# Patient Record
Sex: Female | Born: 1945
Health system: Southern US, Community
[De-identification: ages and names within clinical notes are randomized; demographics above are authoritative.]

## PROBLEM LIST (undated history)

## (undated) DIAGNOSIS — Z9889 Other specified postprocedural states: Secondary | ICD-10-CM

## (undated) DIAGNOSIS — Z923 Personal history of irradiation: Secondary | ICD-10-CM

## (undated) DIAGNOSIS — G4700 Insomnia, unspecified: Secondary | ICD-10-CM

## (undated) DIAGNOSIS — Z8489 Family history of other specified conditions: Secondary | ICD-10-CM

## (undated) DIAGNOSIS — K219 Gastro-esophageal reflux disease without esophagitis: Secondary | ICD-10-CM

## (undated) DIAGNOSIS — R7301 Impaired fasting glucose: Secondary | ICD-10-CM

## (undated) DIAGNOSIS — C50412 Malignant neoplasm of upper-outer quadrant of left female breast: Secondary | ICD-10-CM

## (undated) DIAGNOSIS — D351 Benign neoplasm of parathyroid gland: Secondary | ICD-10-CM

## (undated) DIAGNOSIS — Z720 Tobacco use: Secondary | ICD-10-CM

## (undated) DIAGNOSIS — Z1211 Encounter for screening for malignant neoplasm of colon: Secondary | ICD-10-CM

## (undated) DIAGNOSIS — IMO0002 Reserved for concepts with insufficient information to code with codable children: Secondary | ICD-10-CM

## (undated) DIAGNOSIS — M159 Polyosteoarthritis, unspecified: Secondary | ICD-10-CM

## (undated) DIAGNOSIS — F329 Major depressive disorder, single episode, unspecified: Secondary | ICD-10-CM

## (undated) DIAGNOSIS — K819 Cholecystitis, unspecified: Secondary | ICD-10-CM

## (undated) DIAGNOSIS — E785 Hyperlipidemia, unspecified: Secondary | ICD-10-CM

## (undated) DIAGNOSIS — Z780 Asymptomatic menopausal state: Secondary | ICD-10-CM

## (undated) DIAGNOSIS — C50919 Malignant neoplasm of unspecified site of unspecified female breast: Secondary | ICD-10-CM

## (undated) DIAGNOSIS — M75100 Unspecified rotator cuff tear or rupture of unspecified shoulder, not specified as traumatic: Secondary | ICD-10-CM

## (undated) DIAGNOSIS — F419 Anxiety disorder, unspecified: Secondary | ICD-10-CM

## (undated) DIAGNOSIS — I2510 Atherosclerotic heart disease of native coronary artery without angina pectoris: Secondary | ICD-10-CM

## (undated) DIAGNOSIS — M109 Gout, unspecified: Secondary | ICD-10-CM

## (undated) DIAGNOSIS — M810 Age-related osteoporosis without current pathological fracture: Secondary | ICD-10-CM

## (undated) DIAGNOSIS — N189 Chronic kidney disease, unspecified: Secondary | ICD-10-CM

## (undated) DIAGNOSIS — I10 Essential (primary) hypertension: Secondary | ICD-10-CM

## (undated) DIAGNOSIS — C801 Malignant (primary) neoplasm, unspecified: Secondary | ICD-10-CM

## (undated) DIAGNOSIS — H269 Unspecified cataract: Secondary | ICD-10-CM

## (undated) DIAGNOSIS — Z87891 Personal history of nicotine dependence: Principal | ICD-10-CM

## (undated) DIAGNOSIS — I739 Peripheral vascular disease, unspecified: Secondary | ICD-10-CM

## (undated) DIAGNOSIS — F32A Depression, unspecified: Secondary | ICD-10-CM

## (undated) DIAGNOSIS — M1990 Unspecified osteoarthritis, unspecified site: Secondary | ICD-10-CM

## (undated) HISTORY — PX: SKIN BIOPSY: SHX1

## (undated) HISTORY — DX: Unspecified osteoarthritis, unspecified site: M19.90

## (undated) HISTORY — DX: Atherosclerotic heart disease of native coronary artery without angina pectoris: I25.10

## (undated) HISTORY — DX: Asymptomatic menopausal state: Z78.0

## (undated) HISTORY — DX: Chronic kidney disease, unspecified: N18.9

## (undated) HISTORY — DX: Reserved for concepts with insufficient information to code with codable children: IMO0002

## (undated) HISTORY — DX: Unspecified cataract: H26.9

## (undated) HISTORY — PX: BREAST EXCISIONAL BIOPSY: SUR124

## (undated) HISTORY — DX: Cholecystitis, unspecified: K81.9

## (undated) HISTORY — DX: Age-related osteoporosis without current pathological fracture: M81.0

## (undated) HISTORY — PX: BREAST FIBROADENOMA SURGERY: SHX580

## (undated) HISTORY — DX: Peripheral vascular disease, unspecified: I73.9

## (undated) HISTORY — DX: Gastro-esophageal reflux disease without esophagitis: K21.9

## (undated) HISTORY — PX: BREAST LUMPECTOMY: SHX2

## (undated) HISTORY — DX: Hyperlipidemia, unspecified: E78.5

## (undated) HISTORY — PX: ILIAC ARTERY STENT: SHX1786

## (undated) HISTORY — DX: Encounter for screening for malignant neoplasm of colon: Z12.11

## (undated) HISTORY — DX: Insomnia, unspecified: G47.00

## (undated) HISTORY — DX: Malignant neoplasm of upper-outer quadrant of left female breast: C50.412

## (undated) HISTORY — DX: Polyosteoarthritis, unspecified: M15.9

## (undated) HISTORY — DX: Anxiety disorder, unspecified: F41.9

## (undated) HISTORY — DX: Depression, unspecified: F32.A

## (undated) HISTORY — DX: Unspecified rotator cuff tear or rupture of unspecified shoulder, not specified as traumatic: M75.100

## (undated) HISTORY — DX: Tobacco use: Z72.0

## (undated) HISTORY — DX: Major depressive disorder, single episode, unspecified: F32.9

## (undated) HISTORY — DX: Other specified postprocedural states: Z98.890

## (undated) HISTORY — DX: Essential (primary) hypertension: I10

## (undated) HISTORY — DX: Gout, unspecified: M10.9

## (undated) HISTORY — DX: Benign neoplasm of parathyroid gland: D35.1

## (undated) HISTORY — DX: Personal history of nicotine dependence: Z87.891

## (undated) HISTORY — DX: Impaired fasting glucose: R73.01

---
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
## 2003-10-26 ENCOUNTER — Inpatient Hospital Stay (HOSPITAL_COMMUNITY): Admission: EM | Admit: 2003-10-26 | Discharge: 2003-10-28 | Payer: Self-pay | Admitting: Emergency Medicine

## 2007-09-16 ENCOUNTER — Other Ambulatory Visit: Payer: Self-pay

## 2007-09-17 ENCOUNTER — Inpatient Hospital Stay: Payer: Self-pay | Admitting: Internal Medicine

## 2007-09-17 HISTORY — PX: CARDIAC CATHETERIZATION: SHX172

## 2008-08-26 ENCOUNTER — Ambulatory Visit: Payer: Self-pay | Admitting: Nephrology

## 2009-12-24 ENCOUNTER — Ambulatory Visit: Payer: Self-pay | Admitting: Family Medicine

## 2009-12-24 ENCOUNTER — Emergency Department: Payer: Self-pay | Admitting: Emergency Medicine

## 2010-04-14 ENCOUNTER — Ambulatory Visit: Payer: Self-pay | Admitting: Internal Medicine

## 2010-06-08 ENCOUNTER — Ambulatory Visit: Payer: Self-pay | Admitting: Nephrology

## 2010-08-28 HISTORY — PX: PARATHYROIDECTOMY: SHX19

## 2010-09-07 ENCOUNTER — Encounter: Payer: Self-pay | Admitting: Gastroenterology

## 2010-09-29 NOTE — Letter (Signed)
Summary: Colonoscopy Letter  [facility]  [address] [place], [place] [address]   Phone: [phone]  Fax: [phone]      September 07, 2010 MRN: [id number]   [facility] HALL [address] [place]  [address]   Dear Ms. HALL,   According to your medical record, it is time for you to schedule a Colonoscopy. The American Cancer Society recommends this procedure as a method to detect early colon cancer. Patients with a family history of colon cancer, or a personal history of colon polyps or inflammatory bowel disease are at increased risk.  This letter has been generated based on the recommendations made at the time of your procedure. If you feel that in your particular situation this may no longer apply, please contact our office.  Please call our office at [phone] to schedule this appointment or to update your records at your earliest convenience.  Thank you for cooperating with [place] to provide you with the very best care possible.   Sincerely,  Judie Petit T. Russella Dar, M.D.  [facility] Gastro[facility]ology Division [phone]

## 2010-12-16 ENCOUNTER — Ambulatory Visit: Payer: Self-pay | Admitting: Nephrology

## 2011-12-19 ENCOUNTER — Ambulatory Visit: Payer: Self-pay | Admitting: Family Medicine

## 2012-05-13 ENCOUNTER — Encounter: Payer: Self-pay | Admitting: Gastroenterology

## 2012-08-28 HISTORY — PX: BREAST BIOPSY: SHX20

## 2012-10-03 ENCOUNTER — Emergency Department: Payer: Self-pay | Admitting: Emergency Medicine

## 2012-10-03 LAB — CK TOTAL AND CKMB (NOT AT ARMC): CK-MB: 1.5 ng/mL (ref 0.5–3.6)

## 2012-10-03 LAB — BASIC METABOLIC PANEL
Anion Gap: 7 (ref 7–16)
BUN: 13 mg/dL (ref 7–18)
Creatinine: 1.07 mg/dL (ref 0.60–1.30)
EGFR (Non-African Amer.): 54 — ABNORMAL LOW
Glucose: 106 mg/dL — ABNORMAL HIGH (ref 65–99)
Potassium: 3.6 mmol/L (ref 3.5–5.1)
Sodium: 142 mmol/L (ref 136–145)

## 2012-10-03 LAB — LIPASE, BLOOD: Lipase: 187 U/L (ref 73–393)

## 2012-10-03 LAB — TROPONIN I: Troponin-I: <0.02 ng/mL

## 2012-10-03 LAB — CBC
HCT: 40.1 % (ref 35.0–47.0)
MCHC: 33.4 g/dL (ref 32.0–36.0)
RDW: 14.2 % (ref 11.5–14.5)

## 2012-10-03 LAB — HEPATIC FUNCTION PANEL A (ARMC)
Bilirubin, Direct: 0.1 mg/dL (ref 0.00–0.20)
Total Protein: 7.5 g/dL (ref 6.4–8.2)

## 2012-10-09 ENCOUNTER — Ambulatory Visit: Payer: Self-pay | Admitting: Family Medicine

## 2012-11-26 DIAGNOSIS — Z9889 Other specified postprocedural states: Secondary | ICD-10-CM

## 2012-11-26 HISTORY — DX: Other specified postprocedural states: Z98.890

## 2012-12-02 ENCOUNTER — Ambulatory Visit: Payer: Self-pay | Admitting: Gastroenterology

## 2012-12-06 ENCOUNTER — Ambulatory Visit: Payer: Self-pay | Admitting: Gastroenterology

## 2013-01-17 ENCOUNTER — Ambulatory Visit: Payer: Self-pay | Admitting: Gastroenterology

## 2013-01-17 LAB — CREATININE, SERUM
Creatinine: 1.08 mg/dL (ref 0.60–1.30)
EGFR (African American): >60
EGFR (Non-African Amer.): 54 — ABNORMAL LOW

## 2013-01-20 ENCOUNTER — Emergency Department: Payer: Self-pay | Admitting: Emergency Medicine

## 2013-01-20 LAB — CBC
HCT: 38.7 % (ref 35.0–47.0)
MCH: 30.7 pg (ref 26.0–34.0)
MCV: 91 fL (ref 80–100)
Platelet: 157 10*3/uL (ref 150–440)
RBC: 4.27 10*6/uL (ref 3.80–5.20)
WBC: 5.2 10*3/uL (ref 3.6–11.0)

## 2013-01-20 LAB — BASIC METABOLIC PANEL
Chloride: 109 mmol/L — ABNORMAL HIGH (ref 98–107)
Co2: 24 mmol/L (ref 21–32)
Creatinine: 1.05 mg/dL (ref 0.60–1.30)
EGFR (African American): >60
Sodium: 141 mmol/L (ref 136–145)

## 2013-01-20 LAB — CK TOTAL AND CKMB (NOT AT ARMC)
CK, Total: 106 U/L (ref 21–215)
CK-MB: 0.9 ng/mL (ref 0.5–3.6)

## 2013-01-20 LAB — TROPONIN I: Troponin-I: <0.02 ng/mL

## 2013-01-22 ENCOUNTER — Telehealth: Payer: Self-pay

## 2013-01-22 ENCOUNTER — Encounter: Payer: Self-pay | Admitting: Cardiovascular Disease

## 2013-01-22 ENCOUNTER — Ambulatory Visit (INDEPENDENT_AMBULATORY_CARE_PROVIDER_SITE_OTHER): Payer: Medicare Other | Admitting: Cardiovascular Disease

## 2013-01-22 VITALS — BP 140/70 | HR 64 | Ht 60.0 in | Wt 138.2 lb

## 2013-01-22 DIAGNOSIS — R079 Chest pain, unspecified: Secondary | ICD-10-CM

## 2013-01-22 DIAGNOSIS — M25512 Pain in left shoulder: Secondary | ICD-10-CM

## 2013-01-22 DIAGNOSIS — M25519 Pain in unspecified shoulder: Secondary | ICD-10-CM

## 2013-01-22 DIAGNOSIS — A048 Other specified bacterial intestinal infections: Secondary | ICD-10-CM

## 2013-01-22 DIAGNOSIS — I739 Peripheral vascular disease, unspecified: Secondary | ICD-10-CM

## 2013-01-22 DIAGNOSIS — K219 Gastro-esophageal reflux disease without esophagitis: Secondary | ICD-10-CM

## 2013-01-22 DIAGNOSIS — R1013 Epigastric pain: Secondary | ICD-10-CM

## 2013-01-22 DIAGNOSIS — I2510 Atherosclerotic heart disease of native coronary artery without angina pectoris: Secondary | ICD-10-CM

## 2013-01-22 MED ORDER — RANITIDINE HCL 150 MG PO TABS: 300.0000 mg | ORAL_TABLET | Freq: Two times a day (BID) | ORAL | Status: DC

## 2013-01-22 NOTE — Assessment & Plan Note (Signed)
She'll try extra H2 blockers in addition to proton pump inhibitors

## 2013-01-22 NOTE — Assessment & Plan Note (Signed)
Recent episode of chest burning with negative workup. Cardiac catheterization several years ago showing only to 50% LAD disease. Symptoms are atypical. No further workup at this time

## 2013-01-22 NOTE — Assessment & Plan Note (Signed)
Previous iliac artery stent. Nonsmoker, currently on a statin. We have suggested she do a short trial period holding aspirin and statin to see if her epigastric discomfort improves, if no improvement, restart medications. Given her PVD, this does raise the concern of mesenteric ischemia as a cause of her symptoms.

## 2013-01-22 NOTE — Assessment & Plan Note (Signed)
She reports remote history of H. Pylori positive. We'll recheck today. If positive, may benefit from treatment despite lack of dramatic pathology given her persistent symptoms.

## 2013-01-22 NOTE — Progress Notes (Signed)
Patient ID: RIA REDCAY, female    DOB: 08-20-1946, 67 y.o.   MRN: [id number]  HPI Comments: Marie Parker is a very pleasant 67 year old woman with long history of GERD, previously evaluated by GI, status post EGD, recent evaluation in the emergency room x2 over the past 2 months for chest pain symptoms who presents today for evaluation. She has a long history of smoking and stopped 12 years ago Notes indicate stent to the right iliac artery in 1993, 1997 Cardiac catheterization in 2009 showing 40% LAD disease  She was first treated to the emergency room one month ago, she had a stabbing in her back radiating through her to her xiphoid region. Workup was essentially negative Second episode several days ago, she presented to the emergency room with burning from one shoulder to the other shoulder. Workup again essentially negative  She has chronic episodes of burning from her xiphoid region extending to the right typically presenting after eating 30-45 minutes later. Symptoms can persist for some time. She has tried several proton pump inhibitors with moderate success. H2 blockers might help her symptoms. She has had EGD, CT scan, gallbladder nuclear scan. Recently given levsin which she has taken on several occasions with no significant improvement of her symptoms. She does report having positive H. pylori and for this reason she was sent to GI for workup. No antibiotics were given.  Total cholesterol 130, LDL 50, HDL 42  EKG shows normal sinus rhythm with rate 64 beats per minute, left bundle branch block    Outpatient Encounter Prescriptions as of 01/22/2013  Medication Sig Dispense Refill  . amLODipine (NORVASC) 10 MG tablet Take 10 mg by mouth daily.      [name] aspirin 81 MG tablet Take 81 mg by mouth daily.      [name] atenolol (TENORMIN) 50 MG tablet Take 50 mg by mouth daily.      [name] atorvastatin (LIPITOR) 40 MG tablet Take 40 mg by mouth daily.      . benazepril (LOTENSIN) 40 MG tablet  Take 40 mg by mouth daily.      . clonazePAM (KLONOPIN) 0.5 MG tablet Take 0.5 mg by mouth daily as needed for anxiety.      . colchicine 0.6 MG tablet Take 0.6 mg by mouth as needed.       . fluticasone (FLONASE) 50 MCG/ACT nasal spray Place 2 sprays into the nose daily.      . hydrALAZINE (APRESOLINE) 25 MG tablet Take 25 mg by mouth 2 (two) times daily.      [name] NIACIN PO Take 1,000 mg by mouth daily.      . pantoprazole (PROTONIX) 20 MG tablet 2 (two) times daily.       .  ranitidine (ZANTAC) 150 MG tablet at bedtime.         Review of Systems  Constitutional: Negative.   HENT: Negative.   Eyes: Negative.   Respiratory: Negative.   Cardiovascular: Negative.   Gastrointestinal: Negative.        Abdominal burning after eating  Musculoskeletal: Negative.   Skin: Negative.   Neurological: Negative.   Psychiatric/Behavioral: Negative.   All other systems reviewed and are negative.    BP 140/70  Pulse 64  Ht 5' (1.524 m)  Wt 138 lb 4 oz (62.71 kg)  BMI 27 kg/m2  Physical Exam  Nursing note and vitals reviewed. Constitutional: She is oriented to person, place, and time. She appears well-developed and well-nourished.  HENT:  Head: Normocephalic.  Nose: Nose normal.  Mouth/Throat: Oropharynx is clear and moist.  Eyes: Conjunctivae are normal. Pupils are equal, round, and reactive to light.  Neck: Normal range of motion. Neck supple. No JVD present.  Cardiovascular: Normal rate, regular rhythm, S1 normal, S2 normal, normal heart sounds and intact distal pulses.  Exam reveals no gallop and no friction rub.   No murmur heard. Pulmonary/Chest: Effort normal and breath sounds normal. No respiratory distress. She has no wheezes. She has no rales. She exhibits no tenderness.  Abdominal: Soft. Bowel sounds are normal. She exhibits no distension. There is no tenderness.  Musculoskeletal: Normal range of motion. She exhibits no edema and no tenderness.  Lymphadenopathy:    She has no  cervical adenopathy.  Neurological: She is alert and oriented to person, place, and time. Coordination normal.  Skin: Skin is warm and dry. No rash noted. No erythema.  Psychiatric: She has a normal mood and affect. Her behavior is normal. Judgment and thought content normal.    Assessment and Plan

## 2013-01-22 NOTE — Assessment & Plan Note (Signed)
Cardiac catheterization in 2009 showing 40-50% mid LAD disease. Good exercise tolerance

## 2013-01-22 NOTE — Telephone Encounter (Signed)
Pt was seen in ER "Schedule pt for office visit to discuss CP symptoms" V.O Dr. Alvis Lemmings, RN, BSN

## 2013-01-22 NOTE — Patient Instructions (Addendum)
Please increase the zantac to 300 mg twice a day  Continue on the protonix  We will check the H Pylori today We will call you with the result.   No further heart studies are needed Hold the aspirin until stomach is better  Please call us if you have new issues that need to be addressed before your next appt.

## 2013-01-22 NOTE — Assessment & Plan Note (Signed)
Most of her visit was spent discussing her epigastric burning pain. Some relief with Zantac which is not taking on a regular basis. I suggested she take her Zantac on a regular basis twice a day in addition to her PPI. H. pylori will be rechecked as she reports this was positive in the past. If positive, uncertain if he should be treated given her persistent symptoms.  Given her peripheral vascular disease, significant concern for mesenteric ischemia. If no improvement of her symptoms, we will schedule her for mesenteric arterial ultrasound which could be done through our office.

## 2013-01-24 ENCOUNTER — Telehealth: Payer: Self-pay

## 2013-01-24 NOTE — Telephone Encounter (Signed)
Pt  Needs lab results

## 2013-01-24 NOTE — Telephone Encounter (Signed)
Please review

## 2013-01-28 NOTE — Telephone Encounter (Signed)
Please inform pt

## 2013-01-28 NOTE — Telephone Encounter (Signed)
H. pylori negative Perhaps we can send this to her primary care physician

## 2013-01-29 NOTE — Telephone Encounter (Signed)
LMOM TCB regarding lab work.

## 2013-02-03 ENCOUNTER — Telehealth: Payer: Self-pay

## 2013-02-03 NOTE — Telephone Encounter (Signed)
Pt is returning your call regarding her labs 

## 2013-05-28 ENCOUNTER — Ambulatory Visit: Payer: Self-pay | Admitting: Family Medicine

## 2013-05-31 DIAGNOSIS — R1030 Lower abdominal pain, unspecified: Secondary | ICD-10-CM | POA: Insufficient documentation

## 2013-05-31 DIAGNOSIS — R109 Unspecified abdominal pain: Secondary | ICD-10-CM | POA: Insufficient documentation

## 2013-06-12 ENCOUNTER — Ambulatory Visit: Payer: Self-pay | Admitting: Family Medicine

## 2013-06-17 ENCOUNTER — Ambulatory Visit: Payer: Self-pay | Admitting: Family Medicine

## 2013-06-18 LAB — PATHOLOGY REPORT

## 2014-09-01 DIAGNOSIS — M9906 Segmental and somatic dysfunction of lower extremity: Secondary | ICD-10-CM | POA: Diagnosis not present

## 2014-09-01 DIAGNOSIS — M9905 Segmental and somatic dysfunction of pelvic region: Secondary | ICD-10-CM | POA: Diagnosis not present

## 2014-09-01 DIAGNOSIS — M9904 Segmental and somatic dysfunction of sacral region: Secondary | ICD-10-CM | POA: Diagnosis not present

## 2014-09-01 DIAGNOSIS — M25551 Pain in right hip: Secondary | ICD-10-CM | POA: Diagnosis not present

## 2014-09-03 DIAGNOSIS — E780 Pure hypercholesterolemia: Secondary | ICD-10-CM | POA: Diagnosis not present

## 2014-09-03 DIAGNOSIS — N183 Chronic kidney disease, stage 3 (moderate): Secondary | ICD-10-CM | POA: Diagnosis not present

## 2014-09-03 DIAGNOSIS — I10 Essential (primary) hypertension: Secondary | ICD-10-CM | POA: Diagnosis not present

## 2014-09-09 ENCOUNTER — Ambulatory Visit: Payer: Self-pay | Admitting: Family Medicine

## 2014-09-09 DIAGNOSIS — M8589 Other specified disorders of bone density and structure, multiple sites: Secondary | ICD-10-CM | POA: Diagnosis not present

## 2014-09-09 DIAGNOSIS — M8580 Other specified disorders of bone density and structure, unspecified site: Secondary | ICD-10-CM | POA: Diagnosis not present

## 2014-09-09 DIAGNOSIS — Z1231 Encounter for screening mammogram for malignant neoplasm of breast: Secondary | ICD-10-CM | POA: Diagnosis not present

## 2014-09-09 DIAGNOSIS — Z13820 Encounter for screening for osteoporosis: Secondary | ICD-10-CM | POA: Diagnosis not present

## 2014-09-10 ENCOUNTER — Ambulatory Visit: Payer: Self-pay | Admitting: Nephrology

## 2014-09-10 DIAGNOSIS — N183 Chronic kidney disease, stage 3 (moderate): Secondary | ICD-10-CM | POA: Diagnosis not present

## 2014-09-10 DIAGNOSIS — N281 Cyst of kidney, acquired: Secondary | ICD-10-CM | POA: Diagnosis not present

## 2014-09-10 DIAGNOSIS — N2889 Other specified disorders of kidney and ureter: Secondary | ICD-10-CM | POA: Diagnosis not present

## 2014-09-16 ENCOUNTER — Ambulatory Visit: Payer: Self-pay | Admitting: Family Medicine

## 2014-09-16 DIAGNOSIS — M25551 Pain in right hip: Secondary | ICD-10-CM | POA: Diagnosis not present

## 2014-09-23 ENCOUNTER — Ambulatory Visit: Payer: Self-pay | Admitting: Family Medicine

## 2014-09-23 DIAGNOSIS — J329 Chronic sinusitis, unspecified: Secondary | ICD-10-CM | POA: Diagnosis not present

## 2014-09-23 DIAGNOSIS — E780 Pure hypercholesterolemia: Secondary | ICD-10-CM | POA: Diagnosis not present

## 2014-09-24 DIAGNOSIS — H10013 Acute follicular conjunctivitis, bilateral: Secondary | ICD-10-CM | POA: Diagnosis not present

## 2014-09-24 DIAGNOSIS — H2513 Age-related nuclear cataract, bilateral: Secondary | ICD-10-CM | POA: Diagnosis not present

## 2014-09-28 DIAGNOSIS — B019 Varicella without complication: Secondary | ICD-10-CM | POA: Diagnosis not present

## 2014-09-28 DIAGNOSIS — R05 Cough: Secondary | ICD-10-CM | POA: Diagnosis not present

## 2014-09-28 DIAGNOSIS — M545 Low back pain: Secondary | ICD-10-CM | POA: Diagnosis not present

## 2014-10-22 DIAGNOSIS — E780 Pure hypercholesterolemia: Secondary | ICD-10-CM | POA: Diagnosis not present

## 2014-10-22 DIAGNOSIS — I10 Essential (primary) hypertension: Secondary | ICD-10-CM | POA: Diagnosis not present

## 2014-10-22 DIAGNOSIS — N183 Chronic kidney disease, stage 3 (moderate): Secondary | ICD-10-CM | POA: Diagnosis not present

## 2014-10-27 DIAGNOSIS — M2550 Pain in unspecified joint: Secondary | ICD-10-CM | POA: Diagnosis not present

## 2014-10-27 DIAGNOSIS — N760 Acute vaginitis: Secondary | ICD-10-CM | POA: Diagnosis not present

## 2014-10-27 DIAGNOSIS — B372 Candidiasis of skin and nail: Secondary | ICD-10-CM | POA: Diagnosis not present

## 2014-10-27 DIAGNOSIS — R300 Dysuria: Secondary | ICD-10-CM | POA: Diagnosis not present

## 2014-10-27 DIAGNOSIS — J069 Acute upper respiratory infection, unspecified: Secondary | ICD-10-CM | POA: Diagnosis not present

## 2014-12-09 DIAGNOSIS — Z08 Encounter for follow-up examination after completed treatment for malignant neoplasm: Secondary | ICD-10-CM | POA: Diagnosis not present

## 2014-12-09 DIAGNOSIS — L853 Xerosis cutis: Secondary | ICD-10-CM | POA: Diagnosis not present

## 2014-12-09 DIAGNOSIS — L821 Other seborrheic keratosis: Secondary | ICD-10-CM | POA: Diagnosis not present

## 2014-12-09 DIAGNOSIS — Z85828 Personal history of other malignant neoplasm of skin: Secondary | ICD-10-CM | POA: Diagnosis not present

## 2014-12-09 DIAGNOSIS — Z1283 Encounter for screening for malignant neoplasm of skin: Secondary | ICD-10-CM | POA: Diagnosis not present

## 2014-12-11 DIAGNOSIS — E785 Hyperlipidemia, unspecified: Secondary | ICD-10-CM | POA: Diagnosis not present

## 2014-12-11 DIAGNOSIS — Z23 Encounter for immunization: Secondary | ICD-10-CM | POA: Diagnosis not present

## 2014-12-11 DIAGNOSIS — F419 Anxiety disorder, unspecified: Secondary | ICD-10-CM | POA: Diagnosis not present

## 2014-12-11 DIAGNOSIS — N183 Chronic kidney disease, stage 3 (moderate): Secondary | ICD-10-CM | POA: Diagnosis not present

## 2014-12-11 DIAGNOSIS — I129 Hypertensive chronic kidney disease with stage 1 through stage 4 chronic kidney disease, or unspecified chronic kidney disease: Secondary | ICD-10-CM | POA: Diagnosis not present

## 2014-12-11 DIAGNOSIS — M109 Gout, unspecified: Secondary | ICD-10-CM | POA: Diagnosis not present

## 2014-12-11 DIAGNOSIS — Z113 Encounter for screening for infections with a predominantly sexual mode of transmission: Secondary | ICD-10-CM | POA: Diagnosis not present

## 2014-12-18 ENCOUNTER — Other Ambulatory Visit: Payer: Self-pay | Admitting: Family Medicine

## 2014-12-18 DIAGNOSIS — I10 Essential (primary) hypertension: Secondary | ICD-10-CM

## 2014-12-18 DIAGNOSIS — IMO0001 Reserved for inherently not codable concepts without codable children: Secondary | ICD-10-CM

## 2015-01-05 ENCOUNTER — Ambulatory Visit
Admission: RE | Admit: 2015-01-05 | Discharge: 2015-01-05 | Disposition: A | Discharge status: Home / Self Care | Payer: Medicare Other | Source: Ambulatory Visit | Attending: Family Medicine | Admitting: Family Medicine

## 2015-01-05 DIAGNOSIS — N281 Cyst of kidney, acquired: Secondary | ICD-10-CM | POA: Diagnosis not present

## 2015-01-05 DIAGNOSIS — I10 Essential (primary) hypertension: Secondary | ICD-10-CM | POA: Insufficient documentation

## 2015-01-05 DIAGNOSIS — Z8249 Family history of ischemic heart disease and other diseases of the circulatory system: Secondary | ICD-10-CM | POA: Insufficient documentation

## 2015-01-05 DIAGNOSIS — I77811 Abdominal aortic ectasia: Secondary | ICD-10-CM | POA: Diagnosis not present

## 2015-01-05 DIAGNOSIS — IMO0001 Reserved for inherently not codable concepts without codable children: Secondary | ICD-10-CM

## 2015-01-13 DIAGNOSIS — M461 Sacroiliitis, not elsewhere classified: Secondary | ICD-10-CM | POA: Diagnosis not present

## 2015-01-13 DIAGNOSIS — M9905 Segmental and somatic dysfunction of pelvic region: Secondary | ICD-10-CM | POA: Diagnosis not present

## 2015-01-13 DIAGNOSIS — M531 Cervicobrachial syndrome: Secondary | ICD-10-CM | POA: Diagnosis not present

## 2015-01-13 DIAGNOSIS — M5441 Lumbago with sciatica, right side: Secondary | ICD-10-CM | POA: Diagnosis not present

## 2015-01-13 DIAGNOSIS — M9901 Segmental and somatic dysfunction of cervical region: Secondary | ICD-10-CM | POA: Diagnosis not present

## 2015-01-15 DIAGNOSIS — M461 Sacroiliitis, not elsewhere classified: Secondary | ICD-10-CM | POA: Diagnosis not present

## 2015-01-15 DIAGNOSIS — M5441 Lumbago with sciatica, right side: Secondary | ICD-10-CM | POA: Diagnosis not present

## 2015-01-15 DIAGNOSIS — M9905 Segmental and somatic dysfunction of pelvic region: Secondary | ICD-10-CM | POA: Diagnosis not present

## 2015-01-15 DIAGNOSIS — M9901 Segmental and somatic dysfunction of cervical region: Secondary | ICD-10-CM | POA: Diagnosis not present

## 2015-01-15 DIAGNOSIS — M531 Cervicobrachial syndrome: Secondary | ICD-10-CM | POA: Diagnosis not present

## 2015-01-18 DIAGNOSIS — M461 Sacroiliitis, not elsewhere classified: Secondary | ICD-10-CM | POA: Diagnosis not present

## 2015-01-18 DIAGNOSIS — M531 Cervicobrachial syndrome: Secondary | ICD-10-CM | POA: Diagnosis not present

## 2015-01-18 DIAGNOSIS — M5441 Lumbago with sciatica, right side: Secondary | ICD-10-CM | POA: Diagnosis not present

## 2015-01-18 DIAGNOSIS — M9901 Segmental and somatic dysfunction of cervical region: Secondary | ICD-10-CM | POA: Diagnosis not present

## 2015-01-18 DIAGNOSIS — M9905 Segmental and somatic dysfunction of pelvic region: Secondary | ICD-10-CM | POA: Diagnosis not present

## 2015-01-19 DIAGNOSIS — J309 Allergic rhinitis, unspecified: Secondary | ICD-10-CM | POA: Diagnosis not present

## 2015-01-19 DIAGNOSIS — R51 Headache: Secondary | ICD-10-CM | POA: Diagnosis not present

## 2015-01-20 DIAGNOSIS — M9905 Segmental and somatic dysfunction of pelvic region: Secondary | ICD-10-CM | POA: Diagnosis not present

## 2015-01-20 DIAGNOSIS — M5441 Lumbago with sciatica, right side: Secondary | ICD-10-CM | POA: Diagnosis not present

## 2015-01-20 DIAGNOSIS — M531 Cervicobrachial syndrome: Secondary | ICD-10-CM | POA: Diagnosis not present

## 2015-01-20 DIAGNOSIS — M9901 Segmental and somatic dysfunction of cervical region: Secondary | ICD-10-CM | POA: Diagnosis not present

## 2015-01-20 DIAGNOSIS — M461 Sacroiliitis, not elsewhere classified: Secondary | ICD-10-CM | POA: Diagnosis not present

## 2015-01-26 DIAGNOSIS — M9905 Segmental and somatic dysfunction of pelvic region: Secondary | ICD-10-CM | POA: Diagnosis not present

## 2015-01-26 DIAGNOSIS — M531 Cervicobrachial syndrome: Secondary | ICD-10-CM | POA: Diagnosis not present

## 2015-01-26 DIAGNOSIS — M5441 Lumbago with sciatica, right side: Secondary | ICD-10-CM | POA: Diagnosis not present

## 2015-01-26 DIAGNOSIS — M9901 Segmental and somatic dysfunction of cervical region: Secondary | ICD-10-CM | POA: Diagnosis not present

## 2015-01-26 DIAGNOSIS — M461 Sacroiliitis, not elsewhere classified: Secondary | ICD-10-CM | POA: Diagnosis not present

## 2015-01-28 DIAGNOSIS — M9905 Segmental and somatic dysfunction of pelvic region: Secondary | ICD-10-CM | POA: Diagnosis not present

## 2015-01-28 DIAGNOSIS — M5441 Lumbago with sciatica, right side: Secondary | ICD-10-CM | POA: Diagnosis not present

## 2015-01-28 DIAGNOSIS — M531 Cervicobrachial syndrome: Secondary | ICD-10-CM | POA: Diagnosis not present

## 2015-01-28 DIAGNOSIS — M461 Sacroiliitis, not elsewhere classified: Secondary | ICD-10-CM | POA: Diagnosis not present

## 2015-01-28 DIAGNOSIS — M9901 Segmental and somatic dysfunction of cervical region: Secondary | ICD-10-CM | POA: Diagnosis not present

## 2015-02-09 DIAGNOSIS — M9905 Segmental and somatic dysfunction of pelvic region: Secondary | ICD-10-CM | POA: Diagnosis not present

## 2015-02-09 DIAGNOSIS — M461 Sacroiliitis, not elsewhere classified: Secondary | ICD-10-CM | POA: Diagnosis not present

## 2015-02-09 DIAGNOSIS — M5441 Lumbago with sciatica, right side: Secondary | ICD-10-CM | POA: Diagnosis not present

## 2015-02-09 DIAGNOSIS — M531 Cervicobrachial syndrome: Secondary | ICD-10-CM | POA: Diagnosis not present

## 2015-02-09 DIAGNOSIS — M9901 Segmental and somatic dysfunction of cervical region: Secondary | ICD-10-CM | POA: Diagnosis not present

## 2015-02-12 DIAGNOSIS — M531 Cervicobrachial syndrome: Secondary | ICD-10-CM | POA: Diagnosis not present

## 2015-02-12 DIAGNOSIS — M9905 Segmental and somatic dysfunction of pelvic region: Secondary | ICD-10-CM | POA: Diagnosis not present

## 2015-02-12 DIAGNOSIS — M5441 Lumbago with sciatica, right side: Secondary | ICD-10-CM | POA: Diagnosis not present

## 2015-02-12 DIAGNOSIS — M9901 Segmental and somatic dysfunction of cervical region: Secondary | ICD-10-CM | POA: Diagnosis not present

## 2015-02-12 DIAGNOSIS — M461 Sacroiliitis, not elsewhere classified: Secondary | ICD-10-CM | POA: Diagnosis not present

## 2015-02-16 DIAGNOSIS — M531 Cervicobrachial syndrome: Secondary | ICD-10-CM | POA: Diagnosis not present

## 2015-02-16 DIAGNOSIS — M9905 Segmental and somatic dysfunction of pelvic region: Secondary | ICD-10-CM | POA: Diagnosis not present

## 2015-02-16 DIAGNOSIS — M5441 Lumbago with sciatica, right side: Secondary | ICD-10-CM | POA: Diagnosis not present

## 2015-02-16 DIAGNOSIS — M9901 Segmental and somatic dysfunction of cervical region: Secondary | ICD-10-CM | POA: Diagnosis not present

## 2015-02-16 DIAGNOSIS — M461 Sacroiliitis, not elsewhere classified: Secondary | ICD-10-CM | POA: Diagnosis not present

## 2015-02-19 DIAGNOSIS — M9901 Segmental and somatic dysfunction of cervical region: Secondary | ICD-10-CM | POA: Diagnosis not present

## 2015-02-19 DIAGNOSIS — M461 Sacroiliitis, not elsewhere classified: Secondary | ICD-10-CM | POA: Diagnosis not present

## 2015-02-19 DIAGNOSIS — M5441 Lumbago with sciatica, right side: Secondary | ICD-10-CM | POA: Diagnosis not present

## 2015-02-19 DIAGNOSIS — M531 Cervicobrachial syndrome: Secondary | ICD-10-CM | POA: Diagnosis not present

## 2015-02-19 DIAGNOSIS — M9905 Segmental and somatic dysfunction of pelvic region: Secondary | ICD-10-CM | POA: Diagnosis not present

## 2015-02-23 DIAGNOSIS — M9901 Segmental and somatic dysfunction of cervical region: Secondary | ICD-10-CM | POA: Diagnosis not present

## 2015-02-23 DIAGNOSIS — M9905 Segmental and somatic dysfunction of pelvic region: Secondary | ICD-10-CM | POA: Diagnosis not present

## 2015-02-23 DIAGNOSIS — M5441 Lumbago with sciatica, right side: Secondary | ICD-10-CM | POA: Diagnosis not present

## 2015-02-23 DIAGNOSIS — M531 Cervicobrachial syndrome: Secondary | ICD-10-CM | POA: Diagnosis not present

## 2015-02-23 DIAGNOSIS — M461 Sacroiliitis, not elsewhere classified: Secondary | ICD-10-CM | POA: Diagnosis not present

## 2015-03-03 ENCOUNTER — Other Ambulatory Visit: Payer: Self-pay | Admitting: Family Medicine

## 2015-03-03 DIAGNOSIS — Z87891 Personal history of nicotine dependence: Secondary | ICD-10-CM

## 2015-03-05 ENCOUNTER — Inpatient Hospital Stay: Payer: Medicare Other | Attending: Family Medicine | Admitting: Family Medicine

## 2015-03-05 ENCOUNTER — Ambulatory Visit
Admission: RE | Admit: 2015-03-05 | Discharge: 2015-03-05 | Disposition: A | Discharge status: Home / Self Care | Payer: Medicare Other | Source: Ambulatory Visit | Attending: Family Medicine | Admitting: Family Medicine

## 2015-03-05 ENCOUNTER — Telehealth: Payer: Self-pay | Admitting: Family Medicine

## 2015-03-05 DIAGNOSIS — F17210 Nicotine dependence, cigarettes, uncomplicated: Secondary | ICD-10-CM | POA: Diagnosis not present

## 2015-03-05 DIAGNOSIS — Z87891 Personal history of nicotine dependence: Secondary | ICD-10-CM

## 2015-03-05 DIAGNOSIS — M531 Cervicobrachial syndrome: Secondary | ICD-10-CM | POA: Diagnosis not present

## 2015-03-05 DIAGNOSIS — M5441 Lumbago with sciatica, right side: Secondary | ICD-10-CM | POA: Diagnosis not present

## 2015-03-05 DIAGNOSIS — I2510 Atherosclerotic heart disease of native coronary artery without angina pectoris: Secondary | ICD-10-CM | POA: Insufficient documentation

## 2015-03-05 DIAGNOSIS — Z122 Encounter for screening for malignant neoplasm of respiratory organs: Secondary | ICD-10-CM

## 2015-03-05 DIAGNOSIS — M461 Sacroiliitis, not elsewhere classified: Secondary | ICD-10-CM | POA: Diagnosis not present

## 2015-03-05 DIAGNOSIS — M9901 Segmental and somatic dysfunction of cervical region: Secondary | ICD-10-CM | POA: Diagnosis not present

## 2015-03-05 DIAGNOSIS — M9905 Segmental and somatic dysfunction of pelvic region: Secondary | ICD-10-CM | POA: Diagnosis not present

## 2015-03-05 NOTE — Progress Notes (Signed)
In accordance with CMS guidelines, patient has meet eligibility criteria including age, absence of signs or symptoms of lung cancer, the specific calculation of cigarette smoking pack-years was 40 years and is a current smoker.   A shared decision-making session was conducted prior to the performance of CT scan. This includes one or more decision aids, includes benefits and harms of screening, follow-up diagnostic testing, over-diagnosis, false positive rate, and total radiation exposure.  Counseling on the importance of adherence to annual lung cancer LDCT screening, impact of co-morbidities, and ability or willingness to undergo diagnosis and treatment is imperative for compliance of the program.  Counseling on the importance of continued smoking cessation for former smokers; the importance of smoking cessation for current smokers and information about tobacco cessation interventions have been given to patient including the [place] at [facility], 1800 quit [place], as well as Cancer Center specific smoking cessation programs.  Written order for lung cancer screening with LDCT has been given to the patient and any and all questions have been answered to the best of my abilities.   Yearly follow up will be scheduled by Shawn Perkins, Thoracic Navigator.   

## 2015-03-05 NOTE — Telephone Encounter (Signed)
LMOM will call her back on Monday.

## 2015-03-08 ENCOUNTER — Telehealth: Payer: Self-pay | Admitting: *Deleted

## 2015-03-08 NOTE — Telephone Encounter (Signed)
  Oncology Nurse Navigator Documentation    Navigator Encounter Type: Screening;Telephone (03/08/15 1100)               Notified patient of LDCT lung cancer screening results of Lung Rads  3S finding with recommendation for 6 month follow up imaging. Also notified of incidental findings noted below. Patient reports she is aware of the coronary artery athersclerotic changes and is under the care of cardiology. Also reports the liver spot has been seen for quite some time. Will forward results to Dr. Wynetta Emery, who is aware and has attempted to contact patient regarding results. Will arrange for 6 month follow up imaging.   Lung-Rads category 3S, probably benign findings. Short-term follow-up in 6 months is recommended with repeat low-dose chest CT without contrast (please use the following order, "CT CHEST LUNG CA SCREEN LOW DOSE W/O CM"). 2. The "S" modifier above refers to potentially clinically significant non lung cancer related findings. Specifically, there is atherosclerosis, including 3 vessel coronary artery disease. Please note that although the presence of coronary artery calcium documents the presence of coronary artery disease, the severity of this disease and any potential stenosis cannot be assessed on this non-gated CT examination. Assessment for potential risk factor modification, dietary therapy or pharmacologic therapy may be warranted, if clinically indicated. 3. Additional incidental findings, as above.

## 2015-03-08 NOTE — Telephone Encounter (Signed)
Called by Cancer center. Aware of results.

## 2015-03-09 ENCOUNTER — Telehealth: Payer: Self-pay | Admitting: Family Medicine

## 2015-03-09 MED ORDER — CILIDINIUM-CHLORDIAZEPOXIDE 2.5-5 MG PO CAPS: 1.0000 | ORAL_CAPSULE | Freq: Two times a day (BID) | ORAL | Status: DC

## 2015-03-09 MED ORDER — SERTRALINE HCL 50 MG PO TABS: 25.0000 mg | ORAL_TABLET | Freq: Every day | ORAL | Status: DC

## 2015-03-09 NOTE — Telephone Encounter (Signed)
Please call in librax to her pharmacy. Thanks!

## 2015-03-09 NOTE — Telephone Encounter (Signed)
Notified patient of Dr.Johnsons response.   She is requesting a refill on her  Zoloft 50mg  take 0.5mg  daily Librax 5-2.5mg  take 1 capsule BID  She would like a 90 day supply she has an appointment in October  [facility]

## 2015-03-09 NOTE — Telephone Encounter (Signed)
Called into [facility].

## 2015-03-09 NOTE — Telephone Encounter (Signed)
Can you let her know I was just calling her about her CT scan and the cancer center already called her. Thanks!

## 2015-03-25 ENCOUNTER — Ambulatory Visit (INDEPENDENT_AMBULATORY_CARE_PROVIDER_SITE_OTHER): Payer: Medicare Other | Admitting: Family Medicine

## 2015-03-25 ENCOUNTER — Encounter: Payer: Self-pay | Admitting: Family Medicine

## 2015-03-25 VITALS — BP 123/66 | HR 73 | Temp 98.7°F | Ht 59.4 in | Wt 145.4 lb

## 2015-03-25 DIAGNOSIS — J0100 Acute maxillary sinusitis, unspecified: Secondary | ICD-10-CM | POA: Diagnosis not present

## 2015-03-25 DIAGNOSIS — J329 Chronic sinusitis, unspecified: Secondary | ICD-10-CM | POA: Insufficient documentation

## 2015-03-25 MED ORDER — HYDROCOD POLST-CPM POLST ER 10-8 MG/5ML PO SUER: 5.0000 mL | Freq: Two times a day (BID) | ORAL | Status: DC | PRN

## 2015-03-25 MED ORDER — AMOXICILLIN-POT CLAVULANATE 875-125 MG PO TABS: 1.0000 | ORAL_TABLET | Freq: Two times a day (BID) | ORAL | Status: DC

## 2015-03-25 MED ORDER — BENZONATATE 200 MG PO CAPS: 200.0000 mg | ORAL_CAPSULE | Freq: Two times a day (BID) | ORAL | Status: DC | PRN

## 2015-03-25 NOTE — Patient Instructions (Signed)

## 2015-03-25 NOTE — Progress Notes (Signed)
BP 123/66 mmHg  Pulse 73  Temp(Src) 98.7 F (37.1 C)  Ht 4' 11.4" (1.509 m)  Wt 145 lb 6.4 oz (65.953 kg)  BMI 28.96 kg/m2  SpO2 98%   Subjective:    Patient ID: Marie Parker, female    DOB: 11-Jan-1946, 69 y.o.   MRN: [id number]  HPI: Marie Parker is a 69 y.o. female  Chief Complaint  Patient presents with  . URI    x 2 wks, she has been taking Mucinex. It has now went to her chest.   UPPER RESPIRATORY TRACT INFECTION x 2 weeks, seems like it's getting worse Worst symptom: head congestion and cough Fever: yes Cough: yes Shortness of breath: yes Wheezing: yes Chest pain: no Chest tightness: yes Chest congestion: yes Nasal congestion: yes Runny nose: yes Post nasal drip: yes Sneezing: yes Sore throat: yes Swollen glands: yes Sinus pressure: yes Headache: yes Face pain: no Toothache: no Ear pain: no  Ear pressure: no  Eyes red/itching:no Eye drainage/crusting: no  Vomiting: no Rash: no Fatigue: yes Sick contacts: no Strep contacts: no  Context: worse Recurrent sinusitis: no Relief with OTC cold/cough medications: no  Treatments attempted: cold/sinus, mucinex, anti-histamine, pseudoephedrine and cough syrup   Relevant past medical, surgical, family and social history reviewed and updated as indicated. Interim medical history since our last visit reviewed. Allergies and medications reviewed and updated.  Review of Systems  Constitutional: Negative.   HENT: Positive for congestion, postnasal drip, rhinorrhea, sinus pressure and sore throat. Negative for dental problem, drooling, ear discharge, ear pain, facial swelling, hearing loss, mouth sores, nosebleeds, sneezing, tinnitus, trouble swallowing and voice change.   Respiratory: Positive for cough and chest tightness. Negative for apnea, choking, shortness of breath, wheezing and stridor.   Cardiovascular: Negative.   Gastrointestinal: Negative.   Psychiatric/Behavioral: Negative.     Per  HPI unless specifically indicated above     Objective:    BP 123/66 mmHg  Pulse 73  Temp(Src) 98.7 F (37.1 C)  Ht 4' 11.4" (1.509 m)  Wt 145 lb 6.4 oz (65.953 kg)  BMI 28.96 kg/m2  SpO2 98%  Wt Readings from Last 3 Encounters:  03/25/15 145 lb 6.4 oz (65.953 kg)  01/19/15 143 lb (64.864 kg)  03/05/15 142 lb (64.411 kg)    Physical Exam  Constitutional: She is oriented to person, place, and time. She appears well-developed and well-nourished. No distress.  HENT:  Head: Normocephalic and atraumatic.  Right Ear: Hearing, tympanic membrane, external ear and ear canal normal.  Left Ear: Hearing, tympanic membrane, external ear and ear canal normal.  Nose: Mucosal edema and rhinorrhea present. No nose lacerations, sinus tenderness, nasal deformity, septal deviation or nasal septal hematoma. No epistaxis.  No foreign bodies. Right sinus exhibits maxillary sinus tenderness. Right sinus exhibits no frontal sinus tenderness. Left sinus exhibits no maxillary sinus tenderness and no frontal sinus tenderness.  Mouth/Throat: Uvula is midline, oropharynx is clear and moist and mucous membranes are normal. No oropharyngeal exudate.  Eyes: Conjunctivae and lids are normal. Right eye exhibits no discharge. Left eye exhibits no discharge. No scleral icterus.  Neck: Normal range of motion. Neck supple. No JVD present. No tracheal deviation present. No thyromegaly present.  Cardiovascular: Normal rate, regular rhythm and normal heart sounds.  Exam reveals no gallop and no friction rub.   No murmur heard. Pulmonary/Chest: Effort normal. No stridor. No respiratory distress. She has wheezes. She has no rales. She exhibits no tenderness.  Musculoskeletal: Normal range of  motion.  Lymphadenopathy:    She has cervical adenopathy.  Neurological: She is alert and oriented to person, place, and time.  Skin: Skin is warm, dry and intact. No rash noted. No erythema. No pallor.  Psychiatric: She has a normal  mood and affect. Her speech is normal and behavior is normal. Judgment and thought content normal. Cognition and memory are normal.  Nursing note and vitals reviewed.     Assessment & Plan:   Problem List Items Addressed This Visit      Respiratory   Sinusitis - Primary    Appears to have R maxillary sinusitis for 2 weeks with fevers. Will treat with augmentin and medicine to stop her cough. Continue mucinex and increased fluids and rest. Call or come in if not getting better or getting worse.       Relevant Medications   amoxicillin-clavulanate (AUGMENTIN) 875-125 MG per tablet   chlorpheniramine-HYDROcodone (TUSSIONEX PENNKINETIC ER) 10-8 MG/5ML SUER   benzonatate (TESSALON) 200 MG capsule       Follow up plan: Return if symptoms worsen or fail to improve.

## 2015-03-25 NOTE — Assessment & Plan Note (Signed)
Appears to have R maxillary sinusitis for 2 weeks with fevers. Will treat with augmentin and medicine to stop her cough. Continue mucinex and increased fluids and rest. Call or come in if not getting better or getting worse.

## 2015-04-14 ENCOUNTER — Other Ambulatory Visit: Payer: Self-pay | Admitting: Family Medicine

## 2015-04-14 NOTE — Telephone Encounter (Signed)
yours

## 2015-04-29 ENCOUNTER — Other Ambulatory Visit: Payer: Self-pay | Admitting: Family Medicine

## 2015-04-29 MED ORDER — HYDRALAZINE HCL 25 MG PO TABS: 25.0000 mg | ORAL_TABLET | Freq: Two times a day (BID) | ORAL | Status: DC

## 2015-04-30 ENCOUNTER — Other Ambulatory Visit: Payer: Self-pay | Admitting: Family Medicine

## 2015-05-11 ENCOUNTER — Ambulatory Visit (INDEPENDENT_AMBULATORY_CARE_PROVIDER_SITE_OTHER): Payer: Medicare Other | Admitting: Family Medicine

## 2015-05-11 ENCOUNTER — Encounter: Payer: Self-pay | Admitting: Family Medicine

## 2015-05-11 VITALS — BP 123/74 | HR 61 | Temp 97.9°F | Wt 146.0 lb

## 2015-05-11 DIAGNOSIS — L298 Other pruritus: Secondary | ICD-10-CM

## 2015-05-11 DIAGNOSIS — L900 Lichen sclerosus et atrophicus: Secondary | ICD-10-CM | POA: Diagnosis not present

## 2015-05-11 DIAGNOSIS — N898 Other specified noninflammatory disorders of vagina: Secondary | ICD-10-CM

## 2015-05-11 LAB — WET PREP FOR TRICH, YEAST, CLUE
Clue Cell Exam: NEGATIVE
Trichomonas Exam: NEGATIVE
Yeast Exam: NEGATIVE

## 2015-05-11 MED ORDER — CLOBETASOL PROPIONATE 0.05 % EX OINT: TOPICAL_OINTMENT | CUTANEOUS | Status: DC

## 2015-05-11 NOTE — Progress Notes (Signed)
BP 123/74 mmHg  Pulse 61  Temp(Src) 97.9 F (36.6 C)  Wt 146 lb (66.225 kg)  SpO2 99%   Subjective:    Patient ID: Marie Parker, female    DOB: 1946-01-12, 69 y.o.   MRN: [id number]  HPI: Marie Parker is a 69 y.o. female  Chief Complaint  Patient presents with  . Vaginal Itching   VAGINAL Itching Duration: months Discharge description: No discharge, just itching  Pruritus: yes Dysuria: yes Malodorous: no Urinary frequency: no Fevers: no Abdominal pain: no  Sexual activity: monogamous History of sexually transmitted diseases: no Recent antibiotic use: yes Context: worse  Treatments attempted: antifungal and vagisil   Relevant past medical, surgical, family and social history reviewed and updated as indicated. Interim medical history since our last visit reviewed. Allergies and medications reviewed and updated.  Review of Systems  Constitutional: Negative.   Respiratory: Negative.   Gastrointestinal: Negative.   Genitourinary: Negative.   Psychiatric/Behavioral: Negative.     Per HPI unless specifically indicated above     Objective:    BP 123/74 mmHg  Pulse 61  Temp(Src) 97.9 F (36.6 C)  Wt 146 lb (66.225 kg)  SpO2 99%  Wt Readings from Last 3 Encounters:  05/11/15 146 lb (66.225 kg)  03/25/15 145 lb 6.4 oz (65.953 kg)  01/19/15 143 lb (64.864 kg)    Physical Exam  Constitutional: She is oriented to person, place, and time. She appears well-developed and well-nourished. No distress.  HENT:  Head: Normocephalic and atraumatic.  Right Ear: Hearing normal.  Left Ear: Hearing normal.  Nose: Nose normal.  Eyes: Conjunctivae and lids are normal. Right eye exhibits no discharge. Left eye exhibits no discharge. No scleral icterus.  Pulmonary/Chest: Effort normal. No respiratory distress.  Genitourinary:  Slivery patches and erythema on mons pubis and labia majora  Musculoskeletal: Normal range of motion.  Neurological: She is alert and  oriented to person, place, and time.  Skin: Skin is intact. No rash noted.  Psychiatric: She has a normal mood and affect. Her speech is normal and behavior is normal. Judgment and thought content normal. Cognition and memory are normal.  Nursing note and vitals reviewed.       Assessment & Plan:   Problem List Items Addressed This Visit      Musculoskeletal and Integument   Lichen sclerosus - Primary    Appears to be the problem. Will treat with clobetasol and recheck in 2-3 months.        Other Visit Diagnoses    Vaginal itching        Wet prep negative. Likely due to lichen sclerosis.     Relevant Orders    WET PREP FOR [place], YEAST, CLUE        Follow up plan: Return in about 3 months (around 08/10/2015) for follow up on lichen scelrosis.

## 2015-05-11 NOTE — Assessment & Plan Note (Signed)
Appears to be the problem. Will treat with clobetasol and recheck in 2-3 months.

## 2015-05-11 NOTE — Patient Instructions (Signed)
Lichen Sclerosus Lichen sclerosus is a skin problem. It can happen on any part of the body, but it commonly involves the anal or genital areas. Lichen sclerosus is not an infection or a fungus. Girls and women are more commonly affected than boys and men. CAUSES The cause is not known. It could be the result of an overactive immune system or a lack of certain hormones. Lichen sclerosus is not passed from one person to another (not contagious). SYMPTOMS Your skin may have:  Thin, wrinkled, white areas.  Thickened white areas.  Red and swollen patches.  Tears or cracks.  Bruising.  Blood blisters.  Severe itching. You may also have pain, itching, or burning with urination. Constipation is also common in people with lichen sclerosus. DIAGNOSIS Your caregiver will do a physical exam. Sometimes, a tissue sample (biopsy) may be sent for testing. TREATMENT Treatment may involve putting a thin layer of medicated cream (topical steroid) over the areas with lichen sclerosus. Use the cream only as directed by your caregiver.  HOME CARE INSTRUCTIONS  Only take over-the-counter or prescription medicines as directed by your caregiver.  Keep the vaginal area as clean and dry as possible. SEEK MEDICAL CARE IF: You develop increasing pain, swelling, or redness. Document Released: 01/04/2011 Document Revised: 11/06/2011 Document Reviewed: 01/04/2011 Exit[employer] Patient Information 2015 [employer]. This information is not intended to replace advice given to you by your health care provider. Make sure you discuss any questions you have with your health care provider.  

## 2015-05-24 DIAGNOSIS — L821 Other seborrheic keratosis: Secondary | ICD-10-CM | POA: Diagnosis not present

## 2015-05-31 ENCOUNTER — Other Ambulatory Visit: Payer: Self-pay | Admitting: Family Medicine

## 2015-06-01 ENCOUNTER — Telehealth: Payer: Self-pay

## 2015-06-01 MED ORDER — ATENOLOL 50 MG PO TABS: 50.0000 mg | ORAL_TABLET | Freq: Every day | ORAL | Status: DC

## 2015-06-01 MED ORDER — ATORVASTATIN CALCIUM 40 MG PO TABS: 40.0000 mg | ORAL_TABLET | Freq: Every day | ORAL | Status: DC

## 2015-06-01 MED ORDER — BENAZEPRIL HCL 40 MG PO TABS: 40.0000 mg | ORAL_TABLET | Freq: Every day | ORAL | Status: DC

## 2015-06-01 NOTE — Telephone Encounter (Signed)
[facility]  Requesting 90 day refills for  Benazepril 40mg  Tab Atenolol 50mg  Tab Atorvastatin 40mg  Tab  Patient has appointment on 06/22/15

## 2015-06-22 ENCOUNTER — Ambulatory Visit (INDEPENDENT_AMBULATORY_CARE_PROVIDER_SITE_OTHER): Payer: Medicare Other | Admitting: Family Medicine

## 2015-06-22 ENCOUNTER — Ambulatory Visit: Payer: Self-pay | Admitting: Family Medicine

## 2015-06-22 ENCOUNTER — Encounter: Payer: Self-pay | Admitting: Family Medicine

## 2015-06-22 VITALS — BP 127/83 | HR 60 | Temp 98.3°F | Ht 59.2 in | Wt 146.0 lb

## 2015-06-22 DIAGNOSIS — E785 Hyperlipidemia, unspecified: Secondary | ICD-10-CM | POA: Diagnosis not present

## 2015-06-22 DIAGNOSIS — F4321 Adjustment disorder with depressed mood: Secondary | ICD-10-CM | POA: Diagnosis not present

## 2015-06-22 DIAGNOSIS — M1990 Unspecified osteoarthritis, unspecified site: Secondary | ICD-10-CM

## 2015-06-22 DIAGNOSIS — Z23 Encounter for immunization: Secondary | ICD-10-CM

## 2015-06-22 DIAGNOSIS — M109 Gout, unspecified: Secondary | ICD-10-CM | POA: Insufficient documentation

## 2015-06-22 DIAGNOSIS — I129 Hypertensive chronic kidney disease with stage 1 through stage 4 chronic kidney disease, or unspecified chronic kidney disease: Secondary | ICD-10-CM | POA: Diagnosis not present

## 2015-06-22 DIAGNOSIS — I10 Essential (primary) hypertension: Secondary | ICD-10-CM | POA: Diagnosis not present

## 2015-06-22 LAB — LIPID PANEL PICCOLO, WAIVED
CHOL/HDL RATIO PICCOLO,WAIVE: 2.4 mg/dL
CHOLESTEROL PICCOLO, WAIVED: 133 mg/dL (ref <200)
HDL CHOL PICCOLO, WAIVED: 55 mg/dL — AB (ref >59)
LDL Chol Calc Piccolo Waived: 53 mg/dL (ref <100)
TRIGLYCERIDES PICCOLO,WAIVED: 121 mg/dL (ref <150)
VLDL Chol Calc Piccolo,Waive: 24 mg/dL (ref <30)

## 2015-06-22 LAB — MICROALBUMIN, URINE WAIVED
Creatinine, Urine Waived: 200 mg/dL (ref 10–300)
MICROALB, UR WAIVED: 30 mg/L — AB (ref 0–19)
Microalb/Creat Ratio: <30 mg/g (ref <30)

## 2015-06-22 MED ORDER — AMLODIPINE BESYLATE 10 MG PO TABS: ORAL_TABLET | ORAL | Status: DC

## 2015-06-22 MED ORDER — DICLOFENAC SODIUM 1 % TD GEL: 4.0000 g | Freq: Four times a day (QID) | TRANSDERMAL | Status: DC

## 2015-06-22 MED ORDER — HYDRALAZINE HCL 25 MG PO TABS: 25.0000 mg | ORAL_TABLET | Freq: Two times a day (BID) | ORAL | Status: DC

## 2015-06-22 MED ORDER — ATORVASTATIN CALCIUM 40 MG PO TABS: 40.0000 mg | ORAL_TABLET | Freq: Every day | ORAL | Status: DC

## 2015-06-22 MED ORDER — ATENOLOL 50 MG PO TABS: 50.0000 mg | ORAL_TABLET | Freq: Every day | ORAL | Status: DC

## 2015-06-22 MED ORDER — SERTRALINE HCL 50 MG PO TABS: 25.0000 mg | ORAL_TABLET | Freq: Every day | ORAL | Status: DC

## 2015-06-22 MED ORDER — CILIDINIUM-CHLORDIAZEPOXIDE 2.5-5 MG PO CAPS: 1.0000 | ORAL_CAPSULE | Freq: Two times a day (BID) | ORAL | Status: DC

## 2015-06-22 MED ORDER — ASPIRIN 81 MG PO TABS: 81.0000 mg | ORAL_TABLET | Freq: Every day | ORAL | Status: DC

## 2015-06-22 MED ORDER — PANTOPRAZOLE SODIUM 20 MG PO TBEC: 20.0000 mg | DELAYED_RELEASE_TABLET | Freq: Every day | ORAL | Status: DC

## 2015-06-22 MED ORDER — BENAZEPRIL HCL 40 MG PO TABS: 40.0000 mg | ORAL_TABLET | Freq: Every day | ORAL | Status: DC

## 2015-06-22 NOTE — Assessment & Plan Note (Signed)
Under good control. Continue current regimen. Continue to monitor. BMP and microalbumin checked today.

## 2015-06-22 NOTE — Assessment & Plan Note (Signed)
Concerned about pain in foot, likely arthritis. Will check uric acid level today.

## 2015-06-22 NOTE — Progress Notes (Signed)
BP 127/83 mmHg  Pulse 60  Temp(Src) 98.3 F (36.8 C)  Ht 4' 11.2" (1.504 m)  Wt 146 lb (66.225 kg)  BMI 29.28 kg/m2  SpO2 98%   Subjective:    Patient ID: Marie Parker, female    DOB: 1946/05/27, 69 y.o.   MRN: [id number]  HPI: Marie Parker is a 68 y.o. female  Chief Complaint  Patient presents with  . Hypertension  . Hyperlipidemia  . Anxiety   HYPERTENSION / HYPERLIPIDEMIA Satisfied with current treatment? yes Duration of hypertension: chronic BP monitoring frequency: not checking BP medication side effects: no Duration of hyperlipidemia: chronic Cholesterol medication side effects: no Cholesterol supplements: none Medication compliance: excellent compliance Aspirin: yes Recent stressors: yes Recurrent headaches: no Visual changes: no Palpitations: no Dyspnea: no Chest pain: no Lower extremity edema: no Dizzy/lightheaded: no  ANXIETY/STRESS- her sister died about a month ago, has been having a lot of trouble with it. Still grieving a lot.  Duration:chronic, but exacerbated Anxious mood: yes  Excessive worrying: yes Irritability: no  Sweating: no Nausea: no Palpitations:no Hyperventilation: no Panic attacks: no Agoraphobia: no  Obscessions/compulsions: no Depressed mood: yes Anhedonia: no Weight changes: no Insomnia: yes hard to stay asleep  Hypersomnia: no Fatigue/loss of energy: yes Feelings of worthlessness: no Feelings of guilt: yes Impaired concentration/indecisiveness: no Suicidal ideations: no  Crying spells: no Recent Stressors/Life Changes: yes   Relationship problems: no   Family stress: no     Financial stress: no    Job stress: no    Recent death/loss: yes  Lichen sclerosis is better. Not using the cream any more  Relevant past medical, surgical, family and social history reviewed and updated as indicated. Interim medical history since our last visit reviewed. Allergies and medications reviewed and  updated.  Review of Systems  Constitutional: Negative.   Respiratory: Negative.   Cardiovascular: Negative.   Genitourinary: Negative.   Musculoskeletal: Negative.   Psychiatric/Behavioral: Negative.     Per HPI unless specifically indicated above     Objective:    BP 127/83 mmHg  Pulse 60  Temp(Src) 98.3 F (36.8 C)  Ht 4' 11.2" (1.504 m)  Wt 146 lb (66.225 kg)  BMI 29.28 kg/m2  SpO2 98%  Wt Readings from Last 3 Encounters:  06/22/15 146 lb (66.225 kg)  05/11/15 146 lb (66.225 kg)  03/25/15 145 lb 6.4 oz (65.953 kg)    Physical Exam  Constitutional: She is oriented to person, place, and time. She appears well-developed and well-nourished. No distress.  HENT:  Head: Normocephalic and atraumatic.  Right Ear: Hearing normal.  Left Ear: Hearing normal.  Nose: Nose normal.  Eyes: Conjunctivae and lids are normal. Right eye exhibits no discharge. Left eye exhibits no discharge. No scleral icterus.  Cardiovascular: Normal rate, regular rhythm, normal heart sounds and intact distal pulses.  Exam reveals no gallop and no friction rub.   No murmur heard. Pulmonary/Chest: Effort normal and breath sounds normal. No respiratory distress. She has no wheezes. She has no rales. She exhibits no tenderness.  Musculoskeletal: Normal range of motion.  Neurological: She is alert and oriented to person, place, and time.  Skin: Skin is warm, dry and intact. No rash noted. No erythema. No pallor.  Psychiatric: She has a normal mood and affect. Her speech is normal and behavior is normal. Judgment and thought content normal. Cognition and memory are normal.  Nursing note and vitals reviewed.   Results for orders placed or performed in visit on 05/11/15  WET PREP FOR TRICH, YEAST, CLUE  Result Value Ref Range   Trichomonas Exam Negative Negative   Yeast Exam Negative Negative   Clue Cell Exam Negative Negative      Assessment & Plan:   Problem List Items Addressed This Visit       Cardiovascular and Mediastinum   HTN (hypertension)    Under good control. Continue current regimen. Continue to monitor. BMP and microalbumin checked today.      Relevant Medications   hydrALAZINE (APRESOLINE) 25 MG tablet   benazepril (LOTENSIN) 40 MG tablet   atorvastatin (LIPITOR) 40 MG tablet   atenolol (TENORMIN) 50 MG tablet   aspirin 81 MG tablet   amLODipine (NORVASC) 10 MG tablet     Other   Hyperlipidemia    Well controlled on current regimen. Continue current regimen. Continue to monitor.       Relevant Medications   hydrALAZINE (APRESOLINE) 25 MG tablet   benazepril (LOTENSIN) 40 MG tablet   atorvastatin (LIPITOR) 40 MG tablet   atenolol (TENORMIN) 50 MG tablet   aspirin 81 MG tablet   amLODipine (NORVASC) 10 MG tablet   Other Relevant Orders   Lipid Panel Piccolo, Waived   Comprehensive metabolic panel   Gout    Concerned about pain in foot, likely arthritis. Will check uric acid level today.      Relevant Orders   Uric acid    Other Visit Diagnoses    Grief    -  Primary    Over her sister's death. Normal grieving, but she is afraid it's not going to get better. If not better after the new year. Will call and we will get her in.     Immunization due        Flu shot given today.     Relevant Orders    Flu Vaccine QUAD 36+ mos PF IM (Fluarix & Fluzone Quad PF) (Completed)    Arthritis        Can't take NSAIDs due to kidney function and GERD. Has quite a bit of arthritis pain. Will start her on voltaren. Call if not helping.     Relevant Medications    aspirin 81 MG tablet        Follow up plan: Return in about 6 months (around 12/21/2015) for Physical.

## 2015-06-22 NOTE — Assessment & Plan Note (Signed)
Well controlled on current regimen. Continue current regimen. Continue to monitor.  

## 2015-06-22 NOTE — Patient Instructions (Signed)
Complicated Grieving Grief is a normal response to the death of someone close to you. Feelings of fear, anger, and guilt can affect almost everyone who loses a loved one. It is also common to have symptoms of depression while you are grieving. These include problems with sleep, loss of appetite, and lack of energy. They may last for weeks or months after a loss. Complicated grief is different from normal grief or depression. Normal grieving involves sadness and feelings of loss, but these feelings are not constant. Complicated grief is a constant and severe type of grief. It interferes with your ability to function normally. It may last for several months to a year or longer. Complicated grief may require treatment from a mental health care provider. CAUSES  It is not known why some people continue to struggle with grief and others do not. You may be at higher risk for complicated grief if:  The death of your loved one was sudden or unexpected.  The death of your loved one was due to a violent event.  Your loved one committed suicide.  Your loved one was a child or a young person.  You were very close to or dependent on the loved one.  You have a history of depression. SIGNS AND SYMPTOMS Signs and symptoms of complicated grief may include:  Feeling disbelief or numbness.  Being unable to enjoy good memories of your loved one.  Needing to avoid anything that reminds you of your loved one.  Being unable to stop thinking about the death.  Feeling intense anger or guilt.  Feeling alone and hopeless.  Feeling that your life is meaningless and empty.  Losing the desire to live. DIAGNOSIS Your health care provider may diagnose complicated grief if:  You have constant symptoms of grief for 6-12 months or longer.  Your symptoms are interfering with your ability to live your life. Your health care provider may want you to see a mental health care provider. Many symptoms of depression  are similar to the symptoms of complicated grief. It is important to be evaluated for complicated grief along with other mental health conditions. TREATMENT  Talk therapy with a mental health provider is the most common treatment for complicated grief. During therapy, you will learn healthy ways to cope with the loss of your loved one. In some cases, your mental health care provider may also recommend antidepressant medicines. HOME CARE INSTRUCTIONS  Take care of yourself.  Eat regular meals and maintain a healthy diet. Eat plenty of fruits, vegetables, and whole grains.  Try to get some exercise each day.  Keep regular hours for sleep. Try to get at least 8 hours of sleep each night.  Do not use drugs or alcohol to ease your symptoms.  Take medicines only as directed by your health care provider.  Spend time with friends and loved ones.  Consider joining a grief (bereavement) support group to help you deal with your loss.  Keep all follow-up visits as directed by your health care provider. This is important. SEEK MEDICAL CARE IF:  Your symptoms keep you from functioning normally.  Your symptoms do not get better with treatment. SEEK IMMEDIATE MEDICAL CARE IF:  You have serious thoughts of hurting yourself or someone else.  You have suicidal feelings.   This information is not intended to replace advice given to you by your health care provider. Make sure you discuss any questions you have with your health care provider.   Document Released: 08/14/2005   Document Revised: 05/05/2015 Document Reviewed: 01/22/2014 Elsevier Interactive Patient Education 2016 [employer] Inc.  

## 2015-06-23 ENCOUNTER — Encounter: Payer: Self-pay | Admitting: Family Medicine

## 2015-06-23 LAB — COMPREHENSIVE METABOLIC PANEL
A/G RATIO: 2.2 (ref 1.1–2.5)
ALBUMIN: 4.3 g/dL (ref 3.6–4.8)
ALK PHOS: 69 IU/L (ref 39–117)
ALT: 14 IU/L (ref 0–32)
AST: 18 IU/L (ref 0–40)
BUN / CREAT RATIO: 15 (ref 11–26)
BUN: 15 mg/dL (ref 8–27)
Bilirubin Total: 0.6 mg/dL (ref 0.0–1.2)
CO2: 24 mmol/L (ref 18–29)
CREATININE: 0.98 mg/dL (ref 0.57–1.00)
Calcium: 9.3 mg/dL (ref 8.7–10.3)
Chloride: 103 mmol/L (ref 97–106)
GFR calc Af Amer: 69 mL/min/{1.73_m2} (ref >59)
GFR calc non Af Amer: 60 mL/min/{1.73_m2} (ref >59)
GLOBULIN, TOTAL: 2 g/dL (ref 1.5–4.5)
Glucose: 106 mg/dL — ABNORMAL HIGH (ref 65–99)
Potassium: 3.8 mmol/L (ref 3.5–5.2)
SODIUM: 144 mmol/L (ref 136–144)
Total Protein: 6.3 g/dL (ref 6.0–8.5)

## 2015-06-23 LAB — URIC ACID: Uric Acid: 8.4 mg/dL — ABNORMAL HIGH (ref 2.5–7.1)

## 2015-06-25 ENCOUNTER — Telehealth: Payer: Self-pay

## 2015-06-25 NOTE — Telephone Encounter (Signed)
Patient states that she was in the office earlier this week and would like to know the result of her labs. Patient also wanted to let Dr. Wynetta Emery know that the RX cream prescribed for arthritis is ( $59.00)  too expensive and wondered if there is a cheaper cream that Dr. Wynetta Emery can call in?

## 2015-06-25 NOTE — Telephone Encounter (Signed)
Patient notified

## 2015-06-25 NOTE — Telephone Encounter (Signed)
I sent her a letter with the results. Unfortunately there is not anything cheaper. She can try OTC medicines like capsacin and aspercream.

## 2015-07-01 ENCOUNTER — Telehealth: Payer: Self-pay

## 2015-07-01 MED ORDER — PANTOPRAZOLE SODIUM 40 MG PO TBEC: 40.0000 mg | DELAYED_RELEASE_TABLET | Freq: Every day | ORAL | Status: DC

## 2015-07-01 NOTE — Telephone Encounter (Signed)
Patient called, she was sent Protonix 20mg  instead of Protonix 40mg . Can you please give her a prescription for the 40mg 

## 2015-07-01 NOTE — Telephone Encounter (Signed)
rx sent to her pharmacy 

## 2015-07-26 ENCOUNTER — Other Ambulatory Visit: Payer: Self-pay | Admitting: Family Medicine

## 2015-07-26 ENCOUNTER — Telehealth: Payer: Self-pay | Admitting: Family Medicine

## 2015-07-26 NOTE — Telephone Encounter (Signed)
OK for her to come pick up her Rx for her klonopin

## 2015-08-02 ENCOUNTER — Telehealth: Payer: Self-pay | Admitting: Family Medicine

## 2015-08-02 ENCOUNTER — Encounter: Payer: Self-pay | Admitting: Family Medicine

## 2015-08-02 ENCOUNTER — Ambulatory Visit (INDEPENDENT_AMBULATORY_CARE_PROVIDER_SITE_OTHER): Payer: Medicare Other | Admitting: Family Medicine

## 2015-08-02 ENCOUNTER — Ambulatory Visit
Admission: RE | Admit: 2015-08-02 | Discharge: 2015-08-02 | Disposition: A | Discharge status: Home / Self Care | Payer: Medicare Other | Source: Ambulatory Visit | Attending: Family Medicine | Admitting: Family Medicine

## 2015-08-02 VITALS — BP 138/80 | HR 63 | Temp 97.6°F | Ht 59.2 in | Wt 148.0 lb

## 2015-08-02 DIAGNOSIS — M7989 Other specified soft tissue disorders: Secondary | ICD-10-CM | POA: Diagnosis not present

## 2015-08-02 DIAGNOSIS — M79672 Pain in left foot: Secondary | ICD-10-CM | POA: Insufficient documentation

## 2015-08-02 MED ORDER — TRAMADOL HCL 50 MG PO TABS: 50.0000 mg | ORAL_TABLET | Freq: Three times a day (TID) | ORAL | Status: DC | PRN

## 2015-08-02 MED ORDER — PREDNISONE 10 MG PO TABS: ORAL_TABLET | ORAL | Status: DC

## 2015-08-02 NOTE — Progress Notes (Signed)
BP 138/80 mmHg  Pulse 63  Temp(Src) 97.6 F (36.4 C)  Ht 4' 11.2" (1.504 m)  Wt 148 lb (67.132 kg)  BMI 29.68 kg/m2  SpO2 98%   Subjective:    Patient ID: Marie Parker, female    DOB: 03/13/1946, 69 y.o.   MRN: [id number]  HPI: Marie Parker is a 69 y.o. female  Chief Complaint  Patient presents with  . Foot Pain   FOOT PAIN Duration: Since Nov 8th Involved foot: left Mechanism of injury: Stood on her feet for 13 hours  Location: MTP joint 1st Onset: sudden  Severity: severe  Quality:  sore Frequency: constant Radiation: no Aggravating factors: bending  Alleviating factors: ice  Status: stable Treatments attempted: boot, colcrys. lanocaine, ice and HEP  Relief with NSAIDs?:  No NSAIDs Taken Weakness with weight bearing or walking: no Morning stiffness: no Swelling: yes Redness: no Bruising: yes Paresthesias / decreased sensation: no  Fevers:no  Relevant past medical, surgical, family and social history reviewed and updated as indicated. Interim medical history since our last visit reviewed. Allergies and medications reviewed and updated.  Review of Systems  Constitutional: Negative.   Respiratory: Negative.   Cardiovascular: Negative.   Musculoskeletal: Negative.   Psychiatric/Behavioral: Negative.     Per HPI unless specifically indicated above     Objective:    BP 138/80 mmHg  Pulse 63  Temp(Src) 97.6 F (36.4 C)  Ht 4' 11.2" (1.504 m)  Wt 148 lb (67.132 kg)  BMI 29.68 kg/m2  SpO2 98%  Wt Readings from Last 3 Encounters:  08/02/15 148 lb (67.132 kg)  06/22/15 146 lb (66.225 kg)  05/11/15 146 lb (66.225 kg)    Physical Exam  Constitutional: She is oriented to person, place, and time. She appears well-developed and well-nourished. No distress.  HENT:  Head: Normocephalic and atraumatic.  Right Ear: Hearing normal.  Left Ear: Hearing normal.  Nose: Nose normal.  Eyes: Conjunctivae and lids are normal. Right eye exhibits no  discharge. Left eye exhibits no discharge. No scleral icterus.  Pulmonary/Chest: Effort normal. No respiratory distress.  Musculoskeletal: Normal range of motion. She exhibits edema and tenderness.  swelling over 1st MTP on the L, no redness, no heat, some bruising on the plantar side, tenderness to palpation   Neurological: She is alert and oriented to person, place, and time.  Skin: Skin is warm, dry and intact. No rash noted. No erythema. No pallor.  Psychiatric: She has a normal mood and affect. Her speech is normal and behavior is normal. Judgment and thought content normal. Cognition and memory are normal.  Nursing note and vitals reviewed.   Results for orders placed or performed in visit on 06/22/15  Lipid Panel Piccolo, [employer]  Result Value Ref Range   Cholesterol Piccolo, Waived 133 <200 mg/dL   HDL Chol Piccolo, Waived 55 (L) >59 mg/dL   Triglycerides Piccolo,Waived 121 <150 mg/dL   Chol/HDL Ratio Piccolo,Waive 2.4 mg/dL   LDL Chol Calc Piccolo Waived 53 <100 mg/dL   VLDL Chol Calc Piccolo,Waive 24 <30 mg/dL  Comprehensive metabolic panel  Result Value Ref Range   Glucose 106 (H) 65 - 99 mg/dL   BUN 15 8 - 27 mg/dL   Creatinine, Ser 0.98 0.57 - 1.00 mg/dL   GFR calc non Af Amer 60 >59 mL/min/1.73   GFR calc Af Amer 69 >59 mL/min/1.73   BUN/Creatinine Ratio 15 11 - 26   Sodium 144 136 - 144 mmol/L   Potassium 3.8 3.5 -  5.2 mmol/L   Chloride 103 97 - 106 mmol/L   CO2 24 18 - 29 mmol/L   Calcium 9.3 8.7 - 10.3 mg/dL   Total Protein 6.3 6.0 - 8.5 g/dL   Albumin 4.3 3.6 - 4.8 g/dL   Globulin, Total 2.0 1.5 - 4.5 g/dL   Albumin/Globulin Ratio 2.2 1.1 - 2.5   Bilirubin Total 0.6 0.0 - 1.2 mg/dL   Alkaline Phosphatase 69 39 - 117 IU/L   AST 18 0 - 40 IU/L   ALT 14 0 - 32 IU/L  Microalbumin, Urine Waived  Result Value Ref Range   Microalb, Ur Waived 30 (H) 0 - 19 mg/L   Creatinine, Urine Waived 200 10 - 300 mg/dL   Microalb/Creat Ratio <30 <30 mg/g  Uric acid  Result  Value Ref Range   Uric Acid 8.4 (H) 2.5 - 7.1 mg/dL      Assessment & Plan:   Problem List Items Addressed This Visit    None    Visit Diagnoses    Left foot pain    -  Primary    Does not seem to be gout. Possibly stress fracture. Will obtain x-ray. Prednisone for swelling. Tramadol for pain. Await results. Continue to monitor.     Relevant Orders    DG Foot Complete Left        Follow up plan: Return if symptoms worsen or fail to improve.

## 2015-08-02 NOTE — Telephone Encounter (Signed)
Called with the results of her x-ray. Already feeling better with the prednisone. Gave results. Possibly degenerative or gout. Will see how she is doing.

## 2015-08-30 ENCOUNTER — Ambulatory Visit
Admission: EM | Admit: 2015-08-30 | Discharge: 2015-08-30 | Disposition: A | Discharge status: Home / Self Care | Payer: Medicare Other | Attending: Family Medicine | Admitting: Family Medicine

## 2015-08-30 ENCOUNTER — Encounter: Payer: Self-pay | Admitting: Emergency Medicine

## 2015-08-30 DIAGNOSIS — J069 Acute upper respiratory infection, unspecified: Secondary | ICD-10-CM | POA: Diagnosis not present

## 2015-08-30 MED ORDER — CETIRIZINE-PSEUDOEPHEDRINE ER 5-120 MG PO TB12: 1.0000 | ORAL_TABLET | Freq: Two times a day (BID) | ORAL | Status: DC

## 2015-08-30 MED ORDER — HYDROCOD POLST-CPM POLST ER 10-8 MG/5ML PO SUER: 5.0000 mL | Freq: Two times a day (BID) | ORAL | Status: DC

## 2015-08-30 NOTE — ED Provider Notes (Signed)
CSN: [id number]     Arrival date & time 08/30/15  0920 History   First MD Initiated Contact with Patient 08/30/15 1113     Chief Complaint  Patient presents with  . Cough   (Consider location/radiation/quality/duration/timing/severity/associated sxs/prior Treatment) HPI  70 year old female who presents with a cough and chest congestion for about a week. She had a fever 101 maximal but is afebrile today at 98. She's had some hoarseness and continues to have some hoarseness today. Getting any sleep because the cough and has a primary reason why she is here today. O2 sats on room air 98%.  Past Medical History  Diagnosis Date  . Menopause   . GERD (gastroesophageal reflux disease)   . Generalized osteoarthritis   . Arthritis   . Osteoporosis   . CAD (coronary artery disease)   . Gout   . Hypertension   . Hyperlipidemia   . Parathyroid adenoma   . PVD (peripheral vascular disease) ([place])   . Tobacco abuse   . Chronic kidney disease   . Depression   . Rotator cuff syndrome of shoulder and allied disorders   . Anxiety   . Insomnia   . IFG (impaired fasting glucose)   . PVD (peripheral vascular disease) ([place])   . History of endoscopy 11/2012    Gastritis   Past Surgical History  Procedure Laterality Date  . Iliac artery stent      right iliac artery stent   . Parathyroidectomy    . Cardiac catheterization  09/17/2007    Insignificant CAD with 40-50% stenosis of mid LAD.   [name] Breast fibroadenoma surgery    . Skin biopsy      precancerous   Family History  Problem Relation Age of Onset  . Heart attack Father 30  . Alcohol abuse Father   . Cancer Father   . Heart disease Father   . Liver disease Father   . Hypertension Father   . Heart disease Sister 38    CAD; s/p stent placement  . Stroke Sister   . AAA (abdominal aortic aneurysm) Sister   . Dementia Sister   . Stroke Mother   . Hypertension Mother   . Diabetes Mother   . Stroke Maternal Grandmother   . Heart  attack Maternal Grandfather   . Stroke Paternal Grandmother    Social History  Substance Use Topics  . Smoking status: Former Smoker -- 1.00 packs/day for 42 years    Types: Cigarettes    Quit date: 10/09/2001  . Smokeless tobacco: Never Used  . Alcohol Use: No   OB History    No data available     Review of Systems  Constitutional: Positive for fever, chills and fatigue.  HENT: Positive for congestion, ear pain, postnasal drip, rhinorrhea, sinus pressure, sneezing, sore throat and voice change.   Respiratory: Positive for cough. Negative for shortness of breath, wheezing and stridor.   All other systems reviewed and are negative.   Allergies  Crestor; Cyclobenzaprine; Erythromycin; Fosamax; Neosporin; and Tramadol hcl  Home Medications   Prior to Admission medications   Medication Sig Start Date End Date Taking? Authorizing Provider  amLODipine (NORVASC) 10 MG tablet Take 1 tablet by mouth  daily 06/22/15   Megan P Johnson, DO  aspirin 81 MG tablet Take 1 tablet (81 mg total) by mouth daily. 06/22/15   Megan P Johnson, DO  atenolol (TENORMIN) 50 MG tablet Take 1 tablet (50 mg total) by mouth daily. 06/22/15   Megan  P Johnson, DO  atorvastatin (LIPITOR) 40 MG tablet Take 1 tablet (40 mg total) by mouth daily. 06/22/15   Megan P Johnson, DO  benazepril (LOTENSIN) 40 MG tablet Take 1 tablet (40 mg total) by mouth daily. 06/22/15   Megan P Johnson, DO  cetirizine-pseudoephedrine (ZYRTEC-D) 5-120 MG tablet Take 1 tablet by mouth 2 (two) times daily. 08/30/15   Lorin Picket, PA-C  chlorpheniramine-HYDROcodone (TUSSIONEX PENNKINETIC ER) 10-8 MG/5ML SUER Take 5 mLs by mouth 2 (two) times daily. 08/30/15   Lorin Picket, PA-C  clidinium-chlordiazePOXIDE (LIBRAX) 5-2.5 MG capsule Take 1 capsule by mouth 2 (two) times daily before lunch and supper. 06/22/15   Megan P Johnson, DO  clobetasol ointment (TEMOVATE) 0.05 % Daily at night for 6-12 weeks, if improvement, decrease to 2-3x per  week 05/11/15   Megan P Johnson, DO  clonazePAM (KLONOPIN) 0.5 MG tablet TAKE 1 TABLET BY MOUTH AT BEDTIME AS NEEDED 07/26/15   Megan P Johnson, DO  diclofenac sodium (VOLTAREN) 1 % GEL Apply 4 g topically 4 (four) times daily. 06/22/15   Megan P Johnson, DO  fluticasone (FLONASE) 50 MCG/ACT nasal spray Place 2 sprays into the nose daily.    Historical Provider, MD  hydrALAZINE (APRESOLINE) 25 MG tablet Take 1 tablet (25 mg total) by mouth 2 (two) times daily. 06/22/15   Megan P Johnson, DO  NIACIN PO Take 1,000 mg by mouth daily.    Historical Provider, MD  pantoprazole (PROTONIX) 40 MG tablet Take 1 tablet (40 mg total) by mouth daily. 07/01/15   Megan P Johnson, DO  predniSONE (DELTASONE) 10 MG tablet 6 pills today, decrease to 5 pills tomorrow, decrease by 1 each day until gone 08/02/15   Megan P Johnson, DO  sertraline (ZOLOFT) 50 MG tablet Take 0.5 tablets (25 mg total) by mouth daily. 06/22/15   Megan P Johnson, DO  traMADol (ULTRAM) 50 MG tablet Take 1 tablet (50 mg total) by mouth every 8 (eight) hours as needed. 08/02/15   Megan Annia Friendly, DO   Meds Ordered and Administered this Visit  Medications - No data to display  BP 128/87 mmHg  Pulse 76  Temp(Src) 98 F (36.7 C) (Oral)  Resp 16  Ht 4\' 11"  (1.499 m)  Wt 145 lb (65.772 kg)  BMI 29.27 kg/m2  SpO2 98% No data found.   Physical Exam  Constitutional: She is oriented to person, place, and time. She appears well-developed and well-nourished. No distress.  HENT:  Head: Normocephalic and atraumatic.  Right Ear: External ear normal.  Left Ear: External ear normal.  Nose: Nose normal.  Mouth/Throat: Oropharynx is clear and moist. No oropharyngeal exudate.  Eyes: Conjunctivae are normal. Pupils are equal, round, and reactive to light.  Neck: Normal range of motion. Neck supple.  Pulmonary/Chest: Breath sounds normal. No stridor. No respiratory distress. She has no wheezes. She has no rales.  Musculoskeletal: Normal range of  motion. She exhibits no edema or tenderness.  Lymphadenopathy:    She has no cervical adenopathy.  Neurological: She is alert and oriented to person, place, and time.  Skin: Skin is warm and dry. She is not diaphoretic.  Psychiatric: She has a normal mood and affect. Her behavior is normal. Judgment and thought content normal.  Nursing note and vitals reviewed.   ED Course  Procedures (including critical care time)  Labs Review Labs Reviewed - No data to display  Imaging Review No results found.   Visual Acuity Review  Right Eye  Distance:   Left Eye Distance:   Bilateral Distance:    Right Eye Near:   Left Eye Near:    Bilateral Near:         MDM   1. Acute URI    Discharge Medication List as of 08/30/2015 11:38 AM    START taking these medications   Details  cetirizine-pseudoephedrine (ZYRTEC-D) 5-120 MG tablet Take 1 tablet by mouth 2 (two) times daily., Starting 08/30/2015, Until Discontinued, Normal    chlorpheniramine-HYDROcodone (TUSSIONEX PENNKINETIC ER) 10-8 MG/5ML SUER Take 5 mLs by mouth 2 (two) times daily., Starting 08/30/2015, Until Discontinued, Print      Plan: 1. Diagnosis reviewed with patient 2. rx as per orders; risks, benefits, potential side effects reviewed with patient 3. Recommend supportive treatment with rest and fluids. Commended a cool mist vaporizer at nighttime with sleeping. Caution to her regarding activities that require concentration or judgment or driving at automobile while on the  tussionex 4. F/u prn if symptoms worsen or don't improve     Lorin Picket, PA-C 08/30/15 1148

## 2015-08-30 NOTE — ED Notes (Signed)
Patient c/o cough and chest congestion for a week.  

## 2015-09-01 ENCOUNTER — Telehealth: Payer: Self-pay

## 2015-09-01 NOTE — Telephone Encounter (Signed)
Patient notified

## 2015-09-01 NOTE — Telephone Encounter (Signed)
It's ok to take it for a week, if she's still sick after a week, let me know

## 2015-09-01 NOTE — Telephone Encounter (Signed)
Patient went to Urgent Care on Monday, she was diagnosed with a URI, they gave her Zyrtec-D. She is wanting to know is she to take a decongestant since she has kidney disease.

## 2015-09-10 ENCOUNTER — Other Ambulatory Visit: Payer: Self-pay | Admitting: Family Medicine

## 2015-09-14 DIAGNOSIS — H2513 Age-related nuclear cataract, bilateral: Secondary | ICD-10-CM | POA: Diagnosis not present

## 2015-09-14 DIAGNOSIS — H10013 Acute follicular conjunctivitis, bilateral: Secondary | ICD-10-CM | POA: Diagnosis not present

## 2015-10-26 ENCOUNTER — Other Ambulatory Visit: Payer: Self-pay | Admitting: Family Medicine

## 2015-11-05 ENCOUNTER — Other Ambulatory Visit: Payer: Self-pay | Admitting: Family Medicine

## 2015-11-05 NOTE — Telephone Encounter (Signed)
Please call in [place]. Thanks.

## 2015-11-05 NOTE — Telephone Encounter (Signed)
Called into [facility] [place]. 

## 2015-11-09 ENCOUNTER — Telehealth: Payer: Self-pay | Admitting: Family Medicine

## 2015-11-09 NOTE — Telephone Encounter (Signed)
Pt stated she has called in a refill for Librax. Pt stated the pharmacy had faxed over a refill request and have not received a response yet. Pharm is [facility] in [place]. Please send RX ASAP as pt is out of medication. Pt stated she just needs enough to last until her CPE appt in April. Thanks.

## 2015-11-09 NOTE — Telephone Encounter (Signed)
Spoke with patient, notified her that the medication was verbally called in on 11/05/15.

## 2015-12-06 DIAGNOSIS — Z85828 Personal history of other malignant neoplasm of skin: Secondary | ICD-10-CM | POA: Diagnosis not present

## 2015-12-06 DIAGNOSIS — Z872 Personal history of diseases of the skin and subcutaneous tissue: Secondary | ICD-10-CM | POA: Diagnosis not present

## 2015-12-06 DIAGNOSIS — Z1283 Encounter for screening for malignant neoplasm of skin: Secondary | ICD-10-CM | POA: Diagnosis not present

## 2015-12-06 DIAGNOSIS — Z08 Encounter for follow-up examination after completed treatment for malignant neoplasm: Secondary | ICD-10-CM | POA: Diagnosis not present

## 2015-12-06 DIAGNOSIS — Z09 Encounter for follow-up examination after completed treatment for conditions other than malignant neoplasm: Secondary | ICD-10-CM | POA: Diagnosis not present

## 2015-12-21 ENCOUNTER — Ambulatory Visit (INDEPENDENT_AMBULATORY_CARE_PROVIDER_SITE_OTHER): Payer: Medicare Other | Admitting: Family Medicine

## 2015-12-21 ENCOUNTER — Encounter (INDEPENDENT_AMBULATORY_CARE_PROVIDER_SITE_OTHER): Payer: Self-pay

## 2015-12-21 ENCOUNTER — Encounter: Payer: Self-pay | Admitting: Family Medicine

## 2015-12-21 VITALS — BP 135/84 | HR 60 | Temp 98.5°F | Ht 59.1 in | Wt 146.0 lb

## 2015-12-21 DIAGNOSIS — I739 Peripheral vascular disease, unspecified: Secondary | ICD-10-CM | POA: Diagnosis not present

## 2015-12-21 DIAGNOSIS — I10 Essential (primary) hypertension: Secondary | ICD-10-CM

## 2015-12-21 DIAGNOSIS — M25512 Pain in left shoulder: Secondary | ICD-10-CM | POA: Diagnosis not present

## 2015-12-21 DIAGNOSIS — Z0000 Encounter for general adult medical examination without abnormal findings: Secondary | ICD-10-CM

## 2015-12-21 DIAGNOSIS — R918 Other nonspecific abnormal finding of lung field: Secondary | ICD-10-CM | POA: Diagnosis not present

## 2015-12-21 DIAGNOSIS — I2510 Atherosclerotic heart disease of native coronary artery without angina pectoris: Secondary | ICD-10-CM | POA: Diagnosis not present

## 2015-12-21 DIAGNOSIS — M25511 Pain in right shoulder: Secondary | ICD-10-CM

## 2015-12-21 DIAGNOSIS — Z1211 Encounter for screening for malignant neoplasm of colon: Secondary | ICD-10-CM

## 2015-12-21 DIAGNOSIS — K219 Gastro-esophageal reflux disease without esophagitis: Secondary | ICD-10-CM

## 2015-12-21 DIAGNOSIS — Z23 Encounter for immunization: Secondary | ICD-10-CM | POA: Diagnosis not present

## 2015-12-21 DIAGNOSIS — E785 Hyperlipidemia, unspecified: Secondary | ICD-10-CM | POA: Diagnosis not present

## 2015-12-21 DIAGNOSIS — L900 Lichen sclerosus et atrophicus: Secondary | ICD-10-CM

## 2015-12-21 DIAGNOSIS — M109 Gout, unspecified: Secondary | ICD-10-CM

## 2015-12-21 DIAGNOSIS — Z1239 Encounter for other screening for malignant neoplasm of breast: Secondary | ICD-10-CM | POA: Diagnosis not present

## 2015-12-21 DIAGNOSIS — I129 Hypertensive chronic kidney disease with stage 1 through stage 4 chronic kidney disease, or unspecified chronic kidney disease: Secondary | ICD-10-CM | POA: Diagnosis not present

## 2015-12-21 DIAGNOSIS — S0180XA Unspecified open wound of other part of head, initial encounter: Secondary | ICD-10-CM

## 2015-12-21 LAB — MICROALBUMIN, URINE WAIVED
CREATININE, URINE WAIVED: 100 mg/dL (ref 10–300)
Microalb, Ur Waived: 30 mg/L — ABNORMAL HIGH (ref 0–19)
Microalb/Creat Ratio: <30 mg/g (ref <30)

## 2015-12-21 LAB — UA/M W/RFLX CULTURE, ROUTINE
Bilirubin, UA: NEGATIVE
Glucose, UA: NEGATIVE
Ketones, UA: NEGATIVE
Leukocytes, UA: NEGATIVE
NITRITE UA: NEGATIVE
Protein, UA: NEGATIVE
RBC, UA: NEGATIVE
Specific Gravity, UA: 1.01 (ref 1.005–1.030)
UUROB: 0.2 mg/dL (ref 0.2–1.0)
pH, UA: 5.5 (ref 5.0–7.5)

## 2015-12-21 MED ORDER — AMLODIPINE BESYLATE 10 MG PO TABS: ORAL_TABLET | ORAL | Status: DC

## 2015-12-21 MED ORDER — BENAZEPRIL HCL 40 MG PO TABS: 40.0000 mg | ORAL_TABLET | Freq: Every day | ORAL | Status: DC

## 2015-12-21 MED ORDER — ATENOLOL 50 MG PO TABS: 50.0000 mg | ORAL_TABLET | Freq: Every day | ORAL | Status: DC

## 2015-12-21 MED ORDER — ATORVASTATIN CALCIUM 40 MG PO TABS: 40.0000 mg | ORAL_TABLET | Freq: Every day | ORAL | Status: DC

## 2015-12-21 MED ORDER — HYDRALAZINE HCL 25 MG PO TABS: ORAL_TABLET | ORAL | Status: DC

## 2015-12-21 MED ORDER — PANTOPRAZOLE SODIUM 40 MG PO TBEC: DELAYED_RELEASE_TABLET | ORAL | Status: DC

## 2015-12-21 MED ORDER — CLONAZEPAM 0.5 MG PO TABS: 0.5000 mg | ORAL_TABLET | Freq: Every evening | ORAL | Status: DC | PRN

## 2015-12-21 MED ORDER — SERTRALINE HCL 50 MG PO TABS: 25.0000 mg | ORAL_TABLET | Freq: Every day | ORAL | Status: DC

## 2015-12-21 NOTE — Assessment & Plan Note (Signed)
Under good control. Continue current regimen. Continue to monitor.  

## 2015-12-21 NOTE — Patient Instructions (Addendum)
Preventative Services:  Health Risk Assessment and Personalized Prevention Plan: Done Today Bone Mass Measurements: Up to date Breast Cancer Screening: up to date CVD Screening: Done today Cervical Cancer Screening: N/A Colon Cancer Screening:  Depression Screening: Done today Diabetes Screening: Done today Glaucoma Screening: See your Eye doctor Hepatitis B vaccine: N/A Hepatitis C screening: Up to date HIV Screening: up to date Flu Vaccine: up to date Lung cancer Screening: Obesity Screening: done today Pneumonia Vaccines (2): up to date STI Screening: N/A  Health Maintenance, Female Adopting a healthy lifestyle and getting preventive care can go a long way to promote health and wellness. Talk with your health care provider about what schedule of regular examinations is right for you. This is a good chance for you to check in with your provider about disease prevention and staying healthy. In between checkups, there are plenty of things you can do on your own. Experts have done a lot of research about which lifestyle changes and preventive measures are most likely to keep you healthy. Ask your health care provider for more information. WEIGHT AND DIET  Eat a healthy diet  Be sure to include plenty of vegetables, fruits, low-fat dairy products, and lean protein.  Do not eat a lot of foods high in solid fats, added sugars, or salt.  Get regular exercise. This is one of the most important things you can do for your health.  Most adults should exercise for at least 150 minutes each week. The exercise should increase your heart rate and make you sweat (moderate-intensity exercise).  Most adults should also do strengthening exercises at least twice a week. This is in addition to the moderate-intensity exercise.  Maintain a healthy weight  Body mass index (BMI) is a measurement that can be used to identify possible weight problems. It estimates body fat based on height and weight.  Your health care provider can help determine your BMI and help you achieve or maintain a healthy weight.  For females 11 years of age and older:   A BMI below 18.5 is considered underweight.  A BMI of 18.5 to 24.9 is normal.  A BMI of 25 to 29.9 is considered overweight.  A BMI of 30 and above is considered obese.  Watch levels of cholesterol and blood lipids  You should start having your blood tested for lipids and cholesterol at 69 years of age, then have this test every 5 years.  You may need to have your cholesterol levels checked more often if:  Your lipid or cholesterol levels are high.  You are older than 69 years of age.  You are at high risk for heart disease.  CANCER SCREENING   Lung Cancer  Lung cancer screening is recommended for adults 71-62 years old who are at high risk for lung cancer because of a history of smoking.  A yearly low-dose CT scan of the lungs is recommended for people who:  Currently smoke.  Have quit within the past 15 years.  Have at least a 30-pack-year history of smoking. A pack year is smoking an average of one pack of cigarettes a day for 1 year.  Yearly screening should continue until it has been 15 years since you quit.  Yearly screening should stop if you develop a health problem that would prevent you from having lung cancer treatment.  Breast Cancer  Practice breast self-awareness. This means understanding how your breasts normally appear and feel.  It also means doing regular breast self-exams. Let  your health care provider know about any changes, no matter how small.  If you are in your 20s or 30s, you should have a clinical breast exam (CBE) by a health care provider every 1-3 years as part of a regular health exam.  If you are 64 or older, have a CBE every year. Also consider having a breast X-ray (mammogram) every year.  If you have a family history of breast cancer, talk to your health care provider about genetic  screening.  If you are at high risk for breast cancer, talk to your health care provider about having an MRI and a mammogram every year.  Breast cancer gene (BRCA) assessment is recommended for women who have family members with BRCA-related cancers. BRCA-related cancers include:  Breast.  Ovarian.  Tubal.  Peritoneal cancers.  Results of the assessment will determine the need for genetic counseling and BRCA1 and BRCA2 testing. Cervical Cancer Your health care provider may recommend that you be screened regularly for cancer of the pelvic organs (ovaries, uterus, and vagina). This screening involves a pelvic examination, including checking for microscopic changes to the surface of your cervix (Pap test). You may be encouraged to have this screening done every 3 years, beginning at age 24.  For women ages 67-65, health care providers may recommend pelvic exams and Pap testing every 3 years, or they may recommend the Pap and pelvic exam, combined with testing for human papilloma virus (HPV), every 5 years. Some types of HPV increase your risk of cervical cancer. Testing for HPV may also be done on women of any age with unclear Pap test results.  Other health care providers may not recommend any screening for nonpregnant women who are considered low risk for pelvic cancer and who do not have symptoms. Ask your health care provider if a screening pelvic exam is right for you.  If you have had past treatment for cervical cancer or a condition that could lead to cancer, you need Pap tests and screening for cancer for at least 20 years after your treatment. If Pap tests have been discontinued, your risk factors (such as having a new sexual partner) need to be reassessed to determine if screening should resume. Some women have medical problems that increase the chance of getting cervical cancer. In these cases, your health care provider may recommend more frequent screening and Pap tests. Colorectal  Cancer  This type of cancer can be detected and often prevented.  Routine colorectal cancer screening usually begins at 69 years of age and continues through 69 years of age.  Your health care provider may recommend screening at an earlier age if you have risk factors for colon cancer.  Your health care provider may also recommend using home test kits to check for hidden blood in the stool.  A small camera at the end of a tube can be used to examine your colon directly (sigmoidoscopy or colonoscopy). This is done to check for the earliest forms of colorectal cancer.  Routine screening usually begins at age 57.  Direct examination of the colon should be repeated every 5-10 years through 69 years of age. However, you may need to be screened more often if early forms of precancerous polyps or small growths are found. Skin Cancer  Check your skin from head to toe regularly.  Tell your health care provider about any new moles or changes in moles, especially if there is a change in a mole's shape or color.  Also tell your  health care provider if you have a mole that is larger than the size of a pencil eraser.  Always use sunscreen. Apply sunscreen liberally and repeatedly throughout the day.  Protect yourself by wearing long sleeves, pants, a wide-brimmed hat, and sunglasses whenever you are outside. HEART DISEASE, DIABETES, AND HIGH BLOOD PRESSURE   High blood pressure causes heart disease and increases the risk of stroke. High blood pressure is more likely to develop in:  People who have blood pressure in the high end of the normal range (130-139/85-89 mm Hg).  People who are overweight or obese.  People who are African American.  If you are 52-44 years of age, have your blood pressure checked every 3-5 years. If you are 43 years of age or older, have your blood pressure checked every year. You should have your blood pressure measured twice--once when you are at a hospital or clinic,  and once when you are not at a hospital or clinic. Record the average of the two measurements. To check your blood pressure when you are not at a hospital or clinic, you can use:  An automated blood pressure machine at a pharmacy.  A home blood pressure monitor.  If you are between 10 years and 7 years old, ask your health care provider if you should take aspirin to prevent strokes.  Have regular diabetes screenings. This involves taking a blood sample to check your fasting blood sugar level.  If you are at a normal weight and have a low risk for diabetes, have this test once every three years after 69 years of age.  If you are overweight and have a high risk for diabetes, consider being tested at a younger age or more often. PREVENTING INFECTION  Hepatitis B  If you have a higher risk for hepatitis B, you should be screened for this virus. You are considered at high risk for hepatitis B if:  You were born in a country where hepatitis B is common. Ask your health care provider which countries are considered high risk.  Your parents were born in a high-risk country, and you have not been immunized against hepatitis B (hepatitis B vaccine).  You have HIV or AIDS.  You use needles to inject street drugs.  You live with someone who has hepatitis B.  You have had sex with someone who has hepatitis B.  You get hemodialysis treatment.  You take certain medicines for conditions, including cancer, organ transplantation, and autoimmune conditions. Hepatitis C  Blood testing is recommended for:  Everyone born from [birth year] through 1964.  Anyone with known risk factors for hepatitis C. Sexually transmitted infections (STIs)  You should be screened for sexually transmitted infections (STIs) including gonorrhea and chlamydia if:  You are sexually active and are younger than 69 years of age.  You are older than 69 years of age and your health care provider tells you that you are at risk  for this type of infection.  Your sexual activity has changed since you were last screened and you are at an increased risk for chlamydia or gonorrhea. Ask your health care provider if you are at risk.  If you do not have HIV, but are at risk, it may be recommended that you take a prescription medicine daily to prevent HIV infection. This is called pre-exposure prophylaxis (PrEP). You are considered at risk if:  You are sexually active and do not regularly use condoms or know the HIV status of your partner(s).  You  take drugs by injection.  You are sexually active with a partner who has HIV. Talk with your health care provider about whether you are at high risk of being infected with HIV. If you choose to begin PrEP, you should first be tested for HIV. You should then be tested every 3 months for as long as you are taking PrEP.  PREGNANCY   If you are premenopausal and you may become pregnant, ask your health care provider about preconception counseling.  If you may become pregnant, take 400 to 800 micrograms (mcg) of folic acid every day.  If you want to prevent pregnancy, talk to your health care provider about birth control (contraception). OSTEOPOROSIS AND MENOPAUSE   Osteoporosis is a disease in which the bones lose minerals and strength with aging. This can result in serious bone fractures. Your risk for osteoporosis can be identified using a bone density scan.  If you are 78 years of age or older, or if you are at risk for osteoporosis and fractures, ask your health care provider if you should be screened.  Ask your health care provider whether you should take a calcium or vitamin D supplement to lower your risk for osteoporosis.  Menopause may have certain physical symptoms and risks.  Hormone replacement therapy may reduce some of these symptoms and risks. Talk to your health care provider about whether hormone replacement therapy is right for you.  HOME CARE INSTRUCTIONS    Schedule regular health, dental, and eye exams.  Stay current with your immunizations.   Do not use any tobacco products including cigarettes, chewing tobacco, or electronic cigarettes.  If you are pregnant, do not drink alcohol.  If you are breastfeeding, limit how much and how often you drink alcohol.  Limit alcohol intake to no more than 1 drink per day for nonpregnant women. One drink equals 12 ounces of beer, 5 ounces of wine, or 1 ounces of hard liquor.  Do not use street drugs.  Do not share needles.  Ask your health care provider for help if you need support or information about quitting drugs.  Tell your health care provider if you often feel depressed.  Tell your health care provider if you have ever been abused or do not feel safe at home.   This information is not intended to replace advice given to you by your health care provider. Make sure you discuss any questions you have with your health care provider.   Document Released: 02/27/2011 Document Revised: 09/04/2014 Document Reviewed: 07/16/2013 Elsevier Interactive Patient Education [employer]. Menopause is a normal process in which your reproductive ability comes to an end. This process happens gradually over a span of months to years, usually between the ages of 56 and 52. Menopause is complete when you have missed 12 consecutive menstrual periods. It is important to talk with your health care provider about some of the most common conditions that affect postmenopausal women, such as heart disease, cancer, and bone loss (osteoporosis). Adopting a healthy lifestyle and getting preventive care can help to promote your health and wellness. Those actions can also lower your chances of developing some of these common conditions. WHAT SHOULD I KNOW ABOUT MENOPAUSE? During menopause, you may experience a number of symptoms, such as:  Moderate-to-severe hot flashes.  Night sweats.  Decrease in sex  drive.  Mood swings.  Headaches.  Tiredness.  Irritability.  Memory problems.  Insomnia. Choosing to treat or not to treat menopausal changes is an individual  decision that you make with your health care provider. WHAT SHOULD I KNOW ABOUT HORMONE REPLACEMENT THERAPY AND SUPPLEMENTS? Hormone therapy products are effective for treating symptoms that are associated with menopause, such as hot flashes and night sweats. Hormone replacement carries certain risks, especially as you become older. If you are thinking about using estrogen or estrogen with progestin treatments, discuss the benefits and risks with your health care provider. WHAT SHOULD I KNOW ABOUT HEART DISEASE AND STROKE? Heart disease, heart attack, and stroke become more likely as you age. This may be due, in part, to the hormonal changes that your body experiences during menopause. These can affect how your body processes dietary fats, triglycerides, and cholesterol. Heart attack and stroke are both medical emergencies. There are many things that you can do to help prevent heart disease and stroke:  Have your blood pressure checked at least every 1-2 years. High blood pressure causes heart disease and increases the risk of stroke.  If you are 22-73 years old, ask your health care provider if you should take aspirin to prevent a heart attack or a stroke.  Do not use any tobacco products, including cigarettes, chewing tobacco, or electronic cigarettes. If you need help quitting, ask your health care provider.  It is important to eat a healthy diet and maintain a healthy weight.  Be sure to include plenty of vegetables, fruits, low-fat dairy products, and lean protein.  Avoid eating foods that are high in solid fats, added sugars, or salt (sodium).  Get regular exercise. This is one of the most important things that you can do for your health.  Try to exercise for at least 150 minutes each week. The type of exercise that you  do should increase your heart rate and make you sweat. This is known as moderate-intensity exercise.  Try to do strengthening exercises at least twice each week. Do these in addition to the moderate-intensity exercise.  Know your numbers.Ask your health care provider to check your cholesterol and your blood glucose. Continue to have your blood tested as directed by your health care provider. WHAT SHOULD I KNOW ABOUT CANCER SCREENING? There are several types of cancer. Take the following steps to reduce your risk and to catch any cancer development as early as possible. Breast Cancer  Practice breast self-awareness.  This means understanding how your breasts normally appear and feel.  It also means doing regular breast self-exams. Let your health care provider know about any changes, no matter how small.  If you are 36 or older, have a clinician do a breast exam (clinical breast exam or CBE) every year. Depending on your age, family history, and medical history, it may be recommended that you also have a yearly breast X-ray (mammogram).  If you have a family history of breast cancer, talk with your health care provider about genetic screening.  If you are at high risk for breast cancer, talk with your health care provider about having an MRI and a mammogram every year.  Breast cancer (BRCA) gene test is recommended for women who have family members with BRCA-related cancers. Results of the assessment will determine the need for genetic counseling and BRCA1 and for BRCA2 testing. BRCA-related cancers include these types:  Breast. This occurs in males or females.  Ovarian.  Tubal. This may also be called fallopian tube cancer.  Cancer of the abdominal or pelvic lining (peritoneal cancer).  Prostate.  Pancreatic. Cervical, Uterine, and Ovarian Cancer Your health care provider may recommend  that you be screened regularly for cancer of the pelvic organs. These include your ovaries,  uterus, and vagina. This screening involves a pelvic exam, which includes checking for microscopic changes to the surface of your cervix (Pap test).  For women ages 21-65, health care providers may recommend a pelvic exam and a Pap test every three years. For women ages 70-65, they may recommend the Pap test and pelvic exam, combined with testing for human papilloma virus (HPV), every five years. Some types of HPV increase your risk of cervical cancer. Testing for HPV may also be done on women of any age who have unclear Pap test results.  Other health care providers may not recommend any screening for nonpregnant women who are considered low risk for pelvic cancer and have no symptoms. Ask your health care provider if a screening pelvic exam is right for you.  If you have had past treatment for cervical cancer or a condition that could lead to cancer, you need Pap tests and screening for cancer for at least 20 years after your treatment. If Pap tests have been discontinued for you, your risk factors (such as having a new sexual partner) need to be reassessed to determine if you should start having screenings again. Some women have medical problems that increase the chance of getting cervical cancer. In these cases, your health care provider may recommend that you have screening and Pap tests more often.  If you have a family history of uterine cancer or ovarian cancer, talk with your health care provider about genetic screening.  If you have vaginal bleeding after reaching menopause, tell your health care provider.  There are currently no reliable tests available to screen for ovarian cancer. Lung Cancer Lung cancer screening is recommended for adults 110-49 years old who are at high risk for lung cancer because of a history of smoking. A yearly low-dose CT scan of the lungs is recommended if you:  Currently smoke.  Have a history of at least 30 pack-years of smoking and you currently smoke or have  quit within the past 15 years. A pack-year is smoking an average of one pack of cigarettes per day for one year. Yearly screening should:  Continue until it has been 15 years since you quit.  Stop if you develop a health problem that would prevent you from having lung cancer treatment. Colorectal Cancer  This type of cancer can be detected and can often be prevented.  Routine colorectal cancer screening usually begins at age 1 and continues through age 32.  If you have risk factors for colon cancer, your health care provider may recommend that you be screened at an earlier age.  If you have a family history of colorectal cancer, talk with your health care provider about genetic screening.  Your health care provider may also recommend using home test kits to check for hidden blood in your stool.  A small camera at the end of a tube can be used to examine your colon directly (sigmoidoscopy or colonoscopy). This is done to check for the earliest forms of colorectal cancer.  Direct examination of the colon should be repeated every 5-10 years until age 57. However, if early forms of precancerous polyps or small growths are found or if you have a family history or genetic risk for colorectal cancer, you may need to be screened more often. Skin Cancer  Check your skin from head to toe regularly.  Monitor any moles. Be sure to tell your  health care provider:  About any new moles or changes in moles, especially if there is a change in a mole's shape or color.  If you have a mole that is larger than the size of a pencil eraser.  If any of your family members has a history of skin cancer, especially at a young age, talk with your health care provider about genetic screening.  Always use sunscreen. Apply sunscreen liberally and repeatedly throughout the day.  Whenever you are outside, protect yourself by wearing long sleeves, pants, a wide-brimmed hat, and sunglasses. WHAT SHOULD I KNOW  ABOUT OSTEOPOROSIS? Osteoporosis is a condition in which bone destruction happens more quickly than new bone creation. After menopause, you may be at an increased risk for osteoporosis. To help prevent osteoporosis or the bone fractures that can happen because of osteoporosis, the following is recommended:  If you are 51-5 years old, get at least 1,000 mg of calcium and at least 600 mg of vitamin D per day.  If you are older than age 50 but younger than age 67, get at least 1,200 mg of calcium and at least 600 mg of vitamin D per day.  If you are older than age 63, get at least 1,200 mg of calcium and at least 800 mg of vitamin D per day. Smoking and excessive alcohol intake increase the risk of osteoporosis. Eat foods that are rich in calcium and vitamin D, and do weight-bearing exercises several times each week as directed by your health care provider. WHAT SHOULD I KNOW ABOUT HOW MENOPAUSE AFFECTS [place]? Depression may occur at any age, but it is more common as you become older. Common symptoms of depression include:  Low or sad mood.  Changes in sleep patterns.  Changes in appetite or eating patterns.  Feeling an overall lack of motivation or enjoyment of activities that you previously enjoyed.  Frequent crying spells. Talk with your health care provider if you think that you are experiencing depression. WHAT SHOULD I KNOW ABOUT IMMUNIZATIONS? It is important that you get and maintain your immunizations. These include:  Tetanus, diphtheria, and pertussis (Tdap) booster vaccine.  Influenza every year before the flu season begins.  Pneumonia vaccine.  Shingles vaccine. Your health care provider may also recommend other immunizations.   This information is not intended to replace advice given to you by your health care provider. Make sure you discuss any questions you have with your health care provider.   Document Released: 10/06/2005 Document Revised: 09/04/2014  Document Reviewed: 04/16/2014 Elsevier Interactive Patient Education [employer].

## 2015-12-21 NOTE — Assessment & Plan Note (Signed)
Overdue for recheck. Low dose CT ordered again today. Await results.

## 2015-12-21 NOTE — Assessment & Plan Note (Signed)
Checking uric acid today. Await results. Continue to monitor.

## 2015-12-21 NOTE — Progress Notes (Signed)
BP 135/84 mmHg  Pulse 60  Temp(Src) 98.5 F (36.9 C)  Ht 4' 11.1" (1.501 m)  Wt 146 lb (66.225 kg)  BMI 29.39 kg/m2  SpO2 98%   Subjective:    Patient ID: Marie Parker, female    DOB: 01-25-1946, 70 y.o.   MRN: [id number]  HPI: Marie Parker is a 70 y.o. female presenting on 12/21/2015 for comprehensive medical examination. Current medical complaints include: Having a lot of pain in her shoulders, elbows and toes- aching and severe. Has been hurting about a month. Occasional weakness. Nothing makes it better, using them makes it worse. Hasn't really tried anything yet.   HYPERTENSION / HYPERLIPIDEMIA Satisfied with current treatment? yes Duration of hypertension: chronic BP monitoring frequency: not checking BP medication side effects: no Duration of hyperlipidemia: chronic Cholesterol medication side effects: no Cholesterol supplements: none Medication compliance: excellent compliance Aspirin: yes Recent stressors: no Recurrent headaches: no Visual changes: no Palpitations: no Dyspnea: no Chest pain: no Lower extremity edema: no Dizzy/lightheaded: no  She currently lives with: husband Menopausal Symptoms: yes  Functional Status Survey: Is the patient deaf or have difficulty hearing?: No Does the patient have difficulty seeing, even when wearing glasses/contacts?: No Does the patient have difficulty concentrating, remembering, or making decisions?: No Does the patient have difficulty walking or climbing stairs?: No Does the patient have difficulty dressing or bathing?: No Does the patient have difficulty doing errands alone such as visiting a doctor's office or shopping?: No  Depression Screen Depression screen PHQ 2/9 12/21/2015  Decreased Interest 0  Down, Depressed, Hopeless 0  PHQ - 2 Score 0   Advanced Directives Does patient have a HCPOA?    no Does patient have a living will or MOST form?  yes  Past Medical History:  Past Medical History  Diagnosis  Date  . Menopause   . GERD (gastroesophageal reflux disease)   . Generalized osteoarthritis   . Arthritis   . Osteoporosis   . CAD (coronary artery disease)   . Gout   . Hypertension   . Hyperlipidemia   . Parathyroid adenoma   . PVD (peripheral vascular disease) ([place])   . Tobacco abuse   . Chronic kidney disease   . Depression   . Rotator cuff syndrome of shoulder and allied disorders   . Anxiety   . Insomnia   . IFG (impaired fasting glucose)   . PVD (peripheral vascular disease) ([place])   . History of endoscopy 11/2012    Gastritis    Surgical History:  Past Surgical History  Procedure Laterality Date  . Iliac artery stent      right iliac artery stent   . Parathyroidectomy    . Cardiac catheterization  09/17/2007    Insignificant CAD with 40-50% stenosis of mid LAD.   [name] Breast fibroadenoma surgery    . Skin biopsy      precancerous    Medications:  Current Outpatient Prescriptions on File Prior to Visit  Medication Sig  . aspirin 81 MG tablet Take 1 tablet (81 mg total) by mouth daily.  . benzonatate (TESSALON) 200 MG capsule TAKE 1 CAPSULE(200 MG) BY MOUTH TWICE DAILY AS NEEDED FOR COUGH  . clidinium-chlordiazePOXIDE (LIBRAX) 5-2.5 MG capsule TAKE 1 CAPSULE BY MOUTH TWICE DAILY BEFORE LUNCH AND SUPPER  . clobetasol ointment (TEMOVATE) 0.05 % Daily at night for 6-12 weeks, if improvement, decrease to 2-3x per week  . fluticasone (FLONASE) 50 MCG/ACT nasal spray Place 2 sprays into the nose daily.  [name]  NIACIN PO Take 1,000 mg by mouth daily.  . traMADol (ULTRAM) 50 MG tablet Take 1 tablet (50 mg total) by mouth every 8 (eight) hours as needed.   No current facility-administered medications on file prior to visit.    Allergies:  Allergies  Allergen Reactions  . Crestor [Rosuvastatin]   . Cyclobenzaprine     Other reaction(s): OTHER  . Erythromycin     GI upset   . Fosamax [Alendronate Sodium]   . Neosporin [Neomycin-Bacitracin Zn-Polymyx]   . Tramadol Hcl  Nausea And Vomiting    Social History:  Social History   Social History  . Marital Status: Married    Spouse Name: N/A  . Number of Children: N/A  . Years of Education: N/A   Occupational History  . Not on file.   Social History Main Topics  . Smoking status: Former Smoker -- 1.00 packs/day for 42 years    Types: Cigarettes    Quit date: 10/09/2001  . Smokeless tobacco: Never Used  . Alcohol Use: No  . Drug Use: No  . Sexual Activity: No   Other Topics Concern  . Not on file   Social History Narrative   History  Smoking status  . Former Smoker -- 1.00 packs/day for 42 years  . Types: Cigarettes  . Quit date: 10/09/2001  Smokeless tobacco  . Never Used   History  Alcohol Use No    Family History:  Family History  Problem Relation Age of Onset  . Heart attack Father 14  . Alcohol abuse Father   . Cancer Father   . Heart disease Father   . Liver disease Father   . Hypertension Father   . Heart disease Sister 51    CAD; s/p stent placement  . Stroke Sister   . AAA (abdominal aortic aneurysm) Sister   . Dementia Sister   . Stroke Mother   . Hypertension Mother   . Diabetes Mother   . Stroke Maternal Grandmother   . Heart attack Maternal Grandfather   . Stroke Paternal Grandmother     Past medical history, surgical history, medications, allergies, family history and social history reviewed with patient today and changes made to appropriate areas of the chart.   Review of Systems  Constitutional: Negative.   HENT: Negative.   Eyes: Negative.   Respiratory: Negative.   Cardiovascular: Negative.   Gastrointestinal: Positive for heartburn. Negative for nausea, vomiting, abdominal pain, diarrhea, constipation, blood in stool and melena.  Genitourinary: Negative.   Musculoskeletal: Positive for myalgias and joint pain. Negative for back pain, falls and neck pain.  Skin: Negative.   Neurological: Negative.   Endo/Heme/Allergies: Positive for  environmental allergies. Negative for polydipsia. Bruises/bleeds easily.  Psychiatric/Behavioral: Negative.     All other ROS negative except what is listed above and in the HPI.      Objective:    BP 135/84 mmHg  Pulse 60  Temp(Src) 98.5 F (36.9 C)  Ht 4' 11.1" (1.501 m)  Wt 146 lb (66.225 kg)  BMI 29.39 kg/m2  SpO2 98%  Wt Readings from Last 3 Encounters:  12/21/15 146 lb (66.225 kg)  08/30/15 145 lb (65.772 kg)  08/02/15 148 lb (67.132 kg)     Hearing Screening   125Hz  250Hz  500Hz  1000Hz  2000Hz  4000Hz  8000Hz   Right ear:   40 40 40 40   Left ear:   20 20 20  Fail     Visual Acuity Screening   Right eye Left eye Both eyes  Without correction:     With correction: 20/40 20/40 20/20     Physical Exam  Constitutional: She is oriented to person, place, and time. She appears well-developed and well-nourished. No distress.  HENT:  Head: Normocephalic and atraumatic.  Right Ear: Hearing and external ear normal.  Left Ear: Hearing and external ear normal.  Nose: Nose normal.  Mouth/Throat: Oropharynx is clear and moist. No oropharyngeal exudate.  Eyes: Conjunctivae, EOM and lids are normal. Pupils are equal, round, and reactive to light. Right eye exhibits no discharge. Left eye exhibits no discharge. No scleral icterus.  Neck: Normal range of motion. Neck supple. No JVD present. No tracheal deviation present. No thyromegaly present.  Cardiovascular: Normal rate, regular rhythm, normal heart sounds and intact distal pulses.  Exam reveals no gallop and no friction rub.   No murmur heard. Pulmonary/Chest: Effort normal and breath sounds normal. No stridor. No respiratory distress. She has no wheezes. She has no rales. She exhibits no tenderness.  Abdominal: Soft. Bowel sounds are normal. She exhibits no distension and no mass. There is no tenderness. There is no rebound and no guarding.  Genitourinary:  GYN and Breast Exams referred with shared decision making  Musculoskeletal:  Normal range of motion. She exhibits no edema or tenderness.  Lymphadenopathy:    She has no cervical adenopathy.  Neurological: She is alert and oriented to person, place, and time. She has normal reflexes. She displays normal reflexes. No cranial nerve deficit. She exhibits normal muscle tone. Coordination normal.  Skin: Skin is warm, dry and intact. No rash noted. She is not diaphoretic. No erythema. No pallor.  Well healing wound on R side of nose  Psychiatric: She has a normal mood and affect. Her speech is normal and behavior is normal. Judgment and thought content normal. Cognition and memory are normal.  Nursing note and vitals reviewed.   Cognitive Testing - 6-CIT  Correct? Score   What year is it? yes 0 Yes = 0    No = 4  What month is it? yes 0 Yes = 0    No = 3  Remember:     Pia Mau, [place], [place]     What time is it? yes 0 Yes = 0    No = 3  Count backwards from 20 to 1 yes 0 Correct = 0    1 error = 2   More than 1 error = 4  Say the months of the year in reverse. yes 0 Correct = 0    1 error = 2   More than 1 error = 4  What address did I ask you to remember? yes 8 Correct = 0  1 error = 2    2 error = 4    3 error = 6    4 error = 8    All wrong = 10       TOTAL SCORE  8/28   Interpretation:  Abnormal- not focusing as much  Normal (0-7) Abnormal (8-28)   Results for orders placed or performed in visit on 06/22/15  Lipid Panel Piccolo, [employer] [facility]  Result Value Ref Range   Cholesterol Piccolo, Waived 133 <200 mg/dL   HDL Chol Piccolo, Waived 55 (L) >59 mg/dL   Triglycerides Piccolo,Waived 121 <150 mg/dL   Chol/HDL Ratio Piccolo,Waive 2.4 mg/dL   LDL Chol Calc Piccolo Waived 53 <100 mg/dL   VLDL Chol Calc Piccolo,Waive 24 <30 mg/dL  Comprehensive metabolic panel  Result  Value Ref Range   Glucose 106 (H) 65 - 99 mg/dL   BUN 15 8 - 27 mg/dL   Creatinine, Ser 0.98 0.57 - 1.00 mg/dL   GFR calc non Af Amer 60 >59 mL/min/1.73   GFR calc Af Amer 69 >59  mL/min/1.73   BUN/Creatinine Ratio 15 11 - 26   Sodium 144 136 - 144 mmol/L   Potassium 3.8 3.5 - 5.2 mmol/L   Chloride 103 97 - 106 mmol/L   CO2 24 18 - 29 mmol/L   Calcium 9.3 8.7 - 10.3 mg/dL   Total Protein 6.3 6.0 - 8.5 g/dL   Albumin 4.3 3.6 - 4.8 g/dL   Globulin, Total 2.0 1.5 - 4.5 g/dL   Albumin/Globulin Ratio 2.2 1.1 - 2.5   Bilirubin Total 0.6 0.0 - 1.2 mg/dL   Alkaline Phosphatase 69 39 - 117 IU/L   AST 18 0 - 40 IU/L   ALT 14 0 - 32 IU/L  Microalbumin, Urine Waived  Result Value Ref Range   Microalb, Ur Waived 30 (H) 0 - 19 mg/L   Creatinine, Urine Waived 200 10 - 300 mg/dL   Microalb/Creat Ratio <30 <30 mg/g  Uric acid  Result Value Ref Range   Uric Acid 8.4 (H) 2.5 - 7.1 mg/dL      Assessment & Plan:   Problem List Items Addressed This Visit      Cardiovascular and Mediastinum   Peripheral vascular disease ([place])    Under good control. Continue current regimen. Continue to monitor.       Relevant Medications   hydrALAZINE (APRESOLINE) 25 MG tablet   amLODipine (NORVASC) 10 MG tablet   atenolol (TENORMIN) 50 MG tablet   benazepril (LOTENSIN) 40 MG tablet   atorvastatin (LIPITOR) 40 MG tablet   Other Relevant Orders   UA/M w/rflx Culture, Routine   CAD (coronary artery disease)    Under good control. Continue current regimen. Continue to monitor.       Relevant Medications   hydrALAZINE (APRESOLINE) 25 MG tablet   amLODipine (NORVASC) 10 MG tablet   atenolol (TENORMIN) 50 MG tablet   benazepril (LOTENSIN) 40 MG tablet   atorvastatin (LIPITOR) 40 MG tablet   RESOLVED: HTN (hypertension)    Under good control. Continue current regimen. Continue to monitor.       Relevant Medications   hydrALAZINE (APRESOLINE) 25 MG tablet   amLODipine (NORVASC) 10 MG tablet   atenolol (TENORMIN) 50 MG tablet   benazepril (LOTENSIN) 40 MG tablet   atorvastatin (LIPITOR) 40 MG tablet   Other Relevant Orders   Microalbumin, Urine Waived   TSH   UA/M w/rflx  Culture, Routine     Digestive   GERD (gastroesophageal reflux disease)    Under good control. Continue current regimen. Continue to monitor.       Relevant Medications   pantoprazole (PROTONIX) 40 MG tablet   Other Relevant Orders   CBC with Differential/Platelet     Musculoskeletal and Integument   Lichen sclerosus    Under good control. Continue current regimen. Continue to monitor.         Genitourinary   Benign hypertensive renal disease    Under good control. Continue current regimen. Continue to monitor.         Other   Hyperlipidemia    Under good control. Continue current regimen. Continue to monitor.       Relevant Medications   hydrALAZINE (APRESOLINE) 25 MG tablet   amLODipine (NORVASC) 10 MG  tablet   atenolol (TENORMIN) 50 MG tablet   benazepril (LOTENSIN) 40 MG tablet   atorvastatin (LIPITOR) 40 MG tablet   Other Relevant Orders   Comprehensive metabolic panel   Lipid Panel w/o Chol/HDL Ratio   Gout    Checking uric acid today. Await results. Continue to monitor.       Relevant Orders   Comprehensive metabolic panel   UA/M w/rflx Culture, Routine   Uric acid   Multiple pulmonary nodules determined by computed tomography of lung    Overdue for recheck. Low dose CT ordered again today. Await results.       Relevant Orders   CT CHEST LUNG CA SCREEN LOW DOSE W/O CM    Other Visit Diagnoses    Encounter for Medicare annual wellness exam    -  Primary    Preventative care discussed today.     Bilateral shoulder pain        Concern for possible PMR or tick-disease. Likely arthritis. Checking labs today. Await results. Continue to monitor.     Relevant Orders    Sed Rate (ESR)    Lyme Ab/Western Blot Reflex    Rocky mtn spotted fvr abs pnl(IgG+IgM)    Babesia microti Antibody Panel    Ehrlichia Antibody Panel    Open wound of face, initial encounter        Healing well. Due for td. Ordered today.    Relevant Orders    Td : Tetanus/diphtheria  >7yo Preservative  free (Completed)    Screening for colon cancer        Cologuard ordered today    Relevant Orders    Cologuard    Screening for breast cancer        Mammogram ordered today    Relevant Orders    MM DIGITAL SCREENING BILATERAL        Preventative Services:  Health Risk Assessment and Personalized Prevention Plan: Done Today Bone Mass Measurements: Up to date Breast Cancer Screening: ordered today CVD Screening: Done today Cervical Cancer Screening: N/A Colon Cancer Screening: ordered today Depression Screening: Done today Diabetes Screening: Done today Glaucoma Screening: See your Eye doctor Hepatitis B vaccine: N/A Hepatitis C screening: Up to date HIV Screening: up to date Flu Vaccine: up to date Lung cancer Screening: ordered today Obesity Screening: done today Pneumonia Vaccines (2): up to date STI Screening: N/A  Follow up plan: Return in about 6 months (around 06/21/2016).   LABORATORY TESTING:  - Pap smear: not applicable  IMMUNIZATIONS:   - Tdap: Tetanus vaccination status reviewed: Td vaccination indicated and given today. - Influenza: Up to date - Pneumovax: Up to date - Prevnar: Up to date - Zostavax vaccine: Up to date  SCREENING: -Mammogram: Up to date  - Colonoscopy: cologuard ordered today  - Bone Density: Up to date  -Hearing Test: Ordered today    PATIENT COUNSELING:   Advised to take 1 mg of folate supplement per day if capable of pregnancy.   Sexuality: Discussed sexually transmitted diseases, partner selection, use of condoms, avoidance of unintended pregnancy  and contraceptive alternatives.   Advised to avoid cigarette smoking.  I discussed with the patient that most people either abstain from alcohol or drink within safe limits (<=14/week and <=4 drinks/occasion for males, <=7/weeks and <= 3 drinks/occasion for females) and that the risk for alcohol disorders and other health effects rises proportionally with the  number of drinks per week and how often a drinker exceeds daily limits.  Discussed cessation/primary prevention of drug use and availability of treatment for abuse.   Diet: Encouraged to adjust caloric intake to maintain  or achieve ideal body weight, to reduce intake of dietary saturated fat and total fat, to limit sodium intake by avoiding high sodium foods and not adding table salt, and to maintain adequate dietary potassium and calcium preferably from fresh fruits, vegetables, and low-fat dairy products.    stressed the importance of regular exercise  Injury prevention: Discussed safety belts, safety helmets, smoke detector, smoking near bedding or upholstery.   Dental health: Discussed importance of regular tooth brushing, flossing, and dental visits.    NEXT PREVENTATIVE PHYSICAL DUE IN 1 YEAR. Return in about 6 months (around 06/21/2016).          \

## 2015-12-23 ENCOUNTER — Encounter: Payer: Self-pay | Admitting: Family Medicine

## 2015-12-24 LAB — SEDIMENTATION RATE: Sed Rate: 7 mm/hr (ref 0–40)

## 2015-12-24 LAB — LIPID PANEL W/O CHOL/HDL RATIO
Cholesterol, Total: 127 mg/dL (ref 100–199)
HDL: 45 mg/dL (ref >39)
LDL Calculated: 48 mg/dL (ref 0–99)
Triglycerides: 168 mg/dL — ABNORMAL HIGH (ref 0–149)
VLDL CHOLESTEROL CAL: 34 mg/dL (ref 5–40)

## 2015-12-24 LAB — COMPREHENSIVE METABOLIC PANEL
ALT: 14 IU/L (ref 0–32)
AST: 21 IU/L (ref 0–40)
Albumin/Globulin Ratio: 1.8 (ref 1.2–2.2)
Albumin: 4.4 g/dL (ref 3.6–4.8)
Alkaline Phosphatase: 75 IU/L (ref 39–117)
BILIRUBIN TOTAL: 0.3 mg/dL (ref 0.0–1.2)
BUN/Creatinine Ratio: 10 — ABNORMAL LOW (ref 12–28)
BUN: 10 mg/dL (ref 8–27)
CALCIUM: 9.4 mg/dL (ref 8.7–10.3)
CHLORIDE: 101 mmol/L (ref 96–106)
CO2: 20 mmol/L (ref 18–29)
CREATININE: 1.02 mg/dL — AB (ref 0.57–1.00)
GFR calc non Af Amer: 57 mL/min/{1.73_m2} — ABNORMAL LOW (ref >59)
GFR, EST AFRICAN AMERICAN: 65 mL/min/{1.73_m2} (ref >59)
GLUCOSE: 103 mg/dL — AB (ref 65–99)
Globulin, Total: 2.5 g/dL (ref 1.5–4.5)
Potassium: 4.5 mmol/L (ref 3.5–5.2)
Sodium: 143 mmol/L (ref 134–144)
TOTAL PROTEIN: 6.9 g/dL (ref 6.0–8.5)

## 2015-12-24 LAB — CBC WITH DIFFERENTIAL/PLATELET
BASOS ABS: 0 10*3/uL (ref 0.0–0.2)
Basos: 0 %
EOS (ABSOLUTE): 0.3 10*3/uL (ref 0.0–0.4)
Eos: 5 %
Hematocrit: 38.5 % (ref 34.0–46.6)
Hemoglobin: 12.7 g/dL (ref 11.1–15.9)
IMMATURE GRANS (ABS): 0 10*3/uL (ref 0.0–0.1)
IMMATURE GRANULOCYTES: 0 %
LYMPHS: 29 %
Lymphocytes Absolute: 1.5 10*3/uL (ref 0.7–3.1)
MCH: 29.1 pg (ref 26.6–33.0)
MCHC: 33 g/dL (ref 31.5–35.7)
MCV: 88 fL (ref 79–97)
MONOCYTES: 7 %
Monocytes Absolute: 0.4 10*3/uL (ref 0.1–0.9)
NEUTROS PCT: 59 %
Neutrophils Absolute: 3.1 10*3/uL (ref 1.4–7.0)
PLATELETS: 224 10*3/uL (ref 150–379)
RBC: 4.36 x10E6/uL (ref 3.77–5.28)
RDW: 14.5 % (ref 12.3–15.4)
WBC: 5.3 10*3/uL (ref 3.4–10.8)

## 2015-12-24 LAB — ROCKY MTN SPOTTED FVR ABS PNL(IGG+IGM)
RMSF IGG: NEGATIVE
RMSF IGM: 0.79 {index} (ref 0.00–0.89)

## 2015-12-24 LAB — EHRLICHIA ANTIBODY PANEL
E. CHAFFEENSIS IGG AB: NEGATIVE
E. Chaffeensis (HME) IgM Titer: NEGATIVE
HGE IGM TITER: NEGATIVE
HGE IgG Titer: NEGATIVE

## 2015-12-24 LAB — BABESIA MICROTI ANTIBODY PANEL: Babesia microti IgM: 1:40 {titer} — ABNORMAL HIGH

## 2015-12-24 LAB — URIC ACID: URIC ACID: 7.7 mg/dL — AB (ref 2.5–7.1)

## 2015-12-24 LAB — LYME AB/WESTERN BLOT REFLEX
LYME DISEASE AB, QUANT, IGM: <0.8 index (ref 0.00–0.79)
Lyme IgG/IgM Ab: <0.91 {ISR} (ref 0.00–0.90)

## 2015-12-24 LAB — TSH: TSH: 2.61 u[IU]/mL (ref 0.450–4.500)

## 2015-12-25 ENCOUNTER — Other Ambulatory Visit: Payer: Self-pay | Admitting: Family Medicine

## 2015-12-27 ENCOUNTER — Other Ambulatory Visit: Payer: Self-pay

## 2015-12-27 MED ORDER — FLUTICASONE PROPIONATE 50 MCG/ACT NA SUSP: 2.0000 | Freq: Every day | NASAL | Status: DC

## 2015-12-27 NOTE — Telephone Encounter (Signed)
Routing to provider  

## 2015-12-27 NOTE — Telephone Encounter (Signed)
All of these just refilled on 4/25

## 2015-12-27 NOTE — Telephone Encounter (Signed)
They were all sent to [facility], they need to go to [facility].

## 2016-01-10 ENCOUNTER — Telehealth: Payer: Self-pay | Admitting: Family Medicine

## 2016-01-10 NOTE — Telephone Encounter (Signed)
I will call and give the patient [place]'s number for mammogram. Keri- will you look into the CT please?

## 2016-01-10 NOTE — Telephone Encounter (Signed)
Patient called stating that she had a physical in the end of April and they told her they would call her to make her mammogram appointment and CT of her chest. Please call patient back regarding this, thanks. [phone]

## 2016-01-10 NOTE — Telephone Encounter (Signed)
Called and spoke to patient. Patient stated that she wanted mammogram done in [place] so I gave her the number to [facility] to schedule her mammogram. Patient stated that she probably would not have the CT done if medicare did not cover it. I told her that the person in the office that does our referrals was going to look into it.

## 2016-01-10 NOTE — Telephone Encounter (Signed)
The CT patient is talking about is the Lung Cancer Screening Low Dose.  That referral has been passed to [facility], and he has verified he has it.  Medicare should cover this CT screening because she's right in the 15 year window.   Patient notified.

## 2016-01-17 DIAGNOSIS — Z1212 Encounter for screening for malignant neoplasm of rectum: Secondary | ICD-10-CM | POA: Diagnosis not present

## 2016-01-17 DIAGNOSIS — Z1211 Encounter for screening for malignant neoplasm of colon: Secondary | ICD-10-CM | POA: Diagnosis not present

## 2016-02-02 ENCOUNTER — Encounter: Payer: Self-pay | Admitting: Family Medicine

## 2016-02-02 ENCOUNTER — Other Ambulatory Visit: Payer: Self-pay | Admitting: Family Medicine

## 2016-02-02 DIAGNOSIS — Z87891 Personal history of nicotine dependence: Secondary | ICD-10-CM

## 2016-02-02 HISTORY — DX: Personal history of nicotine dependence: Z87.891

## 2016-02-02 LAB — COLOGUARD: COLOGUARD: NEGATIVE

## 2016-02-04 ENCOUNTER — Telehealth: Payer: Self-pay | Admitting: *Deleted

## 2016-02-04 NOTE — Telephone Encounter (Signed)
Notified patient that it is time to have lung cancer screening scan done. Verified patient's age, no signs of lung cancer, no illness that would prevent patient from receiving treatment for lung cancer, and smoking history (40 pack year history, quit in 2002). Patient is expecting call from scheduling with appointment for screening scan and knows to call me with any questions. Shared decision making visit was done 03/05/15.

## 2016-02-09 ENCOUNTER — Ambulatory Visit
Admission: RE | Admit: 2016-02-09 | Discharge: 2016-02-09 | Disposition: A | Discharge status: Home / Self Care | Payer: Medicare Other | Source: Ambulatory Visit | Attending: Family Medicine | Admitting: Family Medicine

## 2016-02-09 DIAGNOSIS — Z1231 Encounter for screening mammogram for malignant neoplasm of breast: Secondary | ICD-10-CM | POA: Diagnosis not present

## 2016-02-09 DIAGNOSIS — Z1239 Encounter for other screening for malignant neoplasm of breast: Secondary | ICD-10-CM

## 2016-02-09 HISTORY — DX: Malignant (primary) neoplasm, unspecified: C80.1

## 2016-02-09 LAB — HM MAMMOGRAPHY

## 2016-02-11 ENCOUNTER — Ambulatory Visit
Admission: RE | Admit: 2016-02-11 | Discharge: 2016-02-11 | Disposition: A | Discharge status: Home / Self Care | Payer: Medicare Other | Source: Ambulatory Visit | Attending: Family Medicine | Admitting: Family Medicine

## 2016-02-11 DIAGNOSIS — I2510 Atherosclerotic heart disease of native coronary artery without angina pectoris: Secondary | ICD-10-CM | POA: Insufficient documentation

## 2016-02-11 DIAGNOSIS — Z87891 Personal history of nicotine dependence: Secondary | ICD-10-CM

## 2016-02-11 DIAGNOSIS — Z98891 History of uterine scar from previous surgery: Secondary | ICD-10-CM | POA: Diagnosis not present

## 2016-02-21 ENCOUNTER — Encounter: Payer: Self-pay | Admitting: Family Medicine

## 2016-03-21 DIAGNOSIS — S3993XA Unspecified injury of pelvis, initial encounter: Secondary | ICD-10-CM | POA: Diagnosis not present

## 2016-03-21 DIAGNOSIS — J9811 Atelectasis: Secondary | ICD-10-CM | POA: Diagnosis not present

## 2016-03-21 DIAGNOSIS — M24562 Contracture, left knee: Secondary | ICD-10-CM | POA: Diagnosis not present

## 2016-03-21 DIAGNOSIS — M25562 Pain in left knee: Secondary | ICD-10-CM | POA: Diagnosis not present

## 2016-03-21 DIAGNOSIS — K219 Gastro-esophageal reflux disease without esophagitis: Secondary | ICD-10-CM | POA: Diagnosis not present

## 2016-03-21 DIAGNOSIS — R0989 Other specified symptoms and signs involving the circulatory and respiratory systems: Secondary | ICD-10-CM | POA: Diagnosis not present

## 2016-03-21 DIAGNOSIS — E785 Hyperlipidemia, unspecified: Secondary | ICD-10-CM | POA: Diagnosis not present

## 2016-03-21 DIAGNOSIS — S82032A Displaced transverse fracture of left patella, initial encounter for closed fracture: Secondary | ICD-10-CM | POA: Diagnosis not present

## 2016-03-21 DIAGNOSIS — S79922A Unspecified injury of left thigh, initial encounter: Secondary | ICD-10-CM | POA: Diagnosis not present

## 2016-03-21 DIAGNOSIS — W19XXXA Unspecified fall, initial encounter: Secondary | ICD-10-CM | POA: Diagnosis not present

## 2016-03-21 DIAGNOSIS — S82042A Displaced comminuted fracture of left patella, initial encounter for closed fracture: Secondary | ICD-10-CM | POA: Diagnosis not present

## 2016-03-21 DIAGNOSIS — I447 Left bundle-branch block, unspecified: Secondary | ICD-10-CM | POA: Diagnosis not present

## 2016-03-21 DIAGNOSIS — G8918 Other acute postprocedural pain: Secondary | ICD-10-CM | POA: Diagnosis not present

## 2016-03-21 DIAGNOSIS — Z87891 Personal history of nicotine dependence: Secondary | ICD-10-CM | POA: Diagnosis not present

## 2016-03-21 DIAGNOSIS — S82002A Unspecified fracture of left patella, initial encounter for closed fracture: Secondary | ICD-10-CM | POA: Diagnosis not present

## 2016-03-21 DIAGNOSIS — I10 Essential (primary) hypertension: Secondary | ICD-10-CM | POA: Diagnosis not present

## 2016-03-21 DIAGNOSIS — Z01818 Encounter for other preprocedural examination: Secondary | ICD-10-CM | POA: Diagnosis not present

## 2016-03-21 DIAGNOSIS — R001 Bradycardia, unspecified: Secondary | ICD-10-CM | POA: Diagnosis not present

## 2016-03-22 HISTORY — PX: ORIF PATELLA: SHX5033

## 2016-03-25 DIAGNOSIS — E785 Hyperlipidemia, unspecified: Secondary | ICD-10-CM | POA: Diagnosis not present

## 2016-03-25 DIAGNOSIS — R269 Unspecified abnormalities of gait and mobility: Secondary | ICD-10-CM | POA: Diagnosis not present

## 2016-03-25 DIAGNOSIS — I10 Essential (primary) hypertension: Secondary | ICD-10-CM | POA: Diagnosis not present

## 2016-03-25 DIAGNOSIS — R531 Weakness: Secondary | ICD-10-CM | POA: Diagnosis not present

## 2016-03-25 DIAGNOSIS — Z9181 History of falling: Secondary | ICD-10-CM | POA: Diagnosis not present

## 2016-03-25 DIAGNOSIS — S82042D Displaced comminuted fracture of left patella, subsequent encounter for closed fracture with routine healing: Secondary | ICD-10-CM | POA: Diagnosis not present

## 2016-03-27 ENCOUNTER — Telehealth: Payer: Self-pay | Admitting: Family Medicine

## 2016-03-27 NOTE — Telephone Encounter (Signed)
Pt called to inform that she had surgery, surgeon is supposed to send records related to this. Pt surgery was on her left knee, for a broken knee cap. Please call pt for further information if needed. Thanks.

## 2016-03-27 NOTE — Telephone Encounter (Signed)
Routing to provider. FYI.  

## 2016-03-28 DIAGNOSIS — S82042D Displaced comminuted fracture of left patella, subsequent encounter for closed fracture with routine healing: Secondary | ICD-10-CM | POA: Diagnosis not present

## 2016-03-28 DIAGNOSIS — I10 Essential (primary) hypertension: Secondary | ICD-10-CM | POA: Diagnosis not present

## 2016-03-28 DIAGNOSIS — E785 Hyperlipidemia, unspecified: Secondary | ICD-10-CM | POA: Diagnosis not present

## 2016-03-28 DIAGNOSIS — R531 Weakness: Secondary | ICD-10-CM | POA: Diagnosis not present

## 2016-03-28 DIAGNOSIS — R269 Unspecified abnormalities of gait and mobility: Secondary | ICD-10-CM | POA: Diagnosis not present

## 2016-03-28 DIAGNOSIS — Z9181 History of falling: Secondary | ICD-10-CM | POA: Diagnosis not present

## 2016-03-28 NOTE — Telephone Encounter (Signed)
Already got the information. Thanks!

## 2016-03-30 DIAGNOSIS — E785 Hyperlipidemia, unspecified: Secondary | ICD-10-CM | POA: Diagnosis not present

## 2016-03-30 DIAGNOSIS — R269 Unspecified abnormalities of gait and mobility: Secondary | ICD-10-CM | POA: Diagnosis not present

## 2016-03-30 DIAGNOSIS — I10 Essential (primary) hypertension: Secondary | ICD-10-CM | POA: Diagnosis not present

## 2016-03-30 DIAGNOSIS — R531 Weakness: Secondary | ICD-10-CM | POA: Diagnosis not present

## 2016-03-30 DIAGNOSIS — S82042D Displaced comminuted fracture of left patella, subsequent encounter for closed fracture with routine healing: Secondary | ICD-10-CM | POA: Diagnosis not present

## 2016-03-30 DIAGNOSIS — Z9181 History of falling: Secondary | ICD-10-CM | POA: Diagnosis not present

## 2016-04-03 DIAGNOSIS — Z9181 History of falling: Secondary | ICD-10-CM | POA: Diagnosis not present

## 2016-04-03 DIAGNOSIS — S82042D Displaced comminuted fracture of left patella, subsequent encounter for closed fracture with routine healing: Secondary | ICD-10-CM | POA: Diagnosis not present

## 2016-04-03 DIAGNOSIS — R531 Weakness: Secondary | ICD-10-CM | POA: Diagnosis not present

## 2016-04-03 DIAGNOSIS — E785 Hyperlipidemia, unspecified: Secondary | ICD-10-CM | POA: Diagnosis not present

## 2016-04-03 DIAGNOSIS — R269 Unspecified abnormalities of gait and mobility: Secondary | ICD-10-CM | POA: Diagnosis not present

## 2016-04-03 DIAGNOSIS — I10 Essential (primary) hypertension: Secondary | ICD-10-CM | POA: Diagnosis not present

## 2016-04-08 DIAGNOSIS — S82042D Displaced comminuted fracture of left patella, subsequent encounter for closed fracture with routine healing: Secondary | ICD-10-CM | POA: Diagnosis not present

## 2016-04-08 DIAGNOSIS — R531 Weakness: Secondary | ICD-10-CM | POA: Diagnosis not present

## 2016-04-08 DIAGNOSIS — I10 Essential (primary) hypertension: Secondary | ICD-10-CM | POA: Diagnosis not present

## 2016-04-08 DIAGNOSIS — E785 Hyperlipidemia, unspecified: Secondary | ICD-10-CM | POA: Diagnosis not present

## 2016-04-08 DIAGNOSIS — R269 Unspecified abnormalities of gait and mobility: Secondary | ICD-10-CM | POA: Diagnosis not present

## 2016-04-08 DIAGNOSIS — Z9181 History of falling: Secondary | ICD-10-CM | POA: Diagnosis not present

## 2016-04-18 ENCOUNTER — Encounter: Payer: Self-pay | Admitting: Family Medicine

## 2016-04-18 ENCOUNTER — Ambulatory Visit (INDEPENDENT_AMBULATORY_CARE_PROVIDER_SITE_OTHER): Payer: Medicare Other | Admitting: Family Medicine

## 2016-04-18 VITALS — BP 140/82 | HR 71 | Wt 147.0 lb

## 2016-04-18 DIAGNOSIS — S82002A Unspecified fracture of left patella, initial encounter for closed fracture: Secondary | ICD-10-CM | POA: Diagnosis not present

## 2016-04-18 NOTE — Progress Notes (Signed)
   BP 140/82 (BP Location: Left Arm, Patient Position: Sitting, Cuff Size: Normal)   Pulse 71   Wt 147 lb (66.7 kg)   SpO2 99%   BMI 28.71 kg/m    Subjective:    Patient ID: Marie Parker, female    DOB: May 21, 1946, 70 y.o.   MRN: [id number]  HPI: Marie Parker is a 70 y.o. female  Chief Complaint  Patient presents with  . Hospitalization Follow-up   HOSPITAL FOLLOW UP Time since discharge: 3 weeks Hospital/facility: [facility] Diagnosis: closed displaced comminuted fracture of L patella Procedures/tests: OPEN TREATMENT PATELLAR FRACTURE, W/INTERNAL FIXATION &/OR PARTIAL/COMPLETE PATELLECTOMY & SOFT TISSUE REPAIR on 03/22/2016 Consultants: Orthopedics New medications: tylenol, oxycodone, zofran PRN Discharge instructions:  PT, knee brace, crutches Status: better   Saw ortho again on 04/06/16 for follow up and had been doing very well with therapy. To return in 4 weeks. Sutures were removed. Brace is too big for her, so she's not wearing it. Has been taking it really easy.    Relevant past medical, surgical, family and social history reviewed and updated as indicated. Interim medical history since our last visit reviewed. Allergies and medications reviewed and updated.  Review of Systems  Constitutional: Negative.   Respiratory: Negative.   Cardiovascular: Negative.   Musculoskeletal: Positive for arthralgias. Negative for back pain, gait problem, joint swelling, myalgias, neck pain and neck stiffness.  Psychiatric/Behavioral: Negative.     Per HPI unless specifically indicated above     Objective:    BP 140/82 (BP Location: Left Arm, Patient Position: Sitting, Cuff Size: Normal)   Pulse 71   Wt 147 lb (66.7 kg)   SpO2 99%   BMI 28.71 kg/m   Wt Readings from Last 3 Encounters:  04/18/16 147 lb (66.7 kg)  02/11/16 145 lb (65.8 kg)  12/21/15 146 lb (66.2 kg)    Physical Exam  Constitutional: She is oriented to person, place, and time. She appears well-developed and  well-nourished. No distress.  HENT:  Head: Normocephalic and atraumatic.  Right Ear: Hearing normal.  Left Ear: Hearing normal.  Nose: Nose normal.  Eyes: Conjunctivae and lids are normal. Right eye exhibits no discharge. Left eye exhibits no discharge. No scleral icterus.  Pulmonary/Chest: Effort normal. No respiratory distress.  Musculoskeletal: She exhibits edema and tenderness. She exhibits no deformity.  Small effusion of L knee, well healing surgical wound  Neurological: She is alert and oriented to person, place, and time.  Skin: Skin is warm, dry and intact. No rash noted. She is not diaphoretic. No erythema. No pallor.  Psychiatric: She has a normal mood and affect. Her speech is normal and behavior is normal. Judgment and thought content normal. Cognition and memory are normal.    Results for orders placed or performed in visit on 02/11/16  [facility] MAMMOGRAPHY  Result Value Ref Range   HM Mammogram 0-4 Bi-Rad 0-4 Bi-Rad, Self Reported Normal  Cologuard  Result Value Ref Range   Cologuard Negative       Assessment & Plan:   Problem List Items Addressed This Visit    None    Visit Diagnoses    Patellar fracture, left, closed, initial encounter    -  Primary   Continue to follow with orthopedics. Continue to wear brace. Continue to monitor.        Follow up plan: Return As scheduled.

## 2016-05-04 DIAGNOSIS — M25462 Effusion, left knee: Secondary | ICD-10-CM | POA: Diagnosis not present

## 2016-05-04 DIAGNOSIS — S82042D Displaced comminuted fracture of left patella, subsequent encounter for closed fracture with routine healing: Secondary | ICD-10-CM | POA: Diagnosis not present

## 2016-05-06 ENCOUNTER — Other Ambulatory Visit: Payer: Self-pay | Admitting: Family Medicine

## 2016-05-21 ENCOUNTER — Other Ambulatory Visit: Payer: Self-pay | Admitting: Family Medicine

## 2016-06-21 ENCOUNTER — Encounter: Payer: Self-pay | Admitting: Family Medicine

## 2016-06-21 ENCOUNTER — Other Ambulatory Visit: Payer: Self-pay | Admitting: Family Medicine

## 2016-06-21 ENCOUNTER — Ambulatory Visit (INDEPENDENT_AMBULATORY_CARE_PROVIDER_SITE_OTHER): Payer: Medicare Other | Admitting: Family Medicine

## 2016-06-21 VITALS — BP 134/83 | HR 66 | Temp 98.6°F | Wt 145.6 lb

## 2016-06-21 DIAGNOSIS — F411 Generalized anxiety disorder: Secondary | ICD-10-CM | POA: Insufficient documentation

## 2016-06-21 DIAGNOSIS — K219 Gastro-esophageal reflux disease without esophagitis: Secondary | ICD-10-CM | POA: Diagnosis not present

## 2016-06-21 DIAGNOSIS — L900 Lichen sclerosus et atrophicus: Secondary | ICD-10-CM | POA: Diagnosis not present

## 2016-06-21 DIAGNOSIS — M1A9XX0 Chronic gout, unspecified, without tophus (tophi): Secondary | ICD-10-CM

## 2016-06-21 DIAGNOSIS — Z23 Encounter for immunization: Secondary | ICD-10-CM

## 2016-06-21 DIAGNOSIS — IMO0002 Reserved for concepts with insufficient information to code with codable children: Secondary | ICD-10-CM

## 2016-06-21 DIAGNOSIS — I129 Hypertensive chronic kidney disease with stage 1 through stage 4 chronic kidney disease, or unspecified chronic kidney disease: Secondary | ICD-10-CM

## 2016-06-21 DIAGNOSIS — E782 Mixed hyperlipidemia: Secondary | ICD-10-CM

## 2016-06-21 DIAGNOSIS — R229 Localized swelling, mass and lump, unspecified: Secondary | ICD-10-CM

## 2016-06-21 DIAGNOSIS — F419 Anxiety disorder, unspecified: Secondary | ICD-10-CM | POA: Diagnosis not present

## 2016-06-21 LAB — LIPID PANEL PICCOLO, WAIVED
CHOLESTEROL PICCOLO, WAIVED: 129 mg/dL (ref <200)
Chol/HDL Ratio Piccolo,Waive: 2.3 mg/dL
HDL CHOL PICCOLO, WAIVED: 56 mg/dL — AB (ref >59)
LDL Chol Calc Piccolo Waived: 46 mg/dL (ref <100)
TRIGLYCERIDES PICCOLO,WAIVED: 136 mg/dL (ref <150)
VLDL Chol Calc Piccolo,Waive: 27 mg/dL (ref <30)

## 2016-06-21 LAB — MICROALBUMIN, URINE WAIVED
Creatinine, Urine Waived: 50 mg/dL (ref 10–300)
Microalb, Ur Waived: 10 mg/L (ref 0–19)

## 2016-06-21 MED ORDER — AMLODIPINE BESYLATE 10 MG PO TABS: 10.0000 mg | ORAL_TABLET | Freq: Every day | ORAL | 1 refills | Status: DC

## 2016-06-21 MED ORDER — FLUTICASONE PROPIONATE 50 MCG/ACT NA SUSP: 2.0000 | Freq: Every day | NASAL | 12 refills | Status: DC

## 2016-06-21 MED ORDER — TRAMADOL HCL 50 MG PO TABS: 50.0000 mg | ORAL_TABLET | Freq: Three times a day (TID) | ORAL | 0 refills | Status: DC | PRN

## 2016-06-21 MED ORDER — ATORVASTATIN CALCIUM 40 MG PO TABS: 40.0000 mg | ORAL_TABLET | Freq: Every day | ORAL | 1 refills | Status: DC

## 2016-06-21 MED ORDER — BENAZEPRIL HCL 40 MG PO TABS: 40.0000 mg | ORAL_TABLET | Freq: Every day | ORAL | 1 refills | Status: DC

## 2016-06-21 MED ORDER — NAPROXEN 375 MG PO TABS: 375.0000 mg | ORAL_TABLET | Freq: Two times a day (BID) | ORAL | 0 refills | Status: DC

## 2016-06-21 MED ORDER — PANTOPRAZOLE SODIUM 40 MG PO TBEC: 40.0000 mg | DELAYED_RELEASE_TABLET | Freq: Every day | ORAL | 1 refills | Status: DC

## 2016-06-21 MED ORDER — CILIDINIUM-CHLORDIAZEPOXIDE 2.5-5 MG PO CAPS: ORAL_CAPSULE | ORAL | 1 refills | Status: DC

## 2016-06-21 MED ORDER — SERTRALINE HCL 50 MG PO TABS: 25.0000 mg | ORAL_TABLET | Freq: Every day | ORAL | 1 refills | Status: DC

## 2016-06-21 MED ORDER — HYDRALAZINE HCL 25 MG PO TABS: 25.0000 mg | ORAL_TABLET | Freq: Two times a day (BID) | ORAL | 1 refills | Status: DC

## 2016-06-21 MED ORDER — ATENOLOL 50 MG PO TABS: 50.0000 mg | ORAL_TABLET | Freq: Every day | ORAL | 1 refills | Status: DC

## 2016-06-21 MED ORDER — ASPIRIN 81 MG PO TABS: 81.0000 mg | ORAL_TABLET | Freq: Every day | ORAL | 3 refills | Status: DC

## 2016-06-21 MED ORDER — CLOBETASOL PROPIONATE 0.05 % EX OINT: TOPICAL_OINTMENT | CUTANEOUS | 1 refills | Status: DC

## 2016-06-21 MED ORDER — CLONAZEPAM 0.5 MG PO TABS: 0.5000 mg | ORAL_TABLET | Freq: Every evening | ORAL | 0 refills | Status: DC | PRN

## 2016-06-21 NOTE — Progress Notes (Signed)
BP 134/83 (BP Location: Left Arm, Cuff Size: Normal)   Pulse 66   Temp 98.6 F (37 C)   Wt 145 lb 9.6 oz (66 kg)   SpO2 99%   BMI 28.44 kg/m    Subjective:    Patient ID: Marie Parker, female    DOB: [birth year], 70 y.o.   MRN: [id number]  HPI: Marie Parker is a 70 y.o. female  Chief Complaint  Patient presents with  . Knee Pain   LUMP Duration: 1 week Location: anterior R knee Onset: sudden Painful: no Discomfort: no Status:  not changing Trauma: no Redness: no Bruising: no Recent infection: no Swollen lymph nodes: no Requesting removal: no History of cancer: no Family history of cancer: yes History of the same: yes- lipoma on her arm Associated signs and symptoms: none  HYPERTENSION / HYPERLIPIDEMIA Satisfied with current treatment? yes Duration of hypertension: chronic BP monitoring frequency: not checking BP medication side effects: no Duration of hyperlipidemia: chronic Cholesterol medication side effects: no Cholesterol supplements: none Medication compliance: excellent compliance Aspirin: yes Recent stressors: no Recurrent headaches: no Visual changes: no Palpitations: no Dyspnea: no Chest pain: no Lower extremity edema: no Dizzy/lightheaded: no  Relevant past medical, surgical, family and social history reviewed and updated as indicated. Interim medical history since our last visit reviewed. Allergies and medications reviewed and updated.  Review of Systems  Constitutional: Negative.   Respiratory: Negative.   Cardiovascular: Negative.   Musculoskeletal: Positive for arthralgias. Negative for back pain, gait problem, joint swelling, myalgias, neck pain and neck stiffness.  Psychiatric/Behavioral: Negative for agitation, behavioral problems, confusion, decreased concentration, dysphoric mood, hallucinations, self-injury, sleep disturbance and suicidal ideas. The patient is nervous/anxious. The patient is not hyperactive.     Per HPI  unless specifically indicated above     Objective:    BP 134/83 (BP Location: Left Arm, Cuff Size: Normal)   Pulse 66   Temp 98.6 F (37 C)   Wt 145 lb 9.6 oz (66 kg)   SpO2 99%   BMI 28.44 kg/m   Wt Readings from Last 3 Encounters:  06/21/16 145 lb 9.6 oz (66 kg)  04/18/16 147 lb (66.7 kg)  02/11/16 145 lb (65.8 kg)    Physical Exam  Constitutional: She is oriented to person, place, and time. She appears well-developed and well-nourished. No distress.  HENT:  Head: Normocephalic and atraumatic.  Right Ear: Hearing normal.  Left Ear: Hearing normal.  Nose: Nose normal.  Eyes: Conjunctivae and lids are normal. Right eye exhibits no discharge. Left eye exhibits no discharge. No scleral icterus.  Cardiovascular: Normal rate, regular rhythm, normal heart sounds and intact distal pulses.  Exam reveals no gallop and no friction rub.   No murmur heard. Pulmonary/Chest: Effort normal and breath sounds normal. No respiratory distress. She has no wheezes. She has no rales. She exhibits no tenderness.  Musculoskeletal: Normal range of motion.  Neurological: She is alert and oriented to person, place, and time.  Skin: Skin is warm, dry and intact. No rash noted. No erythema. No pallor.  2 in soft mobile lump on anterior superior R knee  Psychiatric: She has a normal mood and affect. Her speech is normal and behavior is normal. Judgment and thought content normal. Cognition and memory are normal.  Nursing note and vitals reviewed.   Results for orders placed or performed in visit on 02/11/16  HM MAMMOGRAPHY  Result Value Ref Range   HM Mammogram 0-4 Bi-Rad 0-4 Bi-Rad, Self Reported Normal  Cologuard  Result Value Ref Range   Cologuard Negative       Assessment & Plan:   Problem List Items Addressed This Visit      Digestive   GERD (gastroesophageal reflux disease)    Under good control. Continue current regimen. Continue to monitor.       Relevant Medications    clidinium-chlordiazePOXIDE (LIBRAX) 5-2.5 MG capsule   pantoprazole (PROTONIX) 40 MG tablet   Other Relevant Orders   CBC with Differential/Platelet   Comprehensive metabolic panel     Musculoskeletal and Integument   Lichen sclerosus    Will refill her clobetazol. Call with any concerns.         Genitourinary   Benign hypertensive renal disease - Primary    Better on recheck. Continue current regimen. Continue to monitor.       Relevant Orders   Comprehensive metabolic panel   Microalbumin, Urine Waived     Other   Hyperlipidemia    Under good control. Continue current regimen. Refills given today.      Relevant Medications   hydrALAZINE (APRESOLINE) 25 MG tablet   benazepril (LOTENSIN) 40 MG tablet   atorvastatin (LIPITOR) 40 MG tablet   atenolol (TENORMIN) 50 MG tablet   aspirin 81 MG tablet   amLODipine (NORVASC) 10 MG tablet   Other Relevant Orders   Comprehensive metabolic panel   Lipid Panel Piccolo, Waived   Gout    Will check uric acid. Continue to monitor.       Relevant Orders   Comprehensive metabolic panel   Uric acid   Acute anxiety    Stable. Having some issues with the anniversary of her sister's death. Continue current regimen.      Relevant Medications   sertraline (ZOLOFT) 50 MG tablet    Other Visit Diagnoses    Immunization due       Flu shot given today.   Relevant Orders   Flu vaccine HIGH DOSE PF (Fluzone High dose) (Completed)   Lump       Likely lipoma. Low dose naproxen x 2 weeks and heat, if no better will obtain ultrasound.       Follow up plan: Return in about 6 months (around 12/20/2016) for Wellness exam.

## 2016-06-21 NOTE — Assessment & Plan Note (Signed)
Under good control. Continue current regimen. Refills given today.

## 2016-06-21 NOTE — Assessment & Plan Note (Signed)
Under good control. Continue current regimen. Continue to monitor.  

## 2016-06-21 NOTE — Assessment & Plan Note (Signed)
Will check uric acid. Continue to monitor.

## 2016-06-21 NOTE — Assessment & Plan Note (Signed)
Better on recheck. Continue current regimen. Continue to monitor.  

## 2016-06-21 NOTE — Patient Instructions (Signed)

## 2016-06-21 NOTE — Assessment & Plan Note (Signed)
Will refill her clobetazol. Call with any concerns.

## 2016-06-21 NOTE — Assessment & Plan Note (Signed)
Stable. Having some issues with the anniversary of her sister's death. Continue current regimen.

## 2016-06-22 LAB — CBC WITH DIFFERENTIAL/PLATELET
BASOS ABS: 0 10*3/uL (ref 0.0–0.2)
Basos: 0 %
EOS (ABSOLUTE): 0.2 10*3/uL (ref 0.0–0.4)
Eos: 3 %
HEMOGLOBIN: 12.6 g/dL (ref 11.1–15.9)
Hematocrit: 38.6 % (ref 34.0–46.6)
Immature Grans (Abs): 0 10*3/uL (ref 0.0–0.1)
Immature Granulocytes: 0 %
LYMPHS ABS: 1.5 10*3/uL (ref 0.7–3.1)
Lymphs: 22 %
MCH: 29.3 pg (ref 26.6–33.0)
MCHC: 32.6 g/dL (ref 31.5–35.7)
MCV: 90 fL (ref 79–97)
MONOCYTES: 7 %
MONOS ABS: 0.5 10*3/uL (ref 0.1–0.9)
NEUTROS ABS: 4.7 10*3/uL (ref 1.4–7.0)
Neutrophils: 68 %
Platelets: 251 10*3/uL (ref 150–379)
RBC: 4.3 x10E6/uL (ref 3.77–5.28)
RDW: 14.4 % (ref 12.3–15.4)
WBC: 6.9 10*3/uL (ref 3.4–10.8)

## 2016-06-22 LAB — COMPREHENSIVE METABOLIC PANEL
ALBUMIN: 4.4 g/dL (ref 3.6–4.8)
ALK PHOS: 91 IU/L (ref 39–117)
ALT: 17 IU/L (ref 0–32)
AST: 23 IU/L (ref 0–40)
Albumin/Globulin Ratio: 1.8 (ref 1.2–2.2)
BILIRUBIN TOTAL: 0.3 mg/dL (ref 0.0–1.2)
BUN / CREAT RATIO: 13 (ref 12–28)
BUN: 11 mg/dL (ref 8–27)
CHLORIDE: 100 mmol/L (ref 96–106)
CO2: 23 mmol/L (ref 18–29)
Calcium: 9.4 mg/dL (ref 8.7–10.3)
Creatinine, Ser: 0.83 mg/dL (ref 0.57–1.00)
GFR calc Af Amer: 84 mL/min/{1.73_m2} (ref >59)
GFR calc non Af Amer: 73 mL/min/{1.73_m2} (ref >59)
GLOBULIN, TOTAL: 2.4 g/dL (ref 1.5–4.5)
GLUCOSE: 100 mg/dL — AB (ref 65–99)
Potassium: 4.4 mmol/L (ref 3.5–5.2)
Sodium: 140 mmol/L (ref 134–144)
Total Protein: 6.8 g/dL (ref 6.0–8.5)

## 2016-06-22 LAB — URIC ACID: URIC ACID: 6.9 mg/dL (ref 2.5–7.1)

## 2016-07-03 ENCOUNTER — Other Ambulatory Visit: Payer: Self-pay | Admitting: Family Medicine

## 2016-07-05 ENCOUNTER — Telehealth: Payer: Self-pay | Admitting: Family Medicine

## 2016-07-05 DIAGNOSIS — R229 Localized swelling, mass and lump, unspecified: Principal | ICD-10-CM

## 2016-07-05 DIAGNOSIS — IMO0002 Reserved for concepts with insufficient information to code with codable children: Secondary | ICD-10-CM

## 2016-07-05 NOTE — Telephone Encounter (Signed)
Called and spoke to Marie Parker no better. She is concerned. Ultrasound ordered.

## 2016-07-05 NOTE — Telephone Encounter (Signed)
-----   Message from Valerie Roys, DO sent at 06/21/2016  9:02 AM EDT ----- Call [name] to check on her lump, if not better, will get ultrasoun

## 2016-07-11 ENCOUNTER — Telehealth: Payer: Self-pay | Admitting: Family Medicine

## 2016-07-11 ENCOUNTER — Ambulatory Visit
Admission: RE | Admit: 2016-07-11 | Discharge: 2016-07-11 | Disposition: A | Discharge status: Home / Self Care | Payer: Medicare Other | Source: Ambulatory Visit | Attending: Family Medicine | Admitting: Family Medicine

## 2016-07-11 DIAGNOSIS — R2241 Localized swelling, mass and lump, right lower limb: Secondary | ICD-10-CM | POA: Diagnosis not present

## 2016-07-11 DIAGNOSIS — R229 Localized swelling, mass and lump, unspecified: Secondary | ICD-10-CM | POA: Insufficient documentation

## 2016-07-11 DIAGNOSIS — IMO0002 Reserved for concepts with insufficient information to code with codable children: Secondary | ICD-10-CM

## 2016-07-11 NOTE — Telephone Encounter (Signed)
Called patient to let her know that [place] was normal.

## 2016-07-12 ENCOUNTER — Ambulatory Visit: Payer: Medicare Other

## 2016-09-18 ENCOUNTER — Encounter: Payer: Self-pay | Admitting: Family Medicine

## 2016-09-18 ENCOUNTER — Ambulatory Visit (INDEPENDENT_AMBULATORY_CARE_PROVIDER_SITE_OTHER): Payer: Medicare Other | Admitting: Family Medicine

## 2016-09-18 VITALS — BP 138/74 | HR 64 | Temp 98.1°F | Wt 148.0 lb

## 2016-09-18 DIAGNOSIS — J0110 Acute frontal sinusitis, unspecified: Secondary | ICD-10-CM | POA: Diagnosis not present

## 2016-09-18 MED ORDER — AMOXICILLIN-POT CLAVULANATE 875-125 MG PO TABS: 1.0000 | ORAL_TABLET | Freq: Two times a day (BID) | ORAL | 0 refills | Status: DC

## 2016-09-18 MED ORDER — HYDROCOD POLST-CPM POLST ER 10-8 MG/5ML PO SUER: 5.0000 mL | Freq: Two times a day (BID) | ORAL | 0 refills | Status: DC | PRN

## 2016-09-18 MED ORDER — BENZONATATE 100 MG PO CAPS: 200.0000 mg | ORAL_CAPSULE | Freq: Three times a day (TID) | ORAL | 0 refills | Status: DC | PRN

## 2016-09-18 NOTE — Progress Notes (Signed)
   BP 138/74   Pulse 64   Temp 98.1 F (36.7 C)   Wt 148 lb (67.1 kg)   SpO2 99%   BMI 28.90 kg/m    Subjective:    Patient ID: Marie Parker, female    DOB: 1945/08/31, 71 y.o.   MRN: [id number]  HPI: Marie Parker is a 71 y.o. female  Chief Complaint  Patient presents with  . URI    x 3 weeks, head and chest congestion, cough, face pain, head ache, Ear pressure. No fever, no sore throat, no body aches.   Patient presents with 3 week history of congestion, productive cough, facial pain, dental pain, sinus HAs, and ear pain. Denies fever, chills, body aches, CP, SOB. Has been trying many OTC medications with no relief. Husband has been sick as well.  Former smoker, quit 16 years ago  Relevant past medical, surgical, family and social history reviewed and updated as indicated. Interim medical history since our last visit reviewed. Allergies and medications reviewed and updated.  Review of Systems  Constitutional: Negative.   HENT: Positive for congestion, ear pain, sinus pain and sinus pressure.   Eyes: Negative.   Respiratory: Positive for cough.   Cardiovascular: Negative.   Gastrointestinal: Negative.   Genitourinary: Negative.   Musculoskeletal: Negative.   Neurological: Positive for headaches.  Psychiatric/Behavioral: Negative.     Per HPI unless specifically indicated above     Objective:    BP 138/74   Pulse 64   Temp 98.1 F (36.7 C)   Wt 148 lb (67.1 kg)   SpO2 99%   BMI 28.90 kg/m   Wt Readings from Last 3 Encounters:  09/18/16 148 lb (67.1 kg)  06/21/16 145 lb 9.6 oz (66 kg)  04/18/16 147 lb (66.7 kg)    Physical Exam  Constitutional: She is oriented to person, place, and time. She appears well-developed and well-nourished. No distress.  HENT:  Head: Atraumatic.  Mild effusion b/l middle ears Frontal sinus TTP b/l Oropharynx erythematous  Eyes: Conjunctivae are normal. Pupils are equal, round, and reactive to light. No scleral icterus.    Neck: Normal range of motion. Neck supple.  Cardiovascular: Normal rate and normal heart sounds.   Pulmonary/Chest: Effort normal and breath sounds normal. No respiratory distress.  Musculoskeletal: Normal range of motion.  Neurological: She is alert and oriented to person, place, and time.  Skin: Skin is warm and dry.  Psychiatric: She has a normal mood and affect. Her behavior is normal.  Nursing note and vitals reviewed.     Assessment & Plan:   Problem List Items Addressed This Visit    None    Visit Diagnoses    Acute frontal sinusitis, recurrence not specified    -  Primary   Will treat with augmentin and tussionex. Precautions given, supportive care discussed. F/u if no improvement   Relevant Medications   amoxicillin-clavulanate (AUGMENTIN) 875-125 MG tablet   benzonatate (TESSALON) 100 MG capsule   chlorpheniramine-HYDROcodone (TUSSIONEX PENNKINETIC ER) 10-8 MG/5ML SUER       Follow up plan: Return if symptoms worsen or fail to improve.

## 2016-09-18 NOTE — Patient Instructions (Signed)
Follow up as needed

## 2016-09-26 DIAGNOSIS — H2513 Age-related nuclear cataract, bilateral: Secondary | ICD-10-CM | POA: Diagnosis not present

## 2016-10-09 ENCOUNTER — Ambulatory Visit (INDEPENDENT_AMBULATORY_CARE_PROVIDER_SITE_OTHER): Payer: Medicare Other | Admitting: Family Medicine

## 2016-10-09 ENCOUNTER — Encounter: Payer: Self-pay | Admitting: Family Medicine

## 2016-10-09 VITALS — BP 149/82 | HR 60 | Temp 98.5°F | Wt 148.0 lb

## 2016-10-09 DIAGNOSIS — R059 Cough, unspecified: Secondary | ICD-10-CM

## 2016-10-09 DIAGNOSIS — R05 Cough: Secondary | ICD-10-CM

## 2016-10-09 MED ORDER — PREDNISONE 20 MG PO TABS: 40.0000 mg | ORAL_TABLET | Freq: Every day | ORAL | 0 refills | Status: DC

## 2016-10-09 MED ORDER — HYDROCOD POLST-CPM POLST ER 10-8 MG/5ML PO SUER: 5.0000 mL | Freq: Two times a day (BID) | ORAL | 0 refills | Status: DC | PRN

## 2016-10-09 NOTE — Progress Notes (Signed)
   BP (!) 149/82   Pulse 60   Temp 98.5 F (36.9 C)   Wt 148 lb (67.1 kg)   SpO2 97%   BMI 28.90 kg/m    Subjective:    Patient ID: Marie Parker, female    DOB: 1946-02-23, 71 y.o.   MRN: [id number]  HPI: Marie Parker is a 71 y.o. female  Chief Complaint  Patient presents with  . Cough    still has a lingering cough, non productive. Chest congestion. Other symtoms have resolved.    Patient presents with persistent hacking cough lingering from otherwise resolved URI. Finished antibiotic, taking tussionex periodically. States tessalon perles aren't helping. Denies wheezing, SOB, fever, chills.   Relevant past medical, surgical, family and social history reviewed and updated as indicated. Interim medical history since our last visit reviewed. Allergies and medications reviewed and updated.  Review of Systems  Constitutional: Negative.   HENT: Negative.   Eyes: Negative.   Respiratory: Positive for cough.   Cardiovascular: Negative.   Gastrointestinal: Negative.   Genitourinary: Negative.   Musculoskeletal: Negative.   Skin: Negative.   Neurological: Negative.   Psychiatric/Behavioral: Negative.     Per HPI unless specifically indicated above     Objective:    BP (!) 149/82   Pulse 60   Temp 98.5 F (36.9 C)   Wt 148 lb (67.1 kg)   SpO2 97%   BMI 28.90 kg/m   Wt Readings from Last 3 Encounters:  10/09/16 148 lb (67.1 kg)  09/18/16 148 lb (67.1 kg)  06/21/16 145 lb 9.6 oz (66 kg)    Physical Exam  Constitutional: She is oriented to person, place, and time. She appears well-developed and well-nourished. No distress.  HENT:  Head: Atraumatic.  Right Ear: External ear normal.  Left Ear: External ear normal.  Nose: Nose normal.  Mouth/Throat: Oropharynx is clear and moist.  Eyes: Conjunctivae are normal. Pupils are equal, round, and reactive to light. No scleral icterus.  Neck: Normal range of motion. Neck supple.  Cardiovascular: Normal rate, regular  rhythm and normal heart sounds.   Pulmonary/Chest: Effort normal and breath sounds normal. No respiratory distress. She has no wheezes. She has no rales.  Musculoskeletal: Normal range of motion.  Lymphadenopathy:    She has no cervical adenopathy.  Neurological: She is alert and oriented to person, place, and time.  Skin: Skin is warm and dry.  Psychiatric: She has a normal mood and affect. Her behavior is normal.  Nursing note and vitals reviewed.     Assessment & Plan:   Problem List Items Addressed This Visit    None    Visit Diagnoses    Cough    -  Primary   Lungs clear, afebrile. Used shared decision making - will treat with prednisone and refill tussionex. Plain mucinex, stay well hydrated. F/u if no improvement       Follow up plan: Return if symptoms worsen or fail to improve.

## 2016-10-09 NOTE — Patient Instructions (Addendum)
Plain mucinex, delsym

## 2016-11-09 ENCOUNTER — Telehealth: Payer: Self-pay | Admitting: Family Medicine

## 2016-11-09 NOTE — Telephone Encounter (Signed)
Patient is still having lots of nasal congestion and cough for 4 months.

## 2016-12-05 DIAGNOSIS — Z859 Personal history of malignant neoplasm, unspecified: Secondary | ICD-10-CM | POA: Diagnosis not present

## 2016-12-05 DIAGNOSIS — L400 Psoriasis vulgaris: Secondary | ICD-10-CM | POA: Diagnosis not present

## 2016-12-05 DIAGNOSIS — Z872 Personal history of diseases of the skin and subcutaneous tissue: Secondary | ICD-10-CM | POA: Diagnosis not present

## 2016-12-06 ENCOUNTER — Emergency Department: Payer: Medicare Other

## 2016-12-06 ENCOUNTER — Encounter: Payer: Self-pay | Admitting: Emergency Medicine

## 2016-12-06 ENCOUNTER — Emergency Department
Admission: EM | Admit: 2016-12-06 | Discharge: 2016-12-07 | Disposition: A | Discharge status: Home / Self Care | Payer: Medicare Other | Attending: Emergency Medicine | Admitting: Emergency Medicine

## 2016-12-06 DIAGNOSIS — Z87891 Personal history of nicotine dependence: Secondary | ICD-10-CM | POA: Insufficient documentation

## 2016-12-06 DIAGNOSIS — R079 Chest pain, unspecified: Secondary | ICD-10-CM | POA: Diagnosis not present

## 2016-12-06 DIAGNOSIS — I447 Left bundle-branch block, unspecified: Secondary | ICD-10-CM | POA: Diagnosis not present

## 2016-12-06 DIAGNOSIS — N189 Chronic kidney disease, unspecified: Secondary | ICD-10-CM | POA: Insufficient documentation

## 2016-12-06 DIAGNOSIS — E876 Hypokalemia: Secondary | ICD-10-CM

## 2016-12-06 DIAGNOSIS — R0789 Other chest pain: Secondary | ICD-10-CM | POA: Diagnosis not present

## 2016-12-06 DIAGNOSIS — Z79899 Other long term (current) drug therapy: Secondary | ICD-10-CM | POA: Diagnosis not present

## 2016-12-06 DIAGNOSIS — I2510 Atherosclerotic heart disease of native coronary artery without angina pectoris: Secondary | ICD-10-CM | POA: Diagnosis not present

## 2016-12-06 DIAGNOSIS — I129 Hypertensive chronic kidney disease with stage 1 through stage 4 chronic kidney disease, or unspecified chronic kidney disease: Secondary | ICD-10-CM | POA: Insufficient documentation

## 2016-12-06 LAB — CBC
HEMATOCRIT: 37.9 % (ref 35.0–47.0)
Hemoglobin: 12.6 g/dL (ref 12.0–16.0)
MCH: 29.6 pg (ref 26.0–34.0)
MCHC: 33.2 g/dL (ref 32.0–36.0)
MCV: 89.3 fL (ref 80.0–100.0)
Platelets: 213 10*3/uL (ref 150–440)
RBC: 4.24 MIL/uL (ref 3.80–5.20)
RDW: 15 % — AB (ref 11.5–14.5)
WBC: 6.1 10*3/uL (ref 3.6–11.0)

## 2016-12-06 LAB — TROPONIN I

## 2016-12-06 LAB — BASIC METABOLIC PANEL
ANION GAP: 9 (ref 5–15)
BUN: 16 mg/dL (ref 6–20)
CALCIUM: 8.8 mg/dL — AB (ref 8.9–10.3)
CO2: 26 mmol/L (ref 22–32)
Chloride: 107 mmol/L (ref 101–111)
Creatinine, Ser: 0.91 mg/dL (ref 0.44–1.00)
GFR calc Af Amer: >60 mL/min (ref >60)
Glucose, Bld: 123 mg/dL — ABNORMAL HIGH (ref 65–99)
POTASSIUM: 2.9 mmol/L — AB (ref 3.5–5.1)
SODIUM: 142 mmol/L (ref 135–145)

## 2016-12-06 NOTE — ED Triage Notes (Signed)
Pt ambulatory to triage with steady gait, no ditress noted. Pt c/o central chest pain, described as a "heavy feeling." Pt denies SOB, N/V.

## 2016-12-07 ENCOUNTER — Telehealth: Payer: Self-pay | Admitting: Family Medicine

## 2016-12-07 MED ORDER — POTASSIUM CHLORIDE CRYS ER 20 MEQ PO TBCR
40.0000 meq | EXTENDED_RELEASE_TABLET | Freq: Once | ORAL | Status: AC
  Administered 2016-12-07: 40 meq via ORAL
  Filled 2016-12-07: qty 2

## 2016-12-07 MED ORDER — GI COCKTAIL ~~LOC~~
30.0000 mL | Freq: Once | ORAL | Status: AC
  Administered 2016-12-07: 30 mL via ORAL
  Filled 2016-12-07: qty 30

## 2016-12-07 MED ORDER — POTASSIUM CHLORIDE CRYS ER 20 MEQ PO TBCR: 20.0000 meq | EXTENDED_RELEASE_TABLET | Freq: Every day | ORAL | 0 refills | Status: DC

## 2016-12-07 NOTE — ED Provider Notes (Signed)
[facility] Emergency Department Provider Note  ____________________________________________   First MD Initiated Contact with Patient 12/07/16 0003     (approximate)  I have reviewed the triage vital signs and the nursing notes.   HISTORY  Chief Complaint Chest Pain    HPI Marie Parker is a 71 y.o. female whose medical history does include coronary artery disease status post stent and known left bundle-branch block who presents for evaluation of chest pain.  She says it is located at the very bottom of her sternum and feels like a tingling, sometimes cold and sometimes hot sensation.  It occasionally feels heavy.  She denies shortness of breath, nausea, vomiting, any other abdominal pain, recent fever/chills.  She has had no sharp pain.  She has no numbness or tingling in any of her extremities and no generalized weakness.  She does state that she has significant acid reflux issues and she has had a number of dietary indiscretions recently so she wonders if this could be contributing.  The pain is nonradiating.  It started yesterday but it was very mild but when he continued today and her blood pressure continued to elevate, she decided to come to the emergency department for evaluation.   Past Medical History:  Diagnosis Date  . Anxiety   . Arthritis   . CAD (coronary artery disease)   . Cancer (HCC)    squamous cell  . Chronic kidney disease   . Depression   . Generalized osteoarthritis   . GERD (gastroesophageal reflux disease)   . Gout   . History of endoscopy 11/2012   Gastritis  . Hyperlipidemia   . Hypertension   . IFG (impaired fasting glucose)   . Insomnia   . Menopause   . Osteoporosis   . Parathyroid adenoma   . Personal history of tobacco use, presenting hazards to health 02/02/2016  . PVD (peripheral vascular disease) ([place])   . PVD (peripheral vascular disease) ([place])   . Rotator cuff syndrome of shoulder and allied disorders   .  Tobacco abuse     Patient Active Problem List   Diagnosis Date Noted  . Acute anxiety 06/21/2016  . Personal history of tobacco use, presenting hazards to health 02/02/2016  . Multiple pulmonary nodules determined by computed tomography of lung 12/21/2015  . Benign hypertensive renal disease 06/22/2015  . Hyperlipidemia 06/22/2015  . Gout 06/22/2015  . Lichen sclerosus 31/51/7616  . GERD (gastroesophageal reflux disease) 01/22/2013  . Peripheral vascular disease ([place]) 01/22/2013  . CAD (coronary artery disease) 01/22/2013    Past Surgical History:  Procedure Laterality Date  . BREAST BIOPSY Right 2014   neg  . BREAST BIOPSY Left 1970's   neg  . BREAST FIBROADENOMA SURGERY    . CARDIAC CATHETERIZATION  09/17/2007   Insignificant CAD with 40-50% stenosis of mid LAD.   [name] ILIAC ARTERY STENT     right iliac artery stent   . ORIF PATELLA Left 03/22/2016   Dr. Tristan Schroeder  . PARATHYROIDECTOMY    . SKIN BIOPSY     precancerous    Prior to Admission medications   Medication Sig Start Date End Date Taking? Authorizing Provider  amLODipine (NORVASC) 10 MG tablet Take 1 tablet (10 mg total) by mouth daily. 06/21/16  Yes Megan P Johnson, DO  aspirin 81 MG tablet Take 1 tablet (81 mg total) by mouth daily. 06/21/16  Yes Megan P Johnson, DO  atenolol (TENORMIN) 50 MG tablet Take 1 tablet (50 mg total)  by mouth daily. 06/21/16  Yes Megan P Johnson, DO  atorvastatin (LIPITOR) 40 MG tablet Take 1 tablet (40 mg total) by mouth daily. 06/21/16  Yes Megan P Johnson, DO  benazepril (LOTENSIN) 40 MG tablet Take 1 tablet (40 mg total) by mouth daily. 06/21/16  Yes Megan P Johnson, DO  clidinium-chlordiazePOXIDE (LIBRAX) 5-2.5 MG capsule TAKE 1 CAPSULE BY MOUTH TWICE DAILY FOR LUNCH/SUPPER. Patient taking differently: Take 1 capsule by mouth daily. TAKE 1 CAPSULE BY MOUTH TWICE DAILY FOR LUNCH/SUPPER. 06/21/16  Yes Megan P Johnson, DO  fluticasone (FLONASE) 50 MCG/ACT nasal spray Place 2 sprays into  both nostrils daily. 06/21/16  Yes Megan P Johnson, DO  hydrALAZINE (APRESOLINE) 25 MG tablet Take 1 tablet (25 mg total) by mouth 2 (two) times daily. 06/21/16  Yes Megan P Johnson, DO  NIACIN PO Take 1,000 mg by mouth daily.   Yes Historical Provider, MD  pantoprazole (PROTONIX) 40 MG tablet Take 1 tablet (40 mg total) by mouth daily. 06/21/16  Yes Megan P Johnson, DO  sertraline (ZOLOFT) 50 MG tablet Take 0.5 tablets (25 mg total) by mouth daily. 06/21/16  Yes Megan P Johnson, DO  acetaminophen (TYLENOL) 500 MG tablet Take 1,000 mg by mouth. 03/23/16   Historical Provider, MD  clonazePAM (KLONOPIN) 0.5 MG tablet Take 1 tablet (0.5 mg total) by mouth at bedtime as needed. 06/21/16   Megan P Johnson, DO  potassium chloride SA (KLOR-CON M20) 20 MEQ tablet Take 1 tablet (20 mEq total) by mouth daily. 12/07/16   Hinda Kehr, MD  traMADol (ULTRAM) 50 MG tablet Take 1 tablet (50 mg total) by mouth every 8 (eight) hours as needed. 06/21/16   Megan P Johnson, DO    Allergies Crestor [rosuvastatin]; Cyclobenzaprine; Erythromycin; Fosamax [alendronate sodium]; and Neosporin [neomycin-bacitracin zn-polymyx]  Family History  Problem Relation Age of Onset  . Heart attack Father 29  . Alcohol abuse Father   . Cancer Father   . Heart disease Father   . Liver disease Father   . Hypertension Father   . Heart disease Sister 49    CAD; s/p stent placement  . Stroke Sister   . AAA (abdominal aortic aneurysm) Sister   . Dementia Sister   . Stroke Mother   . Hypertension Mother   . Diabetes Mother   . Stroke Maternal Grandmother   . Heart attack Maternal Grandfather   . Stroke Paternal Grandmother   . Breast cancer Maternal Aunt     Social History Social History  Substance Use Topics  . Smoking status: Former Smoker    Packs/day: 1.00    Years: 42.00    Types: Cigarettes    Quit date: 10/09/2001  . Smokeless tobacco: Never Used  . Alcohol use No    Review of Systems Constitutional: No  fever/chills Eyes: No visual changes. ENT: No sore throat. Cardiovascular: Low sternal chest pain. Respiratory: Denies shortness of breath. Gastrointestinal: No abdominal pain.  No nausea, no vomiting.  No diarrhea.  No constipation. Genitourinary: Negative for dysuria. Musculoskeletal: Negative for back pain. Skin: Negative for rash. Neurological: Negative for headaches, focal weakness or numbness.  10-point ROS otherwise negative.  ____________________________________________   PHYSICAL EXAM:  VITAL SIGNS: ED Triage Vitals  Enc Vitals Group     BP 12/06/16 1957 (!) 159/83     Pulse Rate 12/06/16 1957 72     Resp 12/06/16 1957 16     Temp --      Temp src --  SpO2 12/06/16 1957 99 %     Weight 12/06/16 1955 146 lb (66.2 kg)     Height 12/06/16 1955 4\' 11"  (1.499 m)     Head Circumference --      Peak Flow --      Pain Score 12/06/16 2349 2     Pain Loc --      Pain Edu? --      Excl. in [place]? --     Constitutional: Alert and oriented. Well appearing and in no acute distress. Eyes: Conjunctivae are normal. PERRL. EOMI. Head: Atraumatic. Nose: No congestion/rhinnorhea. Mouth/Throat: Mucous membranes are moist. Neck: No stridor.  No meningeal signs.   Cardiovascular: Normal rate, regular rhythm. Good peripheral circulation. Grossly normal heart sounds. Mild Tenderness to palpation of the bottom of the sternum and xiphoid process Respiratory: Normal respiratory effort.  No retractions. Lungs CTAB. Gastrointestinal: Soft and nontender. No distention.  Musculoskeletal: No lower extremity tenderness nor edema. No gross deformities of extremities. Neurologic:  Normal speech and language. No gross focal neurologic deficits are appreciated.  Skin:  Skin is warm, dry and intact. No rash noted. Psychiatric: Mood and affect are normal. Speech and behavior are normal.  ____________________________________________   LABS (all labs ordered are listed, but only abnormal  results are displayed)  Labs Reviewed  BASIC METABOLIC PANEL - Abnormal; Notable for the following:       Result Value   Potassium 2.9 (*)    Glucose, Bld 123 (*)    Calcium 8.8 (*)    All other components within normal limits  CBC - Abnormal; Notable for the following:    RDW 15.0 (*)    All other components within normal limits  TROPONIN I   ____________________________________________  EKG  ED ECG REPORT I, Jaquala Fuller, the attending physician, personally viewed and interpreted this ECG.  Date: 12/06/2016 EKG Time: 20:00 Rate: 67 Rhythm: normal sinus rhythm QRS Axis: normal Intervals: Left bundle branch block ST/T Wave abnormalities: normal Conduction Disturbances: none Narrative Interpretation: unremarkable - LBBB was present in 2014  ____________________________________________  RADIOLOGY   Dg Chest 2 View  Result Date: 12/06/2016 CLINICAL DATA:  Chest pain beginning today. Coronary artery disease. EXAM: CHEST  2 VIEW COMPARISON:  01/20/2013 FINDINGS: The heart size and mediastinal contours are within normal limits. Aortic atherosclerosis. Both lungs are clear. The visualized skeletal structures are unremarkable. IMPRESSION: Stable exam.  No active cardiopulmonary disease. Electronically Signed   By: Earle Gell M.D.   On: 12/06/2016 20:16    ____________________________________________   PROCEDURES  Critical Care performed: No   Procedure(s) performed:   Procedures   ____________________________________________   INITIAL IMPRESSION / ASSESSMENT AND PLAN / ED COURSE  Pertinent labs & imaging results that were available during my care of the patient were reviewed by me and considered in my medical decision making (see chart for details).  The patient does have a history of coronary artery disease and multiple cardiac risk factors.  However her chest pain is very atypical, her EKG is unchanged, and her troponin is negative.  She states that in the  past she has been given a GI cocktail and would like to try one of those today.  I explained to her about "cardiac rule out" and how we frequently will check a second troponin although I do not feel it is absolutely necessary for her given that she has had symptoms since yesterday.  She does not want to stay for the second one and states  that she is feeling better.  She wants to follow up with Dr. Fletcher Anon to whom her husband goes for cardiology care.  I will provide that information for close follow-up and I gave my usual and customary return precautions.  She has no signs or symptoms of pulmonary embolism or aortic dissection and there is no evidence of acute or emergent medical condition at this time.  She does have a slightly low potassium for which I gave her a supplement of 40 mEq in the emergency department and a prescription for a week's worth as an outpatient.  She understands and agrees with the plan      ____________________________________________  FINAL CLINICAL IMPRESSION(S) / ED DIAGNOSES  Final diagnoses:  Atypical chest pain  Hypokalemia     MEDICATIONS GIVEN DURING THIS VISIT:  Medications  gi cocktail (Maalox,Lidocaine,Donnatal) (30 mLs Oral Given 12/07/16 0036)  potassium chloride SA (K-DUR,KLOR-CON) CR tablet 40 mEq (40 mEq Oral Given 12/07/16 0036)     NEW OUTPATIENT MEDICATIONS STARTED DURING THIS VISIT:  Discharge Medication List as of 12/07/2016 12:52 AM    START taking these medications   Details  potassium chloride SA (KLOR-CON M20) 20 MEQ tablet Take 1 tablet (20 mEq total) by mouth daily., Starting Thu 12/07/2016, Print        Discharge Medication List as of 12/07/2016 12:52 AM      Discharge Medication List as of 12/07/2016 12:52 AM    STOP taking these medications     chlorpheniramine-HYDROcodone (TUSSIONEX PENNKINETIC ER) 10-8 MG/5ML SUER Comments:  Reason for Stopping:           Note:  This document was prepared using Dragon voice recognition  software and may include unintentional dictation errors.    Hinda Kehr, MD 12/07/16 [phone]

## 2016-12-07 NOTE — Discharge Instructions (Signed)

## 2016-12-07 NOTE — ED Notes (Signed)
ED Provider at bedside. 

## 2016-12-07 NOTE — Telephone Encounter (Signed)
Patient would like for Tiffany to give her a call back.  She has some questions regarding her upcoming appt for her CPE as she had to go to the ER last night and she is unsure if she needs an appt before her CPE to see Dr Wynetta Emery.    Thanks

## 2016-12-08 NOTE — Telephone Encounter (Signed)
Patient went to the ER for discomfort in her chest with BP of 180/86, EKG, Blood and x-ray was fine, low potassium, 7 pills given, unsure if she needs to continue or not  Appt with Dr.Arida with 12/28/16.  Do you want her to come in for a hospital follow up or can she wait until her CPE at the end of the month

## 2016-12-08 NOTE — Telephone Encounter (Signed)
Can we get her in for a hospital follow up

## 2016-12-11 NOTE — Telephone Encounter (Signed)
Patient scheduled.

## 2016-12-12 ENCOUNTER — Ambulatory Visit (INDEPENDENT_AMBULATORY_CARE_PROVIDER_SITE_OTHER): Payer: Medicare Other | Admitting: Family Medicine

## 2016-12-12 ENCOUNTER — Encounter: Payer: Self-pay | Admitting: Family Medicine

## 2016-12-12 ENCOUNTER — Other Ambulatory Visit: Payer: Self-pay | Admitting: Family Medicine

## 2016-12-12 VITALS — BP 130/79 | HR 66 | Temp 98.6°F | Wt 145.8 lb

## 2016-12-12 DIAGNOSIS — E876 Hypokalemia: Secondary | ICD-10-CM | POA: Diagnosis not present

## 2016-12-12 DIAGNOSIS — R079 Chest pain, unspecified: Secondary | ICD-10-CM

## 2016-12-12 DIAGNOSIS — K219 Gastro-esophageal reflux disease without esophagitis: Secondary | ICD-10-CM

## 2016-12-12 MED ORDER — SUCRALFATE 1 G PO TABS: 1.0000 g | ORAL_TABLET | Freq: Three times a day (TID) | ORAL | 2 refills | Status: DC

## 2016-12-12 NOTE — Progress Notes (Signed)
BP 130/79 (BP Location: Left Arm, Patient Position: Sitting, Cuff Size: Normal)   Pulse 66   Temp 98.6 F (37 C)   Wt 145 lb 12.8 oz (66.1 kg)   SpO2 98%   BMI 29.45 kg/m    Subjective:    Patient ID: Marie Parker, female    DOB: 26-Mar-1946, 71 y.o.   MRN: [id number]  HPI: Marie Parker is a 71 y.o. female  Chief Complaint  Patient presents with  . Hospitalization Follow-up   ER FOLLOW UP Time since discharge: 5 days Hospital/facility: [facility] Diagnosis: Chest pain Procedures/tests: EKG (unchanged), Troponin x1 (negative) Consultants: None New medications:  GI cocktail,  potassium 62mEq x1 week Discharge instructions:  Follow up with cardiology Status: better   Had eaten some food with tomato sauce before she went- thinks that it was acid reflux Going to see Dr. Fletcher Anon 12/28/16  Relevant past medical, surgical, family and social history reviewed and updated as indicated. Interim medical history since our last visit reviewed. Allergies and medications reviewed and updated.  Review of Systems  Constitutional: Negative.   Respiratory: Negative.   Cardiovascular: Negative.   Psychiatric/Behavioral: Negative.    Per HPI unless specifically indicated above     Objective:    BP 130/79 (BP Location: Left Arm, Patient Position: Sitting, Cuff Size: Normal)   Pulse 66   Temp 98.6 F (37 C)   Wt 145 lb 12.8 oz (66.1 kg)   SpO2 98%   BMI 29.45 kg/m   Wt Readings from Last 3 Encounters:  12/12/16 145 lb 12.8 oz (66.1 kg)  12/06/16 146 lb (66.2 kg)  10/09/16 148 lb (67.1 kg)    Physical Exam  Constitutional: She is oriented to person, place, and time. She appears well-developed and well-nourished. No distress.  HENT:  Head: Normocephalic and atraumatic.  Right Ear: Hearing normal.  Left Ear: Hearing normal.  Nose: Nose normal.  Eyes: Conjunctivae and lids are normal. Right eye exhibits no discharge. Left eye exhibits no discharge. No scleral icterus.    Cardiovascular: Normal rate, regular rhythm, normal heart sounds and intact distal pulses.  Exam reveals no gallop and no friction rub.   No murmur heard. Pulmonary/Chest: Effort normal and breath sounds normal. No respiratory distress. She has no wheezes. She has no rales. She exhibits no tenderness.  Musculoskeletal: Normal range of motion.  Neurological: She is alert and oriented to person, place, and time.  Skin: Skin is warm, dry and intact. No rash noted. She is not diaphoretic. No erythema. No pallor.  Psychiatric: She has a normal mood and affect. Her speech is normal and behavior is normal. Judgment and thought content normal. Cognition and memory are normal.  Nursing note and vitals reviewed.   Results for orders placed or performed during the hospital encounter of 24/40/10  Basic metabolic panel  Result Value Ref Range   Sodium 142 135 - 145 mmol/L   Potassium 2.9 (L) 3.5 - 5.1 mmol/L   Chloride 107 101 - 111 mmol/L   CO2 26 22 - 32 mmol/L   Glucose, Bld 123 (H) 65 - 99 mg/dL   BUN 16 6 - 20 mg/dL   Creatinine, Ser 0.91 0.44 - 1.00 mg/dL   Calcium 8.8 (L) 8.9 - 10.3 mg/dL   GFR calc non Af Amer >60 >60 mL/min   GFR calc Af Amer >60 >60 mL/min   Anion gap 9 5 - 15  CBC  Result Value Ref Range   WBC 6.1 3.6 -  11.0 K/uL   RBC 4.24 3.80 - 5.20 MIL/uL   Hemoglobin 12.6 12.0 - 16.0 g/dL   HCT 37.9 35.0 - 47.0 %   MCV 89.3 80.0 - 100.0 fL   MCH 29.6 26.0 - 34.0 pg   MCHC 33.2 32.0 - 36.0 g/dL   RDW 15.0 (H) 11.5 - 14.5 %   Platelets 213 150 - 440 K/uL  Troponin I  Result Value Ref Range   Troponin I <0.03 <0.03 ng/mL      Assessment & Plan:   Problem List Items Addressed This Visit      Digestive   GERD (gastroesophageal reflux disease)    Will start her on carafate. Call with any concerns.       Relevant Medications   sucralfate (CARAFATE) 1 g tablet    Other Visit Diagnoses    Chest pain, unspecified type    -  Primary   To be following up with Dr. Fletcher Anon.  No pain now. Call with any concerns.    Hypokalemia       Rechecking potassium today.   Relevant Orders   Basic metabolic panel       Follow up plan: Return As scheduled.

## 2016-12-12 NOTE — Assessment & Plan Note (Signed)
Will start her on carafate. Call with any concerns.

## 2016-12-13 ENCOUNTER — Telehealth: Payer: Self-pay | Admitting: Family Medicine

## 2016-12-13 LAB — BASIC METABOLIC PANEL
BUN/Creatinine Ratio: 12 (ref 12–28)
BUN: 11 mg/dL (ref 8–27)
CALCIUM: 9.6 mg/dL (ref 8.7–10.3)
CHLORIDE: 101 mmol/L (ref 96–106)
CO2: 24 mmol/L (ref 18–29)
Creatinine, Ser: 0.95 mg/dL (ref 0.57–1.00)
GFR, EST AFRICAN AMERICAN: 71 mL/min/{1.73_m2} (ref >59)
GFR, EST NON AFRICAN AMERICAN: 61 mL/min/{1.73_m2} (ref >59)
Glucose: 114 mg/dL — ABNORMAL HIGH (ref 65–99)
Potassium: 5.5 mmol/L — ABNORMAL HIGH (ref 3.5–5.2)
Sodium: 142 mmol/L (ref 134–144)

## 2016-12-13 NOTE — Telephone Encounter (Signed)
Called patient. Potassium too high. Stop supplement.

## 2016-12-21 ENCOUNTER — Ambulatory Visit (INDEPENDENT_AMBULATORY_CARE_PROVIDER_SITE_OTHER): Payer: Medicare Other | Admitting: Family Medicine

## 2016-12-21 ENCOUNTER — Encounter: Payer: Self-pay | Admitting: Family Medicine

## 2016-12-21 VITALS — BP 128/82 | HR 60 | Temp 98.5°F | Ht 59.3 in | Wt 146.2 lb

## 2016-12-21 DIAGNOSIS — I739 Peripheral vascular disease, unspecified: Secondary | ICD-10-CM | POA: Diagnosis not present

## 2016-12-21 DIAGNOSIS — Z87891 Personal history of nicotine dependence: Secondary | ICD-10-CM

## 2016-12-21 DIAGNOSIS — I129 Hypertensive chronic kidney disease with stage 1 through stage 4 chronic kidney disease, or unspecified chronic kidney disease: Secondary | ICD-10-CM | POA: Diagnosis not present

## 2016-12-21 DIAGNOSIS — F419 Anxiety disorder, unspecified: Secondary | ICD-10-CM | POA: Diagnosis not present

## 2016-12-21 DIAGNOSIS — Z0000 Encounter for general adult medical examination without abnormal findings: Secondary | ICD-10-CM | POA: Diagnosis not present

## 2016-12-21 DIAGNOSIS — I2510 Atherosclerotic heart disease of native coronary artery without angina pectoris: Secondary | ICD-10-CM | POA: Diagnosis not present

## 2016-12-21 DIAGNOSIS — Z1239 Encounter for other screening for malignant neoplasm of breast: Secondary | ICD-10-CM

## 2016-12-21 DIAGNOSIS — K219 Gastro-esophageal reflux disease without esophagitis: Secondary | ICD-10-CM

## 2016-12-21 DIAGNOSIS — M7061 Trochanteric bursitis, right hip: Secondary | ICD-10-CM | POA: Diagnosis not present

## 2016-12-21 DIAGNOSIS — E782 Mixed hyperlipidemia: Secondary | ICD-10-CM | POA: Diagnosis not present

## 2016-12-21 DIAGNOSIS — Z1231 Encounter for screening mammogram for malignant neoplasm of breast: Secondary | ICD-10-CM

## 2016-12-21 DIAGNOSIS — R918 Other nonspecific abnormal finding of lung field: Secondary | ICD-10-CM | POA: Diagnosis not present

## 2016-12-21 DIAGNOSIS — M1A9XX0 Chronic gout, unspecified, without tophus (tophi): Secondary | ICD-10-CM | POA: Diagnosis not present

## 2016-12-21 LAB — UA/M W/RFLX CULTURE, ROUTINE
BILIRUBIN UA: NEGATIVE
Glucose, UA: NEGATIVE
Ketones, UA: NEGATIVE
Leukocytes, UA: NEGATIVE
NITRITE UA: NEGATIVE
PH UA: 5.5 (ref 5.0–7.5)
RBC UA: NEGATIVE
Specific Gravity, UA: 1.015 (ref 1.005–1.030)
UUROB: 0.2 mg/dL (ref 0.2–1.0)

## 2016-12-21 LAB — MICROALBUMIN, URINE WAIVED
Creatinine, Urine Waived: 300 mg/dL (ref 10–300)
Microalb, Ur Waived: 80 mg/L — ABNORMAL HIGH (ref 0–19)

## 2016-12-21 LAB — MICROSCOPIC EXAMINATION
BACTERIA UA: NONE SEEN
RBC MICROSCOPIC, UA: NONE SEEN /HPF (ref 0–?)

## 2016-12-21 MED ORDER — PANTOPRAZOLE SODIUM 40 MG PO TBEC: 40.0000 mg | DELAYED_RELEASE_TABLET | Freq: Every day | ORAL | 1 refills | Status: DC

## 2016-12-21 MED ORDER — CILIDINIUM-CHLORDIAZEPOXIDE 2.5-5 MG PO CAPS: ORAL_CAPSULE | ORAL | 1 refills | Status: DC

## 2016-12-21 MED ORDER — SERTRALINE HCL 50 MG PO TABS: 25.0000 mg | ORAL_TABLET | Freq: Every day | ORAL | 1 refills | Status: DC

## 2016-12-21 MED ORDER — FLUTICASONE PROPIONATE 50 MCG/ACT NA SUSP: 2.0000 | Freq: Every day | NASAL | 12 refills | Status: DC

## 2016-12-21 MED ORDER — HYDRALAZINE HCL 25 MG PO TABS
25.0000 mg | ORAL_TABLET | Freq: Two times a day (BID) | ORAL | 1 refills | Status: DC
Start: 2016-12-21 — End: 2017-06-26

## 2016-12-21 MED ORDER — CLONAZEPAM 0.5 MG PO TABS: 0.5000 mg | ORAL_TABLET | Freq: Every evening | ORAL | 0 refills | Status: DC | PRN

## 2016-12-21 MED ORDER — BENAZEPRIL HCL 40 MG PO TABS: 40.0000 mg | ORAL_TABLET | Freq: Every day | ORAL | 1 refills | Status: DC

## 2016-12-21 MED ORDER — ATORVASTATIN CALCIUM 40 MG PO TABS: 40.0000 mg | ORAL_TABLET | Freq: Every day | ORAL | 1 refills | Status: DC

## 2016-12-21 MED ORDER — ATENOLOL 50 MG PO TABS: 50.0000 mg | ORAL_TABLET | Freq: Every day | ORAL | 1 refills | Status: DC

## 2016-12-21 MED ORDER — ASPIRIN 81 MG PO TABS: 81.0000 mg | ORAL_TABLET | Freq: Every day | ORAL | 3 refills | Status: DC

## 2016-12-21 MED ORDER — AMLODIPINE BESYLATE 10 MG PO TABS: 10.0000 mg | ORAL_TABLET | Freq: Every day | ORAL | 1 refills | Status: DC

## 2016-12-21 NOTE — Patient Instructions (Addendum)
Preventative Services:  Health Risk Assessment and Personalized Prevention Plan: Done today Bone Mass Measurements:  Up to date Breast Cancer Screening: Ordered today CVD Screening: Done today Cervical Cancer Screening: N/A Colon Cancer Screening: Up to date Depression Screening: Done today Diabetes Screening: Done today Glaucoma Screening: See your eye doctor Hepatitis B vaccine: N/A Hepatitis C screening: Up to date HIV Screening: Up to date Flu Vaccine: Get in October Lung cancer Screening: Up to date Obesity Screening: Done today Pneumonia Vaccines (2): up to date STI Screening: N/AHealth Maintenance, Female Adopting a healthy lifestyle and getting preventive care can go a long way to promote health and wellness. Talk with your health care provider about what schedule of regular examinations is right for you. This is a good chance for you to check in with your provider about disease prevention and staying healthy. In between checkups, there are plenty of things you can do on your own. Experts have done a lot of research about which lifestyle changes and preventive measures are most likely to keep you healthy. Ask your health care provider for more information. Weight and diet Eat a healthy diet  Be sure to include plenty of vegetables, fruits, low-fat dairy products, and lean protein.  Do not eat a lot of foods high in solid fats, added sugars, or salt.  Get regular exercise. This is one of the most important things you can do for your health.  Most adults should exercise for at least 150 minutes each week. The exercise should increase your heart rate and make you sweat (moderate-intensity exercise).  Most adults should also do strengthening exercises at least twice a week. This is in addition to the moderate-intensity exercise. Maintain a healthy weight  Body mass index (BMI) is a measurement that can be used to identify possible weight problems. It estimates body fat based on  height and weight. Your health care provider can help determine your BMI and help you achieve or maintain a healthy weight.  For females 109 years of age and older:  A BMI below 18.5 is considered underweight.  A BMI of 18.5 to 24.9 is normal.  A BMI of 25 to 29.9 is considered overweight.  A BMI of 30 and above is considered obese. Watch levels of cholesterol and blood lipids  You should start having your blood tested for lipids and cholesterol at 70 years of age, then have this test every 5 years.  You may need to have your cholesterol levels checked more often if:  Your lipid or cholesterol levels are high.  You are older than 70 years of age.  You are at high risk for heart disease. Cancer screening Lung Cancer  Lung cancer screening is recommended for adults 20-105 years old who are at high risk for lung cancer because of a history of smoking.  A yearly low-dose CT scan of the lungs is recommended for people who:  Currently smoke.  Have quit within the past 15 years.  Have at least a 30-pack-year history of smoking. A pack year is smoking an average of one pack of cigarettes a day for 1 year.  Yearly screening should continue until it has been 15 years since you quit.  Yearly screening should stop if you develop a health problem that would prevent you from having lung cancer treatment. Breast Cancer  Practice breast self-awareness. This means understanding how your breasts normally appear and feel.  It also means doing regular breast self-exams. Let your health care provider know  about any changes, no matter how small.  If you are in your 20s or 30s, you should have a clinical breast exam (CBE) by a health care provider every 1-3 years as part of a regular health exam.  If you are 53 or older, have a CBE every year. Also consider having a breast X-ray (mammogram) every year.  If you have a family history of breast cancer, talk to your health care provider about  genetic screening.  If you are at high risk for breast cancer, talk to your health care provider about having an MRI and a mammogram every year.  Breast cancer gene (BRCA) assessment is recommended for women who have family members with BRCA-related cancers. BRCA-related cancers include:  Breast.  Ovarian.  Tubal.  Peritoneal cancers.  Results of the assessment will determine the need for genetic counseling and BRCA1 and BRCA2 testing. Cervical Cancer  Your health care provider may recommend that you be screened regularly for cancer of the pelvic organs (ovaries, uterus, and vagina). This screening involves a pelvic examination, including checking for microscopic changes to the surface of your cervix (Pap test). You may be encouraged to have this screening done every 3 years, beginning at age 75.  For women ages 65-65, health care providers may recommend pelvic exams and Pap testing every 3 years, or they may recommend the Pap and pelvic exam, combined with testing for human papilloma virus (HPV), every 5 years. Some types of HPV increase your risk of cervical cancer. Testing for HPV may also be done on women of any age with unclear Pap test results.  Other health care providers may not recommend any screening for nonpregnant women who are considered low risk for pelvic cancer and who do not have symptoms. Ask your health care provider if a screening pelvic exam is right for you.  If you have had past treatment for cervical cancer or a condition that could lead to cancer, you need Pap tests and screening for cancer for at least 20 years after your treatment. If Pap tests have been discontinued, your risk factors (such as having a new sexual partner) need to be reassessed to determine if screening should resume. Some women have medical problems that increase the chance of getting cervical cancer. In these cases, your health care provider may recommend more frequent screening and Pap  tests. Colorectal Cancer  This type of cancer can be detected and often prevented.  Routine colorectal cancer screening usually begins at 70 years of age and continues through 70 years of age.  Your health care provider may recommend screening at an earlier age if you have risk factors for colon cancer.  Your health care provider may also recommend using home test kits to check for hidden blood in the stool.  A small camera at the end of a tube can be used to examine your colon directly (sigmoidoscopy or colonoscopy). This is done to check for the earliest forms of colorectal cancer.  Routine screening usually begins at age 72.  Direct examination of the colon should be repeated every 5-10 years through 70 years of age. However, you may need to be screened more often if early forms of precancerous polyps or small growths are found. Skin Cancer  Check your skin from head to toe regularly.  Tell your health care provider about any new moles or changes in moles, especially if there is a change in a mole's shape or color.  Also tell your health care provider if  you have a mole that is larger than the size of a pencil eraser.  Always use sunscreen. Apply sunscreen liberally and repeatedly throughout the day.  Protect yourself by wearing long sleeves, pants, a wide-brimmed hat, and sunglasses whenever you are outside. Heart disease, diabetes, and high blood pressure  High blood pressure causes heart disease and increases the risk of stroke. High blood pressure is more likely to develop in:  People who have blood pressure in the high end of the normal range (130-139/85-89 mm Hg).  People who are overweight or obese.  People who are African American.  If you are 51-26 years of age, have your blood pressure checked every 3-5 years. If you are 62 years of age or older, have your blood pressure checked every year. You should have your blood pressure measured twice-once when you are at a  hospital or clinic, and once when you are not at a hospital or clinic. Record the average of the two measurements. To check your blood pressure when you are not at a hospital or clinic, you can use:  An automated blood pressure machine at a pharmacy.  A home blood pressure monitor.  If you are between 59 years and 67 years old, ask your health care provider if you should take aspirin to prevent strokes.  Have regular diabetes screenings. This involves taking a blood sample to check your fasting blood sugar level.  If you are at a normal weight and have a low risk for diabetes, have this test once every three years after 70 years of age.  If you are overweight and have a high risk for diabetes, consider being tested at a younger age or more often. Preventing infection Hepatitis B  If you have a higher risk for hepatitis B, you should be screened for this virus. You are considered at high risk for hepatitis B if:  You were born in a country where hepatitis B is common. Ask your health care provider which countries are considered high risk.  Your parents were born in a high-risk country, and you have not been immunized against hepatitis B (hepatitis B vaccine).  You have HIV or AIDS.  You use needles to inject street drugs.  You live with someone who has hepatitis B.  You have had sex with someone who has hepatitis B.  You get hemodialysis treatment.  You take certain medicines for conditions, including cancer, organ transplantation, and autoimmune conditions. Hepatitis C  Blood testing is recommended for:  Everyone born from [birth year] through 1964.  Anyone with known risk factors for hepatitis C. Sexually transmitted infections (STIs)  You should be screened for sexually transmitted infections (STIs) including gonorrhea and chlamydia if:  You are sexually active and are younger than 70 years of age.  You are older than 70 years of age and your health care provider tells you  that you are at risk for this type of infection.  Your sexual activity has changed since you were last screened and you are at an increased risk for chlamydia or gonorrhea. Ask your health care provider if you are at risk.  If you do not have HIV, but are at risk, it may be recommended that you take a prescription medicine daily to prevent HIV infection. This is called pre-exposure prophylaxis (PrEP). You are considered at risk if:  You are sexually active and do not regularly use condoms or know the HIV status of your partner(s).  You take drugs by injection.  You  are sexually active with a partner who has HIV. Talk with your health care provider about whether you are at high risk of being infected with HIV. If you choose to begin PrEP, you should first be tested for HIV. You should then be tested every 3 months for as long as you are taking PrEP. Pregnancy  If you are premenopausal and you may become pregnant, ask your health care provider about preconception counseling.  If you may become pregnant, take 400 to 800 micrograms (mcg) of folic acid every day.  If you want to prevent pregnancy, talk to your health care provider about birth control (contraception). Osteoporosis and menopause  Osteoporosis is a disease in which the bones lose minerals and strength with aging. This can result in serious bone fractures. Your risk for osteoporosis can be identified using a bone density scan.  If you are 35 years of age or older, or if you are at risk for osteoporosis and fractures, ask your health care provider if you should be screened.  Ask your health care provider whether you should take a calcium or vitamin D supplement to lower your risk for osteoporosis.  Menopause may have certain physical symptoms and risks.  Hormone replacement therapy may reduce some of these symptoms and risks. Talk to your health care provider about whether hormone replacement therapy is right for you. Follow  these instructions at home:  Schedule regular health, dental, and eye exams.  Stay current with your immunizations.  Do not use any tobacco products including cigarettes, chewing tobacco, or electronic cigarettes.  If you are pregnant, do not drink alcohol.  If you are breastfeeding, limit how much and how often you drink alcohol.  Limit alcohol intake to no more than 1 drink per day for nonpregnant women. One drink equals 12 ounces of beer, 5 ounces of wine, or 1 ounces of hard liquor.  Do not use street drugs.  Do not share needles.  Ask your health care provider for help if you need support or information about quitting drugs.  Tell your health care provider if you often feel depressed.  Tell your health care provider if you have ever been abused or do not feel safe at home. This information is not intended to replace advice given to you by your health care provider. Make sure you discuss any questions you have with your health care provider. Document Released: 02/27/2011 Document Revised: 01/20/2016 Document Reviewed: 05/18/2015 Elsevier Interactive Patient Education  2017 [employer] Maintenance for Postmenopausal Women Menopause is a normal process in which your reproductive ability comes to an end. This process happens gradually over a span of months to years, usually between the ages of 54 and 70. Menopause is complete when you have missed 12 consecutive menstrual periods. It is important to talk with your health care provider about some of the most common conditions that affect postmenopausal women, such as heart disease, cancer, and bone loss (osteoporosis). Adopting a healthy lifestyle and getting preventive care can help to promote your health and wellness. Those actions can also lower your chances of developing some of these common conditions. What should I know about menopause? During menopause, you may experience a number of symptoms, such  as:  Moderate-to-severe hot flashes.  Night sweats.  Decrease in sex drive.  Mood swings.  Headaches.  Tiredness.  Irritability.  Memory problems.  Insomnia. Choosing to treat or not to treat menopausal changes is an individual decision that you make with your health  care provider. What should I know about hormone replacement therapy and supplements? Hormone therapy products are effective for treating symptoms that are associated with menopause, such as hot flashes and night sweats. Hormone replacement carries certain risks, especially as you become older. If you are thinking about using estrogen or estrogen with progestin treatments, discuss the benefits and risks with your health care provider. What should I know about heart disease and stroke? Heart disease, heart attack, and stroke become more likely as you age. This may be due, in part, to the hormonal changes that your body experiences during menopause. These can affect how your body processes dietary fats, triglycerides, and cholesterol. Heart attack and stroke are both medical emergencies. There are many things that you can do to help prevent heart disease and stroke:  Have your blood pressure checked at least every 1-2 years. High blood pressure causes heart disease and increases the risk of stroke.  If you are 40-14 years old, ask your health care provider if you should take aspirin to prevent a heart attack or a stroke.  Do not use any tobacco products, including cigarettes, chewing tobacco, or electronic cigarettes. If you need help quitting, ask your health care provider.  It is important to eat a healthy diet and maintain a healthy weight.  Be sure to include plenty of vegetables, fruits, low-fat dairy products, and lean protein.  Avoid eating foods that are high in solid fats, added sugars, or salt (sodium).  Get regular exercise. This is one of the most important things that you can do for your health.  Try to  exercise for at least 150 minutes each week. The type of exercise that you do should increase your heart rate and make you sweat. This is known as moderate-intensity exercise.  Try to do strengthening exercises at least twice each week. Do these in addition to the moderate-intensity exercise.  Know your numbers.Ask your health care provider to check your cholesterol and your blood glucose. Continue to have your blood tested as directed by your health care provider. What should I know about cancer screening? There are several types of cancer. Take the following steps to reduce your risk and to catch any cancer development as early as possible. Breast Cancer  Practice breast self-awareness.  This means understanding how your breasts normally appear and feel.  It also means doing regular breast self-exams. Let your health care provider know about any changes, no matter how small.  If you are 40 or older, have a clinician do a breast exam (clinical breast exam or CBE) every year. Depending on your age, family history, and medical history, it may be recommended that you also have a yearly breast X-ray (mammogram).  If you have a family history of breast cancer, talk with your health care provider about genetic screening.  If you are at high risk for breast cancer, talk with your health care provider about having an MRI and a mammogram every year.  Breast cancer (BRCA) gene test is recommended for women who have family members with BRCA-related cancers. Results of the assessment will determine the need for genetic counseling and BRCA1 and for BRCA2 testing. BRCA-related cancers include these types:  Breast. This occurs in males or females.  Ovarian.  Tubal. This may also be called fallopian tube cancer.  Cancer of the abdominal or pelvic lining (peritoneal cancer).  Prostate.  Pancreatic. Cervical, Uterine, and Ovarian Cancer  Your health care provider may recommend that you be screened  regularly  for cancer of the pelvic organs. These include your ovaries, uterus, and vagina. This screening involves a pelvic exam, which includes checking for microscopic changes to the surface of your cervix (Pap test).  For women ages 21-65, health care providers may recommend a pelvic exam and a Pap test every three years. For women ages 50-65, they may recommend the Pap test and pelvic exam, combined with testing for human papilloma virus (HPV), every five years. Some types of HPV increase your risk of cervical cancer. Testing for HPV may also be done on women of any age who have unclear Pap test results.  Other health care providers may not recommend any screening for nonpregnant women who are considered low risk for pelvic cancer and have no symptoms. Ask your health care provider if a screening pelvic exam is right for you.  If you have had past treatment for cervical cancer or a condition that could lead to cancer, you need Pap tests and screening for cancer for at least 20 years after your treatment. If Pap tests have been discontinued for you, your risk factors (such as having a new sexual partner) need to be reassessed to determine if you should start having screenings again. Some women have medical problems that increase the chance of getting cervical cancer. In these cases, your health care provider may recommend that you have screening and Pap tests more often.  If you have a family history of uterine cancer or ovarian cancer, talk with your health care provider about genetic screening.  If you have vaginal bleeding after reaching menopause, tell your health care provider.  There are currently no reliable tests available to screen for ovarian cancer. Lung Cancer  Lung cancer screening is recommended for adults 18-22 years old who are at high risk for lung cancer because of a history of smoking. A yearly low-dose CT scan of the lungs is recommended if you:  Currently smoke.  Have a  history of at least 30 pack-years of smoking and you currently smoke or have quit within the past 15 years. A pack-year is smoking an average of one pack of cigarettes per day for one year. Yearly screening should:  Continue until it has been 15 years since you quit.  Stop if you develop a health problem that would prevent you from having lung cancer treatment. Colorectal Cancer  This type of cancer can be detected and can often be prevented.  Routine colorectal cancer screening usually begins at age 27 and continues through age 9.  If you have risk factors for colon cancer, your health care provider may recommend that you be screened at an earlier age.  If you have a family history of colorectal cancer, talk with your health care provider about genetic screening.  Your health care provider may also recommend using home test kits to check for hidden blood in your stool.  A small camera at the end of a tube can be used to examine your colon directly (sigmoidoscopy or colonoscopy). This is done to check for the earliest forms of colorectal cancer.  Direct examination of the colon should be repeated every 5-10 years until age 66. However, if early forms of precancerous polyps or small growths are found or if you have a family history or genetic risk for colorectal cancer, you may need to be screened more often. Skin Cancer  Check your skin from head to toe regularly.  Monitor any moles. Be sure to tell your health care provider:  About  any new moles or changes in moles, especially if there is a change in a mole's shape or color.  If you have a mole that is larger than the size of a pencil eraser.  If any of your family members has a history of skin cancer, especially at a young age, talk with your health care provider about genetic screening.  Always use sunscreen. Apply sunscreen liberally and repeatedly throughout the day.  Whenever you are outside, protect yourself by wearing long  sleeves, pants, a wide-brimmed hat, and sunglasses. What should I know about osteoporosis? Osteoporosis is a condition in which bone destruction happens more quickly than new bone creation. After menopause, you may be at an increased risk for osteoporosis. To help prevent osteoporosis or the bone fractures that can happen because of osteoporosis, the following is recommended:  If you are 57-8 years old, get at least 1,000 mg of calcium and at least 600 mg of vitamin D per day.  If you are older than age 67 but younger than age 6, get at least 1,200 mg of calcium and at least 600 mg of vitamin D per day.  If you are older than age 62, get at least 1,200 mg of calcium and at least 800 mg of vitamin D per day. Smoking and excessive alcohol intake increase the risk of osteoporosis. Eat foods that are rich in calcium and vitamin D, and do weight-bearing exercises several times each week as directed by your health care provider. What should I know about how menopause affects my mental health? Depression may occur at any age, but it is more common as you become older. Common symptoms of depression include:  Low or sad mood.  Changes in sleep patterns.  Changes in appetite or eating patterns.  Feeling an overall lack of motivation or enjoyment of activities that you previously enjoyed.  Frequent crying spells. Talk with your health care provider if you think that you are experiencing depression. What should I know about immunizations? It is important that you get and maintain your immunizations. These include:  Tetanus, diphtheria, and pertussis (Tdap) booster vaccine.  Influenza every year before the flu season begins.  Pneumonia vaccine.  Shingles vaccine. Your health care provider may also recommend other immunizations. This information is not intended to replace advice given to you by your health care provider. Make sure you discuss any questions you have with your health care  provider. Document Released: 10/06/2005 Document Revised: 03/03/2016 Document Reviewed: 05/18/2015 Elsevier Interactive Patient Education  2017 [employer].  Trochanteric Bursitis Rehab Ask your health care provider which exercises are safe for you. Do exercises exactly as told by your health care provider and adjust them as directed. It is normal to feel mild stretching, pulling, tightness, or discomfort as you do these exercises, but you should stop right away if you feel sudden pain or your pain gets worse.Do not begin these exercises until told by your health care provider. Stretching exercises These exercises warm up your muscles and joints and improve the movement and flexibility of your hip. These exercises also help to relieve pain and stiffness. Exercise A: Iliotibial band stretch   1. Lie on your side with your left / right leg in the top position. 2. Bend your left / right knee and grab your ankle. 3. Slowly bring your knee back so your thigh is behind your body. 4. Slowly lower your knee toward the floor until you feel a gentle stretch on the outside of your  left / right thigh. If you do not feel a stretch and your knee will not fall farther, place the heel of your other foot on top of your outer knee and pull your thigh down farther. 5. Hold this position for __________ seconds. 6. Slowly return to the starting position. Repeat __________ times. Complete this exercise __________ times a day. Strengthening exercises These exercises build strength and endurance in your hip and pelvis. Endurance is the ability to use your muscles for a long time, even after they get tired. Exercise B: Bridge (  hip extensors) 1. Lie on your back on a firm surface with your knees bent and your feet flat on the floor. 2. Tighten your buttocks muscles and lift your buttocks off the floor until your trunk is level with your thighs. You should feel the muscles working in your buttocks and the back of  your thighs. If this exercise is too easy, try doing it with your arms crossed over your chest. 3. Hold this position for __________ seconds. 4. Slowly return to the starting position. 5. Let your muscles relax completely between repetitions. Repeat __________ times. Complete this exercise __________ times a day. Exercise C: Squats ( knee extensors and  quadriceps) 1. Stand in front of a table, with your feet and knees pointing straight ahead. You may rest your hands on the table for balance but not for support. 2. Slowly bend your knees and lower your hips like you are going to sit in a chair.  Keep your weight over your heels, not over your toes.  Keep your lower legs upright so they are parallel with the table legs.  Do not let your hips go lower than your knees.  Do not bend lower than told by your health care provider.  If your hip pain increases, do not bend as low. 3. Hold this position for __________ seconds. 4. Slowly push with your legs to return to standing. Do not use your hands to pull yourself to standing. Repeat __________ times. Complete this exercise __________ times a day. Exercise D: Hip hike 1. Stand sideways on a bottom step. Stand on your left / right leg with your other foot unsupported next to the step. You can hold onto the railing or wall if needed for balance. 2. Keeping your knees straight and your torso square, lift your left / right hip up toward the ceiling. 3. Hold this position for __________ seconds. 4. Slowly let your left / right hip lower toward the floor, past the starting position. Your foot should get closer to the floor. Do not lean or bend your knees. Repeat __________ times. Complete this exercise __________ times a day. Exercise E: Single leg stand 1. Stand near a counter or door frame that you can hold onto for balance as needed. It is helpful to stand in front of a mirror for this exercise so you can watch your hip. 2. Squeeze your left /  right buttock muscles then lift up your other foot. Do not let your left / right hip push out to the side. 3. Hold this position for __________ seconds. Repeat __________ times. Complete this exercise __________ times a day. This information is not intended to replace advice given to you by your health care provider. Make sure you discuss any questions you have with your health care provider. Document Released: 09/21/2004 Document Revised: 04/20/2016 Document Reviewed: 07/30/2015 Elsevier Interactive Patient Education  2017 [employer].

## 2016-12-21 NOTE — Assessment & Plan Note (Signed)
Improved on the carafate, but still having issues. Continue to monitor.

## 2016-12-21 NOTE — Assessment & Plan Note (Signed)
Under good control. Continue current regimen. Continue to monitor. Refills given today. 

## 2016-12-21 NOTE — Assessment & Plan Note (Signed)
Continue BP and cholesterol control. Call with any concerns. To see Dr. Fletcher Anon in the next few weeks.

## 2016-12-21 NOTE — Progress Notes (Signed)
BP 128/82 (BP Location: Left Arm, Patient Position: Sitting, Cuff Size: Normal)   Pulse 60   Temp 98.5 F (36.9 C)   Ht 4' 11.3" (1.506 m)   Wt 146 lb 3.2 oz (66.3 kg)   SpO2 99%   BMI 29.23 kg/m    Subjective:    Patient ID: Marie Parker, female    DOB: [birth year], 71 y.o.   MRN: [id number]  HPI: Marie Parker is a 71 y.o. female presenting on 12/21/2016 for comprehensive medical examination. Current medical complaints include:  HYPERTENSION / HYPERLIPIDEMIA Satisfied with current treatment? yes Duration of hypertension: chronic BP monitoring frequency: not checking BP medication side effects: no Past BP meds: amlodipine, atenolol, benazepril, hydralazine Duration of hyperlipidemia: chronic Cholesterol medication side effects: no Cholesterol supplements: niacin Past cholesterol medications: atorvastain (lipitor) and rosuvastatin (crestor) Medication compliance: excellent compliance Aspirin: yes Recent stressors: no Recurrent headaches: no Visual changes: no Palpitations: no Dyspnea: no Chest pain: no Lower extremity edema: no Dizzy/lightheaded: no  GERD GERD control status: stable  Satisfied with current treatment? yes Heartburn frequency: occasionally Medication side effects: no  Medication compliance: excellent Dysphagia: no Odynophagia:  no Hematemesis: no Blood in stool: no EGD: no  ANXIETY/STRESS Duration:stable Anxious mood: yes  Excessive worrying: yes Irritability: no  Sweating: no Nausea: yes Palpitations:no Hyperventilation: no Panic attacks: no Agoraphobia: no  Obscessions/compulsions: no Depressed mood: no Depression screen [facility] 2/9 12/21/2016 04/18/2016 12/21/2015  Decreased Interest 0 0 0  Down, Depressed, Hopeless 0 0 0  PHQ - 2 Score 0 0 0  Altered sleeping - 3 -  Tired, decreased energy - 0 -  Change in appetite - 0 -  Feeling bad or failure about yourself  - 0 -  Trouble concentrating - 0 -  Moving slowly or  fidgety/restless - 0 -  Suicidal thoughts - 0 -  PHQ-9 Score - 3 -  Difficult doing work/chores - Not difficult at all -   Anhedonia: no Weight changes: no Insomnia: no   Hypersomnia: no Fatigue/loss of energy: no Feelings of worthlessness: no Feelings of guilt: no Impaired concentration/indecisiveness: no Suicidal ideations: no  Crying spells: no Recent Stressors/Life Changes: no  She currently lives with: husband Menopausal Symptoms: no  Functional Status Survey: Is the patient deaf or have difficulty hearing?: No Does the patient have difficulty seeing, even when wearing glasses/contacts?: Yes (Followed by eye doctor) Does the patient have difficulty concentrating, remembering, or making decisions?: No Does the patient have difficulty walking or climbing stairs?: Yes (Fractured knee in the past) Does the patient have difficulty dressing or bathing?: No Does the patient have difficulty doing errands alone such as visiting a doctor's office or shopping?: No  Fall Risk  12/21/2016 04/18/2016  Falls in the past year? Yes Yes  Number falls in past yr: 1 1  Injury with Fall? Yes Yes  Risk for fall due to : - History of fall(s)  Follow up - Education provided    Depression Screen Depression screen [facility] 2/9 12/21/2016 04/18/2016 12/21/2015  Decreased Interest 0 0 0  Down, Depressed, Hopeless 0 0 0  PHQ - 2 Score 0 0 0  Altered sleeping - 3 -  Tired, decreased energy - 0 -  Change in appetite - 0 -  Feeling bad or failure about yourself  - 0 -  Trouble concentrating - 0 -  Moving slowly or fidgety/restless - 0 -  Suicidal thoughts - 0 -  PHQ-9 Score - 3 -  Difficult doing work/chores -  Not difficult at all -    Advanced Directives Does patient have a HCPOA?    no Does patient have a living will or MOST form?  yes  Past Medical History:  Past Medical History:  Diagnosis Date  . Anxiety   . Arthritis   . CAD (coronary artery disease)   . Cancer (HCC)    squamous cell    . Chronic kidney disease   . Depression   . Generalized osteoarthritis   . GERD (gastroesophageal reflux disease)   . Gout   . History of endoscopy 11/2012   Gastritis  . Hyperlipidemia   . Hypertension   . IFG (impaired fasting glucose)   . Insomnia   . Menopause   . Osteoporosis   . Parathyroid adenoma   . Personal history of tobacco use, presenting hazards to health 02/02/2016  . PVD (peripheral vascular disease) ([place])   . PVD (peripheral vascular disease) ([place])   . Rotator cuff syndrome of shoulder and allied disorders   . Tobacco abuse     Surgical History:  Past Surgical History:  Procedure Laterality Date  . BREAST BIOPSY Right 2014   neg  . BREAST BIOPSY Left 1970's   neg  . BREAST FIBROADENOMA SURGERY    . CARDIAC CATHETERIZATION  09/17/2007   Insignificant CAD with 40-50% stenosis of mid LAD.   [name] ILIAC ARTERY STENT     right iliac artery stent   . ORIF PATELLA Left 03/22/2016   Dr. Tristan Schroeder  . PARATHYROIDECTOMY    . SKIN BIOPSY     precancerous    Medications:  Current Outpatient Prescriptions on File Prior to Visit  Medication Sig  . acetaminophen (TYLENOL) 500 MG tablet Take 1,000 mg by mouth.  [name] NIACIN PO Take 1,000 mg by mouth daily.  . sucralfate (CARAFATE) 1 g tablet Take 1 tablet (1 g total) by mouth 4 (four) times daily -  with meals and at bedtime.  . traMADol (ULTRAM) 50 MG tablet Take 1 tablet (50 mg total) by mouth every 8 (eight) hours as needed.  . triamcinolone cream (KENALOG) 0.1 %   . TURMERIC PO Take by mouth.   No current facility-administered medications on file prior to visit.     Allergies:  Allergies  Allergen Reactions  . Crestor [Rosuvastatin]   . Cyclobenzaprine     Other reaction(s): OTHER  . Erythromycin     GI upset   . Fosamax [Alendronate Sodium]   . Neosporin [Neomycin-Bacitracin Zn-Polymyx]     Social History:  Social History   Social History  . Marital status: Married    Spouse name: N/A  . Number of  children: N/A  . Years of education: N/A   Occupational History  . Not on file.   Social History Main Topics  . Smoking status: Former Smoker    Packs/day: 1.00    Years: 42.00    Types: Cigarettes    Quit date: 10/09/2001  . Smokeless tobacco: Never Used  . Alcohol use No  . Drug use: No  . Sexual activity: No   Other Topics Concern  . Not on file   Social History Narrative  . No narrative on file   History  Smoking Status  . Former Smoker  . Packs/day: 1.00  . Years: 42.00  . Types: Cigarettes  . Quit date: 10/09/2001  Smokeless Tobacco  . Never Used   History  Alcohol Use No    Family History:  Family History  Problem  Relation Age of Onset  . Heart attack Father 58  . Alcohol abuse Father   . Cancer Father   . Heart disease Father   . Liver disease Father   . Hypertension Father   . Heart disease Sister 67    CAD; s/p stent placement  . Stroke Sister   . AAA (abdominal aortic aneurysm) Sister   . Dementia Sister   . Stroke Mother   . Hypertension Mother   . Diabetes Mother   . Stroke Maternal Grandmother   . Heart attack Maternal Grandfather   . Stroke Paternal Grandmother   . Breast cancer Maternal Aunt     Past medical history, surgical history, medications, allergies, family history and social history reviewed with patient today and changes made to appropriate areas of the chart.   Review of Systems  Constitutional: Negative.   HENT: Positive for congestion and tinnitus. Negative for ear discharge, ear pain, hearing loss, nosebleeds, sinus pain and sore throat.   Eyes: Negative.   Respiratory: Negative.  Negative for stridor.   Cardiovascular: Negative.   Gastrointestinal: Positive for abdominal pain, diarrhea and heartburn. Negative for blood in stool, constipation, melena, nausea and vomiting.  Genitourinary: Negative.   Musculoskeletal: Positive for joint pain. Negative for back pain, falls, myalgias and neck pain.  Skin: Negative.     Neurological: Negative.   Endo/Heme/Allergies: Positive for environmental allergies. Negative for polydipsia. Bruises/bleeds easily.  Psychiatric/Behavioral: Negative.     All other ROS negative except what is listed above and in the HPI.      Objective:    BP 128/82 (BP Location: Left Arm, Patient Position: Sitting, Cuff Size: Normal)   Pulse 60   Temp 98.5 F (36.9 C)   Ht 4' 11.3" (1.506 m)   Wt 146 lb 3.2 oz (66.3 kg)   SpO2 99%   BMI 29.23 kg/m   Wt Readings from Last 3 Encounters:  12/21/16 146 lb 3.2 oz (66.3 kg)  12/12/16 145 lb 12.8 oz (66.1 kg)  12/06/16 146 lb (66.2 kg)     Hearing Screening   125Hz  250Hz  500Hz  1000Hz  2000Hz  3000Hz  4000Hz  6000Hz  8000Hz   Right ear:   40 Fail 40  Fail    Left ear:   20 Fail 20  20      Visual Acuity Screening   Right eye Left eye Both eyes  Without correction:     With correction: 20/25 20/20 20/20     Physical Exam  Constitutional: She is oriented to person, place, and time. She appears well-developed and well-nourished. No distress.  HENT:  Head: Normocephalic and atraumatic.  Right Ear: Hearing, tympanic membrane, external ear and ear canal normal.  Left Ear: Hearing, tympanic membrane, external ear and ear canal normal.  Nose: Nose normal.  Mouth/Throat: Uvula is midline, oropharynx is clear and moist and mucous membranes are normal. No oropharyngeal exudate.  Eyes: Conjunctivae, EOM and lids are normal. Pupils are equal, round, and reactive to light. Right eye exhibits no discharge. Left eye exhibits no discharge. No scleral icterus.  Neck: Normal range of motion. Neck supple. No JVD present. No tracheal deviation present. No thyromegaly present.  Cardiovascular: Normal rate, regular rhythm, normal heart sounds and intact distal pulses.  Exam reveals no gallop and no friction rub.   No murmur heard. Pulmonary/Chest: Effort normal and breath sounds normal. No stridor. No respiratory distress. She has no wheezes. She has no  rales. She exhibits no tenderness.  Abdominal: Soft. Bowel sounds are normal. She  exhibits no distension and no mass. There is no tenderness. There is no rebound and no guarding.  Genitourinary:  Genitourinary Comments: Breast and pelvic exams deferred with shared decsicion making  Musculoskeletal: Normal range of motion. She exhibits no edema, tenderness or deformity.  Lymphadenopathy:    She has no cervical adenopathy.  Neurological: She is alert and oriented to person, place, and time. She has normal reflexes. She displays normal reflexes. No cranial nerve deficit. She exhibits normal muscle tone. Coordination normal.  Skin: Skin is warm, dry and intact. No rash noted. She is not diaphoretic. No erythema. No pallor.  Psychiatric: She has a normal mood and affect. Her speech is normal and behavior is normal. Judgment and thought content normal. Cognition and memory are normal.  Nursing note and vitals reviewed.   6CIT Screen 12/21/2016  What Year? 0 points  What month? 0 points  What time? 0 points  Count back from 20 0 points  Months in reverse 2 points  Repeat phrase 4 points  Total Score 6     Results for orders placed or performed in visit on 37/85/88  Basic metabolic panel  Result Value Ref Range   Glucose 114 (H) 65 - 99 mg/dL   BUN 11 8 - 27 mg/dL   Creatinine, Ser 0.95 0.57 - 1.00 mg/dL   GFR calc non Af Amer 61 >59 mL/min/1.73   GFR calc Af Amer 71 >59 mL/min/1.73   BUN/Creatinine Ratio 12 12 - 28   Sodium 142 134 - 144 mmol/L   Potassium 5.5 (H) 3.5 - 5.2 mmol/L   Chloride 101 96 - 106 mmol/L   CO2 24 18 - 29 mmol/L   Calcium 9.6 8.7 - 10.3 mg/dL      Assessment & Plan:   Problem List Items Addressed This Visit      Cardiovascular and Mediastinum   Peripheral vascular disease (HCC)    Continue BP and cholesterol control. Call with any concerns.       Relevant Medications   hydrALAZINE (APRESOLINE) 25 MG tablet   benazepril (LOTENSIN) 40 MG tablet    atorvastatin (LIPITOR) 40 MG tablet   atenolol (TENORMIN) 50 MG tablet   aspirin 81 MG tablet   amLODipine (NORVASC) 10 MG tablet   Other Relevant Orders   CBC with Differential/Platelet   Comprehensive metabolic panel   UA/M w/rflx Culture, Routine   CAD (coronary artery disease)    Continue BP and cholesterol control. Call with any concerns. To see Dr. Fletcher Anon in the next few weeks.      Relevant Medications   hydrALAZINE (APRESOLINE) 25 MG tablet   benazepril (LOTENSIN) 40 MG tablet   atorvastatin (LIPITOR) 40 MG tablet   atenolol (TENORMIN) 50 MG tablet   aspirin 81 MG tablet   amLODipine (NORVASC) 10 MG tablet   Other Relevant Orders   CBC with Differential/Platelet   Comprehensive metabolic panel   UA/M w/rflx Culture, Routine     Digestive   GERD (gastroesophageal reflux disease)    Improved on the carafate, but still having issues. Continue to monitor.       Relevant Medications   pantoprazole (PROTONIX) 40 MG tablet   clidinium-chlordiazePOXIDE (LIBRAX) 5-2.5 MG capsule   Other Relevant Orders   CBC with Differential/Platelet   Comprehensive metabolic panel   UA/M w/rflx Culture, Routine     Genitourinary   Benign hypertensive renal disease    Under good control. Continue current regimen. Continue to monitor. Refills given today.  Relevant Orders   CBC with Differential/Platelet   Comprehensive metabolic panel   Microalbumin, Urine Waived   TSH   UA/M w/rflx Culture, Routine     Other   Hyperlipidemia    Under good control. Continue current regimen. Continue to monitor. Refills given today.      Relevant Medications   hydrALAZINE (APRESOLINE) 25 MG tablet   benazepril (LOTENSIN) 40 MG tablet   atorvastatin (LIPITOR) 40 MG tablet   atenolol (TENORMIN) 50 MG tablet   aspirin 81 MG tablet   amLODipine (NORVASC) 10 MG tablet   Other Relevant Orders   CBC with Differential/Platelet   Comprehensive metabolic panel   Lipid Panel w/o Chol/HDL Ratio     UA/M w/rflx Culture, Routine   Gout    Under good control. Continue current regimen. Continue to monitor. Refills given today.      Relevant Orders   CBC with Differential/Platelet   Comprehensive metabolic panel   UA/M w/rflx Culture, Routine   Uric acid   Multiple pulmonary nodules determined by computed tomography of lung    Follow up with CT as needed.       Personal history of tobacco use, presenting hazards to health    Follow up with CT as needed.       Acute anxiety    Under good control. Continue current regimen. Continue to monitor. Refills given today.      Relevant Medications   sertraline (ZOLOFT) 50 MG tablet   Other Relevant Orders   CBC with Differential/Platelet   Comprehensive metabolic panel   TSH   UA/M w/rflx Culture, Routine    Other Visit Diagnoses    Medicare annual wellness visit, subsequent    -  Primary   Preventative care discussed today. Call with any concerns.    Breast cancer screening       Mammogram ordered today   Relevant Orders   MM DIGITAL SCREENING BILATERAL   Trochanteric bursitis of right hip       Will start exercises. Call if not getting better and we will do injection.       Preventative Services:  Health Risk Assessment and Personalized Prevention Plan: Done today Bone Mass Measurements:  Up to date Breast Cancer Screening: Ordered today CVD Screening: Done today Cervical Cancer Screening: N/A Colon Cancer Screening: Up to date Depression Screening: Done today Diabetes Screening: Done today Glaucoma Screening: See your eye doctor Hepatitis B vaccine: N/A Hepatitis C screening: Up to date HIV Screening: Up to date Flu Vaccine: Get in October Lung cancer Screening: Up to date Obesity Screening: Done today Pneumonia Vaccines (2): up to date STI Screening: N/A  Follow up plan: Return in about 6 months (around 06/22/2017) for 6 month follow up.   LABORATORY TESTING:  - Pap smear: not  applicable  IMMUNIZATIONS:   - Tdap: Tetanus vaccination status reviewed: last tetanus booster within 10 years. - Influenza: Up to date - Pneumovax: Up to date - Prevnar: Up to date - Zostavax vaccine: Up to date  SCREENING: -Mammogram: Ordered today  - Colonoscopy: Up to date  - Bone Density: Up to date  -Hearing Test: Ordered today   PATIENT COUNSELING:   Advised to take 1 mg of folate supplement per day if capable of pregnancy.   Sexuality: Discussed sexually transmitted diseases, partner selection, use of condoms, avoidance of unintended pregnancy  and contraceptive alternatives.   Advised to avoid cigarette smoking.  I discussed with the patient that most people either abstain  from alcohol or drink within safe limits (<=14/week and <=4 drinks/occasion for males, <=7/weeks and <= 3 drinks/occasion for females) and that the risk for alcohol disorders and other health effects rises proportionally with the number of drinks per week and how often a drinker exceeds daily limits.  Discussed cessation/primary prevention of drug use and availability of treatment for abuse.   Diet: Encouraged to adjust caloric intake to maintain  or achieve ideal body weight, to reduce intake of dietary saturated fat and total fat, to limit sodium intake by avoiding high sodium foods and not adding table salt, and to maintain adequate dietary potassium and calcium preferably from fresh fruits, vegetables, and low-fat dairy products.    stressed the importance of regular exercise  Injury prevention: Discussed safety belts, safety helmets, smoke detector, smoking near bedding or upholstery.   Dental health: Discussed importance of regular tooth brushing, flossing, and dental visits.    NEXT PREVENTATIVE PHYSICAL DUE IN 1 YEAR. Return in about 6 months (around 06/22/2017) for 6 month follow up.

## 2016-12-21 NOTE — Assessment & Plan Note (Signed)
Follow up with CT as needed.

## 2016-12-21 NOTE — Assessment & Plan Note (Signed)
Continue BP and cholesterol control. Call with any concerns.

## 2016-12-22 ENCOUNTER — Encounter: Payer: Self-pay | Admitting: Family Medicine

## 2016-12-22 LAB — COMPREHENSIVE METABOLIC PANEL
ALK PHOS: 80 IU/L (ref 39–117)
ALT: 17 IU/L (ref 0–32)
AST: 19 IU/L (ref 0–40)
Albumin/Globulin Ratio: 1.9 (ref 1.2–2.2)
Albumin: 4.6 g/dL (ref 3.6–4.8)
BUN/Creatinine Ratio: 12 (ref 12–28)
BUN: 11 mg/dL (ref 8–27)
Bilirubin Total: 0.4 mg/dL (ref 0.0–1.2)
CALCIUM: 9.4 mg/dL (ref 8.7–10.3)
CO2: 25 mmol/L (ref 18–29)
CREATININE: 0.93 mg/dL (ref 0.57–1.00)
Chloride: 103 mmol/L (ref 96–106)
GFR calc Af Amer: 73 mL/min/{1.73_m2} (ref >59)
GFR, EST NON AFRICAN AMERICAN: 63 mL/min/{1.73_m2} (ref >59)
GLOBULIN, TOTAL: 2.4 g/dL (ref 1.5–4.5)
GLUCOSE: 111 mg/dL — AB (ref 65–99)
Potassium: 4.6 mmol/L (ref 3.5–5.2)
Sodium: 144 mmol/L (ref 134–144)
Total Protein: 7 g/dL (ref 6.0–8.5)

## 2016-12-22 LAB — CBC WITH DIFFERENTIAL/PLATELET
BASOS ABS: 0 10*3/uL (ref 0.0–0.2)
Basos: 0 %
EOS (ABSOLUTE): 0.4 10*3/uL (ref 0.0–0.4)
EOS: 6 %
HEMATOCRIT: 39.2 % (ref 34.0–46.6)
Hemoglobin: 12.6 g/dL (ref 11.1–15.9)
Immature Grans (Abs): 0 10*3/uL (ref 0.0–0.1)
Immature Granulocytes: 0 %
Lymphocytes Absolute: 1.4 10*3/uL (ref 0.7–3.1)
Lymphs: 24 %
MCH: 28.9 pg (ref 26.6–33.0)
MCHC: 32.1 g/dL (ref 31.5–35.7)
MCV: 90 fL (ref 79–97)
MONOS ABS: 0.3 10*3/uL (ref 0.1–0.9)
Monocytes: 6 %
Neutrophils Absolute: 3.8 10*3/uL (ref 1.4–7.0)
Neutrophils: 64 %
Platelets: 259 10*3/uL (ref 150–379)
RBC: 4.36 x10E6/uL (ref 3.77–5.28)
RDW: 14.9 % (ref 12.3–15.4)
WBC: 5.9 10*3/uL (ref 3.4–10.8)

## 2016-12-22 LAB — URIC ACID: Uric Acid: 7.7 mg/dL — ABNORMAL HIGH (ref 2.5–7.1)

## 2016-12-22 LAB — LIPID PANEL W/O CHOL/HDL RATIO
CHOLESTEROL TOTAL: 138 mg/dL (ref 100–199)
HDL: 51 mg/dL (ref >39)
LDL CALC: 59 mg/dL (ref 0–99)
TRIGLYCERIDES: 139 mg/dL (ref 0–149)
VLDL CHOLESTEROL CAL: 28 mg/dL (ref 5–40)

## 2016-12-22 LAB — TSH: TSH: 2.67 u[IU]/mL (ref 0.450–4.500)

## 2016-12-28 ENCOUNTER — Ambulatory Visit (INDEPENDENT_AMBULATORY_CARE_PROVIDER_SITE_OTHER): Payer: Medicare Other | Admitting: Cardiovascular Disease

## 2016-12-28 ENCOUNTER — Encounter: Payer: Self-pay | Admitting: Cardiovascular Disease

## 2016-12-28 VITALS — BP 142/70 | Ht 59.0 in | Wt 148.2 lb

## 2016-12-28 DIAGNOSIS — I739 Peripheral vascular disease, unspecified: Secondary | ICD-10-CM | POA: Diagnosis not present

## 2016-12-28 DIAGNOSIS — R079 Chest pain, unspecified: Secondary | ICD-10-CM

## 2016-12-28 DIAGNOSIS — E782 Mixed hyperlipidemia: Secondary | ICD-10-CM | POA: Diagnosis not present

## 2016-12-28 DIAGNOSIS — I10 Essential (primary) hypertension: Secondary | ICD-10-CM | POA: Diagnosis not present

## 2016-12-28 NOTE — Patient Instructions (Addendum)
Medication Instructions:  Your physician recommends that you continue on your current medications as directed. Please refer to the Current Medication list given to you today.   Labwork: NONE  Testing/Procedures: Your physician has requested that you have a lexiscan myoview. For further information please visit [email or web address]. Please follow instruction sheet, as given.   Marie Parker  Your caregiver has ordered a Stress Test with nuclear imaging. The purpose of this test is to evaluate the blood supply to your heart muscle. This procedure is referred to as a "Non-Invasive Stress Test." This is because other than having an IV started in your vein, nothing is inserted or "invades" your body. Cardiac stress tests are done to find areas of poor blood flow to the heart by determining the extent of coronary artery disease (CAD). Some patients exercise on a treadmill, which naturally increases the blood flow to your heart, while others who are  unable to walk on a treadmill due to physical limitations have a pharmacologic/chemical stress agent called Lexiscan . This medicine will mimic walking on a treadmill by temporarily increasing your coronary blood flow.   Please note: these test may take anywhere between 2-4 hours to complete  PLEASE REPORT TO [facility] AT THE FIRST DESK WILL DIRECT YOU WHERE TO GO  Date of Procedure:_________05/11/18  FRIDAY______________  Arrival Time for Procedure:____08:45 AM________________  Instructions regarding medication:    _X_:  Hold betablocker ATENOLOL night before procedure and morning of procedure   PLEASE NOTIFY THE OFFICE AT LEAST 24 HOURS IN ADVANCE IF YOU ARE UNABLE TO KEEP YOUR APPOINTMENT.  [phone] AND  PLEASE NOTIFY NUCLEAR MEDICINE AT [facility] AT LEAST 24 HOURS IN ADVANCE IF YOU ARE UNABLE TO KEEP YOUR APPOINTMENT. [phone]  How to prepare for your Myoview test:  1. Do not eat or drink after  midnight 2. No caffeine for 24 hours prior to test 3. No smoking 24 hours prior to test. 4. Your medication may be taken with water.  If your doctor stopped a medication because of this test, do not take that medication. 5. Ladies, please do not wear dresses.  Skirts or pants are appropriate. Please wear a short sleeve shirt. 6. No perfume, cologne or lotion. 7. Wear comfortable walking shoes. No heels!       Follow-Up: Your physician wants you to follow-up in: [place]. You will receive a reminder letter in the mail two months in advance. If you don't receive a letter, please call our office to schedule the follow-up appointment.   If you need a refill on your cardiac medications before your next appointment, please call your pharmacy.   Cardiac Nuclear Scan A cardiac nuclear scan is a test that measures blood flow to the heart when a person is resting and when he or she is exercising. The test looks for problems such as:  Not enough blood reaching a portion of the heart.  The heart muscle not working normally. You may need this test if:  You have heart disease.  You have had abnormal lab results.  You have had heart surgery or angioplasty.  You have chest pain.  You have shortness of breath. In this test, a radioactive dye (tracer) is injected into your bloodstream. After the tracer has traveled to your heart, an imaging device is used to measure how much of the tracer is absorbed by or distributed to various areas of your heart. This procedure is usually done at  a hospital and takes 2-4 hours. Tell a health care provider about:  Any allergies you have.  All medicines you are taking, including vitamins, herbs, eye drops, creams, and over-the-counter medicines.  Any problems you or family members have had with the use of anesthetic medicines.  Any blood disorders you have.  Any surgeries you have had.  Any medical conditions you have.  Whether you are  pregnant or may be pregnant. What are the risks? Generally, this is a safe procedure. However, problems may occur, including:  Serious chest pain and heart attack. This is only a risk if the stress portion of the test is done.  Rapid heartbeat.  Sensation of warmth in your chest. This usually passes quickly. What happens before the procedure?  Ask your health care provider about changing or stopping your regular medicines. This is especially important if you are taking diabetes medicines or blood thinners.  Remove your jewelry on the day of the procedure. What happens during the procedure?  An IV tube will be inserted into one of your veins.  Your health care provider will inject a small amount of radioactive tracer through the tube.  You will wait for 20-40 minutes while the tracer travels through your bloodstream.  Your heart activity will be monitored with an electrocardiogram (ECG).  You will lie down on an exam table.  Images of your heart will be taken for about 15-20 minutes.  You may be asked to exercise on a treadmill or stationary bike. While you exercise, your heart's activity will be monitored with an ECG, and your blood pressure will be checked. If you are unable to exercise, you may be given a medicine to increase blood flow to parts of your heart.  When blood flow to your heart has peaked, a tracer will again be injected through the IV tube.  After 20-40 minutes, you will get back on the exam table and have more images taken of your heart.  When the procedure is over, your IV tube will be removed. The procedure may vary among health care providers and hospitals. Depending on the type of tracer used, scans may need to be repeated 3-4 hours later. What happens after the procedure?  Unless your health care provider tells you otherwise, you may return to your normal schedule, including diet, activities, and medicines.  Unless your health care provider tells you  otherwise, you may increase your fluid intake. This will help flush the contrast dye from your body. Drink enough fluid to keep your urine clear or pale yellow.  It is up to you to get your test results. Ask your health care provider, or the department that is doing the test, when your results will be ready. Summary  A cardiac nuclear scan measures the blood flow to the heart when a person is resting and when he or she is exercising.  You may need this test if you are at risk for heart disease.  Tell your health care provider if you are pregnant.  Unless your health care provider tells you otherwise, increase your fluid intake. This will help flush the contrast dye from your body. Drink enough fluid to keep your urine clear or pale yellow. This information is not intended to replace advice given to you by your health care provider. Make sure you discuss any questions you have with your health care provider. Document Released: 09/08/2004 Document Revised: 08/16/2016 Document Reviewed: 07/23/2013 Elsevier Interactive Patient Education  2017 [employer].

## 2016-12-28 NOTE — Progress Notes (Signed)
Cardiology Office Note   Date:  12/28/2016   ID:  Marie Parker, DOB July 19, 1946, MRN [id number]  PCP:  Park Liter, DO  Cardiologist:   Kathlyn Sacramento, MD   Chief Complaint  Patient presents with  . New Evaluation    Recently in ED for chest pressure(stinging) on 12/06/16; Occasionally SOB with exertion. Legs swell at times when standing alot throught the day. Denies palpitations. History GI issues, rule out cardiac cause of chest pain.      History of Present Illness: Marie Parker is a 71 y.o. female who was referred by Dr. Wynetta Emery for evaluation of chest pain. The patient has known history of left bundle branch block, GERD, peripheral arterial disease and previous tobacco use. She had cardiac catheterization done in 2009 which showed 40% LAD stenosis. She is known to have peripheral arterial disease and reports having right iliac stent done about 18 years ago when she was a smoker. She quit smoking about 17 years ago. She was seen last month in the emergency room for chest pain with negative basic workup. The chest pain happen last month and was described as tightness feeling which lasted for about 5-10 minutes. Her blood pressure was elevated at that time and she became very anxious. She does have symptoms of heartburn. She has not had recurrent chest pain since her emergency room visit although she does complain of mild exertional dyspnea. No recent exertional chest pain. She has strong family history of cardiovascular disease. She has known history of refractory hypertension and reports previous evaluation by nephrology including renal artery duplex.    Past Medical History:  Diagnosis Date  . Anxiety   . Arthritis   . CAD (coronary artery disease)   . Cancer (HCC)    squamous cell  . Chronic kidney disease   . Depression   . Generalized osteoarthritis   . GERD (gastroesophageal reflux disease)   . Gout   . History of endoscopy 11/2012   Gastritis  .  Hyperlipidemia   . Hypertension   . IFG (impaired fasting glucose)   . Insomnia   . Menopause   . Osteoporosis   . Parathyroid adenoma   . Personal history of tobacco use, presenting hazards to health 02/02/2016  . PVD (peripheral vascular disease) ([place])   . PVD (peripheral vascular disease) ([place])   . Rotator cuff syndrome of shoulder and allied disorders   . Tobacco abuse     Past Surgical History:  Procedure Laterality Date  . BREAST BIOPSY Right 2014   neg  . BREAST BIOPSY Left 1970's   neg  . BREAST FIBROADENOMA SURGERY    . CARDIAC CATHETERIZATION  09/17/2007   Insignificant CAD with 40-50% stenosis of mid LAD.   [name] ILIAC ARTERY STENT     right iliac artery stent   . ORIF PATELLA Left 03/22/2016   Dr. Tristan Schroeder  . PARATHYROIDECTOMY    . SKIN BIOPSY     precancerous     Current Outpatient Prescriptions  Medication Sig Dispense Refill  . acetaminophen (TYLENOL) 500 MG tablet Take 1,000 mg by mouth.    [name] amLODipine (NORVASC) 10 MG tablet Take 1 tablet (10 mg total) by mouth daily. 90 tablet 1  . aspirin 81 MG tablet Take 1 tablet (81 mg total) by mouth daily. 90 tablet 3  . atenolol (TENORMIN) 50 MG tablet Take 1 tablet (50 mg total) by mouth daily. 90 tablet 1  . atorvastatin (LIPITOR) 40 MG tablet Take 1 tablet (  40 mg total) by mouth daily. 90 tablet 1  . benazepril (LOTENSIN) 40 MG tablet Take 1 tablet (40 mg total) by mouth daily. 90 tablet 1  . clidinium-chlordiazePOXIDE (LIBRAX) 5-2.5 MG capsule TAKE 1 CAPSULE BY MOUTH TWICE DAILY FOR LUNCH/SUPPER. 180 capsule 1  . clonazePAM (KLONOPIN) 0.5 MG tablet Take 1 tablet (0.5 mg total) by mouth at bedtime as needed. 30 tablet 0  . fluticasone (FLONASE) 50 MCG/ACT nasal spray Place 2 sprays into both nostrils daily. 16 g 12  . hydrALAZINE (APRESOLINE) 25 MG tablet Take 1 tablet (25 mg total) by mouth 2 (two) times daily. 180 tablet 1  . NIACIN PO Take 1,000 mg by mouth daily.    . pantoprazole (PROTONIX) 40 MG tablet Take 1  tablet (40 mg total) by mouth daily. 90 tablet 1  . sertraline (ZOLOFT) 50 MG tablet Take 0.5 tablets (25 mg total) by mouth daily. 45 tablet 1  . sucralfate (CARAFATE) 1 g tablet Take 1 tablet (1 g total) by mouth 4 (four) times daily -  with meals and at bedtime. 90 tablet 2  . traMADol (ULTRAM) 50 MG tablet Take 1 tablet (50 mg total) by mouth every 8 (eight) hours as needed. 30 tablet 0  . triamcinolone cream (KENALOG) 0.1 %     . TURMERIC PO Take by mouth.     No current facility-administered medications for this visit.     Allergies:   Crestor [rosuvastatin]; Cyclobenzaprine; Erythromycin; Fosamax [alendronate sodium]; and Neosporin [neomycin-bacitracin zn-polymyx]    Social History:  The patient  reports that she quit smoking about 15 years ago. Her smoking use included Cigarettes. She has a 42.00 pack-year smoking history. She has never used smokeless tobacco. She reports that she does not drink alcohol or use drugs.   Family History:  The patient's family history includes AAA (abdominal aortic aneurysm) in her sister; Alcohol abuse in her father; Breast cancer in her maternal aunt; Cancer in her father; Dementia in her sister; Diabetes in her mother; Heart attack in her maternal grandfather; Heart attack (age of onset: 69) in her father; Heart disease in her father; Heart disease (age of onset: 18) in her sister; Hypertension in her father and mother; Liver disease in her father; Stroke in her maternal grandmother, mother, paternal grandmother, and sister.    ROS:  Please see the history of present illness.   Otherwise, review of systems are positive for none.   All other systems are reviewed and negative.    PHYSICAL EXAM: VS:  BP (!) 142/70 (BP Location: Right Arm, Patient Position: Sitting, Cuff Size: Normal)   Ht 4\' 11"  (1.499 m)   Wt 148 lb 4 oz (67.2 kg)   BMI 29.94 kg/m  , BMI Body mass index is 29.94 kg/m. GEN: Well nourished, well developed, in no acute distress  HEENT:  normal  Neck: no JVD, carotid bruits, or masses Cardiac: RRR; no murmurs, rubs, or gallops,no edema  Respiratory:  clear to auscultation bilaterally, normal work of breathing GI: soft, nontender, nondistended, + BS MS: no deformity or atrophy  Skin: warm and dry, no rash Neuro:  Strength and sensation are intact Psych: euthymic mood, full affect Vascular:  Distal pulses are palpable.  EKG:  EKG is ordered today. The ekg ordered today demonstrates  normal sinus rhythm with left bundle branch block.   Recent Labs: 12/06/2016: Hemoglobin 12.6 12/21/2016: ALT 17; BUN 11; Creatinine, Ser 0.93; Platelets 259; Potassium 4.6; Sodium 144; TSH 2.670  Lipid Panel    Component Value Date/Time   CHOL 138 12/21/2016 0848   CHOL 129 06/21/2016 0839   TRIG 139 12/21/2016 0848   TRIG 136 06/21/2016 0839   HDL 51 12/21/2016 0848   VLDL 27 06/21/2016 0839   LDLCALC 59 12/21/2016 0848      Wt Readings from Last 3 Encounters:  12/28/16 148 lb 4 oz (67.2 kg)  12/21/16 146 lb 3.2 oz (66.3 kg)  12/12/16 145 lb 12.8 oz (66.1 kg)       PAD Screen 12/28/2016  Previous PAD dx? No  Previous surgical procedure? Yes  Dates of procedures iliac stent over 15-20 years ago.  Pain with walking? Yes  Subsides with rest? Yes  Feet/toe relief with dangling? No  Painful, non-healing ulcers? No  Extremities discolored? No      ASSESSMENT AND PLAN:  1.  Atypical chest pain: Multiple risk factors for coronary artery disease with chronic left bundle branch block. I recommend evaluation with a pharmacologic nuclear stress test. Continue treatment of risk factors.  2. Essential hypertension, blood pressure is mildly elevated. We can consider switching atenolol to carvedilol. She reports abnormal creatinine when she was on a diuretic.  3. Peripheral arterial disease: Previous iliac stenting with no recurrent claudication. Pulses are normal. Continue aspirin.  4. Hyperlipidemia: Continue treatment with  atorvastatin with a target LDL of less than 70.  Disposition:   FU with me in 6 months  Signed,  Kathlyn Sacramento, MD  12/28/2016 4:50 PM    [facility]

## 2017-01-05 ENCOUNTER — Encounter
Admission: RE | Admit: 2017-01-05 | Discharge: 2017-01-05 | Disposition: A | Discharge status: Home / Self Care | Payer: Medicare Other | Source: Ambulatory Visit | Attending: Cardiovascular Disease | Admitting: Cardiovascular Disease

## 2017-01-05 DIAGNOSIS — I447 Left bundle-branch block, unspecified: Secondary | ICD-10-CM | POA: Insufficient documentation

## 2017-01-05 DIAGNOSIS — R079 Chest pain, unspecified: Secondary | ICD-10-CM | POA: Insufficient documentation

## 2017-01-05 LAB — NM MYOCAR MULTI W/SPECT W/WALL MOTION / EF
CHL CUP NUCLEAR SDS: 0
CHL CUP NUCLEAR SSS: 1
CHL CUP RESTING HR STRESS: 69 {beats}/min
CHL CUP STRESS STAGE 1 GRADE: 0 %
CHL CUP STRESS STAGE 1 SPEED: 0 mph
CHL CUP STRESS STAGE 2 GRADE: 0 %
CHL CUP STRESS STAGE 2 HR: 64 {beats}/min
CHL CUP STRESS STAGE 2 SPEED: 0 mph
CHL CUP STRESS STAGE 4 GRADE: 0 %
CHL CUP STRESS STAGE 4 SPEED: 0 mph
CHL CUP STRESS STAGE 5 DBP: 63 mmHg
CHL CUP STRESS STAGE 5 HR: 117 {beats}/min
CHL CUP STRESS STAGE 5 SBP: 142 mmHg
CSEPEDS: 0 s
CSEPEW: 1 METS
CSEPHR: 84 %
CSEPPHR: 123 {beats}/min
CSEPPMHR: 81 %
Exercise duration (min): 0 min
LV dias vol: 90 mL (ref 46–106)
LV sys vol: 25 mL
MPHR: 151 {beats}/min
NUC STRESS TID: 0.72
SRS: 11
Stage 1 HR: 64 {beats}/min
Stage 3 Grade: 0 %
Stage 3 HR: 123 {beats}/min
Stage 3 Speed: 0 mph
Stage 4 HR: 125 {beats}/min
Stage 5 Grade: 0 %
Stage 5 Speed: 0 mph

## 2017-01-05 MED ORDER — REGADENOSON 0.4 MG/5ML IV SOLN
0.4000 mg | Freq: Once | INTRAVENOUS | Status: AC
  Administered 2017-01-05: 0.4 mg via INTRAVENOUS

## 2017-01-05 MED ORDER — TECHNETIUM TC 99M TETROFOSMIN IV KIT
30.0000 | PACK | Freq: Once | INTRAVENOUS | Status: AC | PRN
  Administered 2017-01-05: 30.331 via INTRAVENOUS

## 2017-01-05 MED ORDER — TECHNETIUM TC 99M TETROFOSMIN IV KIT
13.0000 | PACK | Freq: Once | INTRAVENOUS | Status: AC | PRN
  Administered 2017-01-05: 12.438 via INTRAVENOUS

## 2017-01-08 ENCOUNTER — Encounter: Payer: Self-pay | Admitting: Family Medicine

## 2017-01-08 ENCOUNTER — Ambulatory Visit (INDEPENDENT_AMBULATORY_CARE_PROVIDER_SITE_OTHER): Payer: Medicare Other | Admitting: Family Medicine

## 2017-01-08 VITALS — BP 111/70 | HR 66 | Temp 99.3°F | Wt 147.0 lb

## 2017-01-08 DIAGNOSIS — B9789 Other viral agents as the cause of diseases classified elsewhere: Secondary | ICD-10-CM | POA: Diagnosis not present

## 2017-01-08 DIAGNOSIS — R6889 Other general symptoms and signs: Secondary | ICD-10-CM | POA: Diagnosis not present

## 2017-01-08 DIAGNOSIS — J069 Acute upper respiratory infection, unspecified: Secondary | ICD-10-CM

## 2017-01-08 LAB — VERITOR FLU A/B WAIVED
INFLUENZA A: NEGATIVE
Influenza B: NEGATIVE

## 2017-01-08 MED ORDER — OSELTAMIVIR PHOSPHATE 75 MG PO CAPS: 75.0000 mg | ORAL_CAPSULE | Freq: Two times a day (BID) | ORAL | 0 refills | Status: DC

## 2017-01-08 NOTE — Progress Notes (Signed)
BP 111/70   Pulse 66   Temp 99.3 F (37.4 C)   Wt 147 lb (66.7 kg)   SpO2 97%   BMI 29.69 kg/m    Subjective:    Patient ID: Marie Parker, female    DOB: [birth year], 71 y.o.   MRN: [id number]  HPI: Marie Parker is a 71 y.o. female  Chief Complaint  Patient presents with  . URI    x 2 days, head/chest congestion, fever of 102 over the weekend, achey, chills, non productive cough, right ear congested, sore throat.    Patient presents with 2 days of congestion, fever with tmax of 102, chills, body aches, cough, right ear pain, and sore throat. Woke up this morning with nausea and diarrhea as well. Denies CP, SOB, wheezing.. Taking mucinex, tylenol, and tussionex with minimal relief. No sick contacts.   Past Medical History:  Diagnosis Date  . Anxiety   . Arthritis   . CAD (coronary artery disease)   . Cancer (HCC)    squamous cell  . Chronic kidney disease   . Depression   . Generalized osteoarthritis   . GERD (gastroesophageal reflux disease)   . Gout   . History of endoscopy 11/2012   Gastritis  . Hyperlipidemia   . Hypertension   . IFG (impaired fasting glucose)   . Insomnia   . Menopause   . Osteoporosis   . Parathyroid adenoma   . Personal history of tobacco use, presenting hazards to health 02/02/2016  . PVD (peripheral vascular disease) ([place])   . PVD (peripheral vascular disease) ([place])   . Rotator cuff syndrome of shoulder and allied disorders   . Tobacco abuse    Social History   Social History  . Marital status: Married    Spouse name: N/A  . Number of children: N/A  . Years of education: N/A   Occupational History  . Not on file.   Social History Main Topics  . Smoking status: Former Smoker    Packs/day: 1.00    Years: 42.00    Types: Cigarettes    Quit date: 10/09/2001  . Smokeless tobacco: Never Used  . Alcohol use No  . Drug use: No  . Sexual activity: No   Other Topics Concern  . Not on file   Social History Narrative  . No  narrative on file   Relevant past medical, surgical, family and social history reviewed and updated as indicated. Interim medical history since our last visit reviewed. Allergies and medications reviewed and updated.  Review of Systems  Constitutional: Positive for chills, diaphoresis, fatigue and fever.  HENT: Positive for congestion and ear pain.   Respiratory: Positive for cough.   Cardiovascular: Negative.   Gastrointestinal: Positive for diarrhea and nausea.  Genitourinary: Negative.   Musculoskeletal: Positive for myalgias.  Skin: Negative.   Neurological: Negative.   Psychiatric/Behavioral: Negative.    Per HPI unless specifically indicated above     Objective:    BP 111/70   Pulse 66   Temp 99.3 F (37.4 C)   Wt 147 lb (66.7 kg)   SpO2 97%   BMI 29.69 kg/m   Wt Readings from Last 3 Encounters:  01/08/17 147 lb (66.7 kg)  12/28/16 148 lb 4 oz (67.2 kg)  12/21/16 146 lb 3.2 oz (66.3 kg)    Physical Exam  Constitutional: She is oriented to person, place, and time. She appears well-developed and well-nourished.  HENT:  Head: Atraumatic.  Right Ear: External ear normal.  Left Ear: External ear normal.  Nose: Nose normal.  Oropharynx mildly erythematous  Eyes: Conjunctivae are normal. Pupils are equal, round, and reactive to light.  Neck: Normal range of motion. Neck supple.  Cardiovascular: Normal rate and normal heart sounds.   Pulmonary/Chest: Effort normal and breath sounds normal. No respiratory distress.  Musculoskeletal: Normal range of motion.  Neurological: She is alert and oriented to person, place, and time.  Skin: Skin is warm and dry.  Psychiatric: She has a normal mood and affect. Her behavior is normal.  Nursing note and vitals reviewed.   Results for orders placed or performed during the hospital encounter of 01/05/17  NM Myocar Multi W/Spect W/Wall Motion / EF  Result Value Ref Range   Rest HR 69 bpm   Rest BP 149/69 mmHg   Exercise  duration (sec) 0 sec   Percent HR 84 %   Exercise duration (min) 0 min   Estimated workload 1.0 METS   Peak HR 123 BPM   Peak BP  mmHg   MPHR 151 bpm   SSS 1    SRS 11    SDS 0    TID 0.72    LV sys vol 25 mL   LV dias vol 90 46 - 106 mL   Phase 1 name PREINFSN    Stage 1 Name SUPINE    Stage 1 Time 00:00:03    Stage 1 Speed 0.0 mph   Stage 1 Grade 0.0 %   Stage 1 HR 64 bpm   Phase 2 Name Infusion    Stage 2 Name DOSE 1    Stage 2 Attribute Baseline    Stage 2 Time 00:00:01    Stage 2 Speed 0.0 mph   Stage 2 Grade 0.0 %   Stage 2 HR 64 bpm   Phase 3 Name Infusion    Stage 3 Name DOSE 1    Stage 3 Attribute Peak    Stage 3 Time 00:01:01    Stage 3 Speed 0.0 mph   Stage 3 Grade 0.0 %   Stage 3 HR 123 bpm   Phase 4 Name POSTINFSN    Stage 4 Attribute Recovery 39min    Stage 4 Time 00:01:00    Stage 4 Speed 0.0 mph   Stage 4 Grade 0.0 %   Stage 4 HR 125 bpm   Phase 5 Name POSTINFSN    Stage 5 Time 00:04:11    Stage 5 Speed 0.0 mph   Stage 5 Grade 0.0 %   Stage 5 HR 117 bpm   Stage 5 SBP 142 mmHg   Stage 5 DBP 63 mmHg   Percent of predicted max HR 81 %      Assessment & Plan:   Problem List Items Addressed This Visit    None    Visit Diagnoses    Viral URI with cough    -  Primary   Rapid flu neg, but sxs susicious. Will send tamiflu for her to start. Continue tylenol, push fluids, imodium prn, BRAT diet. F/u if worsening or no improvement   Relevant Medications   oseltamivir (TAMIFLU) 75 MG capsule   Other Relevant Orders   Influenza A & B (STAT)       Follow up plan: Return if symptoms worsen or fail to improve.

## 2017-01-08 NOTE — Patient Instructions (Signed)
Follow up as needed

## 2017-01-16 ENCOUNTER — Telehealth: Payer: Self-pay | Admitting: Family Medicine

## 2017-01-16 MED ORDER — AMOXICILLIN-POT CLAVULANATE 875-125 MG PO TABS: 1.0000 | ORAL_TABLET | Freq: Two times a day (BID) | ORAL | 0 refills | Status: DC

## 2017-01-16 NOTE — Telephone Encounter (Signed)
Pt called and stated she is much better but she still has a cough and now she has yellowish green phlegm and she would like to know if she needs to come back in or if something could be sent in to [facility].

## 2017-01-16 NOTE — Telephone Encounter (Signed)
Routing to provider  

## 2017-01-16 NOTE — Telephone Encounter (Signed)
Augmentin sent to pharmacy. Will need to come in for f/u if no improvement with this.

## 2017-01-16 NOTE — Telephone Encounter (Signed)
Patient notified

## 2017-01-30 ENCOUNTER — Telehealth: Payer: Self-pay | Admitting: *Deleted

## 2017-01-30 NOTE — Telephone Encounter (Signed)
Left message for patient to notify them that it is time to schedule annual low dose lung cancer screening CT scan. Instructed patient to call back to verify information prior to the scan being scheduled.  

## 2017-02-12 ENCOUNTER — Ambulatory Visit
Admission: RE | Admit: 2017-02-12 | Discharge: 2017-02-12 | Disposition: A | Discharge status: Home / Self Care | Payer: Medicare Other | Source: Ambulatory Visit | Attending: Family Medicine | Admitting: Family Medicine

## 2017-02-12 DIAGNOSIS — Z1231 Encounter for screening mammogram for malignant neoplasm of breast: Secondary | ICD-10-CM | POA: Diagnosis not present

## 2017-02-12 DIAGNOSIS — Z1239 Encounter for other screening for malignant neoplasm of breast: Secondary | ICD-10-CM

## 2017-02-13 ENCOUNTER — Telehealth: Payer: Self-pay | Admitting: *Deleted

## 2017-02-13 NOTE — Telephone Encounter (Signed)
Left message for patient to notify them that it is time to schedule annual low dose lung cancer screening CT scan. Instructed patient to call back to verify information prior to the scan being scheduled.  

## 2017-02-14 ENCOUNTER — Telehealth: Payer: Self-pay | Admitting: Family Medicine

## 2017-02-14 ENCOUNTER — Telehealth: Payer: Self-pay | Admitting: *Deleted

## 2017-02-14 ENCOUNTER — Encounter: Payer: Self-pay | Admitting: *Deleted

## 2017-02-14 DIAGNOSIS — R918 Other nonspecific abnormal finding of lung field: Secondary | ICD-10-CM

## 2017-02-14 NOTE — Telephone Encounter (Signed)
-----   Message from Lieutenant Diego, RN sent at 02/14/2017  8:22 AM EDT ----- Regarding: follow up needed for lung screening scan Dr. Wynetta Emery, please disregard the letter that was entered in Epic in error. Ms. Dafoe is no longer eligible for lung screening due to a quit date >15 years ago. However, she does have what appear to be benign lung nodules that should have one final CT to document a 2 year stability. If you could order a noncontrast CT scan for lung nodule follow up it would be great. Please let me know if there are any questions about it or any difficulty getting it scheduled. Ms. Dudenhoeffer is aware as well.  Thanks, [employer]

## 2017-02-14 NOTE — Telephone Encounter (Signed)
Order placed

## 2017-02-14 NOTE — Telephone Encounter (Signed)
error 

## 2017-02-14 NOTE — Telephone Encounter (Signed)
Letter entered for today was not sent to patient. Discussed >15 years from smoking cessation making patient ineligible for screening scan. Message sent to PCP for follow up scan needed to document 2 year stability of known lung nodules.

## 2017-03-02 ENCOUNTER — Ambulatory Visit
Admission: RE | Admit: 2017-03-02 | Discharge: 2017-03-02 | Disposition: A | Discharge status: Home / Self Care | Payer: Medicare Other | Source: Ambulatory Visit | Attending: Family Medicine | Admitting: Family Medicine

## 2017-03-02 ENCOUNTER — Ambulatory Visit: Admission: RE | Admit: 2017-03-02 | Payer: Medicare Other | Source: Ambulatory Visit

## 2017-03-02 ENCOUNTER — Inpatient Hospital Stay: Admission: RE | Admit: 2017-03-02 | Payer: Medicare Other | Source: Ambulatory Visit

## 2017-03-02 DIAGNOSIS — R918 Other nonspecific abnormal finding of lung field: Secondary | ICD-10-CM | POA: Diagnosis not present

## 2017-03-02 DIAGNOSIS — I2510 Atherosclerotic heart disease of native coronary artery without angina pectoris: Secondary | ICD-10-CM | POA: Diagnosis not present

## 2017-03-02 DIAGNOSIS — R911 Solitary pulmonary nodule: Secondary | ICD-10-CM | POA: Insufficient documentation

## 2017-03-02 DIAGNOSIS — I700 Atherosclerosis of aorta: Secondary | ICD-10-CM | POA: Diagnosis not present

## 2017-03-05 ENCOUNTER — Telehealth: Payer: Self-pay | Admitting: Family Medicine

## 2017-03-05 NOTE — Telephone Encounter (Signed)
Please let her know that her CT showed no change in her nodule in her lung, so she doesn't need any more CTs. Thanks!

## 2017-03-06 NOTE — Telephone Encounter (Signed)
Patient notified

## 2017-03-13 ENCOUNTER — Other Ambulatory Visit: Payer: Medicare Other

## 2017-04-17 ENCOUNTER — Telehealth: Payer: Self-pay | Admitting: Family Medicine

## 2017-04-17 NOTE — Telephone Encounter (Signed)
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

## 2017-04-17 NOTE — Telephone Encounter (Signed)
Called and left patient a VM letting her know that her information was printed and ready to be picked up at the front desk.

## 2017-04-26 ENCOUNTER — Telehealth: Payer: Self-pay | Admitting: Family Medicine

## 2017-04-26 NOTE — Telephone Encounter (Signed)
Patient notified

## 2017-04-26 NOTE — Telephone Encounter (Signed)
There isn't really anything else

## 2017-04-26 NOTE — Telephone Encounter (Signed)
Spoke with patient clarified immunizations. Patient would like to know if there is something different that she can have for the Vaginal itching, the cream that she had last year is going to be 125.00  [facility] [place]

## 2017-06-26 ENCOUNTER — Encounter: Payer: Self-pay | Admitting: Family Medicine

## 2017-06-26 ENCOUNTER — Ambulatory Visit (INDEPENDENT_AMBULATORY_CARE_PROVIDER_SITE_OTHER): Payer: Medicare Other | Admitting: Family Medicine

## 2017-06-26 VITALS — BP 128/72 | HR 66 | Wt 145.2 lb

## 2017-06-26 DIAGNOSIS — K219 Gastro-esophageal reflux disease without esophagitis: Secondary | ICD-10-CM | POA: Diagnosis not present

## 2017-06-26 DIAGNOSIS — E782 Mixed hyperlipidemia: Secondary | ICD-10-CM | POA: Diagnosis not present

## 2017-06-26 DIAGNOSIS — I129 Hypertensive chronic kidney disease with stage 1 through stage 4 chronic kidney disease, or unspecified chronic kidney disease: Secondary | ICD-10-CM | POA: Diagnosis not present

## 2017-06-26 DIAGNOSIS — F419 Anxiety disorder, unspecified: Secondary | ICD-10-CM | POA: Diagnosis not present

## 2017-06-26 DIAGNOSIS — M1A9XX0 Chronic gout, unspecified, without tophus (tophi): Secondary | ICD-10-CM

## 2017-06-26 MED ORDER — CILIDINIUM-CHLORDIAZEPOXIDE 2.5-5 MG PO CAPS: ORAL_CAPSULE | ORAL | 1 refills | Status: DC

## 2017-06-26 MED ORDER — BENAZEPRIL HCL 40 MG PO TABS: 40.0000 mg | ORAL_TABLET | Freq: Every day | ORAL | 1 refills | Status: DC

## 2017-06-26 MED ORDER — AMLODIPINE BESYLATE 10 MG PO TABS: 10.0000 mg | ORAL_TABLET | Freq: Every day | ORAL | 1 refills | Status: DC

## 2017-06-26 MED ORDER — ATENOLOL 50 MG PO TABS: 50.0000 mg | ORAL_TABLET | Freq: Every day | ORAL | 1 refills | Status: DC

## 2017-06-26 MED ORDER — HYDRALAZINE HCL 25 MG PO TABS: 25.0000 mg | ORAL_TABLET | Freq: Two times a day (BID) | ORAL | 1 refills | Status: DC

## 2017-06-26 MED ORDER — ATORVASTATIN CALCIUM 40 MG PO TABS: 40.0000 mg | ORAL_TABLET | Freq: Every day | ORAL | 1 refills | Status: DC

## 2017-06-26 MED ORDER — SERTRALINE HCL 50 MG PO TABS: 50.0000 mg | ORAL_TABLET | Freq: Every day | ORAL | 1 refills | Status: DC

## 2017-06-26 MED ORDER — PANTOPRAZOLE SODIUM 40 MG PO TBEC: 40.0000 mg | DELAYED_RELEASE_TABLET | Freq: Every day | ORAL | 1 refills | Status: DC

## 2017-06-26 MED ORDER — CLONAZEPAM 0.5 MG PO TABS: 0.5000 mg | ORAL_TABLET | Freq: Every evening | ORAL | 0 refills | Status: DC | PRN

## 2017-06-26 MED ORDER — SUCRALFATE 1 G PO TABS: 1.0000 g | ORAL_TABLET | Freq: Three times a day (TID) | ORAL | 2 refills | Status: DC

## 2017-06-26 MED ORDER — TRAMADOL HCL 50 MG PO TABS: 50.0000 mg | ORAL_TABLET | Freq: Three times a day (TID) | ORAL | 0 refills | Status: DC | PRN

## 2017-06-26 NOTE — Patient Instructions (Addendum)
Iliotibial Band Syndrome Rehab  Ask your health care provider which exercises are safe for you. Do exercises exactly as told by your health care provider and adjust them as directed. It is normal to feel mild stretching, pulling, tightness, or discomfort as you do these exercises, but you should stop right away if you feel sudden pain or your pain gets worse. Do not begin these exercises until told by your health care provider.  Stretching and range of motion exercises  These exercises warm up your muscles and joints and improve the movement and flexibility of your hip and pelvis.  Exercise A: Quadriceps, prone    1. Lie on your abdomen on a firm surface, such as a bed or padded floor.  2. Bend your left / right knee and hold your ankle. If you cannot reach your ankle or pant leg, loop a belt around your foot and grab the belt instead.  3. Gently pull your heel toward your buttocks. Your knee should not slide out to the side. You should feel a stretch in the front of your thigh and knee.  4. Hold this position for __________ seconds.  Repeat __________ times. Complete this stretch __________ times a day.  Exercise B: Iliotibial band    1. Lie on your side with your left / right leg in the top position.  2. Bend both of your knees and grab your left / right ankle. Stretch out your bottom arm to help you balance.  3. Slowly bring your top knee back so your thigh goes behind your trunk.  4. Slowly lower your top leg toward the floor until you feel a gentle stretch on the outside of your left / right hip and thigh. If you do not feel a stretch and your knee will not fall farther, place the heel of your other foot on top of your knee and pull your knee down toward the floor with your foot.  5. Hold this position for __________ seconds.  Repeat __________ times. Complete this stretch __________ times a day.  Strengthening exercises  These exercises build strength and endurance in your hip and pelvis. Endurance is the  ability to use your muscles for a long time, even after they get tired.  Exercise C: Straight leg raises (  hip abductors)  1. Lie on your side with your left / right leg in the top position. Lie so your head, shoulder, knee, and hip line up. You may bend your bottom knee to help you balance.  2. Roll your hips slightly forward so your hips are stacked directly over each other and your left / right knee is facing forward.  3. Tense the muscles in your outer thigh and lift your top leg 4-6 inches (10-15 cm).  4. Hold this position for __________ seconds.  5. Slowly return to the starting position. Let your muscles relax completely before doing another repetition.  Repeat __________ times. Complete this exercise __________ times a day.  Exercise D: Straight leg raises (  hip extensors)  1. Lie on your abdomen on your bed or a firm surface. You can put a pillow under your hips if that is more comfortable.  2. Bend your left / right knee so your foot is straight up in the air.  3. Squeeze your buttock muscles and lift your left / right thigh off the bed. Do not let your back arch.  4. Tense this muscle as hard as you can without increasing any knee pain.    5. Hold this position for __________ seconds.  6. Slowly lower your leg to the starting position and allow it to relax completely.  Repeat __________ times. Complete this exercise __________ times a day.  Exercise E: Hip hike  1. Stand sideways on a bottom step. Stand on your left / right leg with your other foot unsupported next to the step. You can hold onto the railing or wall if needed for balance.  2. Keep your knees straight and your torso square. Then, lift your left / right hip up toward the ceiling.  3. Slowly let your left / right hip lower toward the floor, past the starting position. Your foot should get closer to the floor. Do not lean or bend your knees.  Repeat __________ times. Complete this exercise __________ times a day.  This information is not  intended to replace advice given to you by your health care provider. Make sure you discuss any questions you have with your health care provider.  Document Released: 08/14/2005 Document Revised: 04/18/2016 Document Reviewed: 07/16/2015  Elsevier Interactive Patient Education © 2018 [employer]

## 2017-06-26 NOTE — Assessment & Plan Note (Signed)
Under good control. Continue current regimen. Continue to monitor. Call with any concerns. 

## 2017-06-26 NOTE — Assessment & Plan Note (Signed)
Doing OK, but still feeling irritable. Will increase her sertraline to 50mg  daily. Call with any concerns.

## 2017-06-26 NOTE — Assessment & Plan Note (Signed)
Stable on current regimen. Continue current regimen. Continue to monitor. Refills given today. Call with any concerns. 

## 2017-06-26 NOTE — Progress Notes (Signed)
BP 128/72 (BP Location: Left Arm, Cuff Size: Normal)   Pulse 66   Wt 145 lb 4 oz (65.9 kg)   SpO2 97%   BMI 29.34 kg/m    Subjective:    Patient ID: Marie Parker, female    DOB: 04/04/1946, 71 y.o.   MRN: [id number]  HPI: Marie Parker is a 71 y.o. female  Chief Complaint  Patient presents with  . Hypertension  . Hyperlipidemia  . Anxiety   HYPERTENSION / HYPERLIPIDEMIA Satisfied with current treatment? yes Duration of hypertension: chronic BP monitoring frequency: not checking BP medication side effects: no Past BP meds: benazepril, amlodipine, atenolol, hydralazine Duration of hyperlipidemia: chronic Cholesterol medication side effects: no Cholesterol supplements: none Past cholesterol medications: atorvastatin Medication compliance: excellent compliance Aspirin: yes Recent stressors: no Recurrent headaches: no Visual changes: no Palpitations: no Dyspnea: no Chest pain: no Lower extremity edema: no Dizzy/lightheaded: no  ANXIETY/STRESS Duration:stable Anxious mood: yes  Excessive worrying: yes Irritability: yes  Sweating: no Nausea: no Palpitations:no Hyperventilation: no Panic attacks: no Agoraphobia: no  Obscessions/compulsions: no Depressed mood: no Depression screen [facility] 2/9 06/26/2017 12/21/2016 04/18/2016 12/21/2015  Decreased Interest 0 0 0 0  Down, Depressed, Hopeless 0 0 0 0  PHQ - 2 Score 0 0 0 0  Altered sleeping - - 3 -  Tired, decreased energy - - 0 -  Change in appetite - - 0 -  Feeling bad or failure about yourself  - - 0 -  Trouble concentrating - - 0 -  Moving slowly or fidgety/restless - - 0 -  Suicidal thoughts - - 0 -  PHQ-9 Score - - 3 -  Difficult doing work/chores - - Not difficult at all -   Anhedonia: no Weight changes: no Insomnia: no   Hypersomnia: no Fatigue/loss of energy: no Feelings of worthlessness: no Feelings of guilt: no Impaired concentration/indecisiveness: no Suicidal ideations: no  Crying spells:  no Recent Stressors/Life Changes: yes   Relationship problems: yes   Family stress: yes     Financial stress: no    Job stress: no    Recent death/loss: yes  Relevant past medical, surgical, family and social history reviewed and updated as indicated. Interim medical history since our last visit reviewed. Allergies and medications reviewed and updated.  Review of Systems  Constitutional: Negative.   Respiratory: Negative.   Cardiovascular: Negative.   Musculoskeletal: Positive for arthralgias. Negative for back pain, gait problem, joint swelling, myalgias, neck pain and neck stiffness.  Psychiatric/Behavioral: Negative for agitation, behavioral problems, confusion, decreased concentration, dysphoric mood, hallucinations, self-injury, sleep disturbance and suicidal ideas. The patient is nervous/anxious. The patient is not hyperactive.     Per HPI unless specifically indicated above     Objective:    BP 128/72 (BP Location: Left Arm, Cuff Size: Normal)   Pulse 66   Wt 145 lb 4 oz (65.9 kg)   SpO2 97%   BMI 29.34 kg/m   Wt Readings from Last 3 Encounters:  06/26/17 145 lb 4 oz (65.9 kg)  01/08/17 147 lb (66.7 kg)  12/28/16 148 lb 4 oz (67.2 kg)    Physical Exam  Constitutional: She is oriented to person, place, and time. She appears well-developed and well-nourished. No distress.  HENT:  Head: Normocephalic and atraumatic.  Right Ear: Hearing normal.  Left Ear: Hearing normal.  Nose: Nose normal.  Eyes: Conjunctivae and lids are normal. Right eye exhibits no discharge. Left eye exhibits no discharge. No scleral icterus.  Cardiovascular: Normal rate,  regular rhythm, normal heart sounds and intact distal pulses.  Exam reveals no gallop and no friction rub.   No murmur heard. Pulmonary/Chest: Effort normal and breath sounds normal. No respiratory distress. She has no wheezes. She has no rales. She exhibits no tenderness.  Musculoskeletal: Normal range of motion.    Neurological: She is alert and oriented to person, place, and time.  Skin: Skin is warm, dry and intact. No rash noted. She is not diaphoretic. No erythema. No pallor.  Psychiatric: She has a normal mood and affect. Her speech is normal and behavior is normal. Judgment and thought content normal. Cognition and memory are normal.  Nursing note and vitals reviewed.   Results for orders placed or performed in visit on 01/08/17  Influenza A & B (STAT)  Result Value Ref Range   Influenza A Negative Negative   Influenza B Negative Negative      Assessment & Plan:   Problem List Items Addressed This Visit      Digestive   GERD (gastroesophageal reflux disease)    Stable on current regimen. Continue current regimen. Continue to monitor. Refills given today. Call with any concerns.       Relevant Medications   clidinium-chlordiazePOXIDE (LIBRAX) 5-2.5 MG capsule   pantoprazole (PROTONIX) 40 MG tablet   sucralfate (CARAFATE) 1 g tablet     Genitourinary   Benign hypertensive renal disease - Primary    Under good control on recheck. Continue current regimen. Continue to monitor. Call with any concerns.       Relevant Orders   Comprehensive metabolic panel     Other   Hyperlipidemia    Under good control. Continue current regimen. Continue to monitor. Call with any concerns.            Relevant Medications   amLODipine (NORVASC) 10 MG tablet   atenolol (TENORMIN) 50 MG tablet   atorvastatin (LIPITOR) 40 MG tablet   benazepril (LOTENSIN) 40 MG tablet   hydrALAZINE (APRESOLINE) 25 MG tablet   Other Relevant Orders   Comprehensive metabolic panel   Lipid Panel w/o Chol/HDL Ratio   Gout    Under good control. Continue current regimen. Continue to monitor. Call with any concerns.      Acute anxiety    Doing OK, but still feeling irritable. Will increase her sertraline to 50mg  daily. Call with any concerns.       Relevant Medications   sertraline (ZOLOFT) 50 MG tablet        Follow up plan: Return in about 6 months (around 12/25/2017) for Wellness.

## 2017-06-26 NOTE — Assessment & Plan Note (Signed)
Under good control on recheck. Continue current regimen. Continue to monitor. Call with any concerns.  

## 2017-06-27 LAB — COMPREHENSIVE METABOLIC PANEL
ALBUMIN: 4.5 g/dL (ref 3.6–4.8)
ALK PHOS: 79 IU/L (ref 39–117)
ALT: 15 IU/L (ref 0–32)
AST: 19 IU/L (ref 0–40)
Albumin/Globulin Ratio: 2 (ref 1.2–2.2)
BILIRUBIN TOTAL: 0.4 mg/dL (ref 0.0–1.2)
BUN / CREAT RATIO: 11 — AB (ref 12–28)
BUN: 9 mg/dL (ref 8–27)
CHLORIDE: 102 mmol/L (ref 96–106)
CO2: 26 mmol/L (ref 20–29)
Calcium: 9 mg/dL (ref 8.7–10.3)
Creatinine, Ser: 0.85 mg/dL (ref 0.57–1.00)
GFR calc non Af Amer: 70 mL/min/{1.73_m2} (ref >59)
GFR, EST AFRICAN AMERICAN: 81 mL/min/{1.73_m2} (ref >59)
GLUCOSE: 102 mg/dL — AB (ref 65–99)
Globulin, Total: 2.3 g/dL (ref 1.5–4.5)
Potassium: 3.7 mmol/L (ref 3.5–5.2)
Sodium: 145 mmol/L — ABNORMAL HIGH (ref 134–144)
Total Protein: 6.8 g/dL (ref 6.0–8.5)

## 2017-06-27 LAB — LIPID PANEL W/O CHOL/HDL RATIO
Cholesterol, Total: 127 mg/dL (ref 100–199)
HDL: 51 mg/dL (ref >39)
LDL Calculated: 55 mg/dL (ref 0–99)
Triglycerides: 104 mg/dL (ref 0–149)
VLDL CHOLESTEROL CAL: 21 mg/dL (ref 5–40)

## 2017-07-10 ENCOUNTER — Other Ambulatory Visit: Payer: Self-pay | Admitting: Family Medicine

## 2017-07-10 ENCOUNTER — Ambulatory Visit: Payer: Medicare Other | Admitting: Family Medicine

## 2017-07-10 ENCOUNTER — Encounter: Payer: Self-pay | Admitting: Family Medicine

## 2017-07-10 VITALS — BP 142/64 | HR 66 | Temp 98.5°F | Wt 145.6 lb

## 2017-07-10 DIAGNOSIS — R066 Hiccough: Secondary | ICD-10-CM

## 2017-07-10 MED ORDER — TIZANIDINE HCL 4 MG PO TABS: 4.0000 mg | ORAL_TABLET | Freq: Four times a day (QID) | ORAL | 0 refills | Status: DC | PRN

## 2017-07-10 NOTE — Patient Instructions (Signed)
[address] [place], [place] [address] Telephone Number: [phone] Email Our Department: [email or web address]
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
## 2017-07-14 ENCOUNTER — Other Ambulatory Visit: Payer: Self-pay | Admitting: Family Medicine

## 2017-07-16 NOTE — Telephone Encounter (Signed)
See message from [facility] do I need to call the patient to schedule appointment

## 2017-07-16 NOTE — Telephone Encounter (Signed)
Spoke with the patient. She is feeling much better no appointment necessary  Just FYI

## 2017-07-16 NOTE — Telephone Encounter (Signed)
Please let her know if she's not feeling better she should be seen.

## 2017-07-16 NOTE — Telephone Encounter (Signed)
Md's note states that pt should come back to be seen if no improvement; does she need to be seen in office before any refills can be made?

## 2017-07-20 ENCOUNTER — Other Ambulatory Visit: Payer: Self-pay | Admitting: Family Medicine

## 2017-07-24 ENCOUNTER — Ambulatory Visit (INDEPENDENT_AMBULATORY_CARE_PROVIDER_SITE_OTHER): Payer: Medicare Other | Admitting: Family Medicine

## 2017-07-24 ENCOUNTER — Encounter: Payer: Self-pay | Admitting: Family Medicine

## 2017-07-24 VITALS — BP 134/75 | HR 60 | Temp 98.6°F | Wt 147.7 lb

## 2017-07-24 DIAGNOSIS — M7989 Other specified soft tissue disorders: Secondary | ICD-10-CM | POA: Diagnosis not present

## 2017-07-24 MED ORDER — DICLOFENAC SODIUM 1 % TD GEL: 2.0000 g | Freq: Four times a day (QID) | TRANSDERMAL | 2 refills | Status: DC

## 2017-07-24 MED ORDER — PREDNISONE 10 MG PO TABS: ORAL_TABLET | ORAL | 0 refills | Status: DC

## 2017-07-24 MED ORDER — TRAMADOL HCL 50 MG PO TABS: 50.0000 mg | ORAL_TABLET | Freq: Three times a day (TID) | ORAL | 0 refills | Status: DC | PRN

## 2017-07-24 NOTE — Progress Notes (Signed)
BP 134/75 (BP Location: Right Arm, Patient Position: Sitting, Cuff Size: Normal)   Pulse 60   Temp 98.6 F (37 C) (Oral)   Wt 147 lb 11.2 oz (67 kg)   SpO2 97%   BMI 29.83 kg/m    Subjective:    Patient ID: Marie Parker, female    DOB: 09-Jul-1946, 71 y.o.   MRN: [id number]  HPI: Celestina Gironda is a 71 y.o. female  Chief Complaint  Patient presents with  . Hand Pain    Both hands. Swelling and painful. x's 1 week.    Patient presents today with b/l hand pain, swelling, warmth, and redness worsening the past week. Started in just the left but is now both. No injury or recent changes of any kind, fevers, body aches, sweats. Has not tried anything OTC. Has had similar flares in other joints in the past. Thinks she was checked for Rheumatoid arthritis in the past and that it was negative. 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   Relevant past medical, surgical, family and social history reviewed and updated as indicated. Interim medical history since our last visit reviewed. Allergies and medications reviewed and updated.  Review of Systems  Constitutional: Negative.   Respiratory: Negative.   Cardiovascular: Negative.   Gastrointestinal: Negative.   Musculoskeletal: Positive for arthralgias and joint swelling.  Neurological: Negative.   Psychiatric/Behavioral: Negative.    Per HPI unless specifically indicated above     Objective:    BP 134/75 (BP Location: Right Arm, Patient Position: Sitting, Cuff Size: Normal)   Pulse 60   Temp 98.6 F (37 C) (Oral)   Wt 147 lb 11.2 oz (67 kg)   SpO2 97%   BMI 29.83 kg/m   Wt Readings from Last 3 Encounters:  07/24/17 147 lb 11.2 oz (67 kg)  07/10/17 145 lb 9.6 oz (66 kg)  06/26/17 145 lb 4 oz (65.9 kg)    Physical Exam  Constitutional: She is oriented to person, place, and time. She appears well-developed and well-nourished. No  distress.  HENT:  Head: Atraumatic.  Eyes: Conjunctivae are normal. Pupils are equal, round, and reactive to light.  Neck: Normal range of motion. Neck supple.  Cardiovascular: Normal rate and normal heart sounds.  Pulmonary/Chest: Effort normal and breath sounds normal.  Abdominal: Soft. Bowel sounds are normal.  Musculoskeletal: She exhibits edema (B/l hands and wrists edematous, mildly erythematous, and warm ) and tenderness (b/l hands and wrists).  ROM mostly intact but stiff from edema Multiple DIP deformities of hands  Neurological: She is alert and oriented to person, place, and time.  Skin: Skin is warm and dry.  Psychiatric: She has a normal mood and affect. Her behavior is normal.  Nursing note and vitals reviewed.  Results for orders placed or performed in visit on 07/24/17  ANA w/Reflex  Result Value Ref Range   Anit Nuclear Antibody(ANA) Negative Negative  Rheumatoid Factor  Result Value Ref Range   Rhuematoid fact SerPl-aCnc <10.0 0.0 - 13.9 IU/mL  Sed Rate (ESR)  Result Value Ref Range   Sed Rate 27 0 - 40 mm/hr      Assessment & Plan:   Problem List Items Addressed This Visit    None    Visit Diagnoses    Swelling of both hands    -  Primary   Inflammatory arthritis, will recheck some autoimmune labs, sed  rate. Prednisone, diclofenac gel, tramadol prn for pain, epsom salt soaks, gentle exercises   Relevant Orders   ANA w/Reflex (Completed)   Rheumatoid Factor (Completed)   Sed Rate (ESR) (Completed)       Follow up plan: Return for as scheduled.

## 2017-07-25 LAB — ANA W/REFLEX: ANA: NEGATIVE

## 2017-07-25 LAB — RHEUMATOID FACTOR: Rhuematoid fact SerPl-aCnc: <10 IU/mL (ref 0.0–13.9)

## 2017-07-25 LAB — SEDIMENTATION RATE: SED RATE: 27 mm/h (ref 0–40)

## 2017-07-25 NOTE — Patient Instructions (Signed)
Follow up as needed

## 2017-08-29 ENCOUNTER — Encounter: Payer: Self-pay | Admitting: Family Medicine

## 2017-08-29 ENCOUNTER — Ambulatory Visit: Payer: Medicare Other | Admitting: Family Medicine

## 2017-08-29 VITALS — BP 149/81 | HR 79 | Temp 100.1°F | Wt 146.0 lb

## 2017-08-29 DIAGNOSIS — R509 Fever, unspecified: Secondary | ICD-10-CM

## 2017-08-29 DIAGNOSIS — J069 Acute upper respiratory infection, unspecified: Secondary | ICD-10-CM

## 2017-08-29 DIAGNOSIS — R52 Pain, unspecified: Secondary | ICD-10-CM

## 2017-08-29 DIAGNOSIS — J029 Acute pharyngitis, unspecified: Secondary | ICD-10-CM | POA: Diagnosis not present

## 2017-08-29 LAB — VERITOR FLU A/B WAIVED
INFLUENZA A: NEGATIVE
INFLUENZA B: NEGATIVE

## 2017-08-29 MED ORDER — AMOXICILLIN-POT CLAVULANATE 875-125 MG PO TABS: 1.0000 | ORAL_TABLET | Freq: Two times a day (BID) | ORAL | 0 refills | Status: DC

## 2017-08-29 MED ORDER — GUAIFENESIN ER 600 MG PO TB12: 600.0000 mg | ORAL_TABLET | Freq: Two times a day (BID) | ORAL | 0 refills | Status: DC

## 2017-08-29 MED ORDER — HYDROCOD POLST-CPM POLST ER 10-8 MG/5ML PO SUER: 5.0000 mL | Freq: Two times a day (BID) | ORAL | 0 refills | Status: DC | PRN

## 2017-08-29 NOTE — Progress Notes (Signed)
BP (!) 149/81   Pulse 79   Temp 100.1 F (37.8 C) (Oral)   Wt 146 lb (66.2 kg)   SpO2 97%   BMI 29.49 kg/m    Subjective:    Patient ID: Marie Parker, female    DOB: [birth year], 72 y.o.   MRN: [id number]  HPI: Marie Parker is a 72 y.o. female  Chief Complaint  Patient presents with  . Sore Throat    x 3 weeks, worsening.    Sore throat, congestion, cough, and significant ear pain on right x 3 weeks. Started with fever, chills, aches yesterday. Taking delsym and tussionex as well as saline drops with mild temporary relief. Several sick contacts.  Relevant past medical, surgical, family and social history reviewed and updated as indicated. Interim medical history since our last visit reviewed. Allergies and medications reviewed and updated.  Review of Systems  Constitutional: Positive for chills, diaphoresis and fatigue.  HENT: Positive for congestion, ear pain and sore throat.   Eyes: Negative.   Respiratory: Positive for cough.   Cardiovascular: Negative.   Gastrointestinal: Negative.   Musculoskeletal: Positive for myalgias.  Neurological: Negative.   Psychiatric/Behavioral: Negative.    Per HPI unless specifically indicated above     Objective:    BP (!) 149/81   Pulse 79   Temp 100.1 F (37.8 C) (Oral)   Wt 146 lb (66.2 kg)   SpO2 97%   BMI 29.49 kg/m   Wt Readings from Last 3 Encounters:  08/29/17 146 lb (66.2 kg)  07/24/17 147 lb 11.2 oz (67 kg)  07/10/17 145 lb 9.6 oz (66 kg)    Physical Exam  Constitutional: She is oriented to person, place, and time. She appears well-developed and well-nourished. No distress.  HENT:  Head: Atraumatic.  B/l TMs injected, effusion present Nasal mucosa and oropharynx erythematous and mildly edematous No exudates noted in oropharynx  Eyes: Conjunctivae are normal. Pupils are equal, round, and reactive to light. No scleral icterus.  Neck: Normal range of motion. Neck supple.  Cardiovascular: Normal rate and  normal heart sounds.  Pulmonary/Chest: Effort normal and breath sounds normal. No respiratory distress. She has no wheezes. She has no rales.  Musculoskeletal: Normal range of motion.  Lymphadenopathy:    She has no cervical adenopathy.  Neurological: She is alert and oriented to person, place, and time.  Skin: Skin is warm and dry.  Psychiatric: She has a normal mood and affect. Her behavior is normal.  Nursing note and vitals reviewed.  Results for orders placed or performed in visit on 08/29/17  Rapid Strep screen  Result Value Ref Range   Strep Gp A Ag, IA W/Reflex Negative Negative  Culture, Group A Strep  Result Value Ref Range   Strep A Culture Comment   Veritor Flu A/B Waived  Result Value Ref Range   Influenza A Negative Negative   Influenza B Negative Negative      Assessment & Plan:   Problem List Items Addressed This Visit    None    Visit Diagnoses    Upper respiratory tract infection, unspecified type    -  Primary   Rapid flu and strep neg, await strep cx. Will start augmentin and continue tussionex given severity and duration. Supportive care reviewed. F/u as needed   Relevant Orders   Rapid Strep screen (Completed)   Fever, unspecified fever cause       Relevant Orders   Rapid Strep screen (Completed)   Veritor Flu  A/B Waived (Completed)   Body aches       Relevant Orders   POCT Influenza A/B   Veritor Flu A/B Waived (Completed)       Follow up plan: Return if symptoms worsen or fail to improve.

## 2017-08-31 ENCOUNTER — Telehealth: Payer: Self-pay | Admitting: Family Medicine

## 2017-08-31 LAB — POCT INFLUENZA A/B
INFLUENZA A, POC: NEGATIVE
INFLUENZA B, POC: NEGATIVE

## 2017-08-31 LAB — CULTURE, GROUP A STREP: Strep A Culture: NEGATIVE

## 2017-08-31 LAB — RAPID STREP SCREEN (MED CTR MEBANE ONLY): Strep Gp A Ag, IA W/Reflex: NEGATIVE

## 2017-08-31 MED ORDER — PREDNISONE 10 MG PO TABS: ORAL_TABLET | ORAL | 0 refills | Status: DC

## 2017-08-31 NOTE — Telephone Encounter (Signed)
Routing to provider. Marie Schneiders do you know anything else we can do for patient?

## 2017-08-31 NOTE — Telephone Encounter (Signed)
Patient notified

## 2017-08-31 NOTE — Telephone Encounter (Signed)
I'll send over some more prednisone for her but she's already on an antibiotic so hopefully things will start improving over the next few days

## 2017-08-31 NOTE — Patient Instructions (Signed)
Follow up as needed

## 2017-08-31 NOTE — Telephone Encounter (Signed)
Copied from [place]. Topic: Quick Communication - See Telephone Encounter >> Aug 31, 2017  8:11 AM Robina Ade, Helene Kelp D wrote: CRM for notification. See Telephone encounter for: 08/31/17. Patient wanted to let Apolonio Schneiders know that she still is sick. Still has ear pain, she vomite last night 1X. She would like to talk to her or her CMA about this. Please call patient back, thanks.

## 2017-09-11 DIAGNOSIS — H2513 Age-related nuclear cataract, bilateral: Secondary | ICD-10-CM | POA: Diagnosis not present

## 2017-09-12 ENCOUNTER — Telehealth: Payer: Self-pay | Admitting: Family Medicine

## 2017-09-12 NOTE — Telephone Encounter (Signed)
Copied from [place] [id number]. Topic: Quick Communication - Rx Refill/Question >> Sep 12, 2017  2:07 PM Oliver Pila B wrote: Medication: chlorpheniramine-HYDROcodone ([place] [facility]) 10-8 MG/5ML Latanya Presser [[id number]]  Pt states still has a lingering cough and is horrible @ night and was wanting a Rx for @ night help, contact pt to advise

## 2017-09-13 NOTE — Telephone Encounter (Signed)
No refills on tussionex without being seen.

## 2017-09-13 NOTE — Telephone Encounter (Signed)
Patient scheduled for 07/05/18  °

## 2017-09-14 ENCOUNTER — Encounter: Payer: Self-pay | Admitting: Family Medicine

## 2017-09-14 ENCOUNTER — Ambulatory Visit (INDEPENDENT_AMBULATORY_CARE_PROVIDER_SITE_OTHER): Payer: Medicare Other | Admitting: Family Medicine

## 2017-09-14 VITALS — BP 135/82 | HR 68 | Wt 144.2 lb

## 2017-09-14 DIAGNOSIS — R05 Cough: Secondary | ICD-10-CM | POA: Diagnosis not present

## 2017-09-14 DIAGNOSIS — B372 Candidiasis of skin and nail: Secondary | ICD-10-CM | POA: Diagnosis not present

## 2017-09-14 DIAGNOSIS — R059 Cough, unspecified: Secondary | ICD-10-CM

## 2017-09-14 MED ORDER — PREDNISONE 50 MG PO TABS: 50.0000 mg | ORAL_TABLET | Freq: Every day | ORAL | 0 refills | Status: DC

## 2017-09-14 MED ORDER — HYDROCOD POLST-CPM POLST ER 10-8 MG/5ML PO SUER: 5.0000 mL | Freq: Every evening | ORAL | 0 refills | Status: DC | PRN

## 2017-09-14 MED ORDER — NYSTATIN 100000 UNIT/GM EX OINT: 1.0000 "application " | TOPICAL_OINTMENT | Freq: Two times a day (BID) | CUTANEOUS | 0 refills | Status: DC

## 2017-09-14 NOTE — Progress Notes (Signed)
BP 135/82 (BP Location: Left Arm, Patient Position: Sitting, Cuff Size: Normal)   Pulse 68   Wt 144 lb 4 oz (65.4 kg)   SpO2 97%   BMI 29.13 kg/m    Subjective:    Patient ID: Marie Parker, female    DOB: 1946-06-15, 72 y.o.   MRN: [id number]  HPI: Marie Parker is a 72 y.o. female  Chief Complaint  Patient presents with  . Cough  . Rash    inner left leg   UPPER RESPIRATORY TRACT INFECTION Duration: 1 month Worst symptom: cough Fever: no Cough: yes Shortness of breath: no Wheezing: no Chest pain: no Chest tightness: yes Chest congestion: yes Nasal congestion: yes Runny nose: yes Post nasal drip: yes Sneezing: no Sore throat: yes Swollen glands: no Sinus pressure: no Headache: no Face pain: no Toothache: no Ear pain: no  Ear pressure: no  Eyes red/itching:no Eye drainage/crusting: no  Vomiting: no Rash: no Fatigue: no Sick contacts: no Strep contacts: no  Context: better Recurrent sinusitis: no Relief with OTC cold/cough medications: no  Treatments attempted: zyrtec, prednisone, mucinex and antibiotics, delsym, niquil   RASH Duration:  Several months  Location: groin  Itching: yes Burning: yes Redness: yes Oozing: no Scaling: no Blisters: no Painful: no Fevers: no Change in detergents/soaps/personal care products: no Recent illness: yes Recent travel:no History of same: no Context: stable Alleviating factors: hydrocortisone cream and lotion/moisturizer Treatments attempted:hydrocortisone cream, benadryl, OTC anit-fungal and lotion/moisturizer Shortness of breath: no  Throat/tongue swelling: no Myalgias/arthralgias: no  Relevant past medical, surgical, family and social history reviewed and updated as indicated. Interim medical history since our last visit reviewed. Allergies and medications reviewed and updated.  Review of Systems  Constitutional: Negative.   Respiratory: Positive for cough. Negative for apnea, choking, chest  tightness, shortness of breath, wheezing and stridor.   Cardiovascular: Negative.   Skin: Positive for rash. Negative for color change, pallor and wound.  Psychiatric/Behavioral: Negative.     Per HPI unless specifically indicated above     Objective:    BP 135/82 (BP Location: Left Arm, Patient Position: Sitting, Cuff Size: Normal)   Pulse 68   Wt 144 lb 4 oz (65.4 kg)   SpO2 97%   BMI 29.13 kg/m   Wt Readings from Last 3 Encounters:  09/14/17 144 lb 4 oz (65.4 kg)  08/29/17 146 lb (66.2 kg)  07/24/17 147 lb 11.2 oz (67 kg)    Physical Exam  Constitutional: She is oriented to person, place, and time. She appears well-developed and well-nourished. No distress.  HENT:  Head: Normocephalic and atraumatic.  Right Ear: Hearing and external ear normal.  Left Ear: Hearing and external ear normal.  Nose: Nose normal.  Mouth/Throat: Oropharynx is clear and moist. No oropharyngeal exudate.  Eyes: Conjunctivae, EOM and lids are normal. Pupils are equal, round, and reactive to light. Right eye exhibits no discharge. Left eye exhibits no discharge. No scleral icterus.  Neck: Normal range of motion. Neck supple. No JVD present. No tracheal deviation present. No thyromegaly present.  Cardiovascular: Normal rate, regular rhythm, normal heart sounds and intact distal pulses. Exam reveals no gallop and no friction rub.  No murmur heard. Pulmonary/Chest: Effort normal and breath sounds normal. No stridor. No respiratory distress. She has no wheezes. She has no rales. She exhibits no tenderness.  Musculoskeletal: Normal range of motion.  Lymphadenopathy:    She has no cervical adenopathy.  Neurological: She is alert and oriented to person, place, and time.  Skin: Skin is warm, dry and intact. No rash noted. She is not diaphoretic. No erythema. No pallor.  Psychiatric: She has a normal mood and affect. Her speech is normal and behavior is normal. Judgment and thought content normal. Cognition and  memory are normal.    Results for orders placed or performed in visit on 08/29/17  Rapid Strep screen  Result Value Ref Range   Strep Gp A Ag, IA W/Reflex Negative Negative  Culture, Group A Strep  Result Value Ref Range   Strep A Culture Negative   Veritor Flu A/B Waived  Result Value Ref Range   Influenza A Negative Negative   Influenza B Negative Negative  POCT Influenza A/B  Result Value Ref Range   Influenza A, POC Negative Negative   Influenza B, POC Negative Negative      Assessment & Plan:   Problem List Items Addressed This Visit    None    Visit Diagnoses    Cough    -  Primary   Will treat with prednisone and tussionex. Call with any concerns or if not getting better.    Candidal intertrigo       Will treat with nystatin. Call with any concerns.    Relevant Medications   nystatin ointment (MYCOSTATIN)       Follow up plan: Return if symptoms worsen or fail to improve.

## 2017-09-14 NOTE — Patient Instructions (Signed)
Echinacea Zinc Vitamin C Vitamin B12

## 2017-10-27 ENCOUNTER — Other Ambulatory Visit: Payer: Self-pay | Admitting: Family Medicine

## 2017-10-29 NOTE — Telephone Encounter (Signed)
LOV 06/26/17 with Dr. Wynetta Emery /  Disp Refills Start End   sertraline (ZOLOFT) 50 MG tablet 90 tablet 1 06/26/2017    Sig - Route: Take 1 tablet (50 mg total) by mouth daily. - Oral   Sent to pharmacy as: sertraline (ZOLOFT) 50 MG tablet   E-Prescribing Status: Receipt confirmed by pharmacy (06/26/2017 8:59 AM EDT)    Last prescription was written on 06/26/17 for 90 tablets with one refill / No refill is due at this time.

## 2017-11-24 ENCOUNTER — Other Ambulatory Visit: Payer: Self-pay | Admitting: Family Medicine

## 2017-12-10 DIAGNOSIS — L578 Other skin changes due to chronic exposure to nonionizing radiation: Secondary | ICD-10-CM | POA: Diagnosis not present

## 2017-12-10 DIAGNOSIS — Z859 Personal history of malignant neoplasm, unspecified: Secondary | ICD-10-CM | POA: Diagnosis not present

## 2017-12-10 DIAGNOSIS — Z872 Personal history of diseases of the skin and subcutaneous tissue: Secondary | ICD-10-CM | POA: Diagnosis not present

## 2017-12-24 ENCOUNTER — Ambulatory Visit (INDEPENDENT_AMBULATORY_CARE_PROVIDER_SITE_OTHER): Payer: Medicare Other

## 2017-12-24 VITALS — BP 140/70 | HR 62 | Temp 98.7°F | Resp 16 | Ht 59.0 in | Wt 145.7 lb

## 2017-12-24 DIAGNOSIS — Z1239 Encounter for other screening for malignant neoplasm of breast: Secondary | ICD-10-CM

## 2017-12-24 DIAGNOSIS — Z0000 Encounter for general adult medical examination without abnormal findings: Secondary | ICD-10-CM | POA: Diagnosis not present

## 2017-12-24 DIAGNOSIS — Z1231 Encounter for screening mammogram for malignant neoplasm of breast: Secondary | ICD-10-CM | POA: Diagnosis not present

## 2017-12-24 NOTE — Progress Notes (Signed)
Subjective:   Marie Parker is a 72 y.o. female who presents for Medicare Annual (Subsequent) preventive examination.  Review of Systems:   Cardiac Risk Factors include: hypertension;advanced age (>27men, >46 women);smoking/ tobacco exposure;dyslipidemia     Objective:     Vitals: BP 140/70 (BP Location: Left Arm, Patient Position: Sitting)   Pulse 62   Temp 98.7 F (37.1 C) (Temporal)   Resp 16   Ht 4\' 11"  (1.499 m)   Wt 145 lb 11.2 oz (66.1 kg)   SpO2 98%   BMI 29.43 kg/m   Body mass index is 29.43 kg/m.  Advanced Directives 12/24/2017 12/21/2016 12/21/2015  Does Patient Have a Medical Advance Directive? Yes Yes Yes  Type of Advance Directive Living will Living will Living will  Does patient want to make changes to medical advance directive? - No - Patient declined No - Patient declined  Copy of [place] in Chart? - - No - copy requested    Tobacco Social History   Tobacco Use  Smoking Status Former Smoker  . Packs/day: 1.00  . Years: 42.00  . Pack years: 42.00  . Types: Cigarettes  . Last attempt to quit: 08/27/2000  . Years since quitting: 17.3  Smokeless Tobacco Never Used     Counseling given: Not Answered   Clinical Intake:  Pre-visit preparation completed: Yes  Pain : No/denies pain     Nutritional Status: BMI 25 -29 Overweight Nutritional Risks: None Diabetes: No  How often do you need to have someone help you when you read instructions, pamphlets, or other written materials from your doctor or pharmacy?: 1 - Never What is the last grade level you completed in school?: 12th grade  Interpreter Needed?: No  Information entered by ::  ,LPN   Past Medical History:  Diagnosis Date  . Anxiety   . Arthritis   . CAD (coronary artery disease)   . Cancer (HCC)    squamous cell  . Chronic kidney disease   . Depression   . Generalized osteoarthritis   . GERD (gastroesophageal reflux disease)   . Gout   .  History of endoscopy 11/2012   Gastritis  . Hyperlipidemia   . Hypertension   . IFG (impaired fasting glucose)   . Insomnia   . Menopause   . Osteoporosis   . Parathyroid adenoma   . Personal history of tobacco use, presenting hazards to health 02/02/2016  . PVD (peripheral vascular disease) ([place])   . PVD (peripheral vascular disease) ([place])   . Rotator cuff syndrome of shoulder and allied disorders   . Tobacco abuse    Past Surgical History:  Procedure Laterality Date  . BREAST BIOPSY Right 2014   neg  . BREAST BIOPSY Left 1970's   neg bx/clip  . BREAST FIBROADENOMA SURGERY    . CARDIAC CATHETERIZATION  09/17/2007   Insignificant CAD with 40-50% stenosis of mid LAD.   [name] ILIAC ARTERY STENT     right iliac artery stent   . ORIF PATELLA Left 03/22/2016   Dr. Tristan Schroeder  . PARATHYROIDECTOMY    . SKIN BIOPSY     precancerous   Family History  Problem Relation Age of Onset  . Heart attack Father 46  . Alcohol abuse Father   . Cancer Father   . Heart disease Father   . Liver disease Father   . Hypertension Father   . Heart disease Sister 11       CAD; s/p stent placement  .  Stroke Sister   . AAA (abdominal aortic aneurysm) Sister   . Dementia Sister   . Stroke Mother   . Hypertension Mother   . Diabetes Mother   . Stroke Maternal Grandmother   . Heart attack Maternal Grandfather   . Stroke Paternal Grandmother   . Breast cancer Maternal Aunt   . Breast cancer Paternal Aunt    Social History   Socioeconomic History  . Marital status: Married    Spouse name: Not on file  . Number of children: Not on file  . Years of education: Not on file  . Highest education level: Not on file  Occupational History  . Not on file  Social Needs  . Financial resource strain: Not hard at all  . Food insecurity:    Worry: Never true    Inability: Never true  . Transportation needs:    Medical: No    Non-medical: No  Tobacco Use  . Smoking status: Former Smoker    Packs/day: 1.00     Years: 42.00    Pack years: 42.00    Types: Cigarettes    Last attempt to quit: 08/27/2000    Years since quitting: 17.3  . Smokeless tobacco: Never Used  Substance and Sexual Activity  . Alcohol use: No  . Drug use: No  . Sexual activity: Never  Lifestyle  . Physical activity:    Days per week: 0 days    Minutes per session: 0 min  . Stress: Not at all  Relationships  . Social connections:    Talks on phone: More than three times a week    Gets together: More than three times a week    Attends religious service: More than 4 times per year    Active member of club or organization: Yes    Attends meetings of clubs or organizations: More than 4 times per year    Relationship status: Married  Other Topics Concern  . Not on file  Social History Narrative   Working part time    Outpatient Encounter Medications as of 12/24/2017  Medication Sig  . acetaminophen (TYLENOL) 500 MG tablet Take 1,000 mg by mouth.  [name] amLODipine (NORVASC) 10 MG tablet Take 1 tablet (10 mg total) by mouth daily.  [name] aspirin 81 MG tablet Take 1 tablet (81 mg total) by mouth daily.  [name] atenolol (TENORMIN) 50 MG tablet Take 1 tablet (50 mg total) by mouth daily.  [name] atorvastatin (LIPITOR) 40 MG tablet Take 1 tablet (40 mg total) by mouth daily.  . benazepril (LOTENSIN) 40 MG tablet Take 1 tablet (40 mg total) by mouth daily.  . clidinium-chlordiazePOXIDE (LIBRAX) 5-2.5 MG capsule TAKE 1 CAPSULE BY MOUTH TWICE DAILY FOR LUNCH/SUPPER.  . clonazePAM (KLONOPIN) 0.5 MG tablet Take 1 tablet (0.5 mg total) by mouth at bedtime as needed.  . diclofenac sodium (VOLTAREN) 1 % GEL Apply 2 g topically 4 (four) times daily.  . diphenhydrAMINE (BENADRYL) 25 mg capsule Take 25 mg by mouth every 6 (six) hours as needed for sleep.  . fluticasone (FLONASE) 50 MCG/ACT nasal spray Place 2 sprays into both nostrils daily.  . hydrALAZINE (APRESOLINE) 25 MG tablet Take 1 tablet (25 mg total) by mouth 2 (two) times daily.  [name] NIACIN  PO Take 1,000 mg by mouth daily.  [name] nystatin ointment (MYCOSTATIN) Apply 1 application topically 2 (two) times daily.  . pantoprazole (PROTONIX) 40 MG tablet Take 1 tablet (40 mg total) by mouth daily.  . sertraline (  ZOLOFT) 50 MG tablet TAKE 1 TABLET BY MOUTH  DAILY  . traMADol (ULTRAM) 50 MG tablet Take 1 tablet (50 mg total) by mouth every 8 (eight) hours as needed.  . triamcinolone cream (KENALOG) 0.1 %   . TURMERIC PO Take by mouth.  . chlorpheniramine-HYDROcodone (TUSSIONEX PENNKINETIC ER) 10-8 MG/5ML SUER Take 5 mLs by mouth at bedtime as needed. (Patient not taking: Reported on 12/24/2017)  . clobetasol ointment (TEMOVATE) 0.05 % APPLY ONCE AT NIGHT FOR 6 TO 12 WEEKS. IF IMPROVEMENT DECREASE TO 2 TO 3 TIMES WEEKLY  . tiZANidine (ZANAFLEX) 4 MG tablet TAKE 1 TABLET(4 MG) BY MOUTH EVERY 6 HOURS AS NEEDED FOR MUSCLE SPASMS (Patient not taking: Reported on 07/24/2017)  . [DISCONTINUED] guaiFENesin (MUCINEX) 600 MG 12 hr tablet Take 1 tablet (600 mg total) by mouth 2 (two) times daily. (Patient not taking: Reported on 12/24/2017)  . [DISCONTINUED] predniSONE (DELTASONE) 50 MG tablet Take 1 tablet (50 mg total) by mouth daily with breakfast. (Patient not taking: Reported on 12/24/2017)  . [DISCONTINUED] sucralfate (CARAFATE) 1 g tablet Take 1 tablet (1 g total) by mouth 4 (four) times daily -  with meals and at bedtime.   No facility-administered encounter medications on file as of 12/24/2017.     Activities of Daily Living In your present state of health, do you have any difficulty performing the following activities: 12/24/2017  Hearing? N  Vision? N  Difficulty concentrating or making decisions? N  Walking or climbing stairs? N  Dressing or bathing? N  Doing errands, shopping? N  Preparing Food and eating ? N  Using the Toilet? N  In the past six months, have you accidently leaked urine? N  Do you have problems with loss of bowel control? N  Managing your Medications? N  Managing your  Finances? N  Housekeeping or managing your Housekeeping? N  Some recent data might be hidden    Patient Care Team: Valerie Roys, DO as PCP - General (Family Medicine) Jannet Mantis, MD (Dermatology) Dagoberto Ligas, MD as Referring Physician (Internal Medicine) Drue Dun, MD (Orthopedic Surgery)    Assessment:   This is a routine wellness examination for [place].  Exercise Activities and Dietary recommendations Current Exercise Habits: The patient does not participate in regular exercise at present, Exercise limited by: None identified  Goals    . DIET - INCREASE WATER INTAKE     Recommend drinking at least 6-8 glasses of water a day        Fall Risk Fall Risk  12/24/2017 08/29/2017 06/26/2017 12/21/2016 04/18/2016  Falls in the past year? No No No Yes Yes  Number falls in past yr: - - - 1 1  Injury with Fall? - - - Yes Yes  Risk for fall due to : - - - - History of fall(s)  Follow up - - - - Education provided   Is the patient's home free of loose throw rugs in walkways, pet beds, electrical cords, etc?   yes      Grab bars in the bathroom? no      Handrails on the stairs?   yes      Adequate lighting?   yes  Timed Get Up and Go performed: Completed in 8 seconds with no use of assistive devices, steady gait. No intervention needed at this time.   Depression Screen PHQ 2/9 Scores 12/24/2017 08/29/2017 06/26/2017 12/21/2016  PHQ - 2 Score 0 0 0 0  PHQ- 9 Score - - - -  Exception Documentation - - - -     Cognitive Function     6CIT Screen 12/24/2017 12/21/2016  What Year? 0 points 0 points  What month? 0 points 0 points  What time? 0 points 0 points  Count back from 20 0 points 0 points  Months in reverse 0 points 2 points  Repeat phrase 0 points 4 points  Total Score 0 6    Immunization History  Administered Date(s) Administered  . Influenza, High Dose Seasonal PF 06/21/2016  . Influenza,inj,Quad PF,6+ Mos 06/22/2015  . Influenza-Unspecified  06/02/2014, 05/07/2017  . Pneumococcal Conjugate-13 09/01/2014  . Pneumococcal Polysaccharide-23 12/11/2014  . Td 04/10/2005, 12/21/2015  . Tdap 04/30/2017  . Zoster 08/11/2008    Qualifies for Shingles Vaccine? No   Screening Tests Health Maintenance  Topic Date Due  . INFLUENZA VACCINE  03/28/2018  . Fecal DNA (Cologuard)  02/02/2019  . MAMMOGRAM  02/13/2019  . TETANUS/TDAP  05/01/2027  . DEXA SCAN  Completed  . PNA vac Low Risk Adult  Completed  . Hepatitis C Screening  Addressed    Cancer Screenings: Lung: Low Dose CT Chest recommended if Age 32-80 years, 30 pack-year currently smoking OR have quit w/in 15years. Patient does not qualify. Breast:  Up to date on Mammogram? Yes  02/12/2017 Up to date of Bone Density/Dexa? Yes 09/09/2014 Colorectal: cologuard done 02/02/2016  Additional Screenings:  Hepatitis C Screening: completed 12/11/2014     Plan:    I have personally reviewed and addressed the Medicare Annual Wellness questionnaire and have noted the following in the patient's chart:  A. Medical and social history B. Use of alcohol, tobacco or illicit drugs  C. Current medications and supplements D. Functional ability and status E.  Nutritional status F.  Physical activity G. Advance directives H. List of other physicians I.  Hospitalizations, surgeries, and ER visits in previous 12 months J.  [place] such as hearing and vision if needed, cognitive and depression L. Referrals and appointments   In addition, I have reviewed and discussed with patient certain preventive protocols, quality metrics, and best practice recommendations. A written personalized care plan for preventive services as well as general preventive health recommendations were provided to patient.   Signed,  Tyler Aas, LPN Nurse Health Advisor   Nurse Notes: none

## 2017-12-24 NOTE — Patient Instructions (Addendum)
Marie Parker , Thank you for taking time to come for your Medicare Wellness Visit. I appreciate your ongoing commitment to your health goals. Please review the following plan we discussed and let me know if I can assist you in the future.   Screening recommendations/referrals: Colonoscopy: completed 02/02/2016, due 01/2019 Mammogram: completed 02/12/2017 Please call [phone] to schedule your mammogram.  Bone Density: completed 09/09/2014 Recommended yearly ophthalmology/optometry visit for glaucoma screening and checkup Recommended yearly dental visit for hygiene and checkup  Vaccinations: Influenza vaccine: up to date  Pneumococcal vaccine: completed series Tdap vaccine: up to date Shingles vaccine: n/a  Advanced directives: Please bring a copy of your health care power of attorney and living will to the office at your convenience.  Conditions/risks identified: Recommend drinking at least 6-8 glasses of water a day   Next appointment: Follow up on 12/27/2017 at 8:00am with Dr.Johnson. Follow up in one year for your annual wellness exam.    Preventive Care 65 Years and Older, Female Preventive care refers to lifestyle choices and visits with your health care provider that can promote health and wellness. What does preventive care include?  A yearly physical exam. This is also called an annual well check.  Dental exams once or twice a year.  Routine eye exams. Ask your health care provider how often you should have your eyes checked.  Personal lifestyle choices, including:  Daily care of your teeth and gums.  Regular physical activity.  Eating a healthy diet.  Avoiding tobacco and drug use.  Limiting alcohol use.  Practicing safe sex.  Taking low-dose aspirin every day.  Taking vitamin and mineral supplements as recommended by your health care provider. What happens during an annual well check? The services and screenings done by your health care provider during your  annual well check will depend on your age, overall health, lifestyle risk factors, and family history of disease. Counseling  Your health care provider may ask you questions about your:  Alcohol use.  Tobacco use.  Drug use.  Emotional well-being.  Home and relationship well-being.  Sexual activity.  Eating habits.  History of falls.  Memory and ability to understand (cognition).  Work and work Statistician.  Reproductive health. Screening  You may have the following tests or measurements:  Height, weight, and BMI.  Blood pressure.  Lipid and cholesterol levels. These may be checked every 5 years, or more frequently if you are over 64 years old.  Skin check.  Lung cancer screening. You may have this screening every year starting at age 89 if you have a 30-pack-year history of smoking and currently smoke or have quit within the past 15 years.  Fecal occult blood test (FOBT) of the stool. You may have this test every year starting at age 74.  Flexible sigmoidoscopy or colonoscopy. You may have a sigmoidoscopy every 5 years or a colonoscopy every 10 years starting at age 52.  Hepatitis C blood test.  Hepatitis B blood test.  Sexually transmitted disease (STD) testing.  Diabetes screening. This is done by checking your blood sugar (glucose) after you have not eaten for a while (fasting). You may have this done every 1-3 years.  Bone density scan. This is done to screen for osteoporosis. You may have this done starting at age 1.  Mammogram. This may be done every 1-2 years. Talk to your health care provider about how often you should have regular mammograms. Talk with your health care provider about your test results, treatment  options, and if necessary, the need for more tests. Vaccines  Your health care provider may recommend certain vaccines, such as:  Influenza vaccine. This is recommended every year.  Tetanus, diphtheria, and acellular pertussis (Tdap, Td)  vaccine. You may need a Td booster every 10 years.  Zoster vaccine. You may need this after age 29.  Pneumococcal 13-valent conjugate (PCV13) vaccine. One dose is recommended after age 84.  Pneumococcal polysaccharide (PPSV23) vaccine. One dose is recommended after age 23. Talk to your health care provider about which screenings and vaccines you need and how often you need them. This information is not intended to replace advice given to you by your health care provider. Make sure you discuss any questions you have with your health care provider. Document Released: 09/10/2015 Document Revised: 05/03/2016 Document Reviewed: 06/15/2015 Elsevier Interactive Patient Education  2017 [place] Prevention in the Home Falls can cause injuries. They can happen to people of all ages. There are many things you can do to make your home safe and to help prevent falls. What can I do on the outside of my home?  Regularly fix the edges of walkways and driveways and fix any cracks.  Remove anything that might make you trip as you walk through a door, such as a raised step or threshold.  Trim any bushes or trees on the path to your home.  Use bright outdoor lighting.  Clear any walking paths of anything that might make someone trip, such as rocks or tools.  Regularly check to see if handrails are loose or broken. Make sure that both sides of any steps have handrails.  Any raised decks and porches should have guardrails on the edges.  Have any leaves, snow, or ice cleared regularly.  Use sand or salt on walking paths during winter.  Clean up any spills in your garage right away. This includes oil or grease spills. What can I do in the bathroom?  Use night lights.  Install grab bars by the toilet and in the tub and shower. Do not use towel bars as grab bars.  Use non-skid mats or decals in the tub or shower.  If you need to sit down in the shower, use a plastic, non-slip  stool.  Keep the floor dry. Clean up any water that spills on the floor as soon as it happens.  Remove soap buildup in the tub or shower regularly.  Attach bath mats securely with double-sided non-slip rug tape.  Do not have throw rugs and other things on the floor that can make you trip. What can I do in the bedroom?  Use night lights.  Make sure that you have a light by your bed that is easy to reach.  Do not use any sheets or blankets that are too big for your bed. They should not hang down onto the floor.  Have a firm chair that has side arms. You can use this for support while you get dressed.  Do not have throw rugs and other things on the floor that can make you trip. What can I do in the kitchen?  Clean up any spills right away.  Avoid walking on wet floors.  Keep items that you use a lot in easy-to-reach places.  If you need to reach something above you, use a strong step stool that has a grab bar.  Keep electrical cords out of the way.  Do not use floor polish or wax that makes floors slippery.  If you must use wax, use non-skid floor wax.  Do not have throw rugs and other things on the floor that can make you trip. What can I do with my stairs?  Do not leave any items on the stairs.  Make sure that there are handrails on both sides of the stairs and use them. Fix handrails that are broken or loose. Make sure that handrails are as long as the stairways.  Check any carpeting to make sure that it is firmly attached to the stairs. Fix any carpet that is loose or worn.  Avoid having throw rugs at the top or bottom of the stairs. If you do have throw rugs, attach them to the floor with carpet tape.  Make sure that you have a light switch at the top of the stairs and the bottom of the stairs. If you do not have them, ask someone to add them for you. What else can I do to help prevent falls?  Wear shoes that:  Do not have high heels.  Have rubber bottoms.  Are  comfortable and fit you well.  Are closed at the toe. Do not wear sandals.  If you use a stepladder:  Make sure that it is fully opened. Do not climb a closed stepladder.  Make sure that both sides of the stepladder are locked into place.  Ask someone to hold it for you, if possible.  Clearly mark and make sure that you can see:  Any grab bars or handrails.  First and last steps.  Where the edge of each step is.  Use tools that help you move around (mobility aids) if they are needed. These include:  Canes.  Walkers.  Scooters.  Crutches.  Turn on the lights when you go into a dark area. Replace any light bulbs as soon as they burn out.  Set up your furniture so you have a clear path. Avoid moving your furniture around.  If any of your floors are uneven, fix them.  If there are any pets around you, be aware of where they are.  Review your medicines with your doctor. Some medicines can make you feel dizzy. This can increase your chance of falling. Ask your doctor what other things that you can do to help prevent falls. This information is not intended to replace advice given to you by your health care provider. Make sure you discuss any questions you have with your health care provider. Document Released: 06/10/2009 Document Revised: 01/20/2016 Document Reviewed: 09/18/2014 Elsevier Interactive Patient Education  2017 [employer].

## 2017-12-27 ENCOUNTER — Ambulatory Visit (INDEPENDENT_AMBULATORY_CARE_PROVIDER_SITE_OTHER): Payer: Medicare Other | Admitting: Family Medicine

## 2017-12-27 ENCOUNTER — Ambulatory Visit
Admission: RE | Admit: 2017-12-27 | Discharge: 2017-12-27 | Disposition: A | Discharge status: Home / Self Care | Payer: Medicare Other | Source: Ambulatory Visit | Attending: Family Medicine | Admitting: Family Medicine

## 2017-12-27 ENCOUNTER — Telehealth: Payer: Self-pay | Admitting: Family Medicine

## 2017-12-27 ENCOUNTER — Encounter: Payer: Self-pay | Admitting: Family Medicine

## 2017-12-27 VITALS — BP 141/79 | HR 64 | Temp 97.3°F | Ht 59.0 in | Wt 144.5 lb

## 2017-12-27 DIAGNOSIS — E782 Mixed hyperlipidemia: Secondary | ICD-10-CM

## 2017-12-27 DIAGNOSIS — G8929 Other chronic pain: Secondary | ICD-10-CM

## 2017-12-27 DIAGNOSIS — K219 Gastro-esophageal reflux disease without esophagitis: Secondary | ICD-10-CM | POA: Diagnosis not present

## 2017-12-27 DIAGNOSIS — M25512 Pain in left shoulder: Secondary | ICD-10-CM | POA: Insufficient documentation

## 2017-12-27 DIAGNOSIS — I700 Atherosclerosis of aorta: Secondary | ICD-10-CM | POA: Diagnosis not present

## 2017-12-27 DIAGNOSIS — I739 Peripheral vascular disease, unspecified: Secondary | ICD-10-CM | POA: Diagnosis not present

## 2017-12-27 DIAGNOSIS — M19012 Primary osteoarthritis, left shoulder: Secondary | ICD-10-CM | POA: Insufficient documentation

## 2017-12-27 DIAGNOSIS — Z0001 Encounter for general adult medical examination with abnormal findings: Secondary | ICD-10-CM

## 2017-12-27 DIAGNOSIS — Z0000 Encounter for general adult medical examination without abnormal findings: Secondary | ICD-10-CM

## 2017-12-27 DIAGNOSIS — I2510 Atherosclerotic heart disease of native coronary artery without angina pectoris: Secondary | ICD-10-CM | POA: Diagnosis not present

## 2017-12-27 DIAGNOSIS — I129 Hypertensive chronic kidney disease with stage 1 through stage 4 chronic kidney disease, or unspecified chronic kidney disease: Secondary | ICD-10-CM | POA: Diagnosis not present

## 2017-12-27 DIAGNOSIS — F419 Anxiety disorder, unspecified: Secondary | ICD-10-CM | POA: Diagnosis not present

## 2017-12-27 DIAGNOSIS — M1A9XX0 Chronic gout, unspecified, without tophus (tophi): Secondary | ICD-10-CM

## 2017-12-27 DIAGNOSIS — E559 Vitamin D deficiency, unspecified: Secondary | ICD-10-CM | POA: Diagnosis not present

## 2017-12-27 DIAGNOSIS — D692 Other nonthrombocytopenic purpura: Secondary | ICD-10-CM

## 2017-12-27 LAB — UA/M W/RFLX CULTURE, ROUTINE
Bilirubin, UA: NEGATIVE
Glucose, UA: NEGATIVE
KETONES UA: NEGATIVE
Leukocytes, UA: NEGATIVE
NITRITE UA: NEGATIVE
Protein, UA: NEGATIVE
RBC, UA: NEGATIVE
Specific Gravity, UA: 1.01 (ref 1.005–1.030)
UUROB: 0.2 mg/dL (ref 0.2–1.0)
pH, UA: 5 (ref 5.0–7.5)

## 2017-12-27 LAB — MICROALBUMIN, URINE WAIVED
Creatinine, Urine Waived: 200 mg/dL (ref 10–300)
Microalb, Ur Waived: 10 mg/L (ref 0–19)
Microalb/Creat Ratio: <30 mg/g (ref <30)

## 2017-12-27 MED ORDER — TIZANIDINE HCL 4 MG PO TABS: ORAL_TABLET | ORAL | 3 refills | Status: DC

## 2017-12-27 MED ORDER — ASPIRIN 81 MG PO TABS: 81.0000 mg | ORAL_TABLET | Freq: Every day | ORAL | 3 refills | Status: DC

## 2017-12-27 MED ORDER — AMLODIPINE BESYLATE 10 MG PO TABS: 10.0000 mg | ORAL_TABLET | Freq: Every day | ORAL | 1 refills | Status: DC

## 2017-12-27 MED ORDER — CLONAZEPAM 0.5 MG PO TABS: 0.5000 mg | ORAL_TABLET | Freq: Every evening | ORAL | 0 refills | Status: DC | PRN

## 2017-12-27 MED ORDER — FLUTICASONE PROPIONATE 50 MCG/ACT NA SUSP: 2.0000 | Freq: Every day | NASAL | 12 refills | Status: DC

## 2017-12-27 MED ORDER — PANTOPRAZOLE SODIUM 40 MG PO TBEC: 40.0000 mg | DELAYED_RELEASE_TABLET | Freq: Every day | ORAL | 1 refills | Status: DC

## 2017-12-27 MED ORDER — SERTRALINE HCL 50 MG PO TABS: 50.0000 mg | ORAL_TABLET | Freq: Every day | ORAL | 1 refills | Status: DC

## 2017-12-27 MED ORDER — ATENOLOL 50 MG PO TABS: 50.0000 mg | ORAL_TABLET | Freq: Every day | ORAL | 1 refills | Status: DC

## 2017-12-27 MED ORDER — BENAZEPRIL HCL 40 MG PO TABS: 40.0000 mg | ORAL_TABLET | Freq: Every day | ORAL | 1 refills | Status: DC

## 2017-12-27 MED ORDER — HYDRALAZINE HCL 25 MG PO TABS: 25.0000 mg | ORAL_TABLET | Freq: Two times a day (BID) | ORAL | 1 refills | Status: DC

## 2017-12-27 MED ORDER — CILIDINIUM-CHLORDIAZEPOXIDE 2.5-5 MG PO CAPS: ORAL_CAPSULE | ORAL | 1 refills | Status: DC

## 2017-12-27 MED ORDER — ATORVASTATIN CALCIUM 40 MG PO TABS: 40.0000 mg | ORAL_TABLET | Freq: Every day | ORAL | 1 refills | Status: DC

## 2017-12-27 MED ORDER — DICLOFENAC SODIUM 1 % TD GEL: 2.0000 g | Freq: Four times a day (QID) | TRANSDERMAL | 2 refills | Status: DC

## 2017-12-27 NOTE — Telephone Encounter (Signed)
Please let her know that her x-ray shows some arthritis, but that's it

## 2017-12-27 NOTE — Assessment & Plan Note (Signed)
Doing better. Stable on current regimen. Continue to monitor. Refills given today.

## 2017-12-27 NOTE — Assessment & Plan Note (Signed)
Will keep BP and cholesterol under good control. Continue to monitor. Call with any concerns.  

## 2017-12-27 NOTE — Telephone Encounter (Signed)
Patient notified

## 2017-12-27 NOTE — Assessment & Plan Note (Signed)
Stable. Continue current regimen. Continue to monitor. Refills given today. Call with any concerns.  

## 2017-12-27 NOTE — Assessment & Plan Note (Signed)
Rechecking levels today. Continue diet and exercise. Continue to monitor. Refills given today.

## 2017-12-27 NOTE — Assessment & Plan Note (Signed)
Reassured. Continue to monitor.

## 2017-12-27 NOTE — Progress Notes (Signed)
BP (!) 141/79 (BP Location: Left Arm, Patient Position: Sitting, Cuff Size: Normal)   Pulse 64   Temp (!) 97.3 F (36.3 C)   Ht 4\' 11"  (1.499 m)   Wt 144 lb 8 oz (65.5 kg)   SpO2 98%   BMI 29.19 kg/m    Subjective:    Patient ID: Marie Parker, female    DOB: 26-Jan-1946, 72 y.o.   MRN: [id number]  HPI: Marie Parker is a 72 y.o. female presenting on 12/27/2017 for comprehensive medical examination. Current medical complaints include:  Has been having some pain in her L shoulder and her elbows.  Has been having some nagging in her RUQ of her belly.  HYPERTENSION / HYPERLIPIDEMIA Satisfied with current treatment? no Duration of hypertension: chronic BP monitoring frequency: not checking BP medication side effects: no Duration of hyperlipidemia: chronic Cholesterol medication side effects: no Cholesterol supplements: none Past cholesterol medications: atorvastatin Medication compliance: excellent compliance Aspirin: yes Recent stressors: no Recurrent headaches: no Visual changes: no Palpitations: no Dyspnea: no Chest pain: no Lower extremity edema: no Dizzy/lightheaded: no  GERD GERD control status: uncontrolled  Satisfied with current treatment? yes Heartburn frequency: daily at night Medication side effects: no  Medication compliance: excellent Dysphagia: no Odynophagia:  no Hematemesis: no Blood in stool: no EGD: no  GOUT- No flares, no issues  She currently lives with: husband Menopausal Symptoms: no  Depression Screen done today and results listed below:  Depression screen [facility] 2/9 12/24/2017 08/29/2017 06/26/2017 12/21/2016 04/18/2016  Decreased Interest 0 0 0 0 0  Down, Depressed, Hopeless 0 0 0 0 0  PHQ - 2 Score 0 0 0 0 0  Altered sleeping - - - - 3  Tired, decreased energy - - - - 0  Change in appetite - - - - 0  Feeling bad or failure about yourself  - - - - 0  Trouble concentrating - - - - 0  Moving slowly or fidgety/restless - - - - 0    Suicidal thoughts - - - - 0  PHQ-9 Score - - - - 3  Difficult doing work/chores - - - - Not difficult at all    Past Medical History:  Past Medical History:  Diagnosis Date  . Anxiety   . Arthritis   . CAD (coronary artery disease)   . Cancer (HCC)    squamous cell  . Chronic kidney disease   . Depression   . Generalized osteoarthritis   . GERD (gastroesophageal reflux disease)   . Gout   . History of endoscopy 11/2012   Gastritis  . Hyperlipidemia   . Hypertension   . IFG (impaired fasting glucose)   . Insomnia   . Menopause   . Osteoporosis   . Parathyroid adenoma   . Personal history of tobacco use, presenting hazards to health 02/02/2016  . PVD (peripheral vascular disease) ([place])   . PVD (peripheral vascular disease) ([place])   . Rotator cuff syndrome of shoulder and allied disorders   . Tobacco abuse     Surgical History:  Past Surgical History:  Procedure Laterality Date  . BREAST BIOPSY Right 2014   neg  . BREAST BIOPSY Left 1970's   neg bx/clip  . BREAST FIBROADENOMA SURGERY    . CARDIAC CATHETERIZATION  09/17/2007   Insignificant CAD with 40-50% stenosis of mid LAD.   [name] ILIAC ARTERY STENT     right iliac artery stent   . ORIF PATELLA Left 03/22/2016   Dr. Tristan Schroeder  .  PARATHYROIDECTOMY    . SKIN BIOPSY     precancerous    Medications:  Current Outpatient Medications on File Prior to Visit  Medication Sig  . acetaminophen (TYLENOL) 500 MG tablet Take 1,000 mg by mouth.  . clobetasol ointment (TEMOVATE) 0.05 % APPLY ONCE AT NIGHT FOR 6 TO 12 WEEKS. IF IMPROVEMENT DECREASE TO 2 TO 3 TIMES WEEKLY  . diphenhydrAMINE (BENADRYL) 25 mg capsule Take 25 mg by mouth every 6 (six) hours as needed for sleep.  [name] NIACIN PO Take 1,000 mg by mouth daily.  [name] nystatin ointment (MYCOSTATIN) Apply 1 application topically 2 (two) times daily.  . traMADol (ULTRAM) 50 MG tablet Take 1 tablet (50 mg total) by mouth every 8 (eight) hours as needed.  . triamcinolone cream  (KENALOG) 0.1 %   . TURMERIC PO Take by mouth.   No current facility-administered medications on file prior to visit.     Allergies:  Allergies  Allergen Reactions  . Crestor [Rosuvastatin]   . Cyclobenzaprine     Other reaction(s): OTHER  . Erythromycin     GI upset   . Fosamax [Alendronate Sodium]     Social History:  Social History   Socioeconomic History  . Marital status: Married    Spouse name: Not on file  . Number of children: Not on file  . Years of education: Not on file  . Highest education level: Not on file  Occupational History  . Not on file  Social Needs  . Financial resource strain: Not hard at all  . Food insecurity:    Worry: Never true    Inability: Never true  . Transportation needs:    Medical: No    Non-medical: No  Tobacco Use  . Smoking status: Former Smoker    Packs/day: 1.00    Years: 42.00    Pack years: 42.00    Types: Cigarettes    Last attempt to quit: 08/27/2000    Years since quitting: 17.3  . Smokeless tobacco: Never Used  Substance and Sexual Activity  . Alcohol use: No  . Drug use: No  . Sexual activity: Never  Lifestyle  . Physical activity:    Days per week: 0 days    Minutes per session: 0 min  . Stress: Not at all  Relationships  . Social connections:    Talks on phone: More than three times a week    Gets together: More than three times a week    Attends religious service: More than 4 times per year    Active member of club or organization: Yes    Attends meetings of clubs or organizations: More than 4 times per year    Relationship status: Married  . Intimate partner violence:    Fear of current or ex partner: No    Emotionally abused: No    Physically abused: No    Forced sexual activity: No  Other Topics Concern  . Not on file  Social History Narrative   Working part time   Social History   Tobacco Use  Smoking Status Former Smoker  . Packs/day: 1.00  . Years: 42.00  . Pack years: 42.00  .  Types: Cigarettes  . Last attempt to quit: 08/27/2000  . Years since quitting: 17.3  Smokeless Tobacco Never Used   Social History   Substance and Sexual Activity  Alcohol Use No    Family History:  Family History  Problem Relation Age of Onset  . Heart attack  Father 28  . Alcohol abuse Father   . Cancer Father   . Heart disease Father   . Liver disease Father   . Hypertension Father   . Heart disease Sister 67       CAD; s/p stent placement  . Stroke Sister   . AAA (abdominal aortic aneurysm) Sister   . Dementia Sister   . Stroke Mother   . Hypertension Mother   . Diabetes Mother   . Stroke Maternal Grandmother   . Heart attack Maternal Grandfather   . Stroke Paternal Grandmother   . Breast cancer Maternal Aunt   . Breast cancer Paternal Aunt     Past medical history, surgical history, medications, allergies, family history and social history reviewed with patient today and changes made to appropriate areas of the chart.   Review of Systems  Constitutional: Positive for diaphoresis. Negative for chills, fever, malaise/fatigue and weight loss.  HENT: Negative.   Eyes: Negative.   Respiratory: Negative.   Cardiovascular: Negative.   Gastrointestinal: Positive for diarrhea and heartburn. Negative for abdominal pain, blood in stool, constipation, melena, nausea and vomiting.  Genitourinary: Negative.   Musculoskeletal: Positive for joint pain. Negative for back pain, falls, myalgias and neck pain.  Skin: Negative.   Neurological: Positive for tingling. Negative for dizziness, tremors, sensory change, speech change, focal weakness, seizures, loss of consciousness, weakness and headaches.  Endo/Heme/Allergies: Positive for environmental allergies. Negative for polydipsia. Bruises/bleeds easily.  Psychiatric/Behavioral: Negative.     All other ROS negative except what is listed above and in the HPI.      Objective:    BP (!) 141/79 (BP Location: Left Arm, Patient  Position: Sitting, Cuff Size: Normal)   Pulse 64   Temp (!) 97.3 F (36.3 C)   Ht 4\' 11"  (1.499 m)   Wt 144 lb 8 oz (65.5 kg)   SpO2 98%   BMI 29.19 kg/m   Wt Readings from Last 3 Encounters:  12/27/17 144 lb 8 oz (65.5 kg)  12/24/17 145 lb 11.2 oz (66.1 kg)  09/14/17 144 lb 4 oz (65.4 kg)    Physical Exam  Constitutional: She is oriented to person, place, and time. She appears well-developed and well-nourished. No distress.  HENT:  Head: Normocephalic and atraumatic.  Right Ear: Hearing, tympanic membrane, external ear and ear canal normal.  Left Ear: Hearing, tympanic membrane, external ear and ear canal normal.  Nose: Nose normal.  Mouth/Throat: Uvula is midline, oropharynx is clear and moist and mucous membranes are normal. No oropharyngeal exudate.  Eyes: Pupils are equal, round, and reactive to light. Conjunctivae, EOM and lids are normal. Right eye exhibits no discharge. Left eye exhibits no discharge. No scleral icterus.  Neck: Normal range of motion. Neck supple. No JVD present. No tracheal deviation present. No thyromegaly present.  Cardiovascular: Normal rate, regular rhythm, normal heart sounds and intact distal pulses. Exam reveals no gallop and no friction rub.  No murmur heard. Pulmonary/Chest: Effort normal and breath sounds normal. No stridor. No respiratory distress. She has no wheezes. She has no rales. She exhibits no tenderness.  Abdominal: Soft. Bowel sounds are normal. She exhibits no distension and no mass. There is no tenderness. There is no rebound and no guarding. No hernia.  Genitourinary:  Genitourinary Comments: Pelvic and Breast exam deferred with shared decision making  Musculoskeletal: Normal range of motion. She exhibits no edema, tenderness or deformity.  Lymphadenopathy:    She has no cervical adenopathy.  Neurological: She is  alert and oriented to person, place, and time. She displays normal reflexes. No cranial nerve deficit or sensory deficit.  She exhibits normal muscle tone. Coordination normal.  Skin: Skin is warm, dry and intact. Capillary refill takes less than 2 seconds. No rash noted. She is not diaphoretic. No erythema. No pallor.  Psychiatric: She has a normal mood and affect. Her speech is normal and behavior is normal. Judgment and thought content normal. Cognition and memory are normal.  Nursing note and vitals reviewed.   Results for orders placed or performed in visit on 08/29/17  Rapid Strep screen  Result Value Ref Range   Strep Gp A Ag, IA W/Reflex Negative Negative  Culture, Group A Strep  Result Value Ref Range   Strep A Culture Negative   Veritor Flu A/B Waived  Result Value Ref Range   Influenza A Negative Negative   Influenza B Negative Negative  POCT Influenza A/B  Result Value Ref Range   Influenza A, POC Negative Negative   Influenza B, POC Negative Negative      Assessment & Plan:   Problem List Items Addressed This Visit      Cardiovascular and Mediastinum   Peripheral vascular disease ([place])    Will keep BP and cholesterol under good control. Continue to monitor. Call with any concerns.       Relevant Medications   hydrALAZINE (APRESOLINE) 25 MG tablet   benazepril (LOTENSIN) 40 MG tablet   atorvastatin (LIPITOR) 40 MG tablet   atenolol (TENORMIN) 50 MG tablet   aspirin 81 MG tablet   amLODipine (NORVASC) 10 MG tablet   Other Relevant Orders   CBC with Differential/Platelet   Comprehensive metabolic panel   TSH   UA/M w/rflx Culture, Routine   VITAMIN D 25 Hydroxy (Vit-D Deficiency, Fractures)   CAD (coronary artery disease)    Will keep BP and cholesterol under good control. Continue to monitor. Call with any concerns.       Relevant Medications   hydrALAZINE (APRESOLINE) 25 MG tablet   benazepril (LOTENSIN) 40 MG tablet   atorvastatin (LIPITOR) 40 MG tablet   atenolol (TENORMIN) 50 MG tablet   aspirin 81 MG tablet   amLODipine (NORVASC) 10 MG tablet   Other Relevant Orders    CBC with Differential/Platelet   Comprehensive metabolic panel   TSH   UA/M w/rflx Culture, Routine   VITAMIN D 25 Hydroxy (Vit-D Deficiency, Fractures)   Senile purpura (HCC)    Reassured. Continue to monitor.       Relevant Medications   hydrALAZINE (APRESOLINE) 25 MG tablet   benazepril (LOTENSIN) 40 MG tablet   atorvastatin (LIPITOR) 40 MG tablet   atenolol (TENORMIN) 50 MG tablet   aspirin 81 MG tablet   amLODipine (NORVASC) 10 MG tablet   Other Relevant Orders   CBC with Differential/Platelet   Comprehensive metabolic panel   TSH   UA/M w/rflx Culture, Routine   VITAMIN D 25 Hydroxy (Vit-D Deficiency, Fractures)     Digestive   GERD (gastroesophageal reflux disease)    Stable. Continue current regimen. Continue to monitor. Refills given today. Call with any concerns.       Relevant Medications   pantoprazole (PROTONIX) 40 MG tablet   clidinium-chlordiazePOXIDE (LIBRAX) 5-2.5 MG capsule   Other Relevant Orders   CBC with Differential/Platelet   Comprehensive metabolic panel   TSH   UA/M w/rflx Culture, Routine   VITAMIN D 25 Hydroxy (Vit-D Deficiency, Fractures)     Genitourinary  Benign hypertensive renal disease    Under good control on recheck. Continue current regimen. Continue to monitor. Call with any concerns.       Relevant Orders   CBC with Differential/Platelet   Comprehensive metabolic panel   Microalbumin, Urine Waived   TSH   UA/M w/rflx Culture, Routine   VITAMIN D 25 Hydroxy (Vit-D Deficiency, Fractures)     Other   Hyperlipidemia    Rechecking levels today. Continue diet and exercise. Continue to monitor. Refills given today.      Relevant Medications   hydrALAZINE (APRESOLINE) 25 MG tablet   benazepril (LOTENSIN) 40 MG tablet   atorvastatin (LIPITOR) 40 MG tablet   atenolol (TENORMIN) 50 MG tablet   aspirin 81 MG tablet   amLODipine (NORVASC) 10 MG tablet   Other Relevant Orders   CBC with Differential/Platelet   Comprehensive  metabolic panel   Lipid Panel w/o Chol/HDL Ratio   TSH   UA/M w/rflx Culture, Routine   VITAMIN D 25 Hydroxy (Vit-D Deficiency, Fractures)   Gout    Rechecking levels today. Continue diet and exercise. Continue to monitor. Refills given today.      Relevant Orders   CBC with Differential/Platelet   Comprehensive metabolic panel   TSH   UA/M w/rflx Culture, Routine   Uric acid   VITAMIN D 25 Hydroxy (Vit-D Deficiency, Fractures)   Acute anxiety    Doing better. Stable on current regimen. Continue to monitor. Refills given today.      Relevant Medications   sertraline (ZOLOFT) 50 MG tablet   Other Relevant Orders   CBC with Differential/Platelet   Comprehensive metabolic panel   TSH   UA/M w/rflx Culture, Routine   VITAMIN D 25 Hydroxy (Vit-D Deficiency, Fractures)    Other Visit Diagnoses    Routine general medical examination at a health care facility    -  Primary   Vaccines up to date. Screening labs checked today. Mammogram ordered. Colon and DEXA up to date. Continue diet and exercise. Call with any concerns.    Chronic left shoulder pain       Likely arthritis. Will check x-ray and start exercises. Call with any concerns.    Relevant Medications   sertraline (ZOLOFT) 50 MG tablet   clonazePAM (KLONOPIN) 0.5 MG tablet   aspirin 81 MG tablet   tiZANidine (ZANAFLEX) 4 MG tablet   Other Relevant Orders   DG Shoulder Left   CBC with Differential/Platelet   Comprehensive metabolic panel   TSH   UA/M w/rflx Culture, Routine   VITAMIN D 25 Hydroxy (Vit-D Deficiency, Fractures)       Follow up plan: No follow-ups on file.   LABORATORY TESTING:  - Pap smear: not applicable  IMMUNIZATIONS:   - Tdap: Tetanus vaccination status reviewed: last tetanus booster within 10 years. - Influenza: Up to date - Pneumovax: Up to date - Prevnar: Up to date - HPV: Not applicable - Zostavax vaccine: Up to date  SCREENING: -Mammogram: Order in  - Colonoscopy: Up to date  -  Bone Density: Up to date   PATIENT COUNSELING:   Advised to take 1 mg of folate supplement per day if capable of pregnancy.   Sexuality: Discussed sexually transmitted diseases, partner selection, use of condoms, avoidance of unintended pregnancy  and contraceptive alternatives.   Advised to avoid cigarette smoking.  I discussed with the patient that most people either abstain from alcohol or drink within safe limits (<=14/week and <=4 drinks/occasion for males, <=7/weeks  and <= 3 drinks/occasion for females) and that the risk for alcohol disorders and other health effects rises proportionally with the number of drinks per week and how often a drinker exceeds daily limits.  Discussed cessation/primary prevention of drug use and availability of treatment for abuse.   Diet: Encouraged to adjust caloric intake to maintain  or achieve ideal body weight, to reduce intake of dietary saturated fat and total fat, to limit sodium intake by avoiding high sodium foods and not adding table salt, and to maintain adequate dietary potassium and calcium preferably from fresh fruits, vegetables, and low-fat dairy products.    stressed the importance of regular exercise  Injury prevention: Discussed safety belts, safety helmets, smoke detector, smoking near bedding or upholstery.   Dental health: Discussed importance of regular tooth brushing, flossing, and dental visits.    NEXT PREVENTATIVE PHYSICAL DUE IN 1 YEAR. No follow-ups on file.

## 2017-12-27 NOTE — Assessment & Plan Note (Signed)
Under good control on recheck. Continue current regimen. Continue to monitor. Call with any concerns.  

## 2017-12-27 NOTE — Patient Instructions (Addendum)
[facility]  Address: [address], [place], [place] [address]  Phone: [phone]   Health Maintenance for Postmenopausal Women Menopause is a normal process in which your reproductive ability comes to an end. This process happens gradually over a span of months to years, usually between the ages of 48 and 55. Menopause is complete when you have missed 12 consecutive menstrual periods. It is important to talk with your health care provider about some of the most common conditions that affect postmenopausal women, such as heart disease, cancer, and bone loss (osteoporosis). Adopting a healthy lifestyle and getting preventive care can help to promote your health and wellness. Those actions can also lower your chances of developing some of these common conditions. What should I know about menopause? During menopause, you may experience a number of symptoms, such as:  Moderate-to-severe hot flashes.  Night sweats.  Decrease in sex drive.  Mood swings.  Headaches.  Tiredness.  Irritability.  Memory problems.  Insomnia.  Choosing to treat or not to treat menopausal changes is an individual decision that you make with your health care provider. What should I know about hormone replacement therapy and supplements? Hormone therapy products are effective for treating symptoms that are associated with menopause, such as hot flashes and night sweats. Hormone replacement carries certain risks, especially as you become older. If you are thinking about using estrogen or estrogen with progestin treatments, discuss the benefits and risks with your health care provider. What should I know about heart disease and stroke? Heart disease, heart attack, and stroke become more likely as you age. This may be due, in part, to the hormonal changes that your body experiences during menopause. These can affect how your body processes dietary fats, triglycerides, and  cholesterol. Heart attack and stroke are both medical emergencies. There are many things that you can do to help prevent heart disease and stroke:  Have your blood pressure checked at least every 1-2 years. High blood pressure causes heart disease and increases the risk of stroke.  If you are 55-79 years old, ask your health care provider if you should take aspirin to prevent a heart attack or a stroke.  Do not use any tobacco products, including cigarettes, chewing tobacco, or electronic cigarettes. If you need help quitting, ask your health care provider.  It is important to eat a healthy diet and maintain a healthy weight. ? Be sure to include plenty of vegetables, fruits, low-fat dairy products, and lean protein. ? Avoid eating foods that are high in solid fats, added sugars, or salt (sodium).  Get regular exercise. This is one of the most important things that you can do for your health. ? Try to exercise for at least 150 minutes each week. The type of exercise that you do should increase your heart rate and make you sweat. This is known as moderate-intensity exercise. ? Try to do strengthening exercises at least twice each week. Do these in addition to the moderate-intensity exercise.  Know your numbers.Ask your health care provider to check your cholesterol and your blood glucose. Continue to have your blood tested as directed by your health care provider.  What should I know about cancer screening? There are several types of cancer. Take the following steps to reduce your risk and to catch any cancer development as early as possible. Breast Cancer  Practice breast self-awareness. ? This means understanding how your breasts normally appear and feel. ? It also means   doing regular breast self-exams. Let your health care provider know about any changes, no matter how small.  If you are 40 or older, have a clinician do a breast exam (clinical breast exam or CBE) every year. Depending  on your age, family history, and medical history, it may be recommended that you also have a yearly breast X-ray (mammogram).  If you have a family history of breast cancer, talk with your health care provider about genetic screening.  If you are at high risk for breast cancer, talk with your health care provider about having an MRI and a mammogram every year.  Breast cancer (BRCA) gene test is recommended for women who have family members with BRCA-related cancers. Results of the assessment will determine the need for genetic counseling and BRCA1 and for BRCA2 testing. BRCA-related cancers include these types: ? Breast. This occurs in males or females. ? Ovarian. ? Tubal. This may also be called fallopian tube cancer. ? Cancer of the abdominal or pelvic lining (peritoneal cancer). ? Prostate. ? Pancreatic.  Cervical, Uterine, and Ovarian Cancer Your health care provider may recommend that you be screened regularly for cancer of the pelvic organs. These include your ovaries, uterus, and vagina. This screening involves a pelvic exam, which includes checking for microscopic changes to the surface of your cervix (Pap test).  For women ages 21-65, health care providers may recommend a pelvic exam and a Pap test every three years. For women ages 30-65, they may recommend the Pap test and pelvic exam, combined with testing for human papilloma virus (HPV), every five years. Some types of HPV increase your risk of cervical cancer. Testing for HPV may also be done on women of any age who have unclear Pap test results.  Other health care providers may not recommend any screening for nonpregnant women who are considered low risk for pelvic cancer and have no symptoms. Ask your health care provider if a screening pelvic exam is right for you.  If you have had past treatment for cervical cancer or a condition that could lead to cancer, you need Pap tests and screening for cancer for at least 20 years after  your treatment. If Pap tests have been discontinued for you, your risk factors (such as having a new sexual partner) need to be reassessed to determine if you should start having screenings again. Some women have medical problems that increase the chance of getting cervical cancer. In these cases, your health care provider may recommend that you have screening and Pap tests more often.  If you have a family history of uterine cancer or ovarian cancer, talk with your health care provider about genetic screening.  If you have vaginal bleeding after reaching menopause, tell your health care provider.  There are currently no reliable tests available to screen for ovarian cancer.  Lung Cancer Lung cancer screening is recommended for adults 55-80 years old who are at high risk for lung cancer because of a history of smoking. A yearly low-dose CT scan of the lungs is recommended if you:  Currently smoke.  Have a history of at least 30 pack-years of smoking and you currently smoke or have quit within the past 15 years. A pack-year is smoking an average of one pack of cigarettes per day for one year.  Yearly screening should:  Continue until it has been 15 years since you quit.  Stop if you develop a health problem that would prevent you from having lung cancer treatment.  Colorectal   Cancer  This type of cancer can be detected and can often be prevented.  Routine colorectal cancer screening usually begins at age 50 and continues through age 75.  If you have risk factors for colon cancer, your health care provider may recommend that you be screened at an earlier age.  If you have a family history of colorectal cancer, talk with your health care provider about genetic screening.  Your health care provider may also recommend using home test kits to check for hidden blood in your stool.  A small camera at the end of a tube can be used to examine your colon directly (sigmoidoscopy or colonoscopy).  This is done to check for the earliest forms of colorectal cancer.  Direct examination of the colon should be repeated every 5-10 years until age 75. However, if early forms of precancerous polyps or small growths are found or if you have a family history or genetic risk for colorectal cancer, you may need to be screened more often.  Skin Cancer  Check your skin from head to toe regularly.  Monitor any moles. Be sure to tell your health care provider: ? About any new moles or changes in moles, especially if there is a change in a mole's shape or color. ? If you have a mole that is larger than the size of a pencil eraser.  If any of your family members has a history of skin cancer, especially at a young age, talk with your health care provider about genetic screening.  Always use sunscreen. Apply sunscreen liberally and repeatedly throughout the day.  Whenever you are outside, protect yourself by wearing long sleeves, pants, a wide-brimmed hat, and sunglasses.  What should I know about osteoporosis? Osteoporosis is a condition in which bone destruction happens more quickly than new bone creation. After menopause, you may be at an increased risk for osteoporosis. To help prevent osteoporosis or the bone fractures that can happen because of osteoporosis, the following is recommended:  If you are 19-50 years old, get at least 1,000 mg of calcium and at least 600 mg of vitamin D per day.  If you are older than age 50 but younger than age 70, get at least 1,200 mg of calcium and at least 600 mg of vitamin D per day.  If you are older than age 70, get at least 1,200 mg of calcium and at least 800 mg of vitamin D per day.  Smoking and excessive alcohol intake increase the risk of osteoporosis. Eat foods that are rich in calcium and vitamin D, and do weight-bearing exercises several times each week as directed by your health care provider. What should I know about how menopause affects my mental  health? Depression may occur at any age, but it is more common as you become older. Common symptoms of depression include:  Low or sad mood.  Changes in sleep patterns.  Changes in appetite or eating patterns.  Feeling an overall lack of motivation or enjoyment of activities that you previously enjoyed.  Frequent crying spells.  Talk with your health care provider if you think that you are experiencing depression. What should I know about immunizations? It is important that you get and maintain your immunizations. These include:  Tetanus, diphtheria, and pertussis (Tdap) booster vaccine.  Influenza every year before the flu season begins.  Pneumonia vaccine.  Shingles vaccine.  Your health care provider may also recommend other immunizations. This information is not intended to replace advice given to   your health care provider. Make sure you discuss any questions you have with your health care provider. Document Released: 10/06/2005 Document Revised: 03/03/2016 Document Reviewed: 05/18/2015 Elsevier Interactive Patient Education  2018 [employer].  Shoulder Exercises Ask your health care provider which exercises are safe for you. Do exercises exactly as told by your health care provider and adjust them as directed. It is normal to feel mild stretching, pulling, tightness, or discomfort as you do these exercises, but you should stop right away if you feel sudden pain or your pain gets worse.Do not begin these exercises until told by your health care provider. RANGE OF MOTION EXERCISES These exercises warm up your muscles and joints and improve the movement and flexibility of your shoulder. These exercises also help to relieve pain, numbness, and tingling. These exercises involve stretching your injured shoulder directly. Exercise A: Pendulum  1. Stand near a wall or a surface that you can hold onto for balance. 2. Bend at the waist and let your left / right arm hang  straight down. Use your other arm to support you. Keep your back straight and do not lock your knees. 3. Relax your left / right arm and shoulder muscles, and move your hips and your trunk so your left / right arm swings freely. Your arm should swing because of the motion of your body, not because you are using your arm or shoulder muscles. 4. Keep moving your body so your arm swings in the following directions, as told by your health care provider: ? Side to side. ? Forward and backward. ? In clockwise and counterclockwise circles. 5. Continue each motion for __________ seconds, or for as long as told by your health care provider. 6. Slowly return to the starting position. Repeat __________ times. Complete this exercise __________ times a day. Exercise B:Flexion, Standing  1. Stand and hold a broomstick, a cane, or a similar object. Place your hands a little more than shoulder-width apart on the object. Your left / right hand should be palm-up, and your other hand should be palm-down. 2. Keep your elbow straight and keep your shoulder muscles relaxed. Push the stick down with your healthy arm to raise your left / right arm in front of your body, and then over your head until you feel a stretch in your shoulder. ? Avoid shrugging your shoulder while you raise your arm. Keep your shoulder blade tucked down toward the middle of your back. 3. Hold for __________ seconds. 4. Slowly return to the starting position. Repeat __________ times. Complete this exercise __________ times a day. Exercise C: Abduction, Standing 1. Stand and hold a broomstick, a cane, or a similar object. Place your hands a little more than shoulder-width apart on the object. Your left / right hand should be palm-up, and your other hand should be palm-down. 2. While keeping your elbow straight and your shoulder muscles relaxed, push the stick across your body toward your left / right side. Raise your left / right arm to the side  of your body and then over your head until you feel a stretch in your shoulder. ? Do not raise your arm above shoulder height, unless your health care provider tells you to do that. ? Avoid shrugging your shoulder while you raise your arm. Keep your shoulder blade tucked down toward the middle of your back. 3. Hold for __________ seconds. 4. Slowly return to the starting position. Repeat __________ times. Complete this exercise __________ times a day. Exercise D:Internal  Rotation  1. Place your left / right hand behind your back, palm-up. 2. Use your other hand to dangle an exercise band, a towel, or a similar object over your shoulder. Grasp the band with your left / right hand so you are holding onto both ends. 3. Gently pull up on the band until you feel a stretch in the front of your left / right shoulder. ? Avoid shrugging your shoulder while you raise your arm. Keep your shoulder blade tucked down toward the middle of your back. 4. Hold for __________ seconds. 5. Release the stretch by letting go of the band and lowering your hands. Repeat __________ times. Complete this exercise __________ times a day. STRETCHING EXERCISES These exercises warm up your muscles and joints and improve the movement and flexibility of your shoulder. These exercises also help to relieve pain, numbness, and tingling. These exercises are done using your healthy shoulder to help stretch the muscles of your injured shoulder. Exercise E: Warehouse manager (External Rotation and Abduction)  1. Stand in a doorway with one of your feet slightly in front of the other. This is called a staggered stance. If you cannot reach your forearms to the door frame, stand facing a corner of a room. 2. Choose one of the following positions as told by your health care provider: ? Place your hands and forearms on the door frame above your head. ? Place your hands and forearms on the door frame at the height of your head. ? Place your  hands on the door frame at the height of your elbows. 3. Slowly move your weight onto your front foot until you feel a stretch across your chest and in the front of your shoulders. Keep your head and chest upright and keep your abdominal muscles tight. 4. Hold for __________ seconds. 5. To release the stretch, shift your weight to your back foot. Repeat __________ times. Complete this stretch __________ times a day. Exercise F:Extension, Standing 1. Stand and hold a broomstick, a cane, or a similar object behind your back. ? Your hands should be a little wider than shoulder-width apart. ? Your palms should face away from your back. 2. Keeping your elbows straight and keeping your shoulder muscles relaxed, move the stick away from your body until you feel a stretch in your shoulder. ? Avoid shrugging your shoulders while you move the stick. Keep your shoulder blade tucked down toward the middle of your back. 3. Hold for __________ seconds. 4. Slowly return to the starting position. Repeat __________ times. Complete this exercise __________ times a day. STRENGTHENING EXERCISES These exercises build strength and endurance in your shoulder. Endurance is the ability to use your muscles for a long time, even after they get tired. Exercise G:External Rotation  1. Sit in a stable chair without armrests. 2. Secure an exercise band at elbow height on your left / right side. 3. Place a soft object, such as a folded towel or a small pillow, between your left / right upper arm and your body to move your elbow a few inches away (about 10 cm) from your side. 4. Hold the end of the band so it is tight and there is no slack. 5. Keeping your elbow pressed against the soft object, move your left / right forearm out, away from your abdomen. Keep your body steady so only your forearm moves. 6. Hold for __________ seconds. 7. Slowly return to the starting position. Repeat __________ times. Complete this  exercise __________  times a day. Exercise H:Shoulder Abduction  1. Sit in a stable chair without armrests, or stand. 2. Hold a __________ weight in your left / right hand, or hold an exercise band with both hands. 3. Start with your arms straight down and your left / right palm facing in, toward your body. 4. Slowly lift your left / right hand out to your side. Do not lift your hand above shoulder height unless your health care provider tells you that this is safe. ? Keep your arms straight. ? Avoid shrugging your shoulder while you do this movement. Keep your shoulder blade tucked down toward the middle of your back. 5. Hold for __________ seconds. 6. Slowly lower your arm, and return to the starting position. Repeat __________ times. Complete this exercise __________ times a day. Exercise I:Shoulder Extension 1. Sit in a stable chair without armrests, or stand. 2. Secure an exercise band to a stable object in front of you where it is at shoulder height. 3. Hold one end of the exercise band in each hand. Your palms should face each other. 4. Straighten your elbows and lift your hands up to shoulder height. 5. Step back, away from the secured end of the exercise band, until the band is tight and there is no slack. 6. Squeeze your shoulder blades together as you pull your hands down to the sides of your thighs. Stop when your hands are straight down by your sides. Do not let your hands go behind your body. 7. Hold for __________ seconds. 8. Slowly return to the starting position. Repeat __________ times. Complete this exercise __________ times a day. Exercise J:Standing Shoulder Row 1. Sit in a stable chair without armrests, or stand. 2. Secure an exercise band to a stable object in front of you so it is at waist height. 3. Hold one end of the exercise band in each hand. Your palms should be in a thumbs-up position. 4. Bend each of your elbows to an "L" shape (about 90 degrees) and keep  your upper arms at your sides. 5. Step back until the band is tight and there is no slack. 6. Slowly pull your elbows back behind you. 7. Hold for __________ seconds. 8. Slowly return to the starting position. Repeat __________ times. Complete this exercise __________ times a day. Exercise K:Shoulder Press-Ups  1. Sit in a stable chair that has armrests. Sit upright, with your feet flat on the floor. 2. Put your hands on the armrests so your elbows are bent and your fingers are pointing forward. Your hands should be about even with the sides of your body. 3. Push down on the armrests and use your arms to lift yourself off of the chair. Straighten your elbows and lift yourself up as much as you comfortably can. ? Move your shoulder blades down, and avoid letting your shoulders move up toward your ears. ? Keep your feet on the ground. As you get stronger, your feet should support less of your body weight as you lift yourself up. 4. Hold for __________ seconds. 5. Slowly lower yourself back into the chair. Repeat __________ times. Complete this exercise __________ times a day. Exercise L: Wall Push-Ups  1. Stand so you are facing a stable wall. Your feet should be about one arm-length away from the wall. 2. Lean forward and place your palms on the wall at shoulder height. 3. Keep your feet flat on the floor as you bend your elbows and lean forward toward the wall.  4. Hold for __________ seconds. 5. Straighten your elbows to push yourself back to the starting position. Repeat __________ times. Complete this exercise __________ times a day. This information is not intended to replace advice given to you by your health care provider. Make sure you discuss any questions you have with your health care provider. Document Released: 06/28/2005 Document Revised: 05/08/2016 Document Reviewed: 04/25/2015 Elsevier Interactive Patient Education  2018 [employer].

## 2017-12-28 LAB — LIPID PANEL W/O CHOL/HDL RATIO
Cholesterol, Total: 126 mg/dL (ref 100–199)
HDL: 49 mg/dL (ref >39)
LDL CALC: 51 mg/dL (ref 0–99)
Triglycerides: 128 mg/dL (ref 0–149)
VLDL CHOLESTEROL CAL: 26 mg/dL (ref 5–40)

## 2017-12-28 LAB — COMPREHENSIVE METABOLIC PANEL
ALBUMIN: 4.4 g/dL (ref 3.5–4.8)
ALT: 14 IU/L (ref 0–32)
AST: 19 IU/L (ref 0–40)
Albumin/Globulin Ratio: 2 (ref 1.2–2.2)
Alkaline Phosphatase: 71 IU/L (ref 39–117)
BILIRUBIN TOTAL: 0.4 mg/dL (ref 0.0–1.2)
BUN / CREAT RATIO: 14 (ref 12–28)
BUN: 12 mg/dL (ref 8–27)
CALCIUM: 9.4 mg/dL (ref 8.7–10.3)
CO2: 24 mmol/L (ref 20–29)
CREATININE: 0.88 mg/dL (ref 0.57–1.00)
Chloride: 104 mmol/L (ref 96–106)
GFR calc non Af Amer: 67 mL/min/{1.73_m2} (ref >59)
GFR, EST AFRICAN AMERICAN: 77 mL/min/{1.73_m2} (ref >59)
GLUCOSE: 102 mg/dL — AB (ref 65–99)
Globulin, Total: 2.2 g/dL (ref 1.5–4.5)
Potassium: 4.1 mmol/L (ref 3.5–5.2)
Sodium: 143 mmol/L (ref 134–144)
Total Protein: 6.6 g/dL (ref 6.0–8.5)

## 2017-12-28 LAB — CBC WITH DIFFERENTIAL/PLATELET
BASOS ABS: 0 10*3/uL (ref 0.0–0.2)
Basos: 0 %
EOS (ABSOLUTE): 0.2 10*3/uL (ref 0.0–0.4)
Eos: 5 %
HEMOGLOBIN: 12.6 g/dL (ref 11.1–15.9)
Hematocrit: 37.9 % (ref 34.0–46.6)
IMMATURE GRANS (ABS): 0 10*3/uL (ref 0.0–0.1)
Immature Granulocytes: 0 %
LYMPHS: 28 %
Lymphocytes Absolute: 1.4 10*3/uL (ref 0.7–3.1)
MCH: 29.7 pg (ref 26.6–33.0)
MCHC: 33.2 g/dL (ref 31.5–35.7)
MCV: 89 fL (ref 79–97)
Monocytes Absolute: 0.4 10*3/uL (ref 0.1–0.9)
Monocytes: 7 %
NEUTROS ABS: 2.9 10*3/uL (ref 1.4–7.0)
Neutrophils: 60 %
Platelets: 233 10*3/uL (ref 150–379)
RBC: 4.24 x10E6/uL (ref 3.77–5.28)
RDW: 14 % (ref 12.3–15.4)
WBC: 4.9 10*3/uL (ref 3.4–10.8)

## 2017-12-28 LAB — URIC ACID: URIC ACID: 8.5 mg/dL — AB (ref 2.5–7.1)

## 2017-12-28 LAB — TSH: TSH: 2.07 u[IU]/mL (ref 0.450–4.500)

## 2017-12-28 LAB — VITAMIN D 25 HYDROXY (VIT D DEFICIENCY, FRACTURES): VIT D 25 HYDROXY: 36.7 ng/mL (ref 30.0–100.0)

## 2018-01-12 ENCOUNTER — Other Ambulatory Visit: Payer: Self-pay | Admitting: Family Medicine

## 2018-02-12 ENCOUNTER — Ambulatory Visit (INDEPENDENT_AMBULATORY_CARE_PROVIDER_SITE_OTHER): Payer: Medicare Other | Admitting: Family Medicine

## 2018-02-12 ENCOUNTER — Encounter: Payer: Self-pay | Admitting: Family Medicine

## 2018-02-12 VITALS — BP 143/85 | HR 61 | Temp 98.7°F | Wt 145.1 lb

## 2018-02-12 DIAGNOSIS — R1011 Right upper quadrant pain: Secondary | ICD-10-CM

## 2018-02-12 DIAGNOSIS — K219 Gastro-esophageal reflux disease without esophagitis: Secondary | ICD-10-CM | POA: Diagnosis not present

## 2018-02-12 MED ORDER — RANITIDINE HCL 300 MG PO TABS: 300.0000 mg | ORAL_TABLET | Freq: Two times a day (BID) | ORAL | 1 refills | Status: DC

## 2018-02-12 NOTE — Assessment & Plan Note (Signed)
Not under good control. Will start on zantac as well as protonix, if not getting better, will get her into GI. Call with any concerns.

## 2018-02-12 NOTE — Progress Notes (Signed)
BP (!) 143/85 (BP Location: Left Arm, Patient Position: Sitting, Cuff Size: Normal)   Pulse 61   Temp 98.7 F (37.1 C)   Wt 145 lb 2 oz (65.8 kg)   SpO2 97%   BMI 29.31 kg/m    Subjective:    Patient ID: Marie Parker, female    DOB: 1946-02-24, 72 y.o.   MRN: [id number]  HPI: Marie Parker is a 72 y.o. female  Chief Complaint  Patient presents with  . Abdominal Pain    Patient states that she is having pain in her right side that cross's the front of her stomach X 3 months, pain is not severe, but irritating   ABDOMINAL ISSUES Duration: 3 months Nature: dull aching Location: RUQ  Severity: moderate  Radiation: yes- into her back Episode duration: few hours, better with having a bowel movement Frequency: intermittent Alleviating factors: having a bowel movement Aggravating factors: eating Treatments attempted: PPI Constipation: no Diarrhea: yes Mucous in the stool: no Heartburn: yes Bloating:yes Flatulence: yes Nausea: no Vomiting: no Melena or hematochezia: no Rash: no Jaundice: no Fever: no Weight loss: no   Relevant past medical, surgical, family and social history reviewed and updated as indicated. Interim medical history since our last visit reviewed. Allergies and medications reviewed and updated.  Review of Systems  Constitutional: Negative.   Respiratory: Negative.   Cardiovascular: Negative.   Gastrointestinal: Positive for abdominal distention, abdominal pain and diarrhea. Negative for anal bleeding, blood in stool, constipation, nausea, rectal pain and vomiting.  Genitourinary: Negative.   Skin: Negative.   Psychiatric/Behavioral: Negative.     Per HPI unless specifically indicated above     Objective:    BP (!) 143/85 (BP Location: Left Arm, Patient Position: Sitting, Cuff Size: Normal)   Pulse 61   Temp 98.7 F (37.1 C)   Wt 145 lb 2 oz (65.8 kg)   SpO2 97%   BMI 29.31 kg/m   Wt Readings from Last 3 Encounters:  02/12/18  145 lb 2 oz (65.8 kg)  12/27/17 144 lb 8 oz (65.5 kg)  12/24/17 145 lb 11.2 oz (66.1 kg)    Physical Exam  Constitutional: She is oriented to person, place, and time. She appears well-developed and well-nourished. No distress.  HENT:  Head: Normocephalic and atraumatic.  Right Ear: Hearing normal.  Left Ear: Hearing normal.  Nose: Nose normal.  Eyes: Conjunctivae and lids are normal. Right eye exhibits no discharge. Left eye exhibits no discharge. No scleral icterus.  Cardiovascular: Normal rate, regular rhythm, normal heart sounds and intact distal pulses. Exam reveals no gallop and no friction rub.  No murmur heard. Pulmonary/Chest: Effort normal and breath sounds normal. No stridor. No respiratory distress. She has no wheezes. She has no rhonchi. She has no rales. She exhibits no tenderness.  Abdominal: Soft. Normal appearance, normal aorta and bowel sounds are normal. She exhibits no shifting dullness, no distension, no pulsatile liver, no fluid wave, no abdominal bruit, no ascites, no pulsatile midline mass and no mass. There is no hepatosplenomegaly, splenomegaly or hepatomegaly. There is tenderness in the right upper quadrant. There is no rigidity, no rebound, no guarding, no CVA tenderness, no tenderness at McBurney's point and negative Murphy's sign. No hernia. Hernia confirmed negative in the ventral area, confirmed negative in the right inguinal area and confirmed negative in the left inguinal area.  Musculoskeletal: Normal range of motion.  Neurological: She is alert and oriented to person, place, and time.  Skin: Skin is warm, dry  and intact. Capillary refill takes less than 2 seconds. No rash noted. She is not diaphoretic. No cyanosis or erythema. No pallor.  Psychiatric: She has a normal mood and affect. Her speech is normal and behavior is normal. Judgment and thought content normal. Cognition and memory are normal.  Nursing note and vitals reviewed.   Results for orders placed  or performed in visit on 12/27/17  CBC with Differential/Platelet  Result Value Ref Range   WBC 4.9 3.4 - 10.8 x10E3/uL   RBC 4.24 3.77 - 5.28 x10E6/uL   Hemoglobin 12.6 11.1 - 15.9 g/dL   Hematocrit 37.9 34.0 - 46.6 %   MCV 89 79 - 97 fL   MCH 29.7 26.6 - 33.0 pg   MCHC 33.2 31.5 - 35.7 g/dL   RDW 14.0 12.3 - 15.4 %   Platelets 233 150 - 379 x10E3/uL   Neutrophils 60 Not Estab. %   Lymphs 28 Not Estab. %   Monocytes 7 Not Estab. %   Eos 5 Not Estab. %   Basos 0 Not Estab. %   Neutrophils Absolute 2.9 1.4 - 7.0 x10E3/uL   Lymphocytes Absolute 1.4 0.7 - 3.1 x10E3/uL   Monocytes Absolute 0.4 0.1 - 0.9 x10E3/uL   EOS (ABSOLUTE) 0.2 0.0 - 0.4 x10E3/uL   Basophils Absolute 0.0 0.0 - 0.2 x10E3/uL   Immature Granulocytes 0 Not Estab. %   Immature Grans (Abs) 0.0 0.0 - 0.1 x10E3/uL  Comprehensive metabolic panel  Result Value Ref Range   Glucose 102 (H) 65 - 99 mg/dL   BUN 12 8 - 27 mg/dL   Creatinine, Ser 0.88 0.57 - 1.00 mg/dL   GFR calc non Af Amer 67 >59 mL/min/1.73   GFR calc Af Amer 77 >59 mL/min/1.73   BUN/Creatinine Ratio 14 12 - 28   Sodium 143 134 - 144 mmol/L   Potassium 4.1 3.5 - 5.2 mmol/L   Chloride 104 96 - 106 mmol/L   CO2 24 20 - 29 mmol/L   Calcium 9.4 8.7 - 10.3 mg/dL   Total Protein 6.6 6.0 - 8.5 g/dL   Albumin 4.4 3.5 - 4.8 g/dL   Globulin, Total 2.2 1.5 - 4.5 g/dL   Albumin/Globulin Ratio 2.0 1.2 - 2.2   Bilirubin Total 0.4 0.0 - 1.2 mg/dL   Alkaline Phosphatase 71 39 - 117 IU/L   AST 19 0 - 40 IU/L   ALT 14 0 - 32 IU/L  Lipid Panel w/o Chol/HDL Ratio  Result Value Ref Range   Cholesterol, Total 126 100 - 199 mg/dL   Triglycerides 128 0 - 149 mg/dL   HDL 49 >39 mg/dL   VLDL Cholesterol Cal 26 5 - 40 mg/dL   LDL Calculated 51 0 - 99 mg/dL  Microalbumin, Urine Waived  Result Value Ref Range   Microalb, Ur Waived 10 0 - 19 mg/L   Creatinine, Urine Waived 200 10 - 300 mg/dL   Microalb/Creat Ratio <30 <30 mg/g  TSH  Result Value Ref Range   TSH  2.070 0.450 - 4.500 uIU/mL  UA/M w/rflx Culture, Routine  Result Value Ref Range   Specific Gravity, UA 1.010 1.005 - 1.030   pH, UA 5.0 5.0 - 7.5   Color, UA Yellow Yellow   Appearance Ur Clear Clear   Leukocytes, UA Negative Negative   Protein, UA Negative Negative/Trace   Glucose, UA Negative Negative   Ketones, UA Negative Negative   RBC, UA Negative Negative   Bilirubin, UA Negative Negative   Urobilinogen,  Ur 0.2 0.2 - 1.0 mg/dL   Nitrite, UA Negative Negative  Uric acid  Result Value Ref Range   Uric Acid 8.5 (H) 2.5 - 7.1 mg/dL  VITAMIN D 25 Hydroxy (Vit-D Deficiency, Fractures)  Result Value Ref Range   Vit D, 25-Hydroxy 36.7 30.0 - 100.0 ng/mL      Assessment & Plan:   Problem List Items Addressed This Visit      Digestive   GERD (gastroesophageal reflux disease)    Not under good control. Will start on zantac as well as protonix, if not getting better, will get her into GI. Call with any concerns.       Relevant Medications   ranitidine (ZANTAC) 300 MG tablet    Other Visit Diagnoses    RUQ pain    -  Primary   ?GERD- labs normal last visit. Will get RUQ [place]. Await results.    Relevant Orders   US Abdomen Limited RUQ       Follow up plan: Return in about 1 month (around 03/12/2018) for follow up stomach issues.

## 2018-02-12 NOTE — Patient Instructions (Signed)
[facility] Address: [address] [place], [place] [address]  [phone]  Wednesday 02/20/18 9:15AM Nothing to eat or drink after midnight

## 2018-02-20 ENCOUNTER — Ambulatory Visit
Admission: RE | Admit: 2018-02-20 | Discharge: 2018-02-20 | Disposition: A | Discharge status: Home / Self Care | Payer: Medicare Other | Source: Ambulatory Visit | Attending: Family Medicine | Admitting: Family Medicine

## 2018-02-20 DIAGNOSIS — R1011 Right upper quadrant pain: Secondary | ICD-10-CM

## 2018-02-20 DIAGNOSIS — K7689 Other specified diseases of liver: Secondary | ICD-10-CM | POA: Diagnosis not present

## 2018-02-22 ENCOUNTER — Telehealth: Payer: Self-pay | Admitting: Family Medicine

## 2018-02-22 DIAGNOSIS — K7689 Other specified diseases of liver: Secondary | ICD-10-CM | POA: Insufficient documentation

## 2018-02-22 NOTE — Telephone Encounter (Signed)
Perfect. Thanks.

## 2018-02-22 NOTE — Telephone Encounter (Signed)
Patient states that she thinks that the Zantac is helping, and that she will see how she is doing when she comes back in a month.

## 2018-02-22 NOTE — Telephone Encounter (Signed)
Please let her know that her [place] was normal except for a liver cyst. It is likely not causing her pain, but if it doesn't seem to be getting better, we'll get her into see the surgeon.

## 2018-02-23 ENCOUNTER — Other Ambulatory Visit: Payer: Self-pay | Admitting: Family Medicine

## 2018-03-11 ENCOUNTER — Ambulatory Visit: Payer: Self-pay

## 2018-03-12 ENCOUNTER — Ambulatory Visit: Payer: Self-pay

## 2018-03-14 ENCOUNTER — Encounter: Payer: Self-pay | Admitting: Family Medicine

## 2018-03-14 ENCOUNTER — Encounter: Payer: Self-pay | Admitting: General Surgery

## 2018-03-14 ENCOUNTER — Ambulatory Visit (INDEPENDENT_AMBULATORY_CARE_PROVIDER_SITE_OTHER): Payer: Medicare Other | Admitting: Family Medicine

## 2018-03-14 VITALS — BP 134/70 | HR 63 | Temp 97.7°F | Wt 144.4 lb

## 2018-03-14 DIAGNOSIS — R1011 Right upper quadrant pain: Secondary | ICD-10-CM | POA: Diagnosis not present

## 2018-03-14 NOTE — Patient Instructions (Signed)
You have an appointment scheduled at:  [facility] with Dr. Fleet Contras 04/09/2018 at 11:15AM.  Please arrive by 11:00AM, take your current medications, ID and insurance card. [facility] will be sending you new patient paper work via mail to be completed before your appointment.

## 2018-03-14 NOTE — Progress Notes (Signed)
BP 134/70 (BP Location: Left Arm, Patient Position: Sitting, Cuff Size: Normal)   Pulse 63   Temp 97.7 F (36.5 C)   Wt 144 lb 6 oz (65.5 kg)   SpO2 97%   BMI 29.16 kg/m    Subjective:    Patient ID: Marie Parker, female    DOB: [birth year], 72 y.o.   MRN: [id number]  HPI: Marie Parker is a 72 y.o. female  Chief Complaint  Patient presents with  . Abdominal Pain    Patient states that the pain comes and goes, she states that yesterday was bad, she had a lot of pain and diarrhea after eating.     Not feeling particularly better. She thinks that the zantac helped a bit, but is not sure. Continues to have a lot of pain in her RUQ. Worse with sitting, but also bad with eating has diarrhea. No fevers. No chills. Some nausea. Some heart burn. She is otherwise feeling well with no other concerns or complaints at this time.   Relevant past medical, surgical, family and social history reviewed and updated as indicated. Interim medical history since our last visit reviewed. Allergies and medications reviewed and updated.  Review of Systems  Constitutional: Negative.   Respiratory: Negative.   Cardiovascular: Negative.   Gastrointestinal: Positive for abdominal distention, abdominal pain and diarrhea. Negative for anal bleeding, blood in stool, constipation, nausea, rectal pain and vomiting.  Skin: Negative.   Psychiatric/Behavioral: Negative.     Per HPI unless specifically indicated above     Objective:    BP 134/70 (BP Location: Left Arm, Patient Position: Sitting, Cuff Size: Normal)   Pulse 63   Temp 97.7 F (36.5 C)   Wt 144 lb 6 oz (65.5 kg)   SpO2 97%   BMI 29.16 kg/m   Wt Readings from Last 3 Encounters:  03/14/18 144 lb 6 oz (65.5 kg)  02/12/18 145 lb 2 oz (65.8 kg)  12/27/17 144 lb 8 oz (65.5 kg)    Physical Exam  Constitutional: She is oriented to person, place, and time. She appears well-developed and well-nourished. No distress.  HENT:  Head:  Normocephalic and atraumatic.  Right Ear: Hearing normal.  Left Ear: Hearing normal.  Nose: Nose normal.  Eyes: Conjunctivae and lids are normal. Right eye exhibits no discharge. Left eye exhibits no discharge. No scleral icterus.  Cardiovascular: Normal rate, regular rhythm, normal heart sounds and intact distal pulses. Exam reveals no gallop and no friction rub.  No murmur heard. Pulmonary/Chest: Effort normal and breath sounds normal. No stridor. No respiratory distress. She has no wheezes. She has no rhonchi. She has no rales. She exhibits no tenderness.  Abdominal: Soft. Normal appearance and bowel sounds are normal. There is no hepatosplenomegaly, splenomegaly or hepatomegaly. There is tenderness in the right upper quadrant. There is no rigidity, no rebound, no guarding, no CVA tenderness, no tenderness at McBurney's point and negative Murphy's sign.  Musculoskeletal: Normal range of motion.  Neurological: She is alert and oriented to person, place, and time.  Skin: Skin is warm, dry and intact. Capillary refill takes less than 2 seconds. No rash noted. She is not diaphoretic. No cyanosis or erythema. No pallor.  Psychiatric: She has a normal mood and affect. Her speech is normal and behavior is normal. Judgment and thought content normal. Cognition and memory are normal.  Nursing note and vitals reviewed.   Results for orders placed or performed in visit on 12/27/17  CBC with Differential/Platelet  Result  Value Ref Range   WBC 4.9 3.4 - 10.8 x10E3/uL   RBC 4.24 3.77 - 5.28 x10E6/uL   Hemoglobin 12.6 11.1 - 15.9 g/dL   Hematocrit 37.9 34.0 - 46.6 %   MCV 89 79 - 97 fL   MCH 29.7 26.6 - 33.0 pg   MCHC 33.2 31.5 - 35.7 g/dL   RDW 14.0 12.3 - 15.4 %   Platelets 233 150 - 379 x10E3/uL   Neutrophils 60 Not Estab. %   Lymphs 28 Not Estab. %   Monocytes 7 Not Estab. %   Eos 5 Not Estab. %   Basos 0 Not Estab. %   Neutrophils Absolute 2.9 1.4 - 7.0 x10E3/uL   Lymphocytes Absolute 1.4  0.7 - 3.1 x10E3/uL   Monocytes Absolute 0.4 0.1 - 0.9 x10E3/uL   EOS (ABSOLUTE) 0.2 0.0 - 0.4 x10E3/uL   Basophils Absolute 0.0 0.0 - 0.2 x10E3/uL   Immature Granulocytes 0 Not Estab. %   Immature Grans (Abs) 0.0 0.0 - 0.1 x10E3/uL  Comprehensive metabolic panel  Result Value Ref Range   Glucose 102 (H) 65 - 99 mg/dL   BUN 12 8 - 27 mg/dL   Creatinine, Ser 0.88 0.57 - 1.00 mg/dL   GFR calc non Af Amer 67 >59 mL/min/1.73   GFR calc Af Amer 77 >59 mL/min/1.73   BUN/Creatinine Ratio 14 12 - 28   Sodium 143 134 - 144 mmol/L   Potassium 4.1 3.5 - 5.2 mmol/L   Chloride 104 96 - 106 mmol/L   CO2 24 20 - 29 mmol/L   Calcium 9.4 8.7 - 10.3 mg/dL   Total Protein 6.6 6.0 - 8.5 g/dL   Albumin 4.4 3.5 - 4.8 g/dL   Globulin, Total 2.2 1.5 - 4.5 g/dL   Albumin/Globulin Ratio 2.0 1.2 - 2.2   Bilirubin Total 0.4 0.0 - 1.2 mg/dL   Alkaline Phosphatase 71 39 - 117 IU/L   AST 19 0 - 40 IU/L   ALT 14 0 - 32 IU/L  Lipid Panel w/o Chol/HDL Ratio  Result Value Ref Range   Cholesterol, Total 126 100 - 199 mg/dL   Triglycerides 128 0 - 149 mg/dL   HDL 49 >39 mg/dL   VLDL Cholesterol Cal 26 5 - 40 mg/dL   LDL Calculated 51 0 - 99 mg/dL  Microalbumin, Urine Waived  Result Value Ref Range   Microalb, Ur Waived 10 0 - 19 mg/L   Creatinine, Urine Waived 200 10 - 300 mg/dL   Microalb/Creat Ratio <30 <30 mg/g  TSH  Result Value Ref Range   TSH 2.070 0.450 - 4.500 uIU/mL  UA/M w/rflx Culture, Routine  Result Value Ref Range   Specific Gravity, UA 1.010 1.005 - 1.030   pH, UA 5.0 5.0 - 7.5   Color, UA Yellow Yellow   Appearance Ur Clear Clear   Leukocytes, UA Negative Negative   Protein, UA Negative Negative/Trace   Glucose, UA Negative Negative   Ketones, UA Negative Negative   RBC, UA Negative Negative   Bilirubin, UA Negative Negative   Urobilinogen, Ur 0.2 0.2 - 1.0 mg/dL   Nitrite, UA Negative Negative  Uric acid  Result Value Ref Range   Uric Acid 8.5 (H) 2.5 - 7.1 mg/dL  VITAMIN D 25  Hydroxy (Vit-D Deficiency, Fractures)  Result Value Ref Range   Vit D, 25-Hydroxy 36.7 30.0 - 100.0 ng/mL      Assessment & Plan:   Problem List Items Addressed This Visit    None  Visit Diagnoses    RUQ pain    -  Primary   RUQ [place] negative. Not better. Concern for dysfunctional gall bladder. Will get her into surgery. Call with any concerns.    Relevant Orders   Ambulatory referral to General Surgery       Follow up plan: Return if symptoms worsen or fail to improve.

## 2018-03-19 ENCOUNTER — Ambulatory Visit
Admission: RE | Admit: 2018-03-19 | Discharge: 2018-03-19 | Disposition: A | Discharge status: Home / Self Care | Payer: Medicare Other | Source: Ambulatory Visit | Attending: Family Medicine | Admitting: Family Medicine

## 2018-03-19 DIAGNOSIS — Z1231 Encounter for screening mammogram for malignant neoplasm of breast: Secondary | ICD-10-CM | POA: Insufficient documentation

## 2018-03-19 DIAGNOSIS — Z1239 Encounter for other screening for malignant neoplasm of breast: Secondary | ICD-10-CM

## 2018-03-20 ENCOUNTER — Other Ambulatory Visit: Payer: Self-pay | Admitting: Family Medicine

## 2018-03-20 DIAGNOSIS — R928 Other abnormal and inconclusive findings on diagnostic imaging of breast: Secondary | ICD-10-CM

## 2018-04-02 ENCOUNTER — Ambulatory Visit
Admission: RE | Admit: 2018-04-02 | Discharge: 2018-04-02 | Disposition: A | Discharge status: Home / Self Care | Payer: Medicare Other | Source: Ambulatory Visit | Attending: Family Medicine | Admitting: Family Medicine

## 2018-04-02 ENCOUNTER — Other Ambulatory Visit: Payer: Self-pay | Admitting: Family Medicine

## 2018-04-02 DIAGNOSIS — R928 Other abnormal and inconclusive findings on diagnostic imaging of breast: Secondary | ICD-10-CM

## 2018-04-02 DIAGNOSIS — R920 Mammographic microcalcification found on diagnostic imaging of breast: Secondary | ICD-10-CM | POA: Diagnosis not present

## 2018-04-02 DIAGNOSIS — N6321 Unspecified lump in the left breast, upper outer quadrant: Secondary | ICD-10-CM | POA: Diagnosis not present

## 2018-04-02 DIAGNOSIS — N6322 Unspecified lump in the left breast, upper inner quadrant: Secondary | ICD-10-CM | POA: Diagnosis not present

## 2018-04-02 DIAGNOSIS — N6320 Unspecified lump in the left breast, unspecified quadrant: Secondary | ICD-10-CM

## 2018-04-05 ENCOUNTER — Ambulatory Visit
Admission: RE | Admit: 2018-04-05 | Discharge: 2018-04-05 | Disposition: A | Discharge status: Home / Self Care | Payer: Medicare Other | Source: Ambulatory Visit | Attending: Family Medicine | Admitting: Family Medicine

## 2018-04-05 DIAGNOSIS — R928 Other abnormal and inconclusive findings on diagnostic imaging of breast: Secondary | ICD-10-CM | POA: Diagnosis not present

## 2018-04-05 DIAGNOSIS — N6320 Unspecified lump in the left breast, unspecified quadrant: Secondary | ICD-10-CM | POA: Diagnosis not present

## 2018-04-05 DIAGNOSIS — N6322 Unspecified lump in the left breast, upper inner quadrant: Secondary | ICD-10-CM | POA: Diagnosis not present

## 2018-04-05 DIAGNOSIS — C50812 Malignant neoplasm of overlapping sites of left female breast: Secondary | ICD-10-CM | POA: Diagnosis not present

## 2018-04-05 DIAGNOSIS — C801 Malignant (primary) neoplasm, unspecified: Secondary | ICD-10-CM | POA: Diagnosis not present

## 2018-04-05 DIAGNOSIS — N6321 Unspecified lump in the left breast, upper outer quadrant: Secondary | ICD-10-CM | POA: Diagnosis not present

## 2018-04-05 HISTORY — PX: BREAST BIOPSY: SHX20

## 2018-04-08 ENCOUNTER — Other Ambulatory Visit: Payer: Self-pay | Admitting: Anatomic Pathology & Clinical Pathology

## 2018-04-08 ENCOUNTER — Telehealth: Payer: Self-pay | Admitting: Family Medicine

## 2018-04-08 NOTE — Telephone Encounter (Signed)
Patient states that she wanted to know if we had the results back yet, but she had already figured out that we did not, so she didn't need [place] to call her.

## 2018-04-08 NOTE — Telephone Encounter (Signed)
I don't have those results back yet. The breast center should call her with the results when they are back, but I'll be happy to call her when they are back.

## 2018-04-08 NOTE — Telephone Encounter (Signed)
Copied from [place] [id number]. Topic: Quick Communication - Other Results >> Apr 08, 2018  1:52 PM Robina Ade, Helene Kelp D wrote: Patient called and would like to talk to Dr. Wynetta Emery or her CMA about her biopsy she had last week. She wants her results given to her verbally, please call pt back, thanks.

## 2018-04-09 ENCOUNTER — Ambulatory Visit: Payer: Self-pay

## 2018-04-09 ENCOUNTER — Ambulatory Visit: Payer: Medicare Other | Admitting: General Surgery

## 2018-04-09 ENCOUNTER — Encounter: Payer: Self-pay | Admitting: General Surgery

## 2018-04-09 VITALS — BP 134/72 | HR 78 | Resp 14 | Ht <= 58 in | Wt 145.0 lb

## 2018-04-09 DIAGNOSIS — C50812 Malignant neoplasm of overlapping sites of left female breast: Secondary | ICD-10-CM

## 2018-04-09 DIAGNOSIS — K819 Cholecystitis, unspecified: Secondary | ICD-10-CM | POA: Diagnosis not present

## 2018-04-09 DIAGNOSIS — H10012 Acute follicular conjunctivitis, left eye: Secondary | ICD-10-CM | POA: Diagnosis not present

## 2018-04-09 NOTE — Progress Notes (Signed)
Patient ID: Marie Parker, female   DOB: March 21, 1946, 72 y.o.   MRN: [id number]  Chief Complaint  Patient presents with  . Abdominal Pain  . Breast Cancer    HPI Marie Parker is a 72 y.o. female here for evaluation of problems with her gallbladder. She also has been recently diagnosed with breast cancer of the left breast. She had an ultrasound of the gallbladder done on 02/20/18. She has had pain in the right upper quadrant after eating for the past 3 months off and on. The pain will radiate down her right side. She will have a bowel movement usually right after eating. Bowel movements will be runny at times. She has a history of acid reflux for many years.  The most recent mammogram was done on 03/19/18, with repeat views on 04/02/18, and a left breast biopsy done on 04/05/18. She reports no current breast pain or discomfort. She has had a couple negative biopsies of the left breast one in 2014 and in the 70s.  Patient does perform regular self breast checks and gets regular mammograms done.    She is a retired Event organiser. She works part time 3 days a week.  HPI  Past Medical History:  Diagnosis Date  . Anxiety   . Arthritis   . CAD (coronary artery disease)   . Cancer (HCC)    squamous cell  . Chronic kidney disease   . Depression   . Generalized osteoarthritis   . GERD (gastroesophageal reflux disease)   . Gout   . History of endoscopy 11/2012   Gastritis  . Hyperlipidemia   . Hypertension   . IFG (impaired fasting glucose)   . Insomnia   . Menopause   . Osteoporosis   . Parathyroid adenoma   . Personal history of tobacco use, presenting hazards to health 02/02/2016  . PVD (peripheral vascular disease) ([place])   . PVD (peripheral vascular disease) ([place])   . Rotator cuff syndrome of shoulder and allied disorders   . Tobacco abuse     Past Surgical History:  Procedure Laterality Date  . BREAST BIOPSY Left 2014   neg core  . BREAST BIOPSY Left 04/05/2018  . BREAST  EXCISIONAL BIOPSY Left 1970's   neg  . BREAST FIBROADENOMA SURGERY    . CARDIAC CATHETERIZATION  09/17/2007   Insignificant CAD with 40-50% stenosis of mid LAD.   [name] ILIAC ARTERY STENT     right iliac artery stent   . ORIF PATELLA Left 03/22/2016   Dr. Tristan Schroeder  . PARATHYROIDECTOMY  2012   [facility]  . SKIN BIOPSY     precancerous    Family History  Problem Relation Age of Onset  . Heart attack Father 43  . Alcohol abuse Father   . Heart disease Father   . Liver disease Father   . Hypertension Father   . Heart disease Sister 4       CAD; s/p stent placement  . Stroke Sister   . AAA (abdominal aortic aneurysm) Sister   . Dementia Sister   . Stroke Mother   . Hypertension Mother   . Diabetes Mother   . Stroke Maternal Grandmother   . Heart attack Maternal Grandfather   . Stroke Paternal Grandmother   . Breast cancer Maternal Aunt 65  . Breast cancer Paternal Aunt   . Bone cancer Paternal Uncle     Social History Social History   Tobacco Use  . Smoking status: Former Smoker    Packs/day:  1.00    Years: 42.00    Pack years: 42.00    Types: Cigarettes    Last attempt to quit: 08/27/2000    Years since quitting: 17.6  . Smokeless tobacco: Never Used  Substance Use Topics  . Alcohol use: No  . Drug use: No    Allergies  Allergen Reactions  . Crestor [Rosuvastatin] Nausea Only  . Cyclobenzaprine     Vertigo, couldn't focus eyes  . Erythromycin     GI upset   . Fosamax [Alendronate Sodium] Nausea And Vomiting    Current Outpatient Medications  Medication Sig Dispense Refill  . amLODipine (NORVASC) 10 MG tablet TAKE 1 TABLET BY MOUTH  DAILY 90 tablet 1  . aspirin 81 MG tablet Take 1 tablet (81 mg total) by mouth daily. (Patient taking differently: Take 81 mg by mouth at bedtime. ) 90 tablet 3  . atenolol (TENORMIN) 50 MG tablet Take 1 tablet (50 mg total) by mouth daily. 90 tablet 1  . atorvastatin (LIPITOR) 40 MG tablet Take 1 tablet (40 mg total) by mouth daily.  (Patient taking differently: Take 40 mg by mouth at bedtime. ) 90 tablet 1  . benazepril (LOTENSIN) 40 MG tablet Take 1 tablet (40 mg total) by mouth daily. 90 tablet 1  . clidinium-chlordiazePOXIDE (LIBRAX) 5-2.5 MG capsule TAKE 1 CAPSULE BY MOUTH TWICE DAILY FOR LUNCH/SUPPER. (Patient taking differently: Take 1 capsule by mouth daily. ) 180 capsule 1  . clobetasol ointment (TEMOVATE) 8.54 % Apply 1 application topically as needed (rash).   1  . clonazePAM (KLONOPIN) 0.5 MG tablet Take 1 tablet (0.5 mg total) by mouth at bedtime as needed. (Patient taking differently: Take 0.5 mg by mouth daily as needed for anxiety. ) 30 tablet 0  . diclofenac sodium (VOLTAREN) 1 % GEL Apply 2 g topically 4 (four) times daily. (Patient taking differently: Apply 1 application topically daily as needed (pain). ) 100 g 2  . diphenhydrAMINE (BENADRYL) 25 mg capsule Take 25 mg by mouth at bedtime.     . fluticasone (FLONASE) 50 MCG/ACT nasal spray USE 2 SPRAYS IN EACH  NOSTRIL DAILY 48 g 12  . hydrALAZINE (APRESOLINE) 25 MG tablet TAKE 1 TABLET BY MOUTH TWO  TIMES DAILY 180 tablet 1  . nystatin ointment (MYCOSTATIN) Apply 1 application topically 2 (two) times daily. (Patient not taking: Reported on 04/10/2018) 30 g 0  . pantoprazole (PROTONIX) 40 MG tablet Take 1 tablet (40 mg total) by mouth daily. (Patient taking differently: Take 40 mg by mouth at bedtime. ) 90 tablet 1  . ranitidine (ZANTAC) 300 MG tablet Take 1 tablet (300 mg total) by mouth 2 (two) times daily. 180 tablet 1  . sertraline (ZOLOFT) 50 MG tablet Take 1 tablet (50 mg total) by mouth daily. 90 tablet 1  . tiZANidine (ZANAFLEX) 4 MG tablet TAKE 1 TABLET(4 MG) BY MOUTH EVERY 6 HOURS AS NEEDED FOR MUSCLE SPASMS 30 tablet 3  . traMADol (ULTRAM) 50 MG tablet Take 1 tablet (50 mg total) by mouth every 8 (eight) hours as needed. (Patient taking differently: Take 50 mg by mouth every 8 (eight) hours as needed for moderate pain. ) 20 tablet 0  . acetaminophen  (TYLENOL) 650 MG CR tablet Take 1,950 mg by mouth daily as needed for pain.    [name] lidocaine-prilocaine (EMLA) cream Apply 1 application topically as needed. Apply to the areola and cover with plastic wrap one hour prior to leaving for surgery. 5 g 0  .  niacin 500 MG tablet Take 1,000 mg by mouth at bedtime.    . Olopatadine HCl 0.2 % SOLN Place 1 drop into the left eye daily.    . Turmeric 500 MG TABS Take 500 mg by mouth daily.     No current facility-administered medications for this visit.     Review of Systems Review of Systems  Constitutional: Negative.   Respiratory: Negative.   Cardiovascular: Negative.   Gastrointestinal: Positive for abdominal pain (ruq). Negative for abdominal distention, anal bleeding, blood in stool, constipation, diarrhea, nausea, rectal pain and vomiting.    Blood pressure 134/72, pulse 78, resp. rate 14, height 4' 10"  (1.473 m), weight 145 lb (65.8 kg).  Physical Exam Physical Exam  Constitutional: She is oriented to person, place, and time. She appears well-developed and well-nourished.  Eyes: Conjunctivae are normal. No scleral icterus.  Neck: Neck supple.  Cardiovascular: Normal rate, regular rhythm, normal heart sounds and intact distal pulses.  Pulmonary/Chest: Effort normal and breath sounds normal. Right breast exhibits no inverted nipple, no mass, no nipple discharge, no skin change and no tenderness. Left breast exhibits no inverted nipple, no mass, no nipple discharge, no skin change and no tenderness.  Abdominal: Soft. Normal appearance and bowel sounds are normal.  Lymphadenopathy:    She has no cervical adenopathy.    She has no axillary adenopathy.  Neurological: She is alert and oriented to person, place, and time.  Skin: Skin is warm and dry.  Psychiatric: She has a normal mood and affect.    Data Reviewed  Left breast diagnostic mammogram and ultrasound dated April 02, 2018: FINDINGS: 3D tomographic and 2D generated spot  compression views of the left breast demonstrate a small irregular mass with distortion and minimal microcalcification in the 12 to 12:30 o'clock position of the left breast. This is located 1 cm medial to a wing shaped biopsy marker clip placed at the time of biopsy of the fibroadenoma in 2014.  Targeted ultrasound is performed, showing an irregular hypoechoic mass with posterior acoustical shadowing in the 12 o'clock position of the left breast, 5 cm from the nipple. This measures 1.6 x 1.1 x 0.8 cm in maximum dimensions.  Ultrasound of the left axilla demonstrated normal appearing left axillary lymph nodes.  April 05, 2018 ultrasound-guided core biopsy of the left breast: Using sterile technique and 1% Lidocaine as local anesthetic, under direct ultrasound visualization, a 12 gauge spring-loaded device was used to perform biopsy of the LEFT breast mass at the 12 o'clock axisusing a lateral approach. At the conclusion of the procedure, a ribbon shaped tissue marker clip was deployed into the biopsy cavity.  Ribbon shaped clip is well-positioned at the site of the targeted mass in the LEFT breast at the 12 o'clock axis, corresponding to the original mammographic finding.  April 05, 2018 ultrasound-guided biopsy of the left breast: DIAGNOSIS:  A. BREAST, LEFT, 12 O'CLOCK; ULTRASOUND-GUIDED CORE BIOPSY:  - INVASIVE MAMMARY CARCINOMA, NO SPECIAL TYPE.   Size of invasive carcinoma: 9 mm in this sample  Histologic grade of invasive carcinoma: Grade 2            Glandular/tubular differentiation score: 3            Nuclear pleomorphism score: 2            Mitotic rate score: 1            Total score: 6  Ductal carcinoma in situ: Present, intermediate nuclear grade, no  necrosis,  associated with a few small calcifications  Lymphovascular invasion: Not identified  Estrogen Receptor (ER) Status: POSITIVE  Percentage of cells with nuclear  positivity: >90%  Average intensity of staining: Strong   Progesterone Receptor (PgR) Status: POSITIVE  Percentage of cells with nuclear positivity: >90%  Average intensity of staining: Strong   HER2 (by immunohistochemistry): NEGATIVE (score 1+)   February 20, 2018 abdominal ultrasound was reviewed: No evidence of cholelithiasis.  Small hepatic cyst.  Negative sonographic Murphy sign.  CBC and comprehensive metabolic panel of Dec 28, 1622 reviewed: Elevated fasting blood sugar 102.  Creatinine 0.88 with an estimated GFR of 67.  Normal electrolytes.  Liver function studies.  Hemoglobin 12.6 with an MCV of 89, platelet count of 233,000, blood cell count of 4900 with normal differential.   Chest CT of March 02, 2017 showed a stable 8 mm right lower lobe posterior nodule unchanged since July 2016.  Ultrasound examination of the left breast was undertaken to determine if preoperative radiologic localization would be required.  In the 12 o'clock position of the left breast 5 cm from the nipple adjacent to the underlying pectoralis fascia there is a 0.69 x 0.73 x 0.78 taller rather than wide hypoechoic mass consistent with the index lesion.  Adjacent to this is an area of distortion with a clip consistent of the biopsy site.  BI-RADS-6.  Assessment    Abdominal symptoms suggestive of chronic cholecystitis.  Negative ultrasound, HIDA with stimulation warranted.  Clinical stage I carcinoma of the left breast.      Plan    The majority of the visit was spent reviewing the options for breast cancer treatment. Breast conservation with lumpectomy and radiation therapy  was presented as equivalent to mastectomy for long-term control. The pros and cons of each treatment regimen were reviewed. The indications for additional therapy such as chemotherapy were touched on briefly, realizing that the majority of information required to determine if chemotherapy would be of benefit is not available at this time.   The availability of second surgical opinion locally or at [facility] is reviewed, presently declined.  The patient is convinced at this time that breast conservation is appropriate.  She was sent home with appropriate website and written literature to review.  We will arrange for a HIDA with stimulation.   HPI, Physical Exam, Assessment and Plan have been scribed under the direction and in the presence of Robert Bellow, MD Concepcion Living, LPN    I have completed the exam and reviewed the above documentation for accuracy and completeness.  I agree with the above.  Haematologist has been used and any errors in dictation or transcription are unintentional.  Hervey Ard, M.D., F.A.C.S.  The patient is scheduled for surgery at [facility] on 04/22/18. She will pre admit at the hospital on 04/16/18 at 11:15 am. She will report to the Radiology desk the morning of surgery at 9:45 am. She will make use of EMLA cream, apply to her left areola one hour prior to leaving for the hospital on 04/22/18. The patient is aware of dates, times, and instructions.  The patient is scheduled for a HIDA scan with stimulation at [facility] on 04/19/18 at 11:30 am. She will arrive by 11:00 am and have nothing to eat or drink for 6 hours prior. The patient is aware of date, time, and instructions.  Documented by Caryl-Lyn Otis Brace LPN   Forest Gleason Cammie Faulstich 04/10/2018, 4:36 PM

## 2018-04-09 NOTE — Patient Instructions (Addendum)
        Cholecystitis Cholecystitis is swelling and irritation (inflammation) of the gallbladder. The gallbladder is an organ that is shaped like a pear. It is under the liver on the right side of the body. This condition is often caused by gallstones. You doctor may do tests to see how your gallbladder works. These tests may include:  Imaging tests, such as: ? An ultrasound. ? MRI.  Tests that check how your liver works.  This condition needs treatment. Follow these instructions at home: Home care will depend on your treatment. In general:  Take over-the-counter and prescription medicines only as told by your doctor.  If you were prescribed an antibiotic medicine, take it as told by your doctor. Do not stop taking the antibiotic even if you start to feel better.  Follow instructions from your doctor about what to eat or drink. When you are allowed to eat, avoid eating or drinking anything that causes your symptoms to start.  Keep all follow-up visits as told by your doctor. This is important.  Contact a doctor if:  You have pain and your medicine does not help.  You have a fever. Get help right away if:  Your pain moves to: ? Another part of your belly (abdomen). ? Your back.  Your symptoms do not go away.  You have new symptoms. This information is not intended to replace advice given to you by your health care provider. Make sure you discuss any questions you have with your health care provider. Document Released: 08/03/2011 Document Revised: 01/20/2016 Document Reviewed: 11/25/2014 Elsevier Interactive Patient Education  [employer].   The patient is scheduled for surgery at [facility] on 04/22/18. She will pre admit at the hospital on 04/16/18. We will call the patient with her surgery arrival time and location and pre admit time and date. The patient is aware of date and instructions.  The patient is scheduled for a HIDA scan with stimulation at [facility] on 04/19/18 at  11:30 am. She will arrive by 11:00 am and have nothing to eat or drink for 6 hours prior. The patient is aware of date, time, and instructions.

## 2018-04-10 ENCOUNTER — Other Ambulatory Visit: Payer: Self-pay

## 2018-04-10 ENCOUNTER — Other Ambulatory Visit: Payer: Self-pay | Admitting: Anatomic Pathology & Clinical Pathology

## 2018-04-10 DIAGNOSIS — K819 Cholecystitis, unspecified: Secondary | ICD-10-CM

## 2018-04-10 DIAGNOSIS — R1011 Right upper quadrant pain: Secondary | ICD-10-CM

## 2018-04-10 DIAGNOSIS — C50812 Malignant neoplasm of overlapping sites of left female breast: Secondary | ICD-10-CM | POA: Insufficient documentation

## 2018-04-10 HISTORY — DX: Cholecystitis, unspecified: K81.9

## 2018-04-10 LAB — SURGICAL PATHOLOGY

## 2018-04-10 MED ORDER — LIDOCAINE-PRILOCAINE 2.5-2.5 % EX CREA: 1.0000 "application " | TOPICAL_CREAM | CUTANEOUS | 0 refills | Status: DC | PRN

## 2018-04-13 ENCOUNTER — Other Ambulatory Visit: Payer: Self-pay | Admitting: Family Medicine

## 2018-04-15 ENCOUNTER — Other Ambulatory Visit: Payer: Self-pay

## 2018-04-15 ENCOUNTER — Encounter
Admission: RE | Admit: 2018-04-15 | Discharge: 2018-04-15 | Disposition: A | Discharge status: Home / Self Care | Payer: Medicare Other | Source: Ambulatory Visit | Attending: General Surgery | Admitting: General Surgery

## 2018-04-15 DIAGNOSIS — I447 Left bundle-branch block, unspecified: Secondary | ICD-10-CM | POA: Insufficient documentation

## 2018-04-15 DIAGNOSIS — I2510 Atherosclerotic heart disease of native coronary artery without angina pectoris: Secondary | ICD-10-CM | POA: Insufficient documentation

## 2018-04-15 DIAGNOSIS — C50812 Malignant neoplasm of overlapping sites of left female breast: Secondary | ICD-10-CM | POA: Diagnosis not present

## 2018-04-15 DIAGNOSIS — Z01810 Encounter for preprocedural cardiovascular examination: Secondary | ICD-10-CM | POA: Diagnosis not present

## 2018-04-15 DIAGNOSIS — Z01818 Encounter for other preprocedural examination: Secondary | ICD-10-CM | POA: Insufficient documentation

## 2018-04-15 DIAGNOSIS — I10 Essential (primary) hypertension: Secondary | ICD-10-CM | POA: Diagnosis not present

## 2018-04-15 HISTORY — DX: Family history of other specified conditions: Z84.89

## 2018-04-15 LAB — BASIC METABOLIC PANEL
ANION GAP: 8 (ref 5–15)
BUN: 15 mg/dL (ref 8–23)
CO2: 26 mmol/L (ref 22–32)
Calcium: 8.7 mg/dL — ABNORMAL LOW (ref 8.9–10.3)
Chloride: 104 mmol/L (ref 98–111)
Creatinine, Ser: 0.94 mg/dL (ref 0.44–1.00)
Glucose, Bld: 93 mg/dL (ref 70–99)
Potassium: 3.7 mmol/L (ref 3.5–5.1)
Sodium: 138 mmol/L (ref 135–145)

## 2018-04-15 LAB — CBC
HCT: 35.4 % (ref 35.0–47.0)
HEMOGLOBIN: 11.9 g/dL — AB (ref 12.0–16.0)
MCH: 30.3 pg (ref 26.0–34.0)
MCHC: 33.6 g/dL (ref 32.0–36.0)
MCV: 90 fL (ref 80.0–100.0)
Platelets: 199 10*3/uL (ref 150–440)
RBC: 3.93 MIL/uL (ref 3.80–5.20)
RDW: 14.1 % (ref 11.5–14.5)
WBC: 4.8 10*3/uL (ref 3.6–11.0)

## 2018-04-15 NOTE — Patient Instructions (Signed)
Your procedure is scheduled on: Monday, April 22, 2018 Report to radiology department as instructed.  REMEMBER: Instructions that are not followed completely may result in serious medical risk, up to and including death; or upon the discretion of your surgeon and anesthesiologist your surgery may need to be rescheduled.  Do not eat food after midnight the night before surgery.  No gum chewing, lozengers or hard candies.  You may however, drink CLEAR liquids up to 2 hours before you are scheduled to arrive for your surgery. Do not drink anything within 2 hours of the start of your surgery.  Clear liquids include: - water  - apple juice without pulp - gatorade - black coffee or tea (Do NOT add milk or creamers to the coffee or tea) Do NOT drink anything that is not on this list.  No Alcohol for 24 hours before or after surgery.  No Smoking including e-cigarettes for 24 hours prior to surgery.  No chewable tobacco products for at least 6 hours prior to surgery.  No nicotine patches on the day of surgery.  On the morning of surgery brush your teeth with toothpaste and water, you may rinse your mouth with mouthwash if you wish. Do not swallow any toothpaste or mouthwash.  Notify your doctor if there is any change in your medical condition (cold, fever, infection).  Do not wear jewelry, make-up, hairpins, clips or nail polish.  Do not wear lotions, powders, or perfumes. You may wear deodorant.  Do not shave 48 hours prior to surgery.   Contacts and dentures may not be worn into surgery.  Do not bring valuables to the hospital, including drivers license, insurance or credit cards.  [facility] is not responsible for any belongings or valuables.   TAKE THESE MEDICATIONS THE MORNING OF SURGERY:  1.  Amlodipine 2.  Atenolol 3.  Ranitidine 4.  Sertraline 5.  Tramadol (if needed for pain)  Use CHG Soap as directed on instruction sheet.  NOW!  Stop ASPIRIN and  Anti-inflammatories (NSAIDS) such as Advil, Aleve, Ibuprofen, Motrin, Naproxen, Naprosyn and Aspirin based products such as Excedrin, Goodys Powder, BC Powder. (May take Tylenol or Acetaminophen if needed.)   NOW!  Stop ANY OVER THE COUNTER supplements until after surgery. (TURMERIC) (May continue Vitamin D, Vitamin B, and multivitamin.)  Wear comfortable clothing (specific to your surgery type) to the hospital.  Plan for stool softeners for home use.  If you are being discharged the day of surgery, you will not be allowed to drive home. You will need a responsible adult to drive you home and stay with you that night.   If you are taking public transportation, you will need to have a responsible adult with you. Please confirm with your physician that it is acceptable to use public transportation.   Please call [phone] if you have any questions about these instructions.

## 2018-04-16 ENCOUNTER — Telehealth: Payer: Self-pay

## 2018-04-16 ENCOUNTER — Inpatient Hospital Stay: Admission: RE | Admit: 2018-04-16 | Payer: Self-pay | Source: Ambulatory Visit

## 2018-04-16 NOTE — Pre-Procedure Instructions (Signed)
AS INSTRUCTED BY DR P CARROLL, REQUEST TO HAVE PCP REVIEW EKG CALLED AND FAXED TO MEGAN JOHNSON DO. ALSO FAXED FYI TO DR BYRNETT'S. SPOKE WITH ABBY AT [place]

## 2018-04-16 NOTE — Telephone Encounter (Signed)
Pre admit has requested that the patient have medical clearance due to some changes on her EKG. This request has been faxed to Dr Durenda Age office but Dr Wynetta Emery is out of the country at this time. They will see if one of the other providers can clear her or not.

## 2018-04-18 ENCOUNTER — Telehealth: Payer: Self-pay | Admitting: Family Medicine

## 2018-04-18 ENCOUNTER — Other Ambulatory Visit: Payer: Self-pay

## 2018-04-18 NOTE — Telephone Encounter (Signed)
We can review the recent EKG but she will need an office visit for surgical clearance. Does appear she had a stress test with Dr. Fletcher Anon 12/28/2016.

## 2018-04-18 NOTE — Telephone Encounter (Signed)
Called pt to schedule her surgery clearance. She stated that she had already had an EKG and would like to know if that one can be used instead of coming back in for another appt.

## 2018-04-18 NOTE — Pre-Procedure Instructions (Signed)
Spoke with Marcie Bal at Dr. Bard Herbert' office. They are trying to reach patient to schedule an appt with her, hopefully for tomorrow.

## 2018-04-19 ENCOUNTER — Encounter: Payer: Self-pay | Admitting: Unknown Physician Specialty

## 2018-04-19 ENCOUNTER — Ambulatory Visit (INDEPENDENT_AMBULATORY_CARE_PROVIDER_SITE_OTHER): Payer: Medicare Other | Admitting: Unknown Physician Specialty

## 2018-04-19 ENCOUNTER — Encounter
Admission: RE | Admit: 2018-04-19 | Discharge: 2018-04-19 | Disposition: A | Discharge status: Home / Self Care | Payer: Medicare Other | Source: Ambulatory Visit | Attending: General Surgery | Admitting: General Surgery

## 2018-04-19 VITALS — BP 123/80 | HR 86 | Temp 98.5°F | Ht <= 58 in | Wt 145.8 lb

## 2018-04-19 DIAGNOSIS — I2510 Atherosclerotic heart disease of native coronary artery without angina pectoris: Secondary | ICD-10-CM | POA: Diagnosis not present

## 2018-04-19 DIAGNOSIS — I129 Hypertensive chronic kidney disease with stage 1 through stage 4 chronic kidney disease, or unspecified chronic kidney disease: Secondary | ICD-10-CM

## 2018-04-19 DIAGNOSIS — Z01818 Encounter for other preprocedural examination: Secondary | ICD-10-CM

## 2018-04-19 DIAGNOSIS — R1011 Right upper quadrant pain: Secondary | ICD-10-CM | POA: Diagnosis not present

## 2018-04-19 DIAGNOSIS — K819 Cholecystitis, unspecified: Secondary | ICD-10-CM | POA: Insufficient documentation

## 2018-04-19 DIAGNOSIS — R109 Unspecified abdominal pain: Secondary | ICD-10-CM | POA: Diagnosis not present

## 2018-04-19 MED ORDER — TECHNETIUM TC 99M MEBROFENIN IV KIT
5.4520 | PACK | Freq: Once | INTRAVENOUS | Status: AC | PRN
  Administered 2018-04-19: 5.452 via INTRAVENOUS

## 2018-04-19 NOTE — Assessment & Plan Note (Signed)
Not to goal but OK for surgery

## 2018-04-19 NOTE — Pre-Procedure Instructions (Addendum)
Spoke to patient. She has appt. Today at 3 pm at PCP office for clearance

## 2018-04-19 NOTE — Assessment & Plan Note (Signed)
No ischemia after reviewing note with Dr Fletcher Anon

## 2018-04-19 NOTE — Progress Notes (Signed)
BP 123/80 (BP Location: Left Arm, Cuff Size: Normal)   Pulse 86   Temp 98.5 F (36.9 C) (Oral)   Ht 4\' 10"  (1.473 m)   Wt 145 lb 12.8 oz (66.1 kg)   SpO2 97%   BMI 30.47 kg/m    Subjective:    Patient ID: Marie Parker, female    DOB: 1945-11-07, 72 y.o.   MRN: [id number]  HPI: Marie Parker is a 73 y.o. female  Chief Complaint  Patient presents with  . Surgery Clearance   Pt is due for a breast partial mastectomy secondary to a positive biopsy.  Pre-op EKG was abnormal with chronic left BBB.  Noted a stress test done by Dr. Fletcher Anon 12/2016 showing no ischemia.  She can climb 2 flights of stairs without SOB.    BP is up because she has not taken her medication today as she has not "had a chance."    CBC and CMP done.  These show a mildly low Hgb and Calcium.  Neither should interfere with her surgery  Relevant past medical, surgical, family and social history reviewed and updated as indicated. Interim medical history since our last visit reviewed. Allergies and medications reviewed and updated.  Review of Systems  Constitutional: Negative.   HENT: Negative.   Respiratory: Negative.   Cardiovascular: Negative.   Gastrointestinal: Positive for diarrhea. Negative for vomiting.  Psychiatric/Behavioral: Negative.     Per HPI unless specifically indicated above     Objective:    BP 123/80 (BP Location: Left Arm, Cuff Size: Normal)   Pulse 86   Temp 98.5 F (36.9 C) (Oral)   Ht 4\' 10"  (1.473 m)   Wt 145 lb 12.8 oz (66.1 kg)   SpO2 97%   BMI 30.47 kg/m   Wt Readings from Last 3 Encounters:  04/19/18 145 lb 12.8 oz (66.1 kg)  04/15/18 147 lb (66.7 kg)  04/09/18 145 lb (65.8 kg)    Physical Exam  Constitutional: She is oriented to person, place, and time. She appears well-developed and well-nourished. No distress.  HENT:  Head: Normocephalic and atraumatic.  Eyes: Conjunctivae and lids are normal. Right eye exhibits no discharge. Left eye exhibits no discharge.  No scleral icterus.  Neck: Normal range of motion. Neck supple. No JVD present. Carotid bruit is not present.  Cardiovascular: Normal rate, regular rhythm and normal heart sounds.  Pulmonary/Chest: Effort normal and breath sounds normal.  Abdominal: Soft. Normal appearance. She exhibits no distension. There is no splenomegaly or hepatomegaly. There is no tenderness. There is no rebound and no guarding.  Musculoskeletal: Normal range of motion.  Neurological: She is alert and oriented to person, place, and time.  Skin: Skin is warm, dry and intact. No rash noted. No pallor.  Psychiatric: She has a normal mood and affect. Her behavior is normal. Judgment and thought content normal.   EKG reviewed and without significant change for previous EKG.  Stress test was normal in 12/2016  Results for orders placed or performed during the hospital encounter of 56/31/49  Basic metabolic panel  Result Value Ref Range   Sodium 138 135 - 145 mmol/L   Potassium 3.7 3.5 - 5.1 mmol/L   Chloride 104 98 - 111 mmol/L   CO2 26 22 - 32 mmol/L   Glucose, Bld 93 70 - 99 mg/dL   BUN 15 8 - 23 mg/dL   Creatinine, Ser 0.94 0.44 - 1.00 mg/dL   Calcium 8.7 (L) 8.9 - 10.3 mg/dL  GFR calc non Af Amer >60 >60 mL/min   GFR calc Af Amer >60 >60 mL/min   Anion gap 8 5 - 15  CBC  Result Value Ref Range   WBC 4.8 3.6 - 11.0 K/uL   RBC 3.93 3.80 - 5.20 MIL/uL   Hemoglobin 11.9 (L) 12.0 - 16.0 g/dL   HCT 35.4 35.0 - 47.0 %   MCV 90.0 80.0 - 100.0 fL   MCH 30.3 26.0 - 34.0 pg   MCHC 33.6 32.0 - 36.0 g/dL   RDW 14.1 11.5 - 14.5 %   Platelets 199 150 - 440 K/uL      Assessment & Plan:   Problem List Items Addressed This Visit      Unprioritized   Benign hypertensive renal disease    Not to goal but OK for surgery      CAD (coronary artery disease)    No ischemia after reviewing note with Dr Fletcher Anon       Other Visit Diagnoses    Pre-operative clearance    -  Primary   OK for surgery.         Follow up  plan: Return if symptoms worsen or fail to improve.

## 2018-04-22 ENCOUNTER — Encounter
Admission: RE | Disposition: A | Discharge status: Home / Self Care | Payer: Self-pay | Source: Ambulatory Visit | Attending: General Surgery

## 2018-04-22 ENCOUNTER — Telehealth: Payer: Self-pay | Admitting: Family Medicine

## 2018-04-22 ENCOUNTER — Other Ambulatory Visit: Payer: Self-pay

## 2018-04-22 ENCOUNTER — Ambulatory Visit: Payer: Medicare Other | Admitting: Anesthesiology

## 2018-04-22 ENCOUNTER — Ambulatory Visit
Admission: RE | Admit: 2018-04-22 | Discharge: 2018-04-22 | Disposition: A | Discharge status: Home / Self Care | Payer: Medicare Other | Source: Ambulatory Visit | Attending: General Surgery | Admitting: General Surgery

## 2018-04-22 ENCOUNTER — Encounter
Admission: RE | Admit: 2018-04-22 | Discharge: 2018-04-22 | Disposition: A | Discharge status: Home / Self Care | Payer: Medicare Other | Source: Ambulatory Visit | Attending: General Surgery | Admitting: General Surgery

## 2018-04-22 ENCOUNTER — Ambulatory Visit: Payer: Medicare Other

## 2018-04-22 DIAGNOSIS — I2510 Atherosclerotic heart disease of native coronary artery without angina pectoris: Secondary | ICD-10-CM | POA: Insufficient documentation

## 2018-04-22 DIAGNOSIS — N189 Chronic kidney disease, unspecified: Secondary | ICD-10-CM | POA: Insufficient documentation

## 2018-04-22 DIAGNOSIS — F329 Major depressive disorder, single episode, unspecified: Secondary | ICD-10-CM | POA: Diagnosis not present

## 2018-04-22 DIAGNOSIS — C50912 Malignant neoplasm of unspecified site of left female breast: Secondary | ICD-10-CM | POA: Diagnosis not present

## 2018-04-22 DIAGNOSIS — N6092 Unspecified benign mammary dysplasia of left breast: Secondary | ICD-10-CM | POA: Insufficient documentation

## 2018-04-22 DIAGNOSIS — C50812 Malignant neoplasm of overlapping sites of left female breast: Secondary | ICD-10-CM

## 2018-04-22 DIAGNOSIS — Z853 Personal history of malignant neoplasm of breast: Secondary | ICD-10-CM

## 2018-04-22 DIAGNOSIS — M159 Polyosteoarthritis, unspecified: Secondary | ICD-10-CM | POA: Diagnosis not present

## 2018-04-22 DIAGNOSIS — C50512 Malignant neoplasm of lower-outer quadrant of left female breast: Secondary | ICD-10-CM | POA: Diagnosis not present

## 2018-04-22 DIAGNOSIS — I739 Peripheral vascular disease, unspecified: Secondary | ICD-10-CM | POA: Diagnosis not present

## 2018-04-22 DIAGNOSIS — M810 Age-related osteoporosis without current pathological fracture: Secondary | ICD-10-CM | POA: Diagnosis not present

## 2018-04-22 DIAGNOSIS — Z7982 Long term (current) use of aspirin: Secondary | ICD-10-CM | POA: Diagnosis not present

## 2018-04-22 DIAGNOSIS — C50412 Malignant neoplasm of upper-outer quadrant of left female breast: Secondary | ICD-10-CM

## 2018-04-22 DIAGNOSIS — E785 Hyperlipidemia, unspecified: Secondary | ICD-10-CM | POA: Diagnosis not present

## 2018-04-22 DIAGNOSIS — F419 Anxiety disorder, unspecified: Secondary | ICD-10-CM | POA: Insufficient documentation

## 2018-04-22 DIAGNOSIS — K219 Gastro-esophageal reflux disease without esophagitis: Secondary | ICD-10-CM | POA: Diagnosis not present

## 2018-04-22 DIAGNOSIS — I129 Hypertensive chronic kidney disease with stage 1 through stage 4 chronic kidney disease, or unspecified chronic kidney disease: Secondary | ICD-10-CM | POA: Insufficient documentation

## 2018-04-22 DIAGNOSIS — Z888 Allergy status to other drugs, medicaments and biological substances status: Secondary | ICD-10-CM | POA: Diagnosis not present

## 2018-04-22 DIAGNOSIS — Z881 Allergy status to other antibiotic agents status: Secondary | ICD-10-CM | POA: Diagnosis not present

## 2018-04-22 DIAGNOSIS — Z79899 Other long term (current) drug therapy: Secondary | ICD-10-CM | POA: Insufficient documentation

## 2018-04-22 HISTORY — PX: MASTECTOMY, PARTIAL: SHX709

## 2018-04-22 HISTORY — DX: Malignant neoplasm of upper-outer quadrant of left female breast: C50.412

## 2018-04-22 HISTORY — PX: SENTINEL NODE BIOPSY: SHX6608

## 2018-04-22 SURGERY — MASTECTOMY PARTIAL
Anesthesia: General | Site: Breast | Laterality: Left | Wound class: Clean

## 2018-04-22 MED ORDER — CELECOXIB 200 MG PO CAPS
ORAL_CAPSULE | ORAL | Status: AC
  Filled 2018-04-22: qty 1

## 2018-04-22 MED ORDER — OXYCODONE HCL 5 MG/5ML PO SOLN: 5.0000 mg | Freq: Once | ORAL | Status: DC | PRN

## 2018-04-22 MED ORDER — METHYLENE BLUE 0.5 % INJ SOLN
INTRAVENOUS | Status: AC
  Filled 2018-04-22: qty 10

## 2018-04-22 MED ORDER — LACTATED RINGERS IV SOLN
INTRAVENOUS | Status: DC
  Administered 2018-04-22: 11:00:00 via INTRAVENOUS

## 2018-04-22 MED ORDER — METHYLENE BLUE 0.5 % INJ SOLN
INTRAVENOUS | Status: DC | PRN
  Administered 2018-04-22: 5 mL

## 2018-04-22 MED ORDER — OXYCODONE HCL 5 MG PO TABS: 5.0000 mg | ORAL_TABLET | Freq: Once | ORAL | Status: DC | PRN

## 2018-04-22 MED ORDER — FENTANYL CITRATE (PF) 100 MCG/2ML IJ SOLN
INTRAMUSCULAR | Status: AC
  Filled 2018-04-22: qty 2

## 2018-04-22 MED ORDER — ACETAMINOPHEN 10 MG/ML IV SOLN
INTRAVENOUS | Status: DC | PRN
  Administered 2018-04-22: 1000 mg via INTRAVENOUS

## 2018-04-22 MED ORDER — PROPOFOL 10 MG/ML IV BOLUS
INTRAVENOUS | Status: AC
  Filled 2018-04-22: qty 20

## 2018-04-22 MED ORDER — BUPIVACAINE-EPINEPHRINE 0.5% -1:200000 IJ SOLN
INTRAMUSCULAR | Status: DC | PRN
  Administered 2018-04-22: 30 mL

## 2018-04-22 MED ORDER — KETOROLAC TROMETHAMINE 30 MG/ML IJ SOLN
INTRAMUSCULAR | Status: DC | PRN
  Administered 2018-04-22: 30 mg via INTRAVENOUS

## 2018-04-22 MED ORDER — GABAPENTIN 300 MG PO CAPS
ORAL_CAPSULE | ORAL | Status: AC
  Filled 2018-04-22: qty 1

## 2018-04-22 MED ORDER — TECHNETIUM TC 99M SULFUR COLLOID FILTERED
1.1700 | Freq: Once | INTRAVENOUS | Status: AC | PRN
  Administered 2018-04-22: 1.17 via INTRADERMAL

## 2018-04-22 MED ORDER — EPHEDRINE SULFATE 50 MG/ML IJ SOLN
INTRAMUSCULAR | Status: DC | PRN
  Administered 2018-04-22: 10 mg via INTRAVENOUS

## 2018-04-22 MED ORDER — ONDANSETRON HCL 4 MG/2ML IJ SOLN
INTRAMUSCULAR | Status: DC | PRN
  Administered 2018-04-22: 4 mg via INTRAVENOUS

## 2018-04-22 MED ORDER — FENTANYL CITRATE (PF) 100 MCG/2ML IJ SOLN
INTRAMUSCULAR | Status: AC
  Administered 2018-04-22: 50 ug via INTRAVENOUS
  Filled 2018-04-22: qty 2

## 2018-04-22 MED ORDER — BUPIVACAINE-EPINEPHRINE (PF) 0.5% -1:200000 IJ SOLN
INTRAMUSCULAR | Status: AC
  Filled 2018-04-22: qty 30

## 2018-04-22 MED ORDER — FENTANYL CITRATE (PF) 100 MCG/2ML IJ SOLN
INTRAMUSCULAR | Status: DC | PRN
  Administered 2018-04-22 (×4): 25 ug via INTRAVENOUS

## 2018-04-22 MED ORDER — FENTANYL CITRATE (PF) 100 MCG/2ML IJ SOLN
25.0000 ug | INTRAMUSCULAR | Status: DC | PRN
  Administered 2018-04-22: 25 ug via INTRAVENOUS
  Administered 2018-04-22: 50 ug via INTRAVENOUS

## 2018-04-22 MED ORDER — MIDAZOLAM HCL 2 MG/2ML IJ SOLN
INTRAMUSCULAR | Status: AC
  Filled 2018-04-22: qty 2

## 2018-04-22 MED ORDER — MIDAZOLAM HCL 2 MG/2ML IJ SOLN
INTRAMUSCULAR | Status: DC | PRN
  Administered 2018-04-22: 2 mg via INTRAVENOUS

## 2018-04-22 MED ORDER — TRAMADOL HCL 50 MG PO TABS: 50.0000 mg | ORAL_TABLET | ORAL | 0 refills | Status: DC | PRN

## 2018-04-22 MED ORDER — CELECOXIB 200 MG PO CAPS
200.0000 mg | ORAL_CAPSULE | ORAL | Status: AC
  Administered 2018-04-22: 200 mg via ORAL

## 2018-04-22 MED ORDER — PROPOFOL 10 MG/ML IV BOLUS
INTRAVENOUS | Status: DC | PRN
  Administered 2018-04-22: 100 mg via INTRAVENOUS

## 2018-04-22 MED ORDER — GABAPENTIN 300 MG PO CAPS
300.0000 mg | ORAL_CAPSULE | ORAL | Status: AC
  Administered 2018-04-22: 300 mg via ORAL

## 2018-04-22 SURGICAL SUPPLY — 62 items
BANDAGE ELASTIC 6 LF NS (GAUZE/BANDAGES/DRESSINGS) ×3 IMPLANT
BINDER BREAST LRG (GAUZE/BANDAGES/DRESSINGS) IMPLANT
BINDER BREAST MEDIUM (GAUZE/BANDAGES/DRESSINGS) IMPLANT
BINDER BREAST XLRG (GAUZE/BANDAGES/DRESSINGS) ×3 IMPLANT
BINDER BREAST XXLRG (GAUZE/BANDAGES/DRESSINGS) IMPLANT
BLADE PHOTON ILLUMINATED (MISCELLANEOUS) IMPLANT
BLADE SURG 15 STRL SS SAFETY (BLADE) ×6 IMPLANT
BNDG GAUZE 4.5X4.1 6PLY STRL (MISCELLANEOUS) ×3 IMPLANT
BULB RESERV EVAC DRAIN JP 100C (MISCELLANEOUS) IMPLANT
CANISTER SUCT 1200ML W/VALVE (MISCELLANEOUS) ×3 IMPLANT
CHLORAPREP W/TINT 26ML (MISCELLANEOUS) ×3 IMPLANT
CLOSURE WOUND 1/2 X4 (GAUZE/BANDAGES/DRESSINGS) ×1
CNTNR SPEC 2.5X3XGRAD LEK (MISCELLANEOUS) ×1
CONT SPEC 4OZ STER OR WHT (MISCELLANEOUS) ×2
CONTAINER SPEC 2.5X3XGRAD LEK (MISCELLANEOUS) ×1 IMPLANT
COVER PROBE FLX POLY STRL (MISCELLANEOUS) ×3 IMPLANT
DEVICE DUBIN SPECIMEN MAMMOGRA (MISCELLANEOUS) ×3 IMPLANT
DRAIN CHANNEL JP 15F RND 16 (MISCELLANEOUS) IMPLANT
DRAPE LAPAROTOMY 100X77 ABD (DRAPES) ×3 IMPLANT
DRAPE LAPAROTOMY TRNSV 106X77 (MISCELLANEOUS) ×3 IMPLANT
DRSG GAUZE FLUFF 36X18 (GAUZE/BANDAGES/DRESSINGS) ×3 IMPLANT
DRSG TELFA 3X8 NADH (GAUZE/BANDAGES/DRESSINGS) ×3 IMPLANT
DRSG TELFA 4X3 1S NADH ST (GAUZE/BANDAGES/DRESSINGS) ×6 IMPLANT
ELECT CAUTERY BLADE TIP 2.5 (TIP) ×3
ELECT REM PT RETURN 9FT ADLT (ELECTROSURGICAL) ×3
ELECTRODE CAUTERY BLDE TIP 2.5 (TIP) ×1 IMPLANT
ELECTRODE REM PT RTRN 9FT ADLT (ELECTROSURGICAL) ×1 IMPLANT
GAUZE SPONGE 4X4 12PLY STRL (GAUZE/BANDAGES/DRESSINGS) ×3 IMPLANT
GLOVE BIO SURGEON STRL SZ7.5 (GLOVE) ×3 IMPLANT
GLOVE INDICATOR 8.0 STRL GRN (GLOVE) ×3 IMPLANT
GOWN STRL REUS W/ TWL LRG LVL3 (GOWN DISPOSABLE) ×2 IMPLANT
GOWN STRL REUS W/TWL LRG LVL3 (GOWN DISPOSABLE) ×4
KIT TURNOVER KIT A (KITS) ×3 IMPLANT
LABEL OR SOLS (LABEL) ×3 IMPLANT
MARGIN MAP 10MM (MISCELLANEOUS) ×3 IMPLANT
NDL SAFETY ECLIPSE 18X1.5 (NEEDLE) ×1 IMPLANT
NEEDLE HYPO 18GX1.5 SHARP (NEEDLE) ×2
NEEDLE HYPO 22GX1.5 SAFETY (NEEDLE) ×3 IMPLANT
NEEDLE HYPO 25X1 1.5 SAFETY (NEEDLE) ×6 IMPLANT
PACK BASIN MINOR ARMC (MISCELLANEOUS) ×3 IMPLANT
PAD ABD DERMACEA PRESS 5X9 (GAUZE/BANDAGES/DRESSINGS) ×3 IMPLANT
RETRACTOR RING XSMALL (MISCELLANEOUS) ×1 IMPLANT
RTRCTR WOUND ALEXIS 13CM XS SH (MISCELLANEOUS) ×3
SHEARS FOC LG CVD HARMONIC 17C (MISCELLANEOUS) IMPLANT
SHEARS HARMONIC 9CM CVD (BLADE) ×3 IMPLANT
SLEVE PROBE SENORX GAMMA FIND (MISCELLANEOUS) ×3 IMPLANT
STRIP CLOSURE SKIN 1/2X4 (GAUZE/BANDAGES/DRESSINGS) ×2 IMPLANT
SUT ETHILON 3-0 FS-10 30 BLK (SUTURE) ×3
SUT SILK 2 0 (SUTURE) ×2
SUT SILK 2-0 18XBRD TIE 12 (SUTURE) ×1 IMPLANT
SUT VIC AB 2-0 CT1 (SUTURE) ×3 IMPLANT
SUT VIC AB 2-0 CT1 27 (SUTURE) ×2
SUT VIC AB 2-0 CT1 TAPERPNT 27 (SUTURE) ×1 IMPLANT
SUT VIC AB 4-0 FS2 27 (SUTURE) ×6 IMPLANT
SUT VICRYL+ 3-0 144IN (SUTURE) ×3 IMPLANT
SUTURE EHLN 3-0 FS-10 30 BLK (SUTURE) ×1 IMPLANT
SWABSTK COMLB BENZOIN TINCTURE (MISCELLANEOUS) ×3 IMPLANT
SYR 10ML LL (SYRINGE) ×3 IMPLANT
SYR BULB IRRIG 60ML STRL (SYRINGE) ×3 IMPLANT
SYR CONTROL 10ML (SYRINGE) ×3 IMPLANT
TAPE TRANSPORE STRL 2 31045 (GAUZE/BANDAGES/DRESSINGS) ×3 IMPLANT
WATER STERILE IRR 1000ML POUR (IV SOLUTION) ×3 IMPLANT

## 2018-04-22 NOTE — Anesthesia Preprocedure Evaluation (Signed)
Anesthesia Evaluation  Patient identified by MRN, date of birth, ID band Patient awake    Reviewed: Allergy & Precautions, H&P , NPO status , Patient's Chart, lab work & pertinent test results  History of Anesthesia Complications (+) Family history of anesthesia reaction and history of anesthetic complications  Airway Mallampati: III  TM Distance: >3 FB Neck ROM: limited    Dental  (+) Upper Dentures, Lower Dentures, Poor Dentition   Pulmonary neg shortness of breath, former smoker,           Cardiovascular Exercise Tolerance: Good hypertension, (-) angina+ CAD and + Peripheral Vascular Disease  (-) Past MI and (-) DOE      Neuro/Psych PSYCHIATRIC DISORDERS Anxiety Depression negative neurological ROS     GI/Hepatic negative GI ROS, Neg liver ROS, GERD  ,  Endo/Other  negative endocrine ROS  Renal/GU Renal disease     Musculoskeletal  (+) Arthritis ,   Abdominal   Peds  Hematology negative hematology ROS (+)   Anesthesia Other Findings Past Medical History: No date: Anxiety No date: Arthritis No date: CAD (coronary artery disease) No date: Cancer (HCC)     Comment:  squamous cell No date: Chronic kidney disease No date: Depression No date: Family history of adverse reaction to anesthesia     Comment:  mother had N/V No date: Generalized osteoarthritis No date: GERD (gastroesophageal reflux disease) No date: Gout 11/2012: History of endoscopy     Comment:  Gastritis No date: Hyperlipidemia No date: Hypertension No date: IFG (impaired fasting glucose) No date: Insomnia No date: Menopause No date: Osteoporosis No date: Parathyroid adenoma 02/02/2016: Personal history of tobacco use, presenting hazards to  health No date: PVD (peripheral vascular disease) ([place]) No date: PVD (peripheral vascular disease) ([place]) No date: Rotator cuff syndrome of shoulder and allied disorders No date: Tobacco abuse  Past  Surgical History: 2014: BREAST BIOPSY; Left     Comment:  neg core 04/05/2018: BREAST BIOPSY; Left 1970's: BREAST EXCISIONAL BIOPSY; Left     Comment:  neg No date: BREAST FIBROADENOMA SURGERY 09/17/2007: CARDIAC CATHETERIZATION     Comment:  Insignificant CAD with 40-50% stenosis of mid LAD.  No date: ILIAC ARTERY STENT     Comment:  right iliac artery stent  03/22/2016: ORIF PATELLA; Left     Comment:  Dr. Tristan Schroeder 2012: PARATHYROIDECTOMY     Comment:  [facility] No date: SKIN BIOPSY     Comment:  precancerous  BMI    Body Mass Index:  30.47 kg/m      Reproductive/Obstetrics negative OB ROS                             Anesthesia Physical Anesthesia Plan  ASA: III  Anesthesia Plan: General LMA   Post-op Pain Management:    Induction: Intravenous  PONV Risk Score and Plan: Ondansetron, Dexamethasone, Midazolam and Treatment may vary due to age or medical condition  Airway Management Planned: LMA  Additional Equipment:   Intra-op Plan:   Post-operative Plan: Extubation in OR  Informed Consent: I have reviewed the patients History and Physical, chart, labs and discussed the procedure including the risks, benefits and alternatives for the proposed anesthesia with the patient or authorized representative who has indicated his/her understanding and acceptance.   Dental Advisory Given  Plan Discussed with: Anesthesiologist, CRNA and Surgeon  Anesthesia Plan Comments: (Patient consented for risks of anesthesia including but not limited to:  - adverse  reactions to medications - damage to teeth, lips or other oral mucosa - sore throat or hoarseness - Damage to heart, brain, lungs or loss of life  Patient voiced understanding.)        Anesthesia Quick Evaluation

## 2018-04-22 NOTE — H&P (Signed)
No change in clinical condition or exam.  For management of left breast cancer.

## 2018-04-22 NOTE — Op Note (Signed)
Preoperative diagnosis: Left breast cancer, upper outer quadrant.  Postoperative diagnosis: Same.  Operative procedure: Ultrasound-guided left breast wide excision, tissue transfer and sentinel node biopsy.  Operating Surgeon: Hervey Ard, MD.  Anesthesia: General by LMA, Marcaine 0.5% with 1 to 200,000 units of epinephrine, 30 cc.  Estimated blood loss: 10 cc.  Clinical note: This 72 year old woman was noted to have a new density on screening mammogram and subsequent ultrasound-guided core biopsy showed a 9 mm invasive mammary carcinoma.  She desired breast conservation.  She underwent injection with technetium sulfur colloid prior to the procedure.  Operative note: The patient underwent general anesthesia without difficulty.  5 cc of 0.5% methylene blue was placed in the subareolar plexus.  The left breast and chest was then prepped with ChloraPrep and draped.  Ultrasound was used to confirm location of the mass at the 07-1229 o'clock position of the left breast about 5 cm from the nipple.  The local anesthetic was infiltrated and a curvilinear incision from the 11 to 2 o'clock position made.  The adipose tissue was elevated off the breast parenchyma in the proximal depth of 1.2 cm and an Hana wound protector placed.  A block of tissue 3 x 4 x 4 cm including the pectoralis fascia was then excised.  Specimen radiograph showed the clip towards the medial side and the pathologist reported the tumor appeared to abut this area.  The deep margin was the pectoralis fascia.  While the breast specimen was being processed attention was turned to the axilla.  The node seeker device was used to identify area of increased uptake in the lower to middle third of the axilla.  Local anesthetic was infiltrated.  A transverse incision was made.  The skin subtendinous tissue was divided and the [place] wound protector placed.  The axillary envelope was opened and the first hot blue node had counts of 11,000, the  second node had counts of 5000 and the third hot blue node had counts of 600.  These were sent in formalin for routine histology.  Palpation showed no additional palpable adenopathy.  The axillary envelope was closed with 2-0 Vicryl figure-of-eight sutures.  The adipose tissue was approximated similar fashion.  The skin was closed with a running 4-0 Vicryl subcuticular suture.  1 the specimen radiograph was reviewed it was elected to resect an additional centimeter of the medial margin.  This was done including the pectoralis fascia.  The specimen was orientated with the new medial margin on the Telfa pad, a skin for the most superficial portion of the breast tissue, the deep marker was placed on the pectoralis fascia and the caudal marker was placed inferiorly.  This was sent in formalin for routine histology.  The breast was then elevated circumferentially off the pectoralis muscle.  This was done for a distance of 5 to 6 cm.  The pectoralis fascia was then able to be approximated transversely with an interrupted 2-0 Vicryl figure-of-eight sutures.  This brought the breast parenchyma together very nicely.  The superficial aspect of the breast parenchyma was then approximated with an additional row of 2-0 Vicryl sutures.  The adipose layer was approximated with a running 2-0 Vicryl suture and the skin closed with a running 4-0 Vicryl subcuticular suture.  Benzoin Steri-Strips followed by fluff gauze, and ABD pad and a compressive binder were applied.  The patient tolerated the procedure well and was taken to recovery room in stable condition.

## 2018-04-22 NOTE — Transfer of Care (Signed)
Immediate Anesthesia Transfer of Care Note  Patient: Marie Parker  Procedure(s) Performed: MASTECTOMY PARTIAL (Left Breast) SENTINEL NODE BIOPSY (Left Breast)  Patient Location: PACU  Anesthesia Type:General  Level of Consciousness: awake and alert   Airway & Oxygen Therapy: Patient Spontanous Breathing and Patient connected to face mask oxygen  Post-op Assessment: Report given to RN and Post -op Vital signs reviewed and stable  Post vital signs: Reviewed and stable  Last Vitals:  Vitals Value Taken Time  BP 141/81 04/22/2018  3:26 PM  Temp    Pulse 86 04/22/2018  3:26 PM  Resp 13 04/22/2018  3:26 PM  SpO2 94 % 04/22/2018  3:26 PM    Last Pain:  Vitals:   04/22/18 1040  TempSrc: Oral  PainSc: 0-No pain         Complications: No apparent anesthesia complications

## 2018-04-22 NOTE — Telephone Encounter (Signed)
Looks like she got clearance on 8/23.

## 2018-04-22 NOTE — Anesthesia Post-op Follow-up Note (Signed)
Anesthesia QCDR form completed.        

## 2018-04-22 NOTE — Anesthesia Procedure Notes (Signed)
Procedure Name: LMA Insertion Performed by: Gentry Fitz, CRNA Pre-anesthesia Checklist: Patient identified, Emergency Drugs available, Suction available and Patient being monitored Patient Re-evaluated:Patient Re-evaluated prior to induction Oxygen Delivery Method: Circle system utilized Preoxygenation: Pre-oxygenation with 100% oxygen Induction Type: IV induction Ventilation: Mask ventilation without difficulty LMA: LMA inserted LMA Size: 4.0

## 2018-04-22 NOTE — Anesthesia Postprocedure Evaluation (Signed)
Anesthesia Post Note  Patient: Marie Parker  Procedure(s) Performed: MASTECTOMY PARTIAL (Left Breast) SENTINEL NODE BIOPSY (Left Breast)  Patient location during evaluation: PACU Anesthesia Type: General Level of consciousness: awake and alert Pain management: pain level controlled Vital Signs Assessment: post-procedure vital signs reviewed and stable Respiratory status: spontaneous breathing, nonlabored ventilation, respiratory function stable and patient connected to nasal cannula oxygen Cardiovascular status: blood pressure returned to baseline and stable Postop Assessment: no apparent nausea or vomiting Anesthetic complications: no     Last Vitals:  Vitals:   04/22/18 1611 04/22/18 1624  BP: 123/61 118/64  Pulse: 61 64  Resp: 18 18  Temp: 36.6 C   SpO2: 100% 100%    Last Pain:  Vitals:   04/22/18 1611  TempSrc: Temporal  PainSc: 0-No pain                 Brieann Osinski S

## 2018-04-22 NOTE — Telephone Encounter (Signed)
Copied from [place] [id number]. Topic: Inquiry >> Apr 18, 2018  3:10 PM Berneta Levins wrote: Reason for CRM:  Hoyle Sauer with [facility] calling to find out if clearance has been granted for pt yet.  Surgery is scheduled for 08/26. Hoyle Sauer can be reached at [phone]

## 2018-04-23 ENCOUNTER — Encounter: Payer: Self-pay | Admitting: General Surgery

## 2018-04-23 ENCOUNTER — Telehealth: Payer: Self-pay | Admitting: Family Medicine

## 2018-04-23 NOTE — Telephone Encounter (Signed)
Patient denies any pain, she states that the last two times she used the restroom it was better.

## 2018-04-23 NOTE — Telephone Encounter (Signed)
Sometimes that happens from the pain medicine, anesthesia or the catheter. If she is having any kind of discomfort or is worried about it, have her come on in and we'll see what we can do to help.

## 2018-04-23 NOTE — Telephone Encounter (Signed)
Copied from [place]. Topic: General - Other >> Apr 23, 2018 11:24 AM Carolyn Stare wrote:  Pt said she had surgery on 04/22/18 and now is having a hard time urinating and is asking if this is normal woulsl ike a call back

## 2018-04-25 LAB — SURGICAL PATHOLOGY

## 2018-04-26 ENCOUNTER — Telehealth: Payer: Self-pay | Admitting: *Deleted

## 2018-04-26 NOTE — Telephone Encounter (Signed)
Notified patient as instructed, patient pleased. Discussed follow-up appointments, patient agrees  

## 2018-04-26 NOTE — Telephone Encounter (Signed)
-----   Message from Robert Bellow, MD sent at 04/25/2018  9:46 PM EDT -----  Please notify the patient in the morning that all margins were clear.  Lymph glands were negative.  Thank you ----- Message ----- From: Interface, Lab In Three Zero One Sent: 04/25/2018  10:19 AM EDT To: Robert Bellow, MD

## 2018-04-28 HISTORY — PX: BREAST MAMMOSITE: SHX5264

## 2018-04-30 ENCOUNTER — Encounter: Payer: Self-pay | Admitting: General Surgery

## 2018-04-30 ENCOUNTER — Ambulatory Visit (INDEPENDENT_AMBULATORY_CARE_PROVIDER_SITE_OTHER): Payer: Medicare Other

## 2018-04-30 ENCOUNTER — Telehealth: Payer: Self-pay | Admitting: *Deleted

## 2018-04-30 ENCOUNTER — Ambulatory Visit: Payer: Medicare Other | Admitting: General Surgery

## 2018-04-30 VITALS — BP 126/68 | HR 63 | Resp 16 | Ht <= 58 in | Wt 146.0 lb

## 2018-04-30 DIAGNOSIS — C50812 Malignant neoplasm of overlapping sites of left female breast: Secondary | ICD-10-CM

## 2018-04-30 DIAGNOSIS — Z170 Estrogen receptor positive status [ER+]: Secondary | ICD-10-CM | POA: Diagnosis not present

## 2018-04-30 NOTE — Patient Instructions (Addendum)
The patient is aware to call back for any questions or new concerns.  Appointment with Radiation Oncologist, Dr Baruch Gouty to discuss radiation options.

## 2018-04-30 NOTE — Telephone Encounter (Signed)
-----   Message from Robert Bellow, MD sent at 04/25/2018  9:48 PM EDT ----- Please send for Mammoprint testing if not already completed.  Thank you

## 2018-04-30 NOTE — Telephone Encounter (Signed)
Mammoprint order and pathology faxed as requested.

## 2018-04-30 NOTE — Progress Notes (Signed)
Patient ID: Marie Parker, female   DOB: 1946/03/24, 72 y.o.   MRN: [id number]  Chief Complaint  Patient presents with  . Routine Post Op    HPI Marie Parker is a 72 y.o. female.  Here today for postoperative visit, left partial mastectomy on 04-22-18, she states she is doing well. She is wearing her bra and feels good. She states the axilla area is the worst area. Denies any gastrointestinal issues, bowels are moving regular.  She is here today with her husband, Marie Parker.  HPI  Past Medical History:  Diagnosis Date  . Anxiety   . Arthritis   . CAD (coronary artery disease)   . Cancer (HCC)    squamous cell  . Chronic kidney disease   . Depression   . Family history of adverse reaction to anesthesia    mother had N/V  . Generalized osteoarthritis   . GERD (gastroesophageal reflux disease)   . Gout   . History of endoscopy 11/2012   Gastritis  . Hyperlipidemia   . Hypertension   . IFG (impaired fasting glucose)   . Insomnia   . Menopause   . Osteoporosis   . Parathyroid adenoma   . Personal history of tobacco use, presenting hazards to health 02/02/2016  . PVD (peripheral vascular disease) ([place])   . PVD (peripheral vascular disease) ([place])   . Rotator cuff syndrome of shoulder and allied disorders   . Tobacco abuse     Past Surgical History:  Procedure Laterality Date  . BREAST BIOPSY Left 2014   neg core  . BREAST BIOPSY Left 04/05/2018  . BREAST EXCISIONAL BIOPSY Left 1970's   neg  . BREAST FIBROADENOMA SURGERY    . CARDIAC CATHETERIZATION  09/17/2007   Insignificant CAD with 40-50% stenosis of mid LAD.   [name] ILIAC ARTERY STENT     right iliac artery stent   . MASTECTOMY, PARTIAL Left 04/22/2018   Procedure: MASTECTOMY PARTIAL;  Surgeon: Marie Bellow, MD;  Location: [facility] ORS;  Service: General;  Laterality: Left;  . ORIF PATELLA Left 03/22/2016   Dr. Tristan Schroeder  . PARATHYROIDECTOMY  2012   [facility]  . SENTINEL NODE BIOPSY Left 04/22/2018   Procedure: SENTINEL  NODE BIOPSY;  Surgeon: Marie Bellow, MD;  Location: [facility] ORS;  Service: General;  Laterality: Left;  . SKIN BIOPSY     precancerous    Family History  Problem Relation Age of Onset  . Heart attack Father 52  . Alcohol abuse Father   . Heart disease Father   . Liver disease Father   . Hypertension Father   . Heart disease Sister 67       CAD; s/p stent placement  . Stroke Sister   . AAA (abdominal aortic aneurysm) Sister   . Dementia Sister   . Stroke Mother   . Hypertension Mother   . Diabetes Mother   . Stroke Maternal Grandmother   . Heart attack Maternal Grandfather   . Stroke Paternal Grandmother   . Breast cancer Maternal Aunt 65  . Breast cancer Paternal Aunt   . Bone cancer Paternal Uncle     Social History Social History   Tobacco Use  . Smoking status: Former Smoker    Packs/day: 1.00    Years: 42.00    Pack years: 42.00    Types: Cigarettes    Last attempt to quit: 08/27/2000    Years since quitting: 17.6  . Smokeless tobacco: Never Used  Substance Use Topics  .  Alcohol use: No  . Drug use: No    Allergies  Allergen Reactions  . Crestor [Rosuvastatin] Nausea Only  . Cyclobenzaprine     Vertigo, couldn't focus eyes  . Erythromycin     GI upset   . Fosamax [Alendronate Sodium] Nausea And Vomiting    Current Outpatient Medications  Medication Sig Dispense Refill  . acetaminophen (TYLENOL) 650 MG CR tablet Take 1,950 mg by mouth daily as needed for pain.    [name] amLODipine (NORVASC) 10 MG tablet TAKE 1 TABLET BY MOUTH  DAILY 90 tablet 1  . aspirin 81 MG tablet Take 1 tablet (81 mg total) by mouth daily. (Patient taking differently: Take 81 mg by mouth at bedtime. ) 90 tablet 3  . atenolol (TENORMIN) 50 MG tablet Take 1 tablet (50 mg total) by mouth daily. 90 tablet 1  . atorvastatin (LIPITOR) 40 MG tablet Take 1 tablet (40 mg total) by mouth daily. (Patient taking differently: Take 40 mg by mouth at bedtime. ) 90 tablet 1  . benazepril  (LOTENSIN) 40 MG tablet Take 1 tablet (40 mg total) by mouth daily. 90 tablet 1  . clidinium-chlordiazePOXIDE (LIBRAX) 5-2.5 MG capsule TAKE 1 CAPSULE BY MOUTH TWICE DAILY FOR LUNCH/SUPPER. (Patient taking differently: Take 1 capsule by mouth daily. ) 180 capsule 1  . clobetasol ointment (TEMOVATE) 9.17 % Apply 1 application topically as needed (rash).   1  . clonazePAM (KLONOPIN) 0.5 MG tablet Take 1 tablet (0.5 mg total) by mouth at bedtime as needed. (Patient taking differently: Take 0.5 mg by mouth daily as needed for anxiety. ) 30 tablet 0  . diclofenac sodium (VOLTAREN) 1 % GEL Apply 2 g topically 4 (four) times daily. (Patient taking differently: Apply 1 application topically daily as needed (pain). ) 100 g 2  . diphenhydrAMINE (BENADRYL) 25 mg capsule Take 25 mg by mouth at bedtime.     . fluticasone (FLONASE) 50 MCG/ACT nasal spray USE 2 SPRAYS IN EACH  NOSTRIL DAILY 48 g 12  . hydrALAZINE (APRESOLINE) 25 MG tablet TAKE 1 TABLET BY MOUTH TWO  TIMES DAILY 180 tablet 1  . lidocaine-prilocaine (EMLA) cream Apply 1 application topically as needed. Apply to the areola and cover with plastic wrap one hour prior to leaving for surgery. 5 g 0  . niacin 500 MG tablet Take 1,000 mg by mouth at bedtime.    [name] nystatin ointment (MYCOSTATIN) Apply 1 application topically 2 (two) times daily. 30 g 0  . Olopatadine HCl 0.2 % SOLN Place 1 drop into the left eye daily.    . pantoprazole (PROTONIX) 40 MG tablet Take 1 tablet (40 mg total) by mouth daily. (Patient taking differently: Take 40 mg by mouth at bedtime. ) 90 tablet 1  . ranitidine (ZANTAC) 300 MG tablet Take 1 tablet (300 mg total) by mouth 2 (two) times daily. 180 tablet 1  . sertraline (ZOLOFT) 50 MG tablet TAKE 1 TABLET BY MOUTH  DAILY 90 tablet 1  . tiZANidine (ZANAFLEX) 4 MG tablet TAKE 1 TABLET(4 MG) BY MOUTH EVERY 6 HOURS AS NEEDED FOR MUSCLE SPASMS 30 tablet 3  . Turmeric 500 MG TABS Take 500 mg by mouth daily.     No current  facility-administered medications for this visit.     Review of Systems Review of Systems  Constitutional: Negative.   Respiratory: Negative.   Cardiovascular: Negative.     Blood pressure 126/68, pulse 63, resp. rate 16, height 4\' 10"  (1.473 m), weight 146 lb (  66.2 kg), SpO2 96 %.  Physical Exam Physical Exam  Constitutional: She is oriented to person, place, and time. She appears well-developed and well-nourished.  Pulmonary/Chest:  Left surgical incisions clean    Neurological: She is alert and oriented to person, place, and time.  Skin: Skin is warm and dry.  Psychiatric: Her behavior is normal.    Data Reviewed Results from her April 22, 2018 wide excision were reviewed:  A. BREAST, LEFT, UPPER OUTER QUADRANT; ULTRASOUND-GUIDED WIDE EXCISION:  - INVASIVE MAMMARY CARCINOMA, NO SPECIAL TYPE, SEE COMMENT AND SUMMARY  BELOW.  - BIOPSY SITE CHANGES AND RIBBON-SHAPED MARKER CLIP WITHIN THE  CARCINOMA.  - WING-SHAPED MARKER CLIP PRESENT WITHOUT ASSOCIATED CHANGES.   B. SENTINEL LYMPH NODES #1, #2, AND #3, LEFT AXILLA; EXCISION:  - NEGATIVE FOR MALIGNANCY, 3 LYMPH NODES (0/3).   C. BREAST, LEFT, NEW MEDIAL BORDER; RE-EXCISION:  - FOCAL BIOPSY SITE CHANGES AND ATYPICAL DUCTAL HYPERPLASIA.  - NEGATIVE FOR CARCINOMA.   Carcinoma involves the medial margin in the wide excision but with  re-excision the tumor is at least 10 mm from the final medial margin.  The closest margin is deep, at the fascia.   Ultrasound examination was completed to determine if the patient would be a candidate for accelerated partial breast radiation.  Scanning in the axilla showed no evidence of a seroma.  Under the wide excision site at the 12 o'clock position there is a moderate sized seroma cavity measuring 0.7 x 3.2 x 3.85 cm.  Minimal distance the overlying skin is 1.72 cm.  Adequate for balloon placement.  Assessment    Doing well status post wide excision of her left eft upper outer  quadrant (07-1229 o'clock) breast tumor.    Plan    Mammoprint order sent this morning based on tumor size.  Possibility of MammoSite placement reviewed.  Patient will be evaluated by radiation oncology in this regard.  I will hold formal medical oncology assessment until the Mammoprint results have returned.    Appointment with Radiation Oncologist, Dr Baruch Gouty to discuss radiation options. The patient is aware to call back for any questions or new concerns.      HPI, Physical Exam, Assessment and Plan have been scribed under the direction and in the presence of Marie Bellow, MD. Marie Fetch, RN  I have completed the exam and reviewed the above documentation for accuracy and completeness.  I agree with the above.  Haematologist has been used and any errors in dictation or transcription are unintentional.  Marie Parker, M.D., F.A.C.S.  Marie Parker 04/30/2018, 9:22 PM

## 2018-05-01 DIAGNOSIS — C50412 Malignant neoplasm of upper-outer quadrant of left female breast: Secondary | ICD-10-CM | POA: Diagnosis not present

## 2018-05-02 ENCOUNTER — Other Ambulatory Visit: Payer: Self-pay

## 2018-05-03 ENCOUNTER — Encounter: Payer: Self-pay | Admitting: General Surgery

## 2018-05-07 ENCOUNTER — Encounter: Payer: Self-pay | Admitting: General Surgery

## 2018-05-07 ENCOUNTER — Telehealth: Payer: Self-pay | Admitting: General Surgery

## 2018-05-07 NOTE — Telephone Encounter (Signed)
She was notified that her Mammoprint testing showed her to be low risk for recurrent disease.  At present, little indication for adjuvant chemotherapy.  She is scheduled to meet with radiation oncology tomorrow.  Likely candidate for MammoSite placement.  We will review Mammoprint testing in detail at follow-up.

## 2018-05-08 ENCOUNTER — Other Ambulatory Visit: Payer: Self-pay

## 2018-05-08 ENCOUNTER — Encounter: Payer: Self-pay | Admitting: Radiation Oncology

## 2018-05-08 ENCOUNTER — Ambulatory Visit
Admission: RE | Admit: 2018-05-08 | Discharge: 2018-05-08 | Disposition: A | Discharge status: Home / Self Care | Payer: Medicare Other | Source: Ambulatory Visit | Attending: Radiation Oncology | Admitting: Radiation Oncology

## 2018-05-08 DIAGNOSIS — Z808 Family history of malignant neoplasm of other organs or systems: Secondary | ICD-10-CM | POA: Insufficient documentation

## 2018-05-08 DIAGNOSIS — M129 Arthropathy, unspecified: Secondary | ICD-10-CM | POA: Insufficient documentation

## 2018-05-08 DIAGNOSIS — I129 Hypertensive chronic kidney disease with stage 1 through stage 4 chronic kidney disease, or unspecified chronic kidney disease: Secondary | ICD-10-CM | POA: Insufficient documentation

## 2018-05-08 DIAGNOSIS — Z79899 Other long term (current) drug therapy: Secondary | ICD-10-CM | POA: Insufficient documentation

## 2018-05-08 DIAGNOSIS — N183 Chronic kidney disease, stage 3 (moderate): Secondary | ICD-10-CM | POA: Diagnosis not present

## 2018-05-08 DIAGNOSIS — E785 Hyperlipidemia, unspecified: Secondary | ICD-10-CM | POA: Insufficient documentation

## 2018-05-08 DIAGNOSIS — D367 Benign neoplasm of other specified sites: Secondary | ICD-10-CM | POA: Insufficient documentation

## 2018-05-08 DIAGNOSIS — M1990 Unspecified osteoarthritis, unspecified site: Secondary | ICD-10-CM | POA: Insufficient documentation

## 2018-05-08 DIAGNOSIS — F418 Other specified anxiety disorders: Secondary | ICD-10-CM | POA: Insufficient documentation

## 2018-05-08 DIAGNOSIS — K219 Gastro-esophageal reflux disease without esophagitis: Secondary | ICD-10-CM | POA: Diagnosis not present

## 2018-05-08 DIAGNOSIS — Z87891 Personal history of nicotine dependence: Secondary | ICD-10-CM | POA: Diagnosis not present

## 2018-05-08 DIAGNOSIS — Z803 Family history of malignant neoplasm of breast: Secondary | ICD-10-CM | POA: Insufficient documentation

## 2018-05-08 DIAGNOSIS — I739 Peripheral vascular disease, unspecified: Secondary | ICD-10-CM | POA: Diagnosis not present

## 2018-05-08 DIAGNOSIS — C50812 Malignant neoplasm of overlapping sites of left female breast: Secondary | ICD-10-CM | POA: Insufficient documentation

## 2018-05-08 DIAGNOSIS — I2510 Atherosclerotic heart disease of native coronary artery without angina pectoris: Secondary | ICD-10-CM | POA: Diagnosis not present

## 2018-05-08 DIAGNOSIS — Z170 Estrogen receptor positive status [ER+]: Secondary | ICD-10-CM | POA: Diagnosis not present

## 2018-05-08 DIAGNOSIS — M810 Age-related osteoporosis without current pathological fracture: Secondary | ICD-10-CM | POA: Insufficient documentation

## 2018-05-08 NOTE — Consult Note (Signed)
NEW PATIENT EVALUATION  Name: Marie Parker  MRN: [id number]  Date:   05/08/2018     DOB: 09-Jun-1946   This 72 y.o. female patient presents to the clinic for initial evaluation of stage I (T1 CN 0 M0).ER/PR positive HER-2/neu negative invasive mammary carcinoma status post wide local excision and sentinel node biopsy  REFERRING PHYSICIAN: Valerie Roys, DO  CHIEF COMPLAINT:  Chief Complaint  Patient presents with  . Breast Cancer    Initial Eval    DIAGNOSIS: The encounter diagnosis was Malignant neoplasm of overlapping sites of left female breast, unspecified estrogen receptor status ([place]).   PREVIOUS INVESTIGATIONS:  Pathology reports reviewed Mammogram and ultrasound reviewed Clinical notes reviewed  HPI: patient is a 72 year old female who presented with an abnormal mammogram of her left breast showing a 1.6 cm mass in the 12:00 position highly suspicious for malignancy. She underwent ultrasound-guided biopsy which was positive for invasive mammary carcinoma. She then underwent a wide local excision and sentinel node biopsy.tumor was 1.6 cm overall grade 2. Margins were clear but close at 1 mm. 3 sentinel lymph nodes were examined and were negative for malignancy. No lymphovascular invasion was identified. Tumor was strongly ER/PR positive HER-2/neu not overexpressed. Postoperatively ultrasound of her seroma cavity was performed showing suitable for MammoSite balloon placement. She is seen today for opinion with radiation oncology and is doing well. She specifically denies breast tenderness cough or bone pain. She is still having some discomfort in her axilla.  PLANNED TREATMENT REGIMEN: accelerated partial breast radiation  PAST MEDICAL HISTORY:  has a past medical history of Anxiety, Arthritis, CAD (coronary artery disease), Cancer ([place]), Chronic kidney disease, Depression, Family history of adverse reaction to anesthesia, Generalized osteoarthritis, GERD (gastroesophageal  reflux disease), Gout, History of endoscopy (11/2012), Hyperlipidemia, Hypertension, IFG (impaired fasting glucose), Insomnia, Menopause, Osteoporosis, Parathyroid adenoma, Personal history of tobacco use, presenting hazards to health (02/02/2016), PVD (peripheral vascular disease) ([place]), PVD (peripheral vascular disease) ([place]), Rotator cuff syndrome of shoulder and allied disorders, and Tobacco abuse.    PAST SURGICAL HISTORY:  Past Surgical History:  Procedure Laterality Date  . BREAST BIOPSY Left 2014   neg core  . BREAST BIOPSY Left 04/05/2018  . BREAST EXCISIONAL BIOPSY Left 1970's   neg  . BREAST FIBROADENOMA SURGERY    . CARDIAC CATHETERIZATION  09/17/2007   Insignificant CAD with 40-50% stenosis of mid LAD.   [name] ILIAC ARTERY STENT     right iliac artery stent   . MASTECTOMY, PARTIAL Left 04/22/2018   Procedure: MASTECTOMY PARTIAL;  Surgeon: Robert Bellow, MD;  Location: [facility] ORS;  Service: General;  Laterality: Left;  . ORIF PATELLA Left 03/22/2016   Dr. Tristan Schroeder  . PARATHYROIDECTOMY  2012   [facility]  . SENTINEL NODE BIOPSY Left 04/22/2018   Procedure: SENTINEL NODE BIOPSY;  Surgeon: Robert Bellow, MD;  Location: [facility] ORS;  Service: General;  Laterality: Left;  . SKIN BIOPSY     precancerous    FAMILY HISTORY: family history includes AAA (abdominal aortic aneurysm) in her sister; Alcohol abuse in her father; Bone cancer in her paternal uncle; Breast cancer in her paternal aunt; Breast cancer (age of onset: 24) in her maternal aunt; Dementia in her sister; Diabetes in her mother; Heart attack in her maternal grandfather; Heart attack (age of onset: 37) in her father; Heart disease in her father; Heart disease (age of onset: 4) in her sister; Hypertension in her father and mother; Liver disease in her father; Stroke in  her maternal grandmother, mother, paternal grandmother, and sister.  SOCIAL HISTORY:  reports that she quit smoking about 17 years ago. Her smoking use included  cigarettes. She has a 42.00 pack-year smoking history. She has never used smokeless tobacco. She reports that she does not drink alcohol or use drugs.  ALLERGIES: Crestor [rosuvastatin]; Cyclobenzaprine; Erythromycin; and Fosamax [alendronate sodium]  MEDICATIONS:  Current Outpatient Medications  Medication Sig Dispense Refill  . acetaminophen (TYLENOL) 650 MG CR tablet Take 1,950 mg by mouth daily as needed for pain.    [name] amLODipine (NORVASC) 10 MG tablet TAKE 1 TABLET BY MOUTH  DAILY 90 tablet 1  . aspirin 81 MG tablet Take 1 tablet (81 mg total) by mouth daily. (Patient taking differently: Take 81 mg by mouth at bedtime. ) 90 tablet 3  . atenolol (TENORMIN) 50 MG tablet Take 1 tablet (50 mg total) by mouth daily. 90 tablet 1  . atorvastatin (LIPITOR) 40 MG tablet Take 1 tablet (40 mg total) by mouth daily. (Patient taking differently: Take 40 mg by mouth at bedtime. ) 90 tablet 1  . benazepril (LOTENSIN) 40 MG tablet Take 1 tablet (40 mg total) by mouth daily. 90 tablet 1  . clidinium-chlordiazePOXIDE (LIBRAX) 5-2.5 MG capsule TAKE 1 CAPSULE BY MOUTH TWICE DAILY FOR LUNCH/SUPPER. (Patient taking differently: Take 1 capsule by mouth daily. ) 180 capsule 1  . clobetasol ointment (TEMOVATE) 2.72 % Apply 1 application topically as needed (rash).   1  . clonazePAM (KLONOPIN) 0.5 MG tablet Take 1 tablet (0.5 mg total) by mouth at bedtime as needed. (Patient taking differently: Take 0.5 mg by mouth daily as needed for anxiety. ) 30 tablet 0  . diclofenac sodium (VOLTAREN) 1 % GEL Apply 2 g topically 4 (four) times daily. (Patient taking differently: Apply 1 application topically daily as needed (pain). ) 100 g 2  . diphenhydrAMINE (BENADRYL) 25 mg capsule Take 25 mg by mouth at bedtime.     . fluticasone (FLONASE) 50 MCG/ACT nasal spray USE 2 SPRAYS IN EACH  NOSTRIL DAILY 48 g 12  . hydrALAZINE (APRESOLINE) 25 MG tablet TAKE 1 TABLET BY MOUTH TWO  TIMES DAILY 180 tablet 1  . lidocaine-prilocaine  (EMLA) cream Apply 1 application topically as needed. Apply to the areola and cover with plastic wrap one hour prior to leaving for surgery. 5 g 0  . niacin 500 MG tablet Take 1,000 mg by mouth at bedtime.    [name] nystatin ointment (MYCOSTATIN) Apply 1 application topically 2 (two) times daily. 30 g 0  . Olopatadine HCl 0.2 % SOLN Place 1 drop into the left eye daily.    . pantoprazole (PROTONIX) 40 MG tablet Take 1 tablet (40 mg total) by mouth daily. (Patient taking differently: Take 40 mg by mouth at bedtime. ) 90 tablet 1  . ranitidine (ZANTAC) 300 MG tablet Take 1 tablet (300 mg total) by mouth 2 (two) times daily. 180 tablet 1  . sertraline (ZOLOFT) 50 MG tablet TAKE 1 TABLET BY MOUTH  DAILY 90 tablet 1  . tiZANidine (ZANAFLEX) 4 MG tablet TAKE 1 TABLET(4 MG) BY MOUTH EVERY 6 HOURS AS NEEDED FOR MUSCLE SPASMS 30 tablet 3  . Turmeric 500 MG TABS Take 500 mg by mouth daily.     No current facility-administered medications for this encounter.     ECOG PERFORMANCE STATUS:  0 - Asymptomatic  REVIEW OF SYSTEMS:  Patient denies any weight loss, fatigue, weakness, fever, chills or night sweats. Patient denies any  loss of vision, blurred vision. Patient denies any ringing  of the ears or hearing loss. No irregular heartbeat. Patient denies heart murmur or history of fainting. Patient denies any chest pain or pain radiating to her upper extremities. Patient denies any shortness of breath, difficulty breathing at night, cough or hemoptysis. Patient denies any swelling in the lower legs. Patient denies any nausea vomiting, vomiting of blood, or coffee ground material in the vomitus. Patient denies any stomach pain. Patient states has had normal bowel movements no significant constipation or diarrhea. Patient denies any dysuria, hematuria or significant nocturia. Patient denies any problems walking, swelling in the joints or loss of balance. Patient denies any skin changes, loss of hair or loss of weight.  Patient denies any excessive worrying or anxiety or significant depression. Patient denies any problems with insomnia. Patient denies excessive thirst, polyuria, polydipsia. Patient denies any swollen glands, patient denies easy bruising or easy bleeding. Patient denies any recent infections, allergies or URI. Patient "s visual fields have not changed significantly in recent time.    PHYSICAL EXAM: BP (!) (P) 144/80 (BP Location: Left Arm, Patient Position: Sitting)   Pulse (P) 68   Temp (P) 98 F (36.7 C) (Tympanic)   Wt (P) 146 lb 13.2 oz (66.6 kg)   BMI (P) 30.69 kg/m  Patient is a wide local excision and sentinel node biopsy scar well healing well in her left breast. No dominant mass or nodularity is noted in either breast in 2 positions examined. No axillary or supraclavicular adenopathy is identified.Well-developed well-nourished patient in NAD. HEENT reveals PERLA, EOMI, discs not visualized.  Oral cavity is clear. No oral mucosal lesions are identified. Neck is clear without evidence of cervical or supraclavicular adenopathy. Lungs are clear to A&P. Cardiac examination is essentially unremarkable with regular rate and rhythm without murmur rub or thrill. Abdomen is benign with no organomegaly or masses noted. Motor sensory and DTR levels are equal and symmetric in the upper and lower extremities. Cranial nerves II through XII are grossly intact. Proprioception is intact. No peripheral adenopathy or edema is identified. No motor or sensory levels are noted. Crude visual fields are within normal range.  LABORATORY DATA: pathology reports reviewed    RADIOLOGY RESULTS:mammogram and ultrasound reviewed   IMPRESSION: stage I ER/PR positive invasive mammary carcinoma of the left breast status post wide local excision and sentinel node biopsy in 72 year old female with seroma cavity suitable for MammoSite balloon placement.  PLAN: at this time we have discussed the risks and benefits of whole  breast radiation as well as partial breast irradiation. Patient is interested in the 5 day course of accelerated partial breast radiation. Risks and benefits of that treatment including small area of redness and itching the skin over the balloon site permanent thickening of her lumpectomy cavity fatigue all were discussed in detail with the patient. She seems to comprehend my treatment plan well. We are coordinating with Dr. Barbette Hair office placement of the balloon catheter and then follow-up simulation and treatment planning. Patient also will be candidate for antiestrogen therapy after completion of radiation. She is alert had a MammaPrint performed and according to the patient shows low risk of recurrence so she will not be a candidate for systemic therapy.  I would like to take this opportunity to thank you for allowing me to participate in the care of your patient.Noreene Filbert, MD

## 2018-05-09 ENCOUNTER — Telehealth: Payer: Self-pay | Admitting: General Surgery

## 2018-05-09 MED ORDER — CEFADROXIL 500 MG PO CAPS: 500.0000 mg | ORAL_CAPSULE | Freq: Two times a day (BID) | ORAL | 0 refills | Status: DC

## 2018-05-09 NOTE — Addendum Note (Signed)
Addended by: Carson Myrtle on: 05/09/2018 11:13 AM   Modules accepted: Orders

## 2018-05-09 NOTE — Telephone Encounter (Signed)
Mammosite schedule  Placement 05-14-18   at [facility] at 8:00 Scan 05-16-18 Treat 23-27 Aware the [facility] will be calling her for more details Aware of ATB and directions reviewed. Aware no showers and to wear her bra while mammosite in place.

## 2018-05-09 NOTE — Telephone Encounter (Signed)
Notified patient as instructed, patient pleased. Discussed follow-up appointments, patient agrees  

## 2018-05-09 NOTE — Telephone Encounter (Signed)
Larene Beach at Dr Donette Larry office called and would like you to return her call regarding patient for a mammosite lt br. Please call her at her new # [phone].

## 2018-05-14 ENCOUNTER — Ambulatory Visit: Payer: Medicare Other | Admitting: General Surgery

## 2018-05-14 ENCOUNTER — Ambulatory Visit: Payer: Self-pay

## 2018-05-14 ENCOUNTER — Encounter: Payer: Self-pay | Admitting: General Surgery

## 2018-05-14 VITALS — BP 126/76 | HR 64 | Resp 12 | Ht <= 58 in | Wt 146.0 lb

## 2018-05-14 DIAGNOSIS — Z170 Estrogen receptor positive status [ER+]: Principal | ICD-10-CM

## 2018-05-14 DIAGNOSIS — C50812 Malignant neoplasm of overlapping sites of left female breast: Secondary | ICD-10-CM

## 2018-05-14 NOTE — Progress Notes (Signed)
Patient ID: Marie Parker, female   DOB: 03/25/1946, 72 y.o.   MRN: [id number]  Chief Complaint  Patient presents with  . Procedure    HPI Marie Parker is a 72 y.o. female.  The patient has met with radiation oncology and is felt to be a suitable candidate for accelerated partial breast radiation.  The procedure was reviewed with the patient and she was amenable to proceed.    HPI  Past Medical History:  Diagnosis Date  . Anxiety   . Arthritis   . CAD (coronary artery disease)   . Cancer ([facility])    squamous cell  . Chronic kidney disease   . Depression   . Family history of adverse reaction to anesthesia    mother had N/V  . Generalized osteoarthritis   . GERD (gastroesophageal reflux disease)   . Gout   . History of endoscopy 11/2012   Gastritis  . Hyperlipidemia   . Hypertension   . IFG (impaired fasting glucose)   . Insomnia   . Menopause   . Osteoporosis   . Parathyroid adenoma   . Personal history of tobacco use, presenting hazards to health 02/02/2016  . PVD (peripheral vascular disease) ([place])   . PVD (peripheral vascular disease) ([place])   . Rotator cuff syndrome of shoulder and allied disorders   . Tobacco abuse     Past Surgical History:  Procedure Laterality Date  . BREAST BIOPSY Left 2014   neg core  . BREAST BIOPSY Left 04/05/2018  . BREAST EXCISIONAL BIOPSY Left 1970's   neg  . BREAST FIBROADENOMA SURGERY    . CARDIAC CATHETERIZATION  09/17/2007   Insignificant CAD with 40-50% stenosis of mid LAD.   [name] ILIAC ARTERY STENT     right iliac artery stent   . MASTECTOMY, PARTIAL Left 04/22/2018   Procedure: MASTECTOMY PARTIAL;  Surgeon: Robert Bellow, MD;  Location: [facility] ORS;  Service: General;  Laterality: Left;  . ORIF PATELLA Left 03/22/2016   Dr. Tristan Schroeder  . PARATHYROIDECTOMY  2012   [facility]  . SENTINEL NODE BIOPSY Left 04/22/2018   Procedure: SENTINEL NODE BIOPSY;  Surgeon: Robert Bellow, MD;  Location: [facility] ORS;  Service: General;  Laterality:  Left;  . SKIN BIOPSY     precancerous    Family History  Problem Relation Age of Onset  . Heart attack Father 54  . Alcohol abuse Father   . Heart disease Father   . Liver disease Father   . Hypertension Father   . Heart disease Sister 36       CAD; s/p stent placement  . Stroke Sister   . AAA (abdominal aortic aneurysm) Sister   . Dementia Sister   . Stroke Mother   . Hypertension Mother   . Diabetes Mother   . Stroke Maternal Grandmother   . Heart attack Maternal Grandfather   . Stroke Paternal Grandmother   . Breast cancer Maternal Aunt 65  . Breast cancer Paternal Aunt   . Bone cancer Paternal Uncle     Social History Social History   Tobacco Use  . Smoking status: Former Smoker    Packs/day: 1.00    Years: 42.00    Pack years: 42.00    Types: Cigarettes    Last attempt to quit: 08/27/2000    Years since quitting: 17.7  . Smokeless tobacco: Never Used  Substance Use Topics  . Alcohol use: No  . Drug use: No    Allergies  Allergen Reactions  .  Crestor [Rosuvastatin] Nausea Only  . Cyclobenzaprine     Vertigo, couldn't focus eyes  . Erythromycin     GI upset   . Fosamax [Alendronate Sodium] Nausea And Vomiting    Current Outpatient Medications  Medication Sig Dispense Refill  . acetaminophen (TYLENOL) 650 MG CR tablet Take 1,950 mg by mouth daily as needed for pain.    [name] amLODipine (NORVASC) 10 MG tablet TAKE 1 TABLET BY MOUTH  DAILY 90 tablet 1  . aspirin 81 MG tablet Take 1 tablet (81 mg total) by mouth daily. (Patient taking differently: Take 81 mg by mouth at bedtime. ) 90 tablet 3  . atenolol (TENORMIN) 50 MG tablet Take 1 tablet (50 mg total) by mouth daily. 90 tablet 1  . atorvastatin (LIPITOR) 40 MG tablet Take 1 tablet (40 mg total) by mouth daily. (Patient taking differently: Take 40 mg by mouth at bedtime. ) 90 tablet 1  . benazepril (LOTENSIN) 40 MG tablet Take 1 tablet (40 mg total) by mouth daily. 90 tablet 1  . cefadroxil (DURICEF) 500  MG capsule Take 1 capsule (500 mg total) by mouth 2 (two) times daily. Please Start the medication one hour prior to office procedure on 05-14-18 22 capsule 0  . clidinium-chlordiazePOXIDE (LIBRAX) 5-2.5 MG capsule TAKE 1 CAPSULE BY MOUTH TWICE DAILY FOR LUNCH/SUPPER. (Patient taking differently: Take 1 capsule by mouth daily. ) 180 capsule 1  . clobetasol ointment (TEMOVATE) 2.87 % Apply 1 application topically as needed (rash).   1  . clonazePAM (KLONOPIN) 0.5 MG tablet Take 1 tablet (0.5 mg total) by mouth at bedtime as needed. (Patient taking differently: Take 0.5 mg by mouth daily as needed for anxiety. ) 30 tablet 0  . diclofenac sodium (VOLTAREN) 1 % GEL Apply 2 g topically 4 (four) times daily. (Patient taking differently: Apply 1 application topically daily as needed (pain). ) 100 g 2  . diphenhydrAMINE (BENADRYL) 25 mg capsule Take 25 mg by mouth at bedtime.     . fluticasone (FLONASE) 50 MCG/ACT nasal spray USE 2 SPRAYS IN EACH  NOSTRIL DAILY 48 g 12  . hydrALAZINE (APRESOLINE) 25 MG tablet TAKE 1 TABLET BY MOUTH TWO  TIMES DAILY 180 tablet 1  . niacin 500 MG tablet Take 1,000 mg by mouth at bedtime.    [name] nystatin ointment (MYCOSTATIN) Apply 1 application topically 2 (two) times daily. 30 g 0  . Olopatadine HCl 0.2 % SOLN Place 1 drop into the left eye daily.    . pantoprazole (PROTONIX) 40 MG tablet Take 1 tablet (40 mg total) by mouth daily. (Patient taking differently: Take 40 mg by mouth at bedtime. ) 90 tablet 1  . Probiotic Product (PROBIOTIC-10 PO) Take by mouth daily.    . ranitidine (ZANTAC) 300 MG tablet Take 1 tablet (300 mg total) by mouth 2 (two) times daily. 180 tablet 1  . sertraline (ZOLOFT) 50 MG tablet TAKE 1 TABLET BY MOUTH  DAILY 90 tablet 1  . tiZANidine (ZANAFLEX) 4 MG tablet TAKE 1 TABLET(4 MG) BY MOUTH EVERY 6 HOURS AS NEEDED FOR MUSCLE SPASMS 30 tablet 3  . Turmeric 500 MG TABS Take 500 mg by mouth daily.    [name] lidocaine-prilocaine (EMLA) cream Apply 1 application  topically as needed. Apply to the areola and cover with plastic wrap one hour prior to leaving for surgery. (Patient not taking: Reported on 05/14/2018) 5 g 0   No current facility-administered medications for this visit.     Review of  Systems Review of Systems  Constitutional: Negative.   Respiratory: Negative.   Cardiovascular: Negative.     Blood pressure 126/76, pulse 64, resp. rate 12, height 4' 10"  (1.473 m), weight 146 lb (66.2 kg), SpO2 98 %.  Physical Exam Physical Exam  Constitutional: She is oriented to person, place, and time. She appears well-developed and well-nourished.  Pulmonary/Chest:    Neurological: She is alert and oriented to person, place, and time.  Skin: Skin is warm and dry.  Psychiatric: Her behavior is normal.    Data Reviewed Ultrasound examination showed a 1.2 x 3.16 x 3.52 cm seroma cavity at a minimal distance of 1.6 cm from the skin.  Adequate for balloon placement.  The skin was cleansed with ChloraPrep and 10 cc of 0.5% Xylocaine with 0.25% Marcaine with 1 to 200,000 units of epinephrine was utilized and well-tolerated.  The area was recleansed with ChloraPrep and draped.  A knife was used to incise the skin lateral to the wide excision site and an 8 mm cutting trocar advanced into the seroma cavity and drained.  Thin, yellow seroma fluid was noted.  No odor.  The cavity evaluation device had previously been inflated to 70 cc and was spherical.  This was inserted without incident and inflated to 40 cc with spherical insufflation and a minimal distance of the skin of 1.16 cm.  The treatment balloon was prepared and inflated to 70 cc with a mixture of saline and Omnipaque.  Spherical insufflation was noted.  The cavity evaluation device was removed and the treatment balloon placed without incident.  This was inflated to 40 cc and spherical insufflation noted with a minimal distance of the skin of 1.16.  The entrance site was treated with bacitracin  ointment followed by dry dressing.   Assessment    Successful placement of MammoSite balloon for accelerated partial breast radiation.    Plan    The patient tolerated the procedure well and was provided with written instructions regarding dressing care.  She is scheduled for simulation in 2 days.  She will continue her Duricef 500 mg p.o. twice daily.    Follow-up here will be in 2 weeks.     HPI, Physical Exam, Assessment and Plan have been scribed under the direction and in the presence of Robert Bellow, MD. Karie Fetch, RN  I have completed the exam and reviewed the above documentation for accuracy and completeness.  I agree with the above.  Haematologist has been used and any errors in dictation or transcription are unintentional.  Hervey Ard, M.D., F.A.C.S.   Marie Parker Marie Parker 05/14/2018, 8:42 AM

## 2018-05-14 NOTE — Patient Instructions (Addendum)
The patient is aware to call back for any questions or concerns. Patient care kit given to patient.  Instructed no showers, sponge bath while mammosite in place, take antibiotic. Follow up with [facility] as arranged. Discussed wearing your bra for support at all times.

## 2018-05-16 ENCOUNTER — Ambulatory Visit
Admission: RE | Admit: 2018-05-16 | Discharge: 2018-05-16 | Disposition: A | Discharge status: Home / Self Care | Payer: Medicare Other | Source: Ambulatory Visit | Attending: Radiation Oncology | Admitting: Radiation Oncology

## 2018-05-16 DIAGNOSIS — Z510 Encounter for antineoplastic radiation therapy: Secondary | ICD-10-CM | POA: Diagnosis not present

## 2018-05-16 DIAGNOSIS — C50812 Malignant neoplasm of overlapping sites of left female breast: Secondary | ICD-10-CM | POA: Diagnosis not present

## 2018-05-16 DIAGNOSIS — Z170 Estrogen receptor positive status [ER+]: Secondary | ICD-10-CM | POA: Diagnosis not present

## 2018-05-20 ENCOUNTER — Ambulatory Visit
Admission: RE | Admit: 2018-05-20 | Discharge: 2018-05-20 | Disposition: A | Discharge status: Home / Self Care | Payer: Medicare Other | Source: Ambulatory Visit | Attending: Radiation Oncology | Admitting: Radiation Oncology

## 2018-05-20 DIAGNOSIS — Z170 Estrogen receptor positive status [ER+]: Secondary | ICD-10-CM | POA: Diagnosis not present

## 2018-05-20 DIAGNOSIS — C50812 Malignant neoplasm of overlapping sites of left female breast: Secondary | ICD-10-CM | POA: Diagnosis not present

## 2018-05-20 DIAGNOSIS — Z510 Encounter for antineoplastic radiation therapy: Secondary | ICD-10-CM | POA: Diagnosis not present

## 2018-05-21 ENCOUNTER — Ambulatory Visit
Admission: RE | Admit: 2018-05-21 | Discharge: 2018-05-21 | Disposition: A | Discharge status: Home / Self Care | Payer: Medicare Other | Source: Ambulatory Visit | Attending: Radiation Oncology | Admitting: Radiation Oncology

## 2018-05-21 DIAGNOSIS — C50812 Malignant neoplasm of overlapping sites of left female breast: Secondary | ICD-10-CM | POA: Diagnosis not present

## 2018-05-21 DIAGNOSIS — Z170 Estrogen receptor positive status [ER+]: Secondary | ICD-10-CM | POA: Diagnosis not present

## 2018-05-21 DIAGNOSIS — Z510 Encounter for antineoplastic radiation therapy: Secondary | ICD-10-CM | POA: Diagnosis not present

## 2018-05-22 ENCOUNTER — Ambulatory Visit
Admission: RE | Admit: 2018-05-22 | Discharge: 2018-05-22 | Disposition: A | Discharge status: Home / Self Care | Payer: Medicare Other | Source: Ambulatory Visit | Attending: Radiation Oncology | Admitting: Radiation Oncology

## 2018-05-22 DIAGNOSIS — Z510 Encounter for antineoplastic radiation therapy: Secondary | ICD-10-CM | POA: Diagnosis not present

## 2018-05-22 DIAGNOSIS — Z170 Estrogen receptor positive status [ER+]: Secondary | ICD-10-CM | POA: Diagnosis not present

## 2018-05-22 DIAGNOSIS — C50812 Malignant neoplasm of overlapping sites of left female breast: Secondary | ICD-10-CM | POA: Diagnosis not present

## 2018-05-23 ENCOUNTER — Ambulatory Visit
Admission: RE | Admit: 2018-05-23 | Discharge: 2018-05-23 | Disposition: A | Discharge status: Home / Self Care | Payer: Medicare Other | Source: Ambulatory Visit | Attending: Radiation Oncology | Admitting: Radiation Oncology

## 2018-05-23 DIAGNOSIS — Z510 Encounter for antineoplastic radiation therapy: Secondary | ICD-10-CM | POA: Diagnosis not present

## 2018-05-23 DIAGNOSIS — C50812 Malignant neoplasm of overlapping sites of left female breast: Secondary | ICD-10-CM | POA: Diagnosis not present

## 2018-05-23 DIAGNOSIS — Z170 Estrogen receptor positive status [ER+]: Secondary | ICD-10-CM | POA: Diagnosis not present

## 2018-05-24 ENCOUNTER — Ambulatory Visit
Admission: RE | Admit: 2018-05-24 | Discharge: 2018-05-24 | Disposition: A | Discharge status: Home / Self Care | Payer: Medicare Other | Source: Ambulatory Visit | Attending: Radiation Oncology | Admitting: Radiation Oncology

## 2018-05-24 ENCOUNTER — Encounter: Payer: Self-pay | Admitting: Family Medicine

## 2018-05-24 ENCOUNTER — Ambulatory Visit (INDEPENDENT_AMBULATORY_CARE_PROVIDER_SITE_OTHER): Payer: Medicare Other | Admitting: Family Medicine

## 2018-05-24 ENCOUNTER — Other Ambulatory Visit: Payer: Self-pay

## 2018-05-24 VITALS — BP 128/76 | HR 71 | Temp 98.2°F | Ht <= 58 in | Wt 147.0 lb

## 2018-05-24 DIAGNOSIS — C50812 Malignant neoplasm of overlapping sites of left female breast: Secondary | ICD-10-CM | POA: Diagnosis not present

## 2018-05-24 DIAGNOSIS — Z510 Encounter for antineoplastic radiation therapy: Secondary | ICD-10-CM | POA: Diagnosis not present

## 2018-05-24 DIAGNOSIS — Z170 Estrogen receptor positive status [ER+]: Secondary | ICD-10-CM | POA: Diagnosis not present

## 2018-05-24 DIAGNOSIS — N939 Abnormal uterine and vaginal bleeding, unspecified: Secondary | ICD-10-CM | POA: Diagnosis not present

## 2018-05-24 DIAGNOSIS — Z23 Encounter for immunization: Secondary | ICD-10-CM

## 2018-05-24 LAB — UA/M W/RFLX CULTURE, ROUTINE
Bilirubin, UA: NEGATIVE
Glucose, UA: NEGATIVE
Ketones, UA: NEGATIVE
Leukocytes, UA: NEGATIVE
Nitrite, UA: NEGATIVE
PH UA: 5 (ref 5.0–7.5)
PROTEIN UA: NEGATIVE
Specific Gravity, UA: <1.005 — ABNORMAL LOW (ref 1.005–1.030)
Urobilinogen, Ur: 0.2 mg/dL (ref 0.2–1.0)

## 2018-05-24 NOTE — Progress Notes (Signed)
   BP 128/76   Pulse 71   Temp 98.2 F (36.8 C) (Oral)   Ht 4\' 10"  (1.473 m)   Wt 147 lb (66.7 kg)   SpO2 98%   BMI 30.72 kg/m    Subjective:    Patient ID: Marie Parker, female    DOB: May 02, 1946, 72 y.o.   MRN: [id number]  HPI: Marie Parker is a 72 y.o. female  Chief Complaint  Patient presents with  . Vaginal Bleeding    since this morning light bleeding   Patient here today very concerned about light vaginal bleeding this morning when she wiped after urinating. Light pink at first, then bright red and larger quantity when she applied pressure. Denies cramping, abdominal pain, dysuria, or any sort of sexual contact. Went through menopause about 20 years ago and has never had a spotting incident since. Currently completing treatments for left breast cancer.    Relevant past medical, surgical, family and social history reviewed and updated as indicated. Interim medical history since our last visit reviewed. Allergies and medications reviewed and updated.  Review of Systems  Per HPI unless specifically indicated above     Objective:    BP 128/76   Pulse 71   Temp 98.2 F (36.8 C) (Oral)   Ht 4\' 10"  (1.473 m)   Wt 147 lb (66.7 kg)   SpO2 98%   BMI 30.72 kg/m   Wt Readings from Last 3 Encounters:  05/24/18 147 lb (66.7 kg)  05/14/18 146 lb (66.2 kg)  05/08/18 (P) 146 lb 13.2 oz (66.6 kg)    Physical Exam  Constitutional: She is oriented to person, place, and time. She appears well-developed and well-nourished. No distress.  HENT:  Head: Atraumatic.  Eyes: Conjunctivae and EOM are normal.  Neck: Normal range of motion. Neck supple.  Cardiovascular: Normal rate and regular rhythm.  Pulmonary/Chest: Effort normal and breath sounds normal.  Abdominal: Soft. Bowel sounds are normal. She exhibits no distension. There is no tenderness.  Musculoskeletal: Normal range of motion.  Neurological: She is alert and oriented to person, place, and time.  Skin: Skin is  warm and dry.  Psychiatric: She has a normal mood and affect. Her behavior is normal.  Nursing note and vitals reviewed.   Results for orders placed or performed in visit on 05/24/18  UA/M w/rflx Culture, Routine  Result Value Ref Range   Specific Gravity, UA <1.005 (L) 1.005 - 1.030   pH, UA 5.0 5.0 - 7.5   Color, UA Yellow Yellow   Appearance Ur Clear Clear   Leukocytes, UA Negative Negative   Protein, UA Negative Negative/Trace   Glucose, UA Negative Negative   Ketones, UA Negative Negative   RBC, UA Trace (A) Negative   Bilirubin, UA Negative Negative   Urobilinogen, Ur 0.2 0.2 - 1.0 mg/dL   Nitrite, UA Negative Negative      Assessment & Plan:   Problem List Items Addressed This Visit    None    Visit Diagnoses    Vaginal bleeding    -  Primary   Urgent referral placed to GYN for eval, obtain U/A today.    Relevant Orders   UA/M w/rflx Culture, Routine (Completed)   Ambulatory referral to Gynecology   Flu vaccine need       Relevant Orders   Flu vaccine HIGH DOSE PF (Completed)       Follow up plan: Return for as scheduled.

## 2018-05-24 NOTE — Patient Instructions (Signed)
Follow up as scheduled.  

## 2018-05-28 ENCOUNTER — Encounter: Payer: Self-pay | Admitting: Obstetrics & Gynecology

## 2018-05-30 ENCOUNTER — Encounter: Payer: Self-pay | Admitting: General Surgery

## 2018-05-30 ENCOUNTER — Ambulatory Visit (INDEPENDENT_AMBULATORY_CARE_PROVIDER_SITE_OTHER): Payer: Medicare Other | Admitting: General Surgery

## 2018-05-30 VITALS — BP 122/62 | HR 61 | Resp 16 | Ht <= 58 in | Wt 147.0 lb

## 2018-05-30 DIAGNOSIS — Z170 Estrogen receptor positive status [ER+]: Secondary | ICD-10-CM

## 2018-05-30 DIAGNOSIS — C50812 Malignant neoplasm of overlapping sites of left female breast: Secondary | ICD-10-CM

## 2018-05-30 DIAGNOSIS — Z79811 Long term (current) use of aromatase inhibitors: Secondary | ICD-10-CM

## 2018-05-30 MED ORDER — LETROZOLE 2.5 MG PO TABS: 2.5000 mg | ORAL_TABLET | Freq: Every day | ORAL | 12 refills | Status: DC

## 2018-05-30 NOTE — Patient Instructions (Addendum)
The patient is aware to call back for any questions or new concerns. Follow up in one month Schedule bone density

## 2018-05-30 NOTE — Progress Notes (Signed)
Patient ID: Marie Parker, female   DOB: [birth year], 72 y.o.   MRN: [id number]  Chief Complaint  Patient presents with  . Follow-up    HPI Marie Parker is a 72 y.o. female.  Here for her follow up mammosite treatment. She states radiation went well.  HPI  Past Medical History:  Diagnosis Date  . Anxiety   . Arthritis   . CAD (coronary artery disease)   . Cancer (HCC)    squamous cell  . Chronic kidney disease   . Depression   . Family history of adverse reaction to anesthesia    mother had N/V  . Generalized osteoarthritis   . GERD (gastroesophageal reflux disease)   . Gout   . History of endoscopy 11/2012   Gastritis  . Hyperlipidemia   . Hypertension   . IFG (impaired fasting glucose)   . Insomnia   . Menopause   . Osteoporosis   . Parathyroid adenoma   . Personal history of tobacco use, presenting hazards to health 02/02/2016  . PVD (peripheral vascular disease) ([place])   . PVD (peripheral vascular disease) ([place])   . Rotator cuff syndrome of shoulder and allied disorders   . Tobacco abuse     Past Surgical History:  Procedure Laterality Date  . BREAST BIOPSY Left 2014   neg core  . BREAST BIOPSY Left 04/05/2018  . BREAST EXCISIONAL BIOPSY Left 1970's   neg  . BREAST FIBROADENOMA SURGERY    . BREAST MAMMOSITE Left 04/2018  . CARDIAC CATHETERIZATION  09/17/2007   Insignificant CAD with 40-50% stenosis of mid LAD.   [name] ILIAC ARTERY STENT     right iliac artery stent   . MASTECTOMY, PARTIAL Left 04/22/2018   Procedure: MASTECTOMY PARTIAL;  Surgeon: Robert Bellow, MD;  Location: [facility] ORS;  Service: General;  Laterality: Left;  . ORIF PATELLA Left 03/22/2016   Dr. Tristan Schroeder  . PARATHYROIDECTOMY  2012   [facility]  . SENTINEL NODE BIOPSY Left 04/22/2018   Procedure: SENTINEL NODE BIOPSY;  Surgeon: Robert Bellow, MD;  Location: [facility] ORS;  Service: General;  Laterality: Left;  . SKIN BIOPSY     precancerous    Family History  Problem Relation Age of Onset  .  Heart attack Father 43  . Alcohol abuse Father   . Heart disease Father   . Liver disease Father   . Hypertension Father   . Heart disease Sister 39       CAD; s/p stent placement  . Stroke Sister   . AAA (abdominal aortic aneurysm) Sister   . Dementia Sister   . Stroke Mother   . Hypertension Mother   . Diabetes Mother   . Stroke Maternal Grandmother   . Heart attack Maternal Grandfather   . Stroke Paternal Grandmother   . Breast cancer Maternal Aunt 65  . Breast cancer Paternal Aunt   . Bone cancer Paternal Uncle     Social History Social History   Tobacco Use  . Smoking status: Former Smoker    Packs/day: 1.00    Years: 42.00    Pack years: 42.00    Types: Cigarettes    Last attempt to quit: 08/27/2000    Years since quitting: 17.7  . Smokeless tobacco: Never Used  Substance Use Topics  . Alcohol use: No  . Drug use: No    Allergies  Allergen Reactions  . Crestor [Rosuvastatin] Nausea Only  . Cyclobenzaprine     Vertigo, couldn't focus eyes  .  Erythromycin     GI upset   . Fosamax [Alendronate Sodium] Nausea And Vomiting    Current Outpatient Medications  Medication Sig Dispense Refill  . acetaminophen (TYLENOL) 650 MG CR tablet Take 1,950 mg by mouth daily as needed for pain.    [name] amLODipine (NORVASC) 10 MG tablet TAKE 1 TABLET BY MOUTH  DAILY 90 tablet 1  . aspirin 81 MG tablet Take 1 tablet (81 mg total) by mouth daily. (Patient taking differently: Take 81 mg by mouth at bedtime. ) 90 tablet 3  . atenolol (TENORMIN) 50 MG tablet Take 1 tablet (50 mg total) by mouth daily. 90 tablet 1  . atorvastatin (LIPITOR) 40 MG tablet Take 1 tablet (40 mg total) by mouth daily. (Patient taking differently: Take 40 mg by mouth at bedtime. ) 90 tablet 1  . benazepril (LOTENSIN) 40 MG tablet Take 1 tablet (40 mg total) by mouth daily. 90 tablet 1  . clidinium-chlordiazePOXIDE (LIBRAX) 5-2.5 MG capsule TAKE 1 CAPSULE BY MOUTH TWICE DAILY FOR LUNCH/SUPPER. (Patient taking  differently: Take 1 capsule by mouth daily. ) 180 capsule 1  . clobetasol ointment (TEMOVATE) 4.09 % Apply 1 application topically as needed (rash).   1  . clonazePAM (KLONOPIN) 0.5 MG tablet Take 1 tablet (0.5 mg total) by mouth at bedtime as needed. (Patient taking differently: Take 0.5 mg by mouth daily as needed for anxiety. ) 30 tablet 0  . diclofenac sodium (VOLTAREN) 1 % GEL Apply 2 g topically 4 (four) times daily. (Patient taking differently: Apply 1 application topically daily as needed (pain). ) 100 g 2  . diphenhydrAMINE (BENADRYL) 25 mg capsule Take 25 mg by mouth at bedtime.     . fluticasone (FLONASE) 50 MCG/ACT nasal spray USE 2 SPRAYS IN EACH  NOSTRIL DAILY 48 g 12  . hydrALAZINE (APRESOLINE) 25 MG tablet TAKE 1 TABLET BY MOUTH TWO  TIMES DAILY 180 tablet 1  . niacin 500 MG tablet Take 1,000 mg by mouth at bedtime.    [name] nystatin ointment (MYCOSTATIN) Apply 1 application topically 2 (two) times daily. 30 g 0  . Olopatadine HCl 0.2 % SOLN Place 1 drop into the left eye daily.    . pantoprazole (PROTONIX) 40 MG tablet Take 1 tablet (40 mg total) by mouth daily. (Patient taking differently: Take 40 mg by mouth at bedtime. ) 90 tablet 1  . Probiotic Product (PROBIOTIC-10 PO) Take by mouth daily.    . ranitidine (ZANTAC) 300 MG tablet Take 1 tablet (300 mg total) by mouth 2 (two) times daily. 180 tablet 1  . sertraline (ZOLOFT) 50 MG tablet TAKE 1 TABLET BY MOUTH  DAILY 90 tablet 1  . tiZANidine (ZANAFLEX) 4 MG tablet TAKE 1 TABLET(4 MG) BY MOUTH EVERY 6 HOURS AS NEEDED FOR MUSCLE SPASMS 30 tablet 3  . Turmeric 500 MG TABS Take 500 mg by mouth daily.    [name] letrozole (FEMARA) 2.5 MG tablet Take 1 tablet (2.5 mg total) by mouth daily. 30 tablet 12  . lidocaine-prilocaine (EMLA) cream Apply 1 application topically as needed. Apply to the areola and cover with plastic wrap one hour prior to leaving for surgery. (Patient not taking: Reported on 05/30/2018) 5 g 0   No current  facility-administered medications for this visit.     Review of Systems Review of Systems  Constitutional: Negative.   Respiratory: Negative.   Cardiovascular: Negative.     Blood pressure 122/62, pulse 61, resp. rate 16, height 4\' 10"  (1.473 m),  weight 147 lb (66.7 kg), SpO2 98 %.  Physical Exam Physical Exam  Constitutional: She is oriented to person, place, and time. She appears well-developed and well-nourished.  Pulmonary/Chest:  Left partial mastectomy site healing well, thickening ridge    Neurological: She is alert and oriented to person, place, and time.  Skin: Skin is warm and dry.  Psychiatric: Her behavior is normal.    Data Reviewed Radiation therapy notes.  Mammoprint results reviewed: Low risk, luminal A. < 1.5% benefit from adjuvant chemotherapy. Assessment    Doing well status post breast conservation for a T1b, N0 invasive mammary carcinoma.    Plan    We discussed whether she would like to be evaluated by medical oncology.  Pros and cons of this intervention were reviewed.  At this time comfortable initiating letrozole therapy.    Last bone density 09-09-14.  Discussed letrozole therapy for 5 years.  Discussed calcium and vitamin D use.  She was under the impression since her parathyroid adenoma removal that she could not make use of supplemental calcium.  We will touch base with Dr. Holley Raring in this regard.   Follow up in one month.       HPI, Physical Exam, Assessment and Plan have been scribed under the direction and in the presence of Robert Bellow, MD. Karie Fetch, RN  I have completed the exam and reviewed the above documentation for accuracy and completeness.  I agree with the above.  Haematologist has been used and any errors in dictation or transcription are unintentional.  Hervey Ard, M.D., F.A.C.S.  Marie Parker 05/31/2018, 3:23 PM

## 2018-05-31 ENCOUNTER — Encounter: Payer: Self-pay | Admitting: General Surgery

## 2018-06-11 ENCOUNTER — Ambulatory Visit
Admission: RE | Admit: 2018-06-11 | Discharge: 2018-06-11 | Disposition: A | Discharge status: Home / Self Care | Payer: Medicare Other | Source: Ambulatory Visit | Attending: General Surgery | Admitting: General Surgery

## 2018-06-11 DIAGNOSIS — Z79811 Long term (current) use of aromatase inhibitors: Secondary | ICD-10-CM | POA: Insufficient documentation

## 2018-06-11 DIAGNOSIS — M85851 Other specified disorders of bone density and structure, right thigh: Secondary | ICD-10-CM | POA: Diagnosis not present

## 2018-06-17 ENCOUNTER — Encounter: Payer: Self-pay | Admitting: Family Medicine

## 2018-06-17 DIAGNOSIS — M8580 Other specified disorders of bone density and structure, unspecified site: Secondary | ICD-10-CM | POA: Insufficient documentation

## 2018-06-18 ENCOUNTER — Other Ambulatory Visit: Payer: Self-pay | Admitting: Family Medicine

## 2018-06-18 NOTE — Telephone Encounter (Signed)
Requested medication (s) are due for refill today: yes  Requested medication (s) are on the active medication list: yes  Last refill:  12/27/17 #180 with 1 refill  Future visit scheduled: yes      Notes to clinic:      Requested Prescriptions  Pending Prescriptions Disp Refills   clidinium-chlordiazePOXIDE (LIBRAX) 5-2.5 MG capsule [Pharmacy Med Name: CHLORDIAZEPOXIDE/CLIDINIUM5-2.5MG  C] 180 capsule 0    Sig: TAKE ONE CAPSULE BY MOUTH TWICE DAILY FOR LUNCH/DINNER     Not Delegated - Psychiatry:  Anxiolytics/Hypnotics Failed - 06/18/2018 12:17 PM      Failed - This refill cannot be delegated      Failed - Urine Drug Screen completed in last 360 days.      Passed - Valid encounter within last 6 months    Recent Outpatient Visits          3 weeks ago Vaginal bleeding   [place], [place]   2 months ago Pre-operative clearance   [place], [place]   3 months ago RUQ pain   [place], [place], DO   4 months ago RUQ pain   [place], DO   5 months ago Routine general medical examination at a health care facility   [place], Barb Merino, DO      Future Appointments            In 2 weeks Wynetta Emery, Barb Merino, DO [place], [place]   In 6 months  [employer], [place]

## 2018-06-23 ENCOUNTER — Other Ambulatory Visit: Payer: Self-pay | Admitting: Family Medicine

## 2018-06-24 NOTE — Telephone Encounter (Signed)
Meds reordered with 2 refills for each one.

## 2018-06-26 ENCOUNTER — Encounter: Payer: Self-pay | Admitting: Radiation Oncology

## 2018-06-26 ENCOUNTER — Other Ambulatory Visit: Payer: Self-pay

## 2018-06-26 ENCOUNTER — Ambulatory Visit
Admission: RE | Admit: 2018-06-26 | Discharge: 2018-06-26 | Disposition: A | Discharge status: Home / Self Care | Payer: Medicare Other | Source: Ambulatory Visit | Attending: Radiation Oncology | Admitting: Radiation Oncology

## 2018-06-26 VITALS — BP 143/83 | HR 61 | Temp 98.2°F | Resp 18 | Wt 146.5 lb

## 2018-06-26 DIAGNOSIS — Z79811 Long term (current) use of aromatase inhibitors: Secondary | ICD-10-CM | POA: Insufficient documentation

## 2018-06-26 DIAGNOSIS — C50812 Malignant neoplasm of overlapping sites of left female breast: Secondary | ICD-10-CM | POA: Diagnosis not present

## 2018-06-26 DIAGNOSIS — Z170 Estrogen receptor positive status [ER+]: Secondary | ICD-10-CM | POA: Insufficient documentation

## 2018-06-26 DIAGNOSIS — Z923 Personal history of irradiation: Secondary | ICD-10-CM | POA: Insufficient documentation

## 2018-06-26 NOTE — Progress Notes (Signed)
Radiation Oncology Follow up Note  Name: Marie Parker   Date:   06/26/2018 MRN:  [id number] DOB: 1945/09/14    This 72 y.o. female presents to the clinic today for one-month follow-up status post accelerated partial breast radiation to her left breast for stage I ER/PR positiveinvasive mammary carcinoma.  REFERRING PROVIDER: Valerie Roys, DO  HPI: patient is a 72 year old female now out 1 month having completed accelerated partial breast radiation to her left breast.for stage I9T1 CN 0 M0) ER/PR positive HER-2/neu negative invasive mammary carcinoma. Seen today in routine follow up she is doing well. She specifically denies breast tenderness cough or bone pain she does have firmness and a seroma cavity consistent with HDR.she's been started on Femara tolerating that well without side effect.  COMPLICATIONS OF TREATMENT: none  FOLLOW UP COMPLIANCE: keeps appointments   PHYSICAL EXAM:  BP (!) 143/83 (BP Location: Left Arm, Patient Position: Sitting)   Pulse 61   Temp 98.2 F (36.8 C) (Tympanic)   Resp 18   Wt 146 lb 7.9 oz (66.4 kg)   BMI 30.62 kg/m  Lungs are clear to A&P cardiac examination essentially unremarkable with regular rate and rhythm. No dominant mass or nodularity is noted in either breast in 2 positions examined. Incision is well-healed. No axillary or supraclavicular adenopathy is appreciated. Cosmetic result is excellent.patient does have a firm seroma in the lumpectomy cavity she also has a well-circumscribed nontender mass around the 5:00 position of the left breast.Well-developed well-nourished patient in NAD. HEENT reveals PERLA, EOMI, discs not visualized.  Oral cavity is clear. No oral mucosal lesions are identified. Neck is clear without evidence of cervical or supraclavicular adenopathy. Lungs are clear to A&P. Cardiac examination is essentially unremarkable with regular rate and rhythm without murmur rub or thrill. Abdomen is benign with no organomegaly or  masses noted. Motor sensory and DTR levels are equal and symmetric in the upper and lower extremities. Cranial nerves II through XII are grossly intact. Proprioception is intact. No peripheral adenopathy or edema is identified. No motor or sensory levels are noted. Crude visual fields are within normal range.  RADIOLOGY RESULTS: no current films for review  PLAN: at the present time patient is doing well. She will see Dr. Tollie Pizza next week for follow-up. She has changes consistent with high dose rate remote afterloading. I'm please were overall progress. She continues on Femara without side effect. I have asked to see her back in 4-5 months for follow-up. Patient is to call with any concerns at any time.  I would like to take this opportunity to thank you for allowing me to participate in the care of your patient.Noreene Filbert, MD

## 2018-07-02 ENCOUNTER — Ambulatory Visit: Payer: Self-pay | Admitting: Family Medicine

## 2018-07-03 DIAGNOSIS — Z7689 Persons encountering health services in other specified circumstances: Secondary | ICD-10-CM | POA: Diagnosis not present

## 2018-07-04 ENCOUNTER — Other Ambulatory Visit: Payer: Self-pay

## 2018-07-04 ENCOUNTER — Ambulatory Visit (INDEPENDENT_AMBULATORY_CARE_PROVIDER_SITE_OTHER): Payer: Medicare Other | Admitting: Family Medicine

## 2018-07-04 ENCOUNTER — Encounter: Payer: Self-pay | Admitting: General Surgery

## 2018-07-04 ENCOUNTER — Encounter: Payer: Self-pay | Admitting: Family Medicine

## 2018-07-04 ENCOUNTER — Ambulatory Visit (INDEPENDENT_AMBULATORY_CARE_PROVIDER_SITE_OTHER): Payer: Medicare Other | Admitting: General Surgery

## 2018-07-04 VITALS — BP 127/75 | HR 63 | Temp 98.4°F | Ht 59.0 in | Wt 145.8 lb

## 2018-07-04 VITALS — BP 144/79 | HR 63 | Temp 97.7°F | Resp 13 | Ht 59.0 in | Wt 146.0 lb

## 2018-07-04 DIAGNOSIS — E782 Mixed hyperlipidemia: Secondary | ICD-10-CM | POA: Diagnosis not present

## 2018-07-04 DIAGNOSIS — M1A9XX0 Chronic gout, unspecified, without tophus (tophi): Secondary | ICD-10-CM | POA: Diagnosis not present

## 2018-07-04 DIAGNOSIS — I129 Hypertensive chronic kidney disease with stage 1 through stage 4 chronic kidney disease, or unspecified chronic kidney disease: Secondary | ICD-10-CM | POA: Diagnosis not present

## 2018-07-04 DIAGNOSIS — F419 Anxiety disorder, unspecified: Secondary | ICD-10-CM

## 2018-07-04 DIAGNOSIS — C50812 Malignant neoplasm of overlapping sites of left female breast: Secondary | ICD-10-CM

## 2018-07-04 DIAGNOSIS — Z170 Estrogen receptor positive status [ER+]: Secondary | ICD-10-CM

## 2018-07-04 MED ORDER — SERTRALINE HCL 50 MG PO TABS: 50.0000 mg | ORAL_TABLET | Freq: Every day | ORAL | 1 refills | Status: DC

## 2018-07-04 MED ORDER — BENAZEPRIL HCL 40 MG PO TABS: 40.0000 mg | ORAL_TABLET | Freq: Every day | ORAL | 1 refills | Status: DC

## 2018-07-04 MED ORDER — DICLOFENAC SODIUM 1 % TD GEL: 2.0000 g | Freq: Four times a day (QID) | TRANSDERMAL | 2 refills | Status: DC

## 2018-07-04 MED ORDER — ATORVASTATIN CALCIUM 40 MG PO TABS: 40.0000 mg | ORAL_TABLET | Freq: Every day | ORAL | 1 refills | Status: DC

## 2018-07-04 MED ORDER — ATENOLOL 50 MG PO TABS: 50.0000 mg | ORAL_TABLET | Freq: Every day | ORAL | 1 refills | Status: DC

## 2018-07-04 MED ORDER — CLONAZEPAM 0.5 MG PO TABS: 0.5000 mg | ORAL_TABLET | Freq: Every day | ORAL | 0 refills | Status: DC | PRN

## 2018-07-04 NOTE — Progress Notes (Signed)
Patient ID: Marie Parker, female   DOB: [birth year], 72 y.o.   MRN: [id number]  Chief Complaint  Patient presents with  . Breast Cancer    HPI Marie Parker is a 72 y.o. female here today for her follow up breast cancer check up. Patient states she is doing well.  Good tolerance of Femara. HPI  Past Medical History:  Diagnosis Date  . Anxiety   . Arthritis   . CAD (coronary artery disease)   . Cancer (HCC)    squamous cell  . Chronic kidney disease   . Depression   . Family history of adverse reaction to anesthesia    mother had N/V  . Generalized osteoarthritis   . GERD (gastroesophageal reflux disease)   . Gout   . History of endoscopy 11/2012   Gastritis  . Hyperlipidemia   . Hypertension   . IFG (impaired fasting glucose)   . Insomnia   . Menopause   . Osteoporosis   . Parathyroid adenoma   . Personal history of tobacco use, presenting hazards to health 02/02/2016  . PVD (peripheral vascular disease) ([place])   . PVD (peripheral vascular disease) ([place])   . Rotator cuff syndrome of shoulder and allied disorders   . Tobacco abuse     Past Surgical History:  Procedure Laterality Date  . BREAST BIOPSY Left 2014   neg core  . BREAST BIOPSY Left 04/05/2018  . BREAST EXCISIONAL BIOPSY Left 1970's   neg  . BREAST FIBROADENOMA SURGERY    . BREAST MAMMOSITE Left 04/2018  . CARDIAC CATHETERIZATION  09/17/2007   Insignificant CAD with 40-50% stenosis of mid LAD.   [name] ILIAC ARTERY STENT     right iliac artery stent   . MASTECTOMY, PARTIAL Left 04/22/2018   Procedure: MASTECTOMY PARTIAL;  Surgeon: Robert Bellow, MD;  Location: [facility] ORS;  Service: General;  Laterality: Left;  . ORIF PATELLA Left 03/22/2016   Dr. Tristan Schroeder  . PARATHYROIDECTOMY  2012   [facility]  . SENTINEL NODE BIOPSY Left 04/22/2018   Procedure: SENTINEL NODE BIOPSY;  Surgeon: Robert Bellow, MD;  Location: [facility] ORS;  Service: General;  Laterality: Left;  . SKIN BIOPSY     precancerous    Family  History  Problem Relation Age of Onset  . Heart attack Father 73  . Alcohol abuse Father   . Heart disease Father   . Liver disease Father   . Hypertension Father   . Heart disease Sister 65       CAD; s/p stent placement  . Stroke Sister   . AAA (abdominal aortic aneurysm) Sister   . Dementia Sister   . Stroke Mother   . Hypertension Mother   . Diabetes Mother   . Stroke Maternal Grandmother   . Heart attack Maternal Grandfather   . Stroke Paternal Grandmother   . Breast cancer Maternal Aunt 65  . Breast cancer Paternal Aunt   . Bone cancer Paternal Uncle     Social History Social History   Tobacco Use  . Smoking status: Former Smoker    Packs/day: 1.00    Years: 42.00    Pack years: 42.00    Types: Cigarettes    Last attempt to quit: 08/27/2000    Years since quitting: 17.8  . Smokeless tobacco: Never Used  Substance Use Topics  . Alcohol use: No  . Drug use: No    Allergies  Allergen Reactions  . Crestor [Rosuvastatin] Nausea Only  . Cyclobenzaprine  Vertigo, couldn't focus eyes  . Erythromycin     GI upset   . Fosamax [Alendronate Sodium] Nausea And Vomiting    Current Outpatient Medications  Medication Sig Dispense Refill  . acetaminophen (TYLENOL) 650 MG CR tablet Take 1,950 mg by mouth daily as needed for pain.    [name] amLODipine (NORVASC) 10 MG tablet TAKE 1 TABLET BY MOUTH  DAILY 90 tablet 2  . aspirin 81 MG tablet Take 1 tablet (81 mg total) by mouth daily. (Patient taking differently: Take 81 mg by mouth at bedtime. ) 90 tablet 3  . atenolol (TENORMIN) 50 MG tablet Take 1 tablet (50 mg total) by mouth daily. 90 tablet 1  . atorvastatin (LIPITOR) 40 MG tablet Take 1 tablet (40 mg total) by mouth daily. 90 tablet 1  . benazepril (LOTENSIN) 40 MG tablet Take 1 tablet (40 mg total) by mouth daily. 90 tablet 1  . CALCIUM PO Take by mouth.    . clidinium-chlordiazePOXIDE (LIBRAX) 5-2.5 MG capsule TAKE ONE CAPSULE BY MOUTH TWICE DAILY FOR LUNCH/DINNER  180 capsule 0  . clonazePAM (KLONOPIN) 0.5 MG tablet Take 1 tablet (0.5 mg total) by mouth daily as needed for anxiety. 30 tablet 0  . diclofenac sodium (VOLTAREN) 1 % GEL Apply 2 g topically 4 (four) times daily. 100 g 2  . diphenhydrAMINE (BENADRYL) 25 mg capsule Take 25 mg by mouth at bedtime.     . fluticasone (FLONASE) 50 MCG/ACT nasal spray USE 2 SPRAYS IN EACH  NOSTRIL DAILY 48 g 12  . hydrALAZINE (APRESOLINE) 25 MG tablet TAKE 1 TABLET BY MOUTH TWO  TIMES DAILY 180 tablet 2  . letrozole (FEMARA) 2.5 MG tablet Take 1 tablet (2.5 mg total) by mouth daily. 30 tablet 12  . niacin 500 MG tablet Take 1,000 mg by mouth at bedtime.    [name] nystatin ointment (MYCOSTATIN) Apply 1 application topically 2 (two) times daily. 30 g 0  . Olopatadine HCl 0.2 % SOLN Place 1 drop into the left eye daily.    . pantoprazole (PROTONIX) 40 MG tablet TAKE 1 TABLET BY MOUTH  DAILY 90 tablet 2  . Probiotic Product (PROBIOTIC-10 PO) Take by mouth daily.    . sertraline (ZOLOFT) 50 MG tablet Take 1 tablet (50 mg total) by mouth daily. 90 tablet 1  . tiZANidine (ZANAFLEX) 4 MG tablet TAKE 1 TABLET(4 MG) BY MOUTH EVERY 6 HOURS AS NEEDED FOR MUSCLE SPASMS 30 tablet 3  . Turmeric 500 MG TABS Take 500 mg by mouth daily.     No current facility-administered medications for this visit.     Review of Systems Review of Systems  Blood pressure (!) 144/79, pulse 63, temperature 97.7 F (36.5 C), temperature source Skin, resp. rate 13, height 4\' 11"  (1.499 m), weight 146 lb (66.2 kg), SpO2 98 %.  Physical Exam Physical Exam  Pulmonary/Chest:    Data: Bone density exam dated June 11, 2018 showed evidence of osteopenia, some progression since 2016 exam.   Assessment    Doing well post wide excision of her early stage left breast cancer.    Plan  The opportunit for medical oncology assessment was again reviewed.  Patient declines at this time.    The patient will be contacted to confirm she is taking at  least 1200 mg of elemental calcium and vitamin D daily.  Return in three months.The patient is aware to call back for any questions or concerns.  HPI, Physical Exam, Assessment and Plan have been  scribed under the direction and in the presence of Hervey Ard, MD.  Gaspar Cola, CMA  I have completed the exam and reviewed the above documentation for accuracy and completeness.  I agree with the above.  Haematologist has been used and any errors in dictation or transcription are unintentional.  Hervey Ard, M.D., F.A.C.S.  Forest Gleason Rael Yo 07/04/2018, 9:19 PM

## 2018-07-04 NOTE — Progress Notes (Signed)
BP 127/75 (BP Location: Left Arm, Patient Position: Sitting, Cuff Size: Normal)   Pulse 63   Temp 98.4 F (36.9 C) (Oral)   Ht 4\' 11"  (1.499 m)   Wt 145 lb 12.8 oz (66.1 kg)   SpO2 98%   BMI 29.45 kg/m    Subjective:    Patient ID: Marie Parker, female    DOB: 1945-11-16, 72 y.o.   MRN: [id number]  HPI: Marie Parker is a 72 y.o. female  Chief Complaint  Patient presents with  . Hypertension  . Hyperlipidemia  . Anxiety   HYPERTENSION / HYPERLIPIDEMIA Satisfied with current treatment? yes Duration of hypertension: chronic BP monitoring frequency: not checking BP medication side effects: no Past BP meds: hydralazine, atneolol, benazepril, amlodipine Duration of hyperlipidemia: chronic Cholesterol medication side effects: no Cholesterol supplements: none Past cholesterol medications: atorvastatin Medication compliance: excellent compliance Aspirin: yes Recent stressors: no Recurrent headaches: no Visual changes: no Palpitations: no Dyspnea: no Chest pain: no Lower extremity edema: no Dizzy/lightheaded: no  ANXIETY/STRESS Duration:stable Anxious mood: yes  Excessive worrying: yes Irritability: no  Sweating: no Nausea: no Palpitations:no Hyperventilation: no Panic attacks: no Agoraphobia: no  Obscessions/compulsions: no Depressed mood: yes Depression screen [facility] 2/9 05/24/2018 12/24/2017 08/29/2017 06/26/2017 12/21/2016  Decreased Interest 0 0 0 0 0  Down, Depressed, Hopeless 1 0 0 0 0  PHQ - 2 Score 1 0 0 0 0  Altered sleeping 0 - - - -  Tired, decreased energy 0 - - - -  Change in appetite 0 - - - -  Feeling bad or failure about yourself  0 - - - -  Trouble concentrating 0 - - - -  Moving slowly or fidgety/restless 0 - - - -  Suicidal thoughts 0 - - - -  PHQ-9 Score 1 - - - -  Difficult doing work/chores - - - - -   Anhedonia: no Weight changes: no Insomnia: no   Hypersomnia: no Fatigue/loss of energy: yes Feelings of worthlessness:  yes Feelings of guilt: yes Impaired concentration/indecisiveness: no Suicidal ideations: no  Crying spells: yes Recent Stressors/Life Changes: yes   Relationship problems: yes   Family stress: yes     Financial stress: yes    Job stress: yes    Recent death/loss: no  Gout- no gout flares, feeling well. No other concerns or complaints at this time.   Relevant past medical, surgical, family and social history reviewed and updated as indicated. Interim medical history since our last visit reviewed. Allergies and medications reviewed and updated.  Review of Systems  Constitutional: Negative.   Respiratory: Negative.   Cardiovascular: Negative.   Musculoskeletal: Negative.   Skin: Negative.   Neurological: Negative.   Psychiatric/Behavioral: Negative for agitation, behavioral problems, confusion, decreased concentration, dysphoric mood, hallucinations, self-injury, sleep disturbance and suicidal ideas. The patient is nervous/anxious. The patient is not hyperactive.     Per HPI unless specifically indicated above     Objective:    BP 127/75 (BP Location: Left Arm, Patient Position: Sitting, Cuff Size: Normal)   Pulse 63   Temp 98.4 F (36.9 C) (Oral)   Ht 4\' 11"  (1.499 m)   Wt 145 lb 12.8 oz (66.1 kg)   SpO2 98%   BMI 29.45 kg/m   Wt Readings from Last 3 Encounters:  07/04/18 146 lb (66.2 kg)  07/04/18 145 lb 12.8 oz (66.1 kg)  06/26/18 146 lb 7.9 oz (66.4 kg)    Physical Exam  Constitutional: She is oriented to person,  place, and time. She appears well-developed and well-nourished. No distress.  HENT:  Head: Normocephalic and atraumatic.  Right Ear: Hearing normal.  Left Ear: Hearing normal.  Nose: Nose normal.  Eyes: Conjunctivae and lids are normal. Right eye exhibits no discharge. Left eye exhibits no discharge. No scleral icterus.  Cardiovascular: Normal rate, regular rhythm, normal heart sounds and intact distal pulses. Exam reveals no gallop and no friction rub.   No murmur heard. Pulmonary/Chest: Effort normal and breath sounds normal. No stridor. No respiratory distress. She has no wheezes. She has no rales. She exhibits no tenderness.  Musculoskeletal: Normal range of motion.  Neurological: She is alert and oriented to person, place, and time.  Skin: Skin is warm, dry and intact. Capillary refill takes less than 2 seconds. No rash noted. She is not diaphoretic. No erythema. No pallor.  Psychiatric: She has a normal mood and affect. Her speech is normal and behavior is normal. Judgment and thought content normal. Cognition and memory are normal.  Nursing note and vitals reviewed.   Results for orders placed or performed in visit on 07/04/18  CBC with Differential/Platelet  Result Value Ref Range   WBC 4.2 3.4 - 10.8 x10E3/uL   RBC 4.09 3.77 - 5.28 x10E6/uL   Hemoglobin 12.0 11.1 - 15.9 g/dL   Hematocrit 36.8 34.0 - 46.6 %   MCV 90 79 - 97 fL   MCH 29.3 26.6 - 33.0 pg   MCHC 32.6 31.5 - 35.7 g/dL   RDW 13.2 12.3 - 15.4 %   Platelets 194 150 - 450 x10E3/uL   Neutrophils 63 Not Estab. %   Lymphs 24 Not Estab. %   Monocytes 9 Not Estab. %   Eos 4 Not Estab. %   Basos 0 Not Estab. %   Neutrophils Absolute 2.6 1.4 - 7.0 x10E3/uL   Lymphocytes Absolute 1.0 0.7 - 3.1 x10E3/uL   Monocytes Absolute 0.4 0.1 - 0.9 x10E3/uL   EOS (ABSOLUTE) 0.2 0.0 - 0.4 x10E3/uL   Basophils Absolute 0.0 0.0 - 0.2 x10E3/uL   Immature Granulocytes 0 Not Estab. %   Immature Grans (Abs) 0.0 0.0 - 0.1 x10E3/uL  Comprehensive metabolic panel  Result Value Ref Range   Glucose 91 65 - 99 mg/dL   BUN 13 8 - 27 mg/dL   Creatinine, Ser 0.86 0.57 - 1.00 mg/dL   GFR calc non Af Amer 68 >59 mL/min/1.73   GFR calc Af Amer 79 >59 mL/min/1.73   BUN/Creatinine Ratio 15 12 - 28   Sodium 142 134 - 144 mmol/L   Potassium 3.9 3.5 - 5.2 mmol/L   Chloride 100 96 - 106 mmol/L   CO2 24 20 - 29 mmol/L   Calcium 9.2 8.7 - 10.3 mg/dL   Total Protein 6.6 6.0 - 8.5 g/dL   Albumin 4.4  3.5 - 4.8 g/dL   Globulin, Total 2.2 1.5 - 4.5 g/dL   Albumin/Globulin Ratio 2.0 1.2 - 2.2   Bilirubin Total 0.3 0.0 - 1.2 mg/dL   Alkaline Phosphatase 84 39 - 117 IU/L   AST 26 0 - 40 IU/L   ALT 17 0 - 32 IU/L  Lipid Panel w/o Chol/HDL Ratio  Result Value Ref Range   Cholesterol, Total 125 100 - 199 mg/dL   Triglycerides 77 0 - 149 mg/dL   HDL 55 >39 mg/dL   VLDL Cholesterol Cal 15 5 - 40 mg/dL   LDL Calculated 55 0 - 99 mg/dL  Uric acid  Result Value Ref Range  Uric Acid 6.2 2.5 - 7.1 mg/dL      Assessment & Plan:   Problem List Items Addressed This Visit      Genitourinary   Benign hypertensive renal disease - Primary    Under good control on current regimen. Continue current regimen. Continue to monitor. Call with any concerns. Refills given. Labs checked today.       Relevant Orders   CBC with Differential/Platelet (Completed)   Comprehensive metabolic panel (Completed)   Microalbumin, Urine Waived     Other   Hyperlipidemia    Under good control on current regimen. Continue current regimen. Continue to monitor. Call with any concerns. Refills given. Labs checked today.       Relevant Medications   atenolol (TENORMIN) 50 MG tablet   atorvastatin (LIPITOR) 40 MG tablet   benazepril (LOTENSIN) 40 MG tablet   Other Relevant Orders   CBC with Differential/Platelet (Completed)   Comprehensive metabolic panel (Completed)   Lipid Panel w/o Chol/HDL Ratio (Completed)   Gout    Under good control on current regimen. Continue current regimen. Continue to monitor. Call with any concerns. Refills given. Labs checked today.       Relevant Orders   Uric acid (Completed)   Acute anxiety    Stable. Concerned that her husband has prostate cancer and is very worried. She is taking her medicine occasionally. Needs a refill of her clonazepam. Rx should last 3-6 months. Call with any concerns. Refills given today.      Relevant Medications   sertraline (ZOLOFT) 50 MG  tablet   Other Relevant Orders   CBC with Differential/Platelet (Completed)   Comprehensive metabolic panel (Completed)       Follow up plan: Return in about 6 months (around 01/02/2019) for Wellness/Physical.

## 2018-07-04 NOTE — Patient Instructions (Signed)
Return in three months.The patient is aware to call back for any questions or concerns.  

## 2018-07-05 LAB — CBC WITH DIFFERENTIAL/PLATELET
Basophils Absolute: 0 10*3/uL (ref 0.0–0.2)
Basos: 0 %
EOS (ABSOLUTE): 0.2 10*3/uL (ref 0.0–0.4)
EOS: 4 %
HEMATOCRIT: 36.8 % (ref 34.0–46.6)
HEMOGLOBIN: 12 g/dL (ref 11.1–15.9)
IMMATURE GRANS (ABS): 0 10*3/uL (ref 0.0–0.1)
IMMATURE GRANULOCYTES: 0 %
LYMPHS ABS: 1 10*3/uL (ref 0.7–3.1)
LYMPHS: 24 %
MCH: 29.3 pg (ref 26.6–33.0)
MCHC: 32.6 g/dL (ref 31.5–35.7)
MCV: 90 fL (ref 79–97)
MONOCYTES: 9 %
Monocytes Absolute: 0.4 10*3/uL (ref 0.1–0.9)
Neutrophils Absolute: 2.6 10*3/uL (ref 1.4–7.0)
Neutrophils: 63 %
Platelets: 194 10*3/uL (ref 150–450)
RBC: 4.09 x10E6/uL (ref 3.77–5.28)
RDW: 13.2 % (ref 12.3–15.4)
WBC: 4.2 10*3/uL (ref 3.4–10.8)

## 2018-07-05 LAB — LIPID PANEL W/O CHOL/HDL RATIO
Cholesterol, Total: 125 mg/dL (ref 100–199)
HDL: 55 mg/dL (ref >39)
LDL CALC: 55 mg/dL (ref 0–99)
Triglycerides: 77 mg/dL (ref 0–149)
VLDL CHOLESTEROL CAL: 15 mg/dL (ref 5–40)

## 2018-07-05 LAB — COMPREHENSIVE METABOLIC PANEL
ALBUMIN: 4.4 g/dL (ref 3.5–4.8)
ALT: 17 IU/L (ref 0–32)
AST: 26 IU/L (ref 0–40)
Albumin/Globulin Ratio: 2 (ref 1.2–2.2)
Alkaline Phosphatase: 84 IU/L (ref 39–117)
BUN/Creatinine Ratio: 15 (ref 12–28)
BUN: 13 mg/dL (ref 8–27)
Bilirubin Total: 0.3 mg/dL (ref 0.0–1.2)
CO2: 24 mmol/L (ref 20–29)
CREATININE: 0.86 mg/dL (ref 0.57–1.00)
Calcium: 9.2 mg/dL (ref 8.7–10.3)
Chloride: 100 mmol/L (ref 96–106)
GFR, EST AFRICAN AMERICAN: 79 mL/min/{1.73_m2} (ref >59)
GFR, EST NON AFRICAN AMERICAN: 68 mL/min/{1.73_m2} (ref >59)
GLOBULIN, TOTAL: 2.2 g/dL (ref 1.5–4.5)
Glucose: 91 mg/dL (ref 65–99)
Potassium: 3.9 mmol/L (ref 3.5–5.2)
SODIUM: 142 mmol/L (ref 134–144)
TOTAL PROTEIN: 6.6 g/dL (ref 6.0–8.5)

## 2018-07-05 LAB — URIC ACID: URIC ACID: 6.2 mg/dL (ref 2.5–7.1)

## 2018-07-06 ENCOUNTER — Encounter: Payer: Self-pay | Admitting: Family Medicine

## 2018-07-06 NOTE — Assessment & Plan Note (Signed)
Under good control on current regimen. Continue current regimen. Continue to monitor. Call with any concerns. Refills given. Labs checked today.  

## 2018-07-06 NOTE — Assessment & Plan Note (Addendum)
Stable. Concerned that her husband has prostate cancer and is very worried. She is taking her medicine occasionally. Needs a refill of her clonazepam. Rx should last 3-6 months. Call with any concerns. Refills given today.

## 2018-08-18 ENCOUNTER — Other Ambulatory Visit: Payer: Self-pay | Admitting: Family Medicine

## 2018-08-28 DIAGNOSIS — Z923 Personal history of irradiation: Secondary | ICD-10-CM

## 2018-08-28 HISTORY — DX: Personal history of irradiation: Z92.3

## 2018-09-17 DIAGNOSIS — H2513 Age-related nuclear cataract, bilateral: Secondary | ICD-10-CM | POA: Diagnosis not present

## 2018-10-10 ENCOUNTER — Ambulatory Visit: Payer: Medicare Other | Admitting: General Surgery

## 2018-10-10 ENCOUNTER — Encounter: Payer: Self-pay | Admitting: General Surgery

## 2018-10-10 ENCOUNTER — Other Ambulatory Visit: Payer: Self-pay

## 2018-10-10 ENCOUNTER — Ambulatory Visit: Payer: Self-pay | Admitting: General Surgery

## 2018-10-10 VITALS — BP 108/58 | HR 65 | Resp 12 | Ht 59.0 in | Wt 139.0 lb

## 2018-10-10 DIAGNOSIS — C50812 Malignant neoplasm of overlapping sites of left female breast: Secondary | ICD-10-CM

## 2018-10-10 DIAGNOSIS — Z170 Estrogen receptor positive status [ER+]: Secondary | ICD-10-CM | POA: Diagnosis not present

## 2018-10-10 NOTE — Progress Notes (Signed)
Patient ID: Marie Parker, female   DOB: [birth year], 73 y.o.   MRN: [id number]  Chief Complaint  Patient presents with  . Follow-up    /u breast ca recall no mammos    HPI Marie Parker is a 73 y.o. female. here for her left breast cancer follow up. She is overwhelmed at how well she feels. No new breast issues. Tolerating the letrozole.  HPI  Past Medical History:  Diagnosis Date  . Anxiety   . Arthritis   . Breast cancer of upper-outer quadrant of left female breast ([place]) 04/22/2018   16 mm, T1c, N0.  ER/PR positive, HER-2/neu not overexpressed.  Low MammaPrint score.    [name] CAD (coronary artery disease)   . Cancer (HCC)    squamous cell  . Chronic kidney disease   . Colon cancer screening 2017?   negative cologuard  . Depression   . Family history of adverse reaction to anesthesia    mother had N/V  . Generalized osteoarthritis   . GERD (gastroesophageal reflux disease)   . Gout   . History of endoscopy 11/2012   Gastritis  . Hyperlipidemia   . Hypertension   . IFG (impaired fasting glucose)   . Insomnia   . Menopause   . Osteoporosis   . Parathyroid adenoma   . Personal history of tobacco use, presenting hazards to health 02/02/2016  . PVD (peripheral vascular disease) ([place])   . PVD (peripheral vascular disease) ([place])   . Rotator cuff syndrome of shoulder and allied disorders   . Tobacco abuse     Past Surgical History:  Procedure Laterality Date  . BREAST BIOPSY Left 2014   neg core  . BREAST BIOPSY Left 04/05/2018  . BREAST EXCISIONAL BIOPSY Left 1970's   neg  . BREAST FIBROADENOMA SURGERY    . BREAST MAMMOSITE Left 04/2018  . CARDIAC CATHETERIZATION  09/17/2007   Insignificant CAD with 40-50% stenosis of mid LAD.   [name] ILIAC ARTERY STENT     right iliac artery stent   . MASTECTOMY, PARTIAL Left 04/22/2018   Procedure: MASTECTOMY PARTIAL;  Surgeon: Robert Bellow, MD;  Location: [facility] ORS;  Service: General;  Laterality: Left;  . ORIF PATELLA Left  03/22/2016   Dr. Tristan Schroeder  . PARATHYROIDECTOMY  2012   [facility]  . SENTINEL NODE BIOPSY Left 04/22/2018   Procedure: SENTINEL NODE BIOPSY;  Surgeon: Robert Bellow, MD;  Location: [facility] ORS;  Service: General;  Laterality: Left;  . SKIN BIOPSY     precancerous    Family History  Problem Relation Age of Onset  . Heart attack Father 72  . Alcohol abuse Father   . Heart disease Father   . Liver disease Father   . Hypertension Father   . Heart disease Sister 48       CAD; s/p stent placement  . Stroke Sister   . AAA (abdominal aortic aneurysm) Sister   . Dementia Sister   . Stroke Mother   . Hypertension Mother   . Diabetes Mother   . Stroke Maternal Grandmother   . Heart attack Maternal Grandfather   . Stroke Paternal Grandmother   . Breast cancer Maternal Aunt 65  . Breast cancer Paternal Aunt   . Bone cancer Paternal Uncle     Social History Social History   Tobacco Use  . Smoking status: Former Smoker    Packs/day: 1.00    Years: 42.00    Pack years: 42.00    Types: Cigarettes  Last attempt to quit: 08/27/2000    Years since quitting: 18.1  . Smokeless tobacco: Never Used  Substance Use Topics  . Alcohol use: No  . Drug use: No    Allergies  Allergen Reactions  . Crestor [Rosuvastatin] Nausea Only  . Cyclobenzaprine     Vertigo, couldn't focus eyes  . Erythromycin     GI upset   . Fosamax [Alendronate Sodium] Nausea And Vomiting    Current Outpatient Medications  Medication Sig Dispense Refill  . acetaminophen (TYLENOL) 650 MG CR tablet Take 1,950 mg by mouth daily as needed for pain.    [name] amLODipine (NORVASC) 10 MG tablet TAKE 1 TABLET BY MOUTH  DAILY 90 tablet 2  . aspirin 81 MG tablet Take 1 tablet (81 mg total) by mouth daily. (Patient taking differently: Take 81 mg by mouth at bedtime. ) 90 tablet 3  . atenolol (TENORMIN) 50 MG tablet Take 1 tablet (50 mg total) by mouth daily. 90 tablet 1  . atorvastatin (LIPITOR) 40 MG tablet Take 1 tablet (40 mg  total) by mouth daily. 90 tablet 1  . benazepril (LOTENSIN) 40 MG tablet Take 1 tablet (40 mg total) by mouth daily. 90 tablet 1  . CALCIUM PO Take by mouth.    . clidinium-chlordiazePOXIDE (LIBRAX) 5-2.5 MG capsule TAKE ONE CAPSULE BY MOUTH TWICE DAILY FOR LUNCH/DINNER 180 capsule 0  . clonazePAM (KLONOPIN) 0.5 MG tablet Take 1 tablet (0.5 mg total) by mouth daily as needed for anxiety. 30 tablet 0  . diclofenac sodium (VOLTAREN) 1 % GEL Apply 2 g topically 4 (four) times daily. 100 g 2  . diphenhydrAMINE (BENADRYL) 25 mg capsule Take 25 mg by mouth at bedtime.     . fluticasone (FLONASE) 50 MCG/ACT nasal spray USE 2 SPRAYS IN EACH  NOSTRIL DAILY 48 g 12  . hydrALAZINE (APRESOLINE) 25 MG tablet TAKE 1 TABLET BY MOUTH TWO  TIMES DAILY 180 tablet 2  . letrozole (FEMARA) 2.5 MG tablet Take 1 tablet (2.5 mg total) by mouth daily. 30 tablet 12  . niacin 500 MG tablet Take 1,000 mg by mouth at bedtime.    [name] nystatin ointment (MYCOSTATIN) Apply 1 application topically 2 (two) times daily. 30 g 0  . Olopatadine HCl 0.2 % SOLN Place 1 drop into the left eye daily.    . pantoprazole (PROTONIX) 40 MG tablet TAKE 1 TABLET BY MOUTH  DAILY 90 tablet 2  . Probiotic Product (PROBIOTIC-10 PO) Take by mouth daily.    . sertraline (ZOLOFT) 50 MG tablet Take 1 tablet (50 mg total) by mouth daily. 90 tablet 1  . tiZANidine (ZANAFLEX) 4 MG tablet TAKE 1 TABLET(4 MG) BY MOUTH EVERY 6 HOURS AS NEEDED FOR MUSCLE SPASMS 30 tablet 3  . Turmeric 500 MG TABS Take 500 mg by mouth daily.     No current facility-administered medications for this visit.     Review of Systems Review of Systems  Constitutional: Negative.   Respiratory: Negative.   Cardiovascular: Negative.     Blood pressure (!) 108/58, pulse 65, resp. rate 12, height 4' 11"  (1.499 m), weight 139 lb (63 kg), SpO2 98 %.  Physical Exam Physical Exam Constitutional:      Appearance: She is well-developed.  Eyes:     General: No scleral icterus.     Conjunctiva/sclera: Conjunctivae normal.  Neck:     Musculoskeletal: Neck supple.  Cardiovascular:     Rate and Rhythm: Normal rate and regular rhythm.  Heart sounds: Normal heart sounds.  Pulmonary:     Effort: Pulmonary effort is normal.     Breath sounds: Normal breath sounds.  Chest:     Breasts:        Right: No inverted nipple, mass, nipple discharge, skin change or tenderness.        Left: No inverted nipple, mass, nipple discharge, skin change or tenderness.       Comments: thickening Lymphadenopathy:     Cervical: No cervical adenopathy.  Skin:    General: Skin is warm and dry.  Neurological:     Mental Status: She is alert and oriented to person, place, and time.  Psychiatric:        Behavior: Behavior normal.     Data Reviewed Bone density exam dated June 11, 2018 showed a T score of -1.7, minimal change from prior year.  Osteopenia of the femoral neck, normal for the femur.  Osteopenia for the forearm.  Assessment    Doing well post breast conservation.    Plan    The patient is aware to call back for any questions or new concerns. Patient to have a bilateral diagnostic mammogram follow up in 6 months.      HPI, assessment, plan and physical exam has been scribed under the direction and in the presence of Robert Bellow, MD. Karie Fetch, RN  I have completed the exam and reviewed the above documentation for accuracy and completeness.  I agree with the above.  Haematologist has been used and any errors in dictation or transcription are unintentional.  Hervey Ard, M.D., F.A.C.S.  [facility] Gleason Doug Bucklin 10/11/2018, 5:32 AM

## 2018-10-10 NOTE — Patient Instructions (Addendum)
The patient is aware to call back for any questions or new concerns. Patient to have a bilateral diagnostic mammogram follow up in 6 months.

## 2018-10-11 ENCOUNTER — Encounter: Payer: Self-pay | Admitting: General Surgery

## 2018-10-29 ENCOUNTER — Encounter: Payer: Self-pay | Admitting: General Surgery

## 2018-10-29 NOTE — Progress Notes (Signed)
Records from the patient's July 22, 2012 colonoscopy completed at [facility] by Jill Side, MD was reviewed.  The patient was found to have tubular adenomas in both the descending colon and the cecum.  These were each less than 1 cm in size.  The patient would be a candidate for a follow-up colonoscopy at this time based on those findings.  She will be notified of this recommendation.

## 2018-10-31 ENCOUNTER — Telehealth: Payer: Self-pay

## 2018-10-31 NOTE — Telephone Encounter (Signed)
Notified patient as instructed, patient pleased. Discussed follow-up appointments, patient agrees. She says that she has only had the Cologuard about 3 years ago but no colonoscopies. She would like to wait until she gets her physical in May and then she will have her provider set it up at that time.

## 2018-10-31 NOTE — Telephone Encounter (Signed)
-----   Message from Robert Bellow, MD sent at 10/29/2018  4:32 PM EST ----- Please notify the patient that I reviewed her 2013 colonoscopy completed by Dr. Dionne Milo.  She had two polyps, and should should have a repeat colonoscopy now unless one has been done elsewhere in the interval.  She can have this completed thru our office or we can refer her to GI.  Thanks.

## 2018-11-15 ENCOUNTER — Encounter: Payer: Self-pay | Admitting: *Deleted

## 2018-11-23 ENCOUNTER — Other Ambulatory Visit: Payer: Self-pay | Admitting: Family Medicine

## 2018-11-24 ENCOUNTER — Other Ambulatory Visit: Payer: Self-pay | Admitting: Family Medicine

## 2018-11-24 MED ORDER — CILIDINIUM-CHLORDIAZEPOXIDE 2.5-5 MG PO CAPS: ORAL_CAPSULE | ORAL | 0 refills | Status: DC

## 2018-11-26 ENCOUNTER — Telehealth: Payer: Self-pay

## 2018-11-26 NOTE — Telephone Encounter (Signed)
Please let her know. She takes it occasionally so may pay out of pocket.

## 2018-11-26 NOTE — Telephone Encounter (Signed)
PA denied. Routing to provider to advise.

## 2018-11-26 NOTE — Telephone Encounter (Signed)
Patient notified of Dr. Johnson's message. Pt verbalized understanding. 

## 2018-11-26 NOTE — Telephone Encounter (Signed)
PA for chlordiazepoxide/clidinium initiated and submitted via Cover My Meds. Key: [id number]

## 2018-11-27 ENCOUNTER — Other Ambulatory Visit: Payer: Self-pay | Admitting: Family Medicine

## 2018-11-27 NOTE — Telephone Encounter (Signed)
Call to patient- left message- Rx was sent to [facility]- Rx request is from [facility]. Please verify correct pharmacy so if need to change the office can forward to different pharmacy.

## 2018-11-27 NOTE — Telephone Encounter (Signed)
Spoke w/ patient RX was sent to [facility], but it is much cheaper at [facility] since she has to pay out of pocket. Called [facility] and requested they call [facility] to have prescription transferred to them.

## 2018-11-27 NOTE — Telephone Encounter (Signed)
Please check pharmacy. This was just refilled.

## 2018-11-28 ENCOUNTER — Ambulatory Visit: Payer: Self-pay | Admitting: Radiation Oncology

## 2018-12-27 ENCOUNTER — Ambulatory Visit: Payer: Self-pay

## 2019-01-01 ENCOUNTER — Ambulatory Visit (INDEPENDENT_AMBULATORY_CARE_PROVIDER_SITE_OTHER): Payer: Medicare Other

## 2019-01-01 ENCOUNTER — Ambulatory Visit: Payer: Self-pay

## 2019-01-01 ENCOUNTER — Other Ambulatory Visit: Payer: Self-pay

## 2019-01-01 VITALS — BP 128/58 | HR 52 | Temp 98.5°F | Ht 59.5 in | Wt 135.5 lb

## 2019-01-01 DIAGNOSIS — Z0000 Encounter for general adult medical examination without abnormal findings: Secondary | ICD-10-CM

## 2019-01-01 NOTE — Progress Notes (Signed)
Subjective:   Marie Parker is a 73 y.o. female who presents for Medicare Annual (Subsequent) preventive examination.  This visit is being conducted via phone call  - after an attmept to do on video chat - due to the COVID-19 pandemic. This patient has given me verbal consent via phone to conduct this visit, patient states they are participating from their home address. Some vital signs may be absent or patient reported.   Patient identification: identified by name, DOB, and current address.    Review of Systems:   Cardiac Risk Factors include: advanced age (>64mn, >>52women);hypertension;dyslipidemia     Objective:     Vitals: BP (!) 128/58 Comment: patient reported  Pulse (!) 52 Comment: patient reported  Temp 98.5 F (36.9 C) Comment: patient reported  Ht 4' 11.5" (1.511 m) Comment: patient reported  Wt 135 lb 8 oz (61.5 kg)   BMI 26.91 kg/m   Body mass index is 26.91 kg/m.  Advanced Directives 01/01/2019 06/26/2018 05/08/2018 04/22/2018 04/15/2018 12/24/2017 12/21/2016  Does Patient Have a Medical Advance Directive? Yes No No Yes Yes Yes Yes  Type of Advance Directive Living will - - Living will Living will Living will Living will  Does patient want to make changes to medical advance directive? - - No - Patient declined No - Patient declined No - Patient declined - No - Patient declined  Copy of [place] Would patient like information on creating a medical advance directive? - No - Patient declined - - - - -    Tobacco Social History   Tobacco Use  Smoking Status Former Smoker  . Packs/day: 1.00  . Years: 42.00  . Pack years: 42.00  . Types: Cigarettes  . Last attempt to quit: 08/28/2000  . Years since quitting: 18.3  Smokeless Tobacco Never Used     Counseling given: Not Answered   Clinical Intake:  Pre-visit preparation completed: Yes  Pain : No/denies pain     Nutritional Status: BMI 25 -29 Overweight  Nutritional Risks: None Diabetes: No  How often do you need to have someone help you when you read instructions, pamphlets, or other written materials from your doctor or pharmacy?: 1 - Never  Interpreter Needed?: No  Information entered by ::  ,LPN   Past Medical History:  Diagnosis Date  . Anxiety   . Arthritis   . Breast cancer of upper-outer quadrant of left female breast ([facility] 04/22/2018   16 mm, T1c, N0.  ER/PR positive, HER-2/neu not overexpressed.  Low MammaPrint score.    .Marland KitchenCAD (coronary artery disease)   . Cancer (HCC)    squamous cell  . Cataract   . Chronic kidney disease   . Colon cancer screening 2017?   negative cologuard  . Depression   . Family history of adverse reaction to anesthesia    mother had N/V  . Generalized osteoarthritis   . GERD (gastroesophageal reflux disease)   . Gout   . History of endoscopy 11/2012   Gastritis  . Hyperlipidemia   . Hypertension   . IFG (impaired fasting glucose)   . Insomnia   . Menopause   . Osteoporosis   . Parathyroid adenoma   . Personal history of tobacco use, presenting hazards to health 02/02/2016  . [place]ipheral vascular disease) ([facility]   . P[place]ipheral vascular disease) ([facility]   . Rotator cuff syndrome of shoulder and allied disorders   .  Tobacco abuse    Past Surgical History:  Procedure Laterality Date  . BREAST BIOPSY Left 2014   neg core  . BREAST BIOPSY Left 04/05/2018  . BREAST EXCISIONAL BIOPSY Left 1970's   neg  . BREAST FIBROADENOMA SURGERY    . BREAST MAMMOSITE Left 04/2018  . CARDIAC CATHETERIZATION  09/17/2007   Insignificant CAD with 40-50% stenosis of mid LAD.   [name] ILIAC ARTERY STENT     right iliac artery stent   . MASTECTOMY, PARTIAL Left 04/22/2018   Procedure: MASTECTOMY PARTIAL;  Surgeon: Robert Bellow, MD;  Location: [facility] ORS;  Service: General;  Laterality: Left;  . ORIF PATELLA Left 03/22/2016   Dr. Tristan Schroeder  . PARATHYROIDECTOMY  2012   [facility]  . SENTINEL NODE  BIOPSY Left 04/22/2018   Procedure: SENTINEL NODE BIOPSY;  Surgeon: Robert Bellow, MD;  Location: [facility] ORS;  Service: General;  Laterality: Left;  . SKIN BIOPSY     precancerous   Family History  Problem Relation Age of Onset  . Heart attack Father 2  . Alcohol abuse Father   . Heart disease Father   . Liver disease Father   . Hypertension Father   . Heart disease Sister 7       CAD; s/p stent placement  . Stroke Sister   . AAA (abdominal aortic aneurysm) Sister   . Dementia Sister   . Stroke Mother   . Hypertension Mother   . Diabetes Mother   . Stroke Maternal Grandmother   . Heart attack Maternal Grandfather   . Stroke Paternal Grandmother   . Breast cancer Maternal Aunt 65  . Breast cancer Paternal Aunt   . Bone cancer Paternal Uncle    Social History   Socioeconomic History  . Marital status: Married    Spouse name: Not on file  . Number of children: Not on file  . Years of education: Not on file  . Highest education level: High school graduate  Occupational History  . Occupation: part time [employer]   Social Needs  . Financial resource strain: Not hard at all  . Food insecurity:    Worry: Never true    Inability: Never true  . Transportation needs:    Medical: No    Non-medical: No  Tobacco Use  . Smoking status: Former Smoker    Packs/day: 1.00    Years: 42.00    Pack years: 42.00    Types: Cigarettes    Last attempt to quit: 08/28/2000    Years since quitting: 18.3  . Smokeless tobacco: Never Used  Substance and Sexual Activity  . Alcohol use: No  . Drug use: No  . Sexual activity: Never  Lifestyle  . Physical activity:    Days per week: 0 days    Minutes per session: 0 min  . Stress: Not at all  Relationships  . Social connections:    Talks on phone: More than three times a week    Gets together: More than three times a week    Attends religious service: More than 4 times per year    Active member of club or organization: Yes     Attends meetings of clubs or organizations: More than 4 times per year    Relationship status: Married  Other Topics Concern  . 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 acetaminophen (TYLENOL) 650 MG CR tablet Take 1,950 mg by mouth daily as needed for pain.  [name] amLODipine (NORVASC) 10 MG tablet TAKE 1 TABLET BY MOUTH  DAILY  . aspirin 81 MG tablet Take 1 tablet (81 mg total) by mouth daily. (Patient taking differently: Take 81 mg by mouth at bedtime. )  . atenolol (TENORMIN) 50 MG tablet Take 1 tablet (50 mg total) by mouth daily.  [name] atorvastatin (LIPITOR) 40 MG tablet Take 1 tablet (40 mg total) by mouth daily.  . benazepril (LOTENSIN) 40 MG tablet Take 1 tablet (40 mg total) by mouth daily.  [name] CALCIUM PO Take by mouth.  . clidinium-chlordiazePOXIDE (LIBRAX) 5-2.5 MG capsule TAKE 1 CAPSULE BY MOUTH TWICE DAILY FOR  LUNCH  AND  DINNER  . clonazePAM (KLONOPIN) 0.5 MG tablet Take 1 tablet (0.5 mg total) by mouth daily as needed for anxiety.  . diclofenac sodium (VOLTAREN) 1 % GEL Apply 2 g topically 4 (four) times daily.  . diphenhydrAMINE (BENADRYL) 25 mg capsule Take 25 mg by mouth at bedtime.   . fluticasone (FLONASE) 50 MCG/ACT nasal spray USE 2 SPRAYS IN EACH  NOSTRIL DAILY  . hydrALAZINE (APRESOLINE) 25 MG tablet TAKE 1 TABLET BY MOUTH TWO  TIMES DAILY  . letrozole (FEMARA) 2.5 MG tablet Take 1 tablet (2.5 mg total) by mouth daily.  . niacin 500 MG tablet Take 1,000 mg by mouth at bedtime.  [name] nystatin ointment (MYCOSTATIN) Apply 1 application topically 2 (two) times daily.  . Olopatadine HCl 0.2 % SOLN Place 1 drop into the left eye daily.  . pantoprazole (PROTONIX) 40 MG tablet TAKE 1 TABLET BY MOUTH  DAILY  . Probiotic Product (PROBIOTIC-10 PO) Take by mouth daily.  . sertraline (ZOLOFT) 50 MG tablet Take 1 tablet (50 mg total) by mouth daily.  [name]  tiZANidine (ZANAFLEX) 4 MG tablet TAKE 1 TABLET(4 MG) BY MOUTH EVERY 6 HOURS AS NEEDED FOR MUSCLE SPASMS  . Turmeric 500 MG TABS Take 500 mg by mouth daily.   No facility-administered encounter medications on file as of 01/01/2019.     Activities of Daily Living In your present state of health, do you have any difficulty performing the following activities: 01/01/2019 04/15/2018  Hearing? N N  Vision? Y N  Comment cataracts  -  Difficulty concentrating or making decisions? N N  Walking or climbing stairs? N N  Dressing or bathing? N N  Doing errands, shopping? N N  Preparing Food and eating ? N -  Using the Toilet? N -  In the past six months, have you accidently leaked urine? N -  Comment only when sneezing or coughing  -  Do you have problems with loss of bowel control? N -  Managing your Medications? N -  Managing your Finances? N -  Housekeeping or managing your Housekeeping? N -  Some recent data might be hidden    Patient Care Team: Valerie Roys, DO as PCP - General (Family Medicine) Jannet Mantis, MD (Dermatology) Dagoberto Ligas, MD as Referring Physician (Internal Medicine) Bary Castilla Forest Gleason, MD (General Surgery) Noreene Filbert, MD as Referring Physician (Radiation Oncology)    Assessment:   This is a routine wellness examination for [place].  Exercise Activities and Dietary recommendations Current Exercise Habits: The patient does not participate in regular exercise at present, Exercise limited by: None identified  Goals    . DIET - INCREASE WATER INTAKE     Recommend drinking at  least 6-8 glasses of water a day        Fall Risk: Fall Risk  01/01/2019 10/10/2018 07/04/2018 12/24/2017 08/29/2017  Falls in the past year? 0 0 0 No No  Number falls in past yr: - 0 - - -  Injury with Fall? - - - - -  Risk for fall due to : - - - - -  Follow up - Falls evaluation completed - - -    FALL RISK PREVENTION PERTAINING TO THE HOME:  Any stairs in or around the  home? Yes  basement  If so, are there any without handrails? No   Home free of loose throw rugs in walkways, pet beds, electrical cords, etc? Yes  Adequate lighting in your home to reduce risk of falls? Yes   ASSISTIVE DEVICES UTILIZED TO PREVENT FALLS:  Life alert? No  Use of a cane, walker or w/c? No  Grab bars in the bathroom? No  Shower chair or bench in shower? No  Elevated toilet seat or a handicapped toilet? No   DME ORDERS:  DME order needed?  No   TIMED UP AND GO:  Unable to perform    Depression Screen PHQ 2/9 Scores 01/01/2019 05/24/2018 12/24/2017 08/29/2017  PHQ - 2 Score 0 1 0 0  PHQ- 9 Score - 1 - -  Exception Documentation - - - -     Cognitive Function     6CIT Screen 01/01/2019 12/24/2017 12/21/2016  What Year? 0 points 0 points 0 points  What month? 0 points 0 points 0 points  What time? 0 points 0 points 0 points  Count back from 20 0 points 0 points 0 points  Months in reverse 0 points 0 points 2 points  Repeat phrase 0 points 0 points 4 points  Total Score 0 0 6    Immunization History  Administered Date(s) Administered  . Influenza, High Dose Seasonal PF 06/21/2016, 05/24/2018  . Influenza,inj,Quad PF,6+ Mos 06/22/2015  . Influenza-Unspecified 06/02/2014, 05/07/2017  . Pneumococcal Conjugate-13 09/01/2014  . Pneumococcal Polysaccharide-23 12/11/2014  . Td 04/10/2005, 12/21/2015  . Tdap 04/30/2017  . Zoster 08/11/2008    Qualifies for Shingles Vaccine? Yes  Zostavax completed 08/11/2008. Due for Shingrix. Education has been provided regarding the importance of this vaccine. Pt has been advised to call insurance company to determine out of pocket expense. Advised may also receive vaccine at local pharmacy or Health Dept. Verbalized acceptance and understanding.  Tdap: up to date   Flu Vaccine: up to date   Pneumococcal Vaccine: up to date    Screening Tests Health Maintenance  Topic Date Due  . Fecal DNA (Cologuard)  02/02/2019  .  INFLUENZA VACCINE  03/29/2019  . MAMMOGRAM  03/19/2020  . TETANUS/TDAP  05/01/2027  . DEXA SCAN  Completed  . PNA vac Low Risk Adult  Completed  . Hepatitis C Screening  Addressed    Cancer Screenings:  Colorectal Screening: wants to discuss colonoscopy with Dr.Johnson.   Mammogram: Completed 03/19/2018, will complete in July (hx of breast cancer )  Bone Density: Completed 06/11/2018  Lung Cancer Screening: (Low Dose CT Chest recommended if Age 59-80 years, 30 pack-year currently smoking OR have quit w/in 15years.) doesnt qualify.   completed twice with no changes    Additional Screening:  Hepatitis C Screening: does not qualify; Completed 12/11/2014  Vision Screening: Recommended annual ophthalmology exams for early detection of glaucoma and other disorders of the eye. Is the patient up to date with  their annual eye exam?  Yes  Who is the provider or what is the name of the office in which the pt attends annual eye exams? Dr.Cheek   Dental Screening: Recommended annual dental exams for proper oral hygiene  Community Resource Referral:  CRR required this visit?  No       Plan:  I have personally reviewed and addressed the Medicare Annual Wellness questionnaire and have noted the following in the patient's chart:  A. Medical and social history B. Use of alcohol, tobacco or illicit drugs  C. Current medications and supplements D. Functional ability and status E.  Nutritional status F.  Physical activity G. Advance directives H. List of other physicians I.  Hospitalizations, surgeries, and ER visits in previous 12 months J.  [place] such as hearing and vision if needed, cognitive and depression L. Referrals and appointments   In addition, I have reviewed and discussed with patient certain preventive protocols, quality metrics, and best practice recommendations. A written personalized care plan for preventive services as well as general preventive health  recommendations were provided to patient. Nurse Health Advisor  Signed,    Crawford, Caro Hight, [place]  10/27/4197 Nurse Health Advisor   Nurse Notes: none

## 2019-01-01 NOTE — Patient Instructions (Signed)
Marie Parker , Thank you for taking time to come for your Medicare Wellness Visit. I appreciate your ongoing commitment to your health goals. Please review the following plan we discussed and let me know if I can assist you in the future.   Screening recommendations/referrals: Colonoscopy: due for colonoscopy, will discuss with Dr.Johnson Mammogram: due 02/2019 Bone Density: completed 05/2018 Recommended yearly ophthalmology/optometry visit for glaucoma screening and checkup Recommended yearly dental visit for hygiene and checkup  Vaccinations: Influenza vaccine: up to date Pneumococcal vaccine: up to date Tdap vaccine: up to date Shingles vaccine: not indicated     Advanced directives: Please bring a copy of your health care power of attorney and living will to the office at your convenience.  Conditions/risks identified: please let us know if you need anything! Stay Safe!  Next appointment: Follow up in one year for your annual wellness exam.    Preventive Care 65 Years and Older, Female Preventive care refers to lifestyle choices and visits with your health care provider that can promote health and wellness. What does preventive care include?  A yearly physical exam. This is also called an annual well check.  Dental exams once or twice a year.  Routine eye exams. Ask your health care provider how often you should have your eyes checked.  Personal lifestyle choices, including:  Daily care of your teeth and gums.  Regular physical activity.  Eating a healthy diet.  Avoiding tobacco and drug use.  Limiting alcohol use.  Practicing safe sex.  Taking low-dose aspirin every day.  Taking vitamin and mineral supplements as recommended by your health care provider. What happens during an annual well check? The services and screenings done by your health care provider during your annual well check will depend on your age, overall health, lifestyle risk factors, and family  history of disease. Counseling  Your health care provider may ask you questions about your:  Alcohol use.  Tobacco use.  Drug use.  Emotional well-being.  Home and relationship well-being.  Sexual activity.  Eating habits.  History of falls.  Memory and ability to understand (cognition).  Work and work Statistician.  Reproductive health. Screening  You may have the following tests or measurements:  Height, weight, and BMI.  Blood pressure.  Lipid and cholesterol levels. These may be checked every 5 years, or more frequently if you are over 39 years old.  Skin check.  Lung cancer screening. You may have this screening every year starting at age 66 if you have a 30-pack-year history of smoking and currently smoke or have quit within the past 15 years.  Fecal occult blood test (FOBT) of the stool. You may have this test every year starting at age 91.  Flexible sigmoidoscopy or colonoscopy. You may have a sigmoidoscopy every 5 years or a colonoscopy every 10 years starting at age 33.  Hepatitis C blood test.  Hepatitis B blood test.  Sexually transmitted disease (STD) testing.  Diabetes screening. This is done by checking your blood sugar (glucose) after you have not eaten for a while (fasting). You may have this done every 1-3 years.  Bone density scan. This is done to screen for osteoporosis. You may have this done starting at age 76.  Mammogram. This may be done every 1-2 years. Talk to your health care provider about how often you should have regular mammograms. Talk with your health care provider about your test results, treatment options, and if necessary, the need for more tests. Vaccines  Your health care provider may recommend certain vaccines, such as:  Influenza vaccine. This is recommended every year.  Tetanus, diphtheria, and acellular pertussis (Tdap, Td) vaccine. You may need a Td booster every 10 years.  Zoster vaccine. You may need this after  age 66.  Pneumococcal 13-valent conjugate (PCV13) vaccine. One dose is recommended after age 30.  Pneumococcal polysaccharide (PPSV23) vaccine. One dose is recommended after age 72. Talk to your health care provider about which screenings and vaccines you need and how often you need them. This information is not intended to replace advice given to you by your health care provider. Make sure you discuss any questions you have with your health care provider. Document Released: 09/10/2015 Document Revised: 05/03/2016 Document Reviewed: 06/15/2015 Elsevier Interactive Patient Education  2017 [employer] Prevention in the Home Falls can cause injuries. They can happen to people of all ages. There are many things you can do to make your home safe and to help prevent falls. What can I do on the outside of my home?  Regularly fix the edges of walkways and driveways and fix any cracks.  Remove anything that might make you trip as you walk through a door, such as a raised step or threshold.  Trim any bushes or trees on the path to your home.  Use bright outdoor lighting.  Clear any walking paths of anything that might make someone trip, such as rocks or tools.  Regularly check to see if handrails are loose or broken. Make sure that both sides of any steps have handrails.  Any raised decks and porches should have guardrails on the edges.  Have any leaves, snow, or ice cleared regularly.  Use sand or salt on walking paths during winter.  Clean up any spills in your garage right away. This includes oil or grease spills. What can I do in the bathroom?  Use night lights.  Install grab bars by the toilet and in the tub and shower. Do not use towel bars as grab bars.  Use non-skid mats or decals in the tub or shower.  If you need to sit down in the shower, use a plastic, non-slip stool.  Keep the floor dry. Clean up any water that spills on the floor as soon as it happens.   Remove soap buildup in the tub or shower regularly.  Attach bath mats securely with double-sided non-slip rug tape.  Do not have throw rugs and other things on the floor that can make you trip. What can I do in the bedroom?  Use night lights.  Make sure that you have a light by your bed that is easy to reach.  Do not use any sheets or blankets that are too big for your bed. They should not hang down onto the floor.  Have a firm chair that has side arms. You can use this for support while you get dressed.  Do not have throw rugs and other things on the floor that can make you trip. What can I do in the kitchen?  Clean up any spills right away.  Avoid walking on wet floors.  Keep items that you use a lot in easy-to-reach places.  If you need to reach something above you, use a strong step stool that has a grab bar.  Keep electrical cords out of the way.  Do not use floor polish or wax that makes floors slippery. If you must use wax, use non-skid floor wax.  Do  not have throw rugs and other things on the floor that can make you trip. What can I do with my stairs?  Do not leave any items on the stairs.  Make sure that there are handrails on both sides of the stairs and use them. Fix handrails that are broken or loose. Make sure that handrails are as long as the stairways.  Check any carpeting to make sure that it is firmly attached to the stairs. Fix any carpet that is loose or worn.  Avoid having throw rugs at the top or bottom of the stairs. If you do have throw rugs, attach them to the floor with carpet tape.  Make sure that you have a light switch at the top of the stairs and the bottom of the stairs. If you do not have them, ask someone to add them for you. What else can I do to help prevent falls?  Wear shoes that:  Do not have high heels.  Have rubber bottoms.  Are comfortable and fit you well.  Are closed at the toe. Do not wear sandals.  If you use a  stepladder:  Make sure that it is fully opened. Do not climb a closed stepladder.  Make sure that both sides of the stepladder are locked into place.  Ask someone to hold it for you, if possible.  Clearly mark and make sure that you can see:  Any grab bars or handrails.  First and last steps.  Where the edge of each step is.  Use tools that help you move around (mobility aids) if they are needed. These include:  Canes.  Walkers.  Scooters.  Crutches.  Turn on the lights when you go into a dark area. Replace any light bulbs as soon as they burn out.  Set up your furniture so you have a clear path. Avoid moving your furniture around.  If any of your floors are uneven, fix them.  If there are any pets around you, be aware of where they are.  Review your medicines with your doctor. Some medicines can make you feel dizzy. This can increase your chance of falling. Ask your doctor what other things that you can do to help prevent falls. This information is not intended to replace advice given to you by your health care provider. Make sure you discuss any questions you have with your health care provider. Document Released: 06/10/2009 Document Revised: 01/20/2016 Document Reviewed: 09/18/2014 Elsevier Interactive Patient Education  2017 [employer].

## 2019-01-02 ENCOUNTER — Ambulatory Visit (INDEPENDENT_AMBULATORY_CARE_PROVIDER_SITE_OTHER): Payer: Medicare Other | Admitting: Family Medicine

## 2019-01-02 ENCOUNTER — Encounter: Payer: Self-pay | Admitting: Family Medicine

## 2019-01-02 VITALS — BP 123/68 | HR 65 | Temp 98.4°F | Ht 59.5 in | Wt 135.0 lb

## 2019-01-02 DIAGNOSIS — I129 Hypertensive chronic kidney disease with stage 1 through stage 4 chronic kidney disease, or unspecified chronic kidney disease: Secondary | ICD-10-CM

## 2019-01-02 DIAGNOSIS — C50812 Malignant neoplasm of overlapping sites of left female breast: Secondary | ICD-10-CM | POA: Diagnosis not present

## 2019-01-02 DIAGNOSIS — E782 Mixed hyperlipidemia: Secondary | ICD-10-CM | POA: Diagnosis not present

## 2019-01-02 DIAGNOSIS — D692 Other nonthrombocytopenic purpura: Secondary | ICD-10-CM

## 2019-01-02 DIAGNOSIS — M1A9XX0 Chronic gout, unspecified, without tophus (tophi): Secondary | ICD-10-CM

## 2019-01-02 DIAGNOSIS — M7701 Medial epicondylitis, right elbow: Secondary | ICD-10-CM

## 2019-01-02 DIAGNOSIS — F419 Anxiety disorder, unspecified: Secondary | ICD-10-CM

## 2019-01-02 DIAGNOSIS — I739 Peripheral vascular disease, unspecified: Secondary | ICD-10-CM

## 2019-01-02 MED ORDER — PANTOPRAZOLE SODIUM 40 MG PO TBEC: 40.0000 mg | DELAYED_RELEASE_TABLET | Freq: Every day | ORAL | 2 refills | Status: DC

## 2019-01-02 MED ORDER — HYDRALAZINE HCL 25 MG PO TABS: 25.0000 mg | ORAL_TABLET | Freq: Two times a day (BID) | ORAL | 2 refills | Status: DC

## 2019-01-02 MED ORDER — CLONAZEPAM 0.5 MG PO TABS: 0.5000 mg | ORAL_TABLET | Freq: Every day | ORAL | 0 refills | Status: DC | PRN

## 2019-01-02 MED ORDER — AMLODIPINE BESYLATE 10 MG PO TABS: 10.0000 mg | ORAL_TABLET | Freq: Every day | ORAL | 2 refills | Status: DC

## 2019-01-02 MED ORDER — BENAZEPRIL HCL 40 MG PO TABS: 40.0000 mg | ORAL_TABLET | Freq: Every day | ORAL | 1 refills | Status: DC

## 2019-01-02 MED ORDER — SERTRALINE HCL 50 MG PO TABS: 50.0000 mg | ORAL_TABLET | Freq: Every day | ORAL | 1 refills | Status: DC

## 2019-01-02 MED ORDER — CILIDINIUM-CHLORDIAZEPOXIDE 2.5-5 MG PO CAPS: ORAL_CAPSULE | ORAL | 0 refills | Status: DC

## 2019-01-02 MED ORDER — ATENOLOL 50 MG PO TABS: 50.0000 mg | ORAL_TABLET | Freq: Every day | ORAL | 1 refills | Status: DC

## 2019-01-02 MED ORDER — ATORVASTATIN CALCIUM 40 MG PO TABS: 40.0000 mg | ORAL_TABLET | Freq: Every day | ORAL | 1 refills | Status: DC

## 2019-01-02 NOTE — Assessment & Plan Note (Signed)
No flares. Will check labs. Continue to monitor. Call with any concerns.

## 2019-01-02 NOTE — Assessment & Plan Note (Signed)
Under good control on current regimen. Continue current regimen. Continue to monitor. Call with any concerns. Refills given. Will get labs done ASAP.

## 2019-01-02 NOTE — Assessment & Plan Note (Signed)
In exacerbation due to the illness of her husband. Stable on her her current regimen. Continue current regimen. Continue to monitor. Call with any concerns.

## 2019-01-02 NOTE — Assessment & Plan Note (Signed)
Will keep BP and cholesterol under good control. Continue to monitor. Call with any concerns.  

## 2019-01-02 NOTE — Assessment & Plan Note (Signed)
Continues to follow with Dr. Bary Castilla. Call with any concerns. Continue to monitor.

## 2019-01-02 NOTE — Assessment & Plan Note (Signed)
Reassured patient. Continue to monitor. Call with any concerns.  

## 2019-01-02 NOTE — Patient Instructions (Signed)
[facility]  Ask your health care provider which exercises are safe for you. Do exercises exactly as told by your health care provider and adjust them as directed. It is normal to feel mild stretching, pulling, tightness, or discomfort as you do these exercises, but you should stop right away if you feel sudden pain or your pain gets worse. Do not begin these exercises until told by your health care provider.  Stretching and range of motion exercises  These exercises warm up your muscles and joints and improve the movement and flexibility of your elbow.  Exercise A: Wrist flexors    1. Straighten your left / right elbow in front of you with your palm up. If told by your health care provider, do this stretch with your elbow bent rather than straight.  2. With your other hand, gently pull your left / right hand and fingers toward you until you feel a gentle stretch on the top of your forearm.  3. Hold this position for __________ seconds.  Repeat __________ times. Complete this exercise __________ times a day.  Strengthening exercises  These exercises build strength and endurance in your elbow. Endurance is the ability to use your muscles for a long time, even after several repetitions.  Exercise B: Wrist flexion    1. Sit with your left / right forearm palm-up and supported on a table or other surface.  2. Let your left / right wrist extend over the edge of the surface.  3. Hold a __________ weight or a piece of rubber exercise band or tubing. Slowly curl your hand up toward your forearm. Try to only move your hand and keep the rest of your arm still.  4. Hold this position for __________ seconds.  5. Slowly return to the starting position.  Repeat __________ times. Complete this exercise __________ times a day.  Exercise C: Eccentric wrist flexion  1. Sit with your left / right forearm palm-up and supported on a table or other surface.  2. Let your left / right wrist extend over the edge of the  surface.  3. Hold a __________ weight or a piece of rubber exercise band or tubing.  4. Use your other hand to move your left / right hand up toward your forearm.  5. Slowly return to the starting position using only your left / right hand.  Repeat __________ times. Complete this exercise __________ times a day.  Exercise D: Hand turns, pronation i  1. Sit with your left / right forearm supported on a table or other surface.  2. Move your wrist so your pinkie travels toward your forearm and your thumb moves away from your forearm.  3. Hold this position for __________ seconds.  4. Slowly return to the starting position.  Exercise E: Hand turns, pronation ii    1. Sit with your left / right forearm supported on a table or other surface.  2. Hold a hammer in your left / right hand.  ? The exercise will be easier if you hold on near the head of the hammer.  ? If you hold on toward the end of the handle, the exercise will be harder.  3. Rest your left / right hand over the edge of the table with your palm facing up.  4. Without moving your elbow, slowly turn your palm and your hand down toward the table.  5. Hold this position for __________ seconds.  6. Slowly return to the starting position.    Repeat __________ times. Complete this exercise __________ times a day.  Exercise F: Shoulder blade squeeze  1. Sit in a stable chair with good posture. Do not let your back touch the back of the chair.  2. Your arms should be at your sides with your elbows bent. You can rest your forearms on a pillow.  3. Squeeze your shoulder blades together. Keep your shoulders level. Do not lift your shoulders up toward your ears.  4. Hold this position for __________ seconds.  5. Slowly release.  Repeat __________ times. Complete this exercise __________ times a day.  This information is not intended to replace advice given to you by your health care provider. Make sure you discuss any questions you have with your health care  provider.  Document Released: 08/14/2005 Document Revised: 04/20/2016 Document Reviewed: 04/26/2015  Elsevier Interactive Patient Education  2019 [employer]

## 2019-01-02 NOTE — Progress Notes (Signed)
BP 123/68   Pulse 65   Temp 98.4 F (36.9 C) (Oral)   Ht 4' 11.5" (1.511 m)   Wt 135 lb (61.2 kg)   SpO2 97%   BMI 26.81 kg/m    Subjective:    Patient ID: Marie Parker, female    DOB: Dec 31, 1945, 73 y.o.   MRN: [id number]  HPI: Marie Parker is a 73 y.o. female  Chief Complaint  Patient presents with  . Follow-up  . Gout    No current issues  . Anxiety  . Elbow Injury    Right elbow. Comes and goes.   . Hypertension  . Hyperlipidemia   HYPERTENSION / HYPERLIPIDEMIA Satisfied with current treatment? yes Duration of hypertension: chronic BP monitoring frequency: not checking BP medication side effects: no Past BP meds: amlodipine, atenolol, benazepril, hydralazine Duration of hyperlipidemia: chronic Cholesterol medication side effects: no Cholesterol supplements: none Past cholesterol medications: atorvastatin Medication compliance: excellent compliance Aspirin: yes Recent stressors: yes Recurrent headaches: no Visual changes: no Palpitations: no Dyspnea: no Chest pain: no Lower extremity edema: no Dizzy/lightheaded: no  ANXIETY/STRESS Duration:exacerbated Anxious mood: yes  Excessive worrying: yes Irritability: no  Sweating: no Nausea: no Palpitations:no Hyperventilation: no Panic attacks: no Agoraphobia: no  Obscessions/compulsions: no Depressed mood: yes Depression screen [facility] 2/9 01/01/2019 05/24/2018 12/24/2017 08/29/2017 06/26/2017  Decreased Interest 0 0 0 0 0  Down, Depressed, Hopeless 0 1 0 0 0  PHQ - 2 Score 0 1 0 0 0  Altered sleeping - 0 - - -  Tired, decreased energy - 0 - - -  Change in appetite - 0 - - -  Feeling bad or failure about yourself  - 0 - - -  Trouble concentrating - 0 - - -  Moving slowly or fidgety/restless - 0 - - -  Suicidal thoughts - 0 - - -  PHQ-9 Score - 1 - - -  Difficult doing work/chores - - - - -   Anhedonia: no Weight changes: no Insomnia: no   Hypersomnia: no Fatigue/loss of energy: yes Feelings  of worthlessness: no Feelings of guilt: no Impaired concentration/indecisiveness: no Suicidal ideations: no  Crying spells: no Recent Stressors/Life Changes: yes   Relationship problems: yes   Family stress: yes     Financial stress: no    Job stress: no    Recent death/loss: no  R ELBOW PAIN Duration: chronic Location: R medial elbow Mechanism of injury: unknown Onset: gradual Severity: moderate  Quality:  Sharp and aching Frequency: intermittent, especially with movement and lifting Radiation: no Aggravating factors:movement and lifting   Alleviating factors:tramadol   Status: fluctuating Treatments attempted: tramadol, rest, ice, heat, APAP, ibuprofen and aleve  Relief with NSAIDs?:  mild Swelling: no Redness: no  Warmth: no Trauma: no Chest pain: no  Shortness of breath: no  Fever: no Decreased sensation: no Paresthesias: no Weakness: no  No gout flares. Has been feeling well with her medicine. No concerns.    Relevant past medical, surgical, family and social history reviewed and updated as indicated. Interim medical history since our last visit reviewed. Allergies and medications reviewed and updated.  Review of Systems  Constitutional: Negative.   Respiratory: Negative.   Cardiovascular: Negative.   Musculoskeletal: Positive for arthralgias. Negative for back pain, gait problem, joint swelling, myalgias, neck pain and neck stiffness.  Skin: Negative.   Neurological: Negative.   Psychiatric/Behavioral: Negative.     Per HPI unless specifically indicated above     Objective:    BP  123/68   Pulse 65   Temp 98.4 F (36.9 C) (Oral)   Ht 4' 11.5" (1.511 m)   Wt 135 lb (61.2 kg)   SpO2 97%   BMI 26.81 kg/m   Wt Readings from Last 3 Encounters:  01/02/19 135 lb (61.2 kg)  01/01/19 135 lb 8 oz (61.5 kg)  10/10/18 139 lb (63 kg)    Physical Exam Vitals signs and nursing note reviewed.  Pulmonary:     Effort: Pulmonary effort is normal. No  respiratory distress.     Comments: Speaking in full sentences Neurological:     Mental Status: She is alert.  Psychiatric:        Mood and Affect: Mood normal.        Behavior: Behavior normal.        Thought Content: Thought content normal.        Judgment: Judgment normal.     Results for orders placed or performed in visit on 07/04/18  CBC with Differential/Platelet  Result Value Ref Range   WBC 4.2 3.4 - 10.8 x10E3/uL   RBC 4.09 3.77 - 5.28 x10E6/uL   Hemoglobin 12.0 11.1 - 15.9 g/dL   Hematocrit 36.8 34.0 - 46.6 %   MCV 90 79 - 97 fL   MCH 29.3 26.6 - 33.0 pg   MCHC 32.6 31.5 - 35.7 g/dL   RDW 13.2 12.3 - 15.4 %   Platelets 194 150 - 450 x10E3/uL   Neutrophils 63 Not Estab. %   Lymphs 24 Not Estab. %   Monocytes 9 Not Estab. %   Eos 4 Not Estab. %   Basos 0 Not Estab. %   Neutrophils Absolute 2.6 1.4 - 7.0 x10E3/uL   Lymphocytes Absolute 1.0 0.7 - 3.1 x10E3/uL   Monocytes Absolute 0.4 0.1 - 0.9 x10E3/uL   EOS (ABSOLUTE) 0.2 0.0 - 0.4 x10E3/uL   Basophils Absolute 0.0 0.0 - 0.2 x10E3/uL   Immature Granulocytes 0 Not Estab. %   Immature Grans (Abs) 0.0 0.0 - 0.1 x10E3/uL  Comprehensive metabolic panel  Result Value Ref Range   Glucose 91 65 - 99 mg/dL   BUN 13 8 - 27 mg/dL   Creatinine, Ser 0.86 0.57 - 1.00 mg/dL   GFR calc non Af Amer 68 >59 mL/min/1.73   GFR calc Af Amer 79 >59 mL/min/1.73   BUN/Creatinine Ratio 15 12 - 28   Sodium 142 134 - 144 mmol/L   Potassium 3.9 3.5 - 5.2 mmol/L   Chloride 100 96 - 106 mmol/L   CO2 24 20 - 29 mmol/L   Calcium 9.2 8.7 - 10.3 mg/dL   Total Protein 6.6 6.0 - 8.5 g/dL   Albumin 4.4 3.5 - 4.8 g/dL   Globulin, Total 2.2 1.5 - 4.5 g/dL   Albumin/Globulin Ratio 2.0 1.2 - 2.2   Bilirubin Total 0.3 0.0 - 1.2 mg/dL   Alkaline Phosphatase 84 39 - 117 IU/L   AST 26 0 - 40 IU/L   ALT 17 0 - 32 IU/L  Lipid Panel w/o Chol/HDL Ratio  Result Value Ref Range   Cholesterol, Total 125 100 - 199 mg/dL   Triglycerides 77 0 - 149 mg/dL    HDL 55 >39 mg/dL   VLDL Cholesterol Cal 15 5 - 40 mg/dL   LDL Calculated 55 0 - 99 mg/dL  Uric acid  Result Value Ref Range   Uric Acid 6.2 2.5 - 7.1 mg/dL      Assessment & Plan:   Problem List  Items Addressed This Visit      Cardiovascular and Mediastinum   Peripheral vascular disease ([place])    Will keep BP and cholesterol under good control. Continue to monitor. Call with any concerns.       Relevant Medications   amLODipine (NORVASC) 10 MG tablet   atenolol (TENORMIN) 50 MG tablet   atorvastatin (LIPITOR) 40 MG tablet   benazepril (LOTENSIN) 40 MG tablet   hydrALAZINE (APRESOLINE) 25 MG tablet   Senile purpura ([facility])    Reassured patient. Continue to monitor. Call with any concerns.       Relevant Medications   amLODipine (NORVASC) 10 MG tablet   atenolol (TENORMIN) 50 MG tablet   atorvastatin (LIPITOR) 40 MG tablet   benazepril (LOTENSIN) 40 MG tablet   hydrALAZINE (APRESOLINE) 25 MG tablet     Genitourinary   Benign hypertensive renal disease - Primary    Under good control on current regimen. Continue current regimen. Continue to monitor. Call with any concerns. Refills given. Will get labs done ASAP.        Relevant Orders   Comprehensive metabolic panel   Microalbumin, Urine Waived     Other   Hyperlipidemia    Under good control on current regimen. Continue current regimen. Continue to monitor. Call with any concerns. Refills given. Will get labs done ASAP.       Relevant Medications   amLODipine (NORVASC) 10 MG tablet   atenolol (TENORMIN) 50 MG tablet   atorvastatin (LIPITOR) 40 MG tablet   benazepril (LOTENSIN) 40 MG tablet   hydrALAZINE (APRESOLINE) 25 MG tablet   Other Relevant Orders   Comprehensive metabolic panel   Lipid Panel w/o Chol/HDL Ratio   Gout    No flares. Will check labs. Continue to monitor. Call with any concerns.       Relevant Orders   Comprehensive metabolic panel   Uric acid   Acute anxiety    In exacerbation due to  the illness of her husband. Stable on her her current regimen. Continue current regimen. Continue to monitor. Call with any concerns.       Relevant Medications   sertraline (ZOLOFT) 50 MG tablet   Other Relevant Orders   Comprehensive metabolic panel   Malignant neoplasm of overlapping sites of left female breast ([place])    Continues to follow with Dr. Bary Castilla. Call with any concerns. Continue to monitor.       Relevant Orders   Comprehensive metabolic panel    Other Visit Diagnoses    Medial epicondylitis of right elbow       Chronic and worsening. Will get her in for medial epicondyle injection ASAP.       Follow up plan: Return ASAP elbow injection, 3 months physical.    . This visit was completed via telephone due to the restrictions of the COVID-19 pandemic. All issues as above were discussed and addressed but no physical exam was performed. If it was felt that the patient should be evaluated in the office, they were directed there. The patient verbally consented to this visit. Patient was unable to complete an audio/visual visit due to Lack of equipment. Due to the catastrophic nature of the COVID-19 pandemic, this visit was done through audio contact only. . Location of the patient: home . Location of the provider: home . Those involved with this call:  . Provider: Park Liter, DO . CMA: Gerda Diss, CMA . Front Desk/Registration: Don Perking  . Time spent on call: 30  minutes on the phone discussing health concerns. 45 minutes total spent in review of patient's record and preparation of their chart.

## 2019-01-13 ENCOUNTER — Other Ambulatory Visit: Payer: Self-pay

## 2019-01-13 ENCOUNTER — Ambulatory Visit (INDEPENDENT_AMBULATORY_CARE_PROVIDER_SITE_OTHER): Payer: Medicare Other | Admitting: Family Medicine

## 2019-01-13 ENCOUNTER — Encounter: Payer: Self-pay | Admitting: Family Medicine

## 2019-01-13 VITALS — BP 131/77 | HR 60 | Temp 98.1°F | Ht 59.0 in | Wt 138.0 lb

## 2019-01-13 DIAGNOSIS — I129 Hypertensive chronic kidney disease with stage 1 through stage 4 chronic kidney disease, or unspecified chronic kidney disease: Secondary | ICD-10-CM

## 2019-01-13 DIAGNOSIS — C50812 Malignant neoplasm of overlapping sites of left female breast: Secondary | ICD-10-CM

## 2019-01-13 DIAGNOSIS — M1A9XX0 Chronic gout, unspecified, without tophus (tophi): Secondary | ICD-10-CM

## 2019-01-13 DIAGNOSIS — E782 Mixed hyperlipidemia: Secondary | ICD-10-CM | POA: Diagnosis not present

## 2019-01-13 DIAGNOSIS — M7701 Medial epicondylitis, right elbow: Secondary | ICD-10-CM

## 2019-01-13 DIAGNOSIS — F419 Anxiety disorder, unspecified: Secondary | ICD-10-CM

## 2019-01-13 LAB — MICROALBUMIN, URINE WAIVED
Creatinine, Urine Waived: 10 mg/dL (ref 10–300)
Microalb, Ur Waived: 10 mg/L (ref 0–19)
Microalb/Creat Ratio: <30 mg/g (ref <30)

## 2019-01-13 NOTE — Progress Notes (Signed)
BP 131/77   Pulse 60   Temp 98.1 F (36.7 C) (Oral)   Ht 4\' 11"  (1.499 m)   Wt 138 lb (62.6 kg)   SpO2 99%   BMI 27.87 kg/m    Subjective:    Patient ID: Marie Parker, female    DOB: 1946/02/22, 73 y.o.   MRN: [id number]  HPI: Marie Parker is a 73 y.o. female  Chief Complaint  Patient presents with  . Elbow injection    right elbow   ELBOW PAIN Duration: chronic Location: R medial elbow Mechanism of injury: unknown Onset: gradual Severity: moderate  Quality:  Sharp and aching Frequency:  intermittent, especially with movement and lifting Radiation: no Aggravating factors: movement and lifting   Alleviating factors: tramadol   Status: fluctuating Treatments attempted: tramadol, rest, ice, heat, APAP, ibuprofen and aleve  Relief with NSAIDs?:  mild Swelling: no Redness: no  Warmth: no Trauma: no Chest pain: no  Shortness of breath: no  Fever: no Decreased sensation: no Paresthesias: no Weakness: no  Relevant past medical, surgical, family and social history reviewed and updated as indicated. Interim medical history since our last visit reviewed. Allergies and medications reviewed and updated.  Review of Systems  Constitutional: Negative.   Respiratory: Negative.   Cardiovascular: Negative.   Musculoskeletal: Positive for arthralgias and myalgias. Negative for back pain, gait problem, joint swelling, neck pain and neck stiffness.  Skin: Negative.   Neurological: Negative.   Psychiatric/Behavioral: Negative.     Per HPI unless specifically indicated above     Objective:    BP 131/77   Pulse 60   Temp 98.1 F (36.7 C) (Oral)   Ht 4\' 11"  (1.499 m)   Wt 138 lb (62.6 kg)   SpO2 99%   BMI 27.87 kg/m   Wt Readings from Last 3 Encounters:  01/13/19 138 lb (62.6 kg)  01/02/19 135 lb (61.2 kg)  01/01/19 135 lb 8 oz (61.5 kg)    Physical Exam Vitals signs and nursing note reviewed.  Constitutional:      General: She is not in acute  distress.    Appearance: Normal appearance. She is not ill-appearing, toxic-appearing or diaphoretic.  HENT:     Head: Normocephalic and atraumatic.     Right Ear: External ear normal.     Left Ear: External ear normal.     Nose: Nose normal.     Mouth/Throat:     Mouth: Mucous membranes are moist.     Pharynx: Oropharynx is clear.  Eyes:     General: No scleral icterus.       Right eye: No discharge.        Left eye: No discharge.     Extraocular Movements: Extraocular movements intact.     Conjunctiva/sclera: Conjunctivae normal.     Pupils: Pupils are equal, round, and reactive to light.  Neck:     Musculoskeletal: Normal range of motion and neck supple.  Cardiovascular:     Rate and Rhythm: Normal rate and regular rhythm.     Pulses: Normal pulses.     Heart sounds: Normal heart sounds. No murmur. No friction rub. No gallop.   Pulmonary:     Effort: Pulmonary effort is normal. No respiratory distress.     Breath sounds: Normal breath sounds. No stridor. No wheezing, rhonchi or rales.  Chest:     Chest wall: No tenderness.  Musculoskeletal: Normal range of motion.        General: Tenderness (medial epicondyle  on R) present.  Skin:    General: Skin is warm and dry.     Capillary Refill: Capillary refill takes less than 2 seconds.     Coloration: Skin is not jaundiced or pale.     Findings: No bruising, erythema, lesion or rash.  Neurological:     General: No focal deficit present.     Mental Status: She is alert and oriented to person, place, and time. Mental status is at baseline.  Psychiatric:        Mood and Affect: Mood normal.        Behavior: Behavior normal.        Thought Content: Thought content normal.        Judgment: Judgment normal.     Results for orders placed or performed in visit on 07/04/18  CBC with Differential/Platelet  Result Value Ref Range   WBC 4.2 3.4 - 10.8 x10E3/uL   RBC 4.09 3.77 - 5.28 x10E6/uL   Hemoglobin 12.0 11.1 - 15.9 g/dL    Hematocrit 36.8 34.0 - 46.6 %   MCV 90 79 - 97 fL   MCH 29.3 26.6 - 33.0 pg   MCHC 32.6 31.5 - 35.7 g/dL   RDW 13.2 12.3 - 15.4 %   Platelets 194 150 - 450 x10E3/uL   Neutrophils 63 Not Estab. %   Lymphs 24 Not Estab. %   Monocytes 9 Not Estab. %   Eos 4 Not Estab. %   Basos 0 Not Estab. %   Neutrophils Absolute 2.6 1.4 - 7.0 x10E3/uL   Lymphocytes Absolute 1.0 0.7 - 3.1 x10E3/uL   Monocytes Absolute 0.4 0.1 - 0.9 x10E3/uL   EOS (ABSOLUTE) 0.2 0.0 - 0.4 x10E3/uL   Basophils Absolute 0.0 0.0 - 0.2 x10E3/uL   Immature Granulocytes 0 Not Estab. %   Immature Grans (Abs) 0.0 0.0 - 0.1 x10E3/uL  Comprehensive metabolic panel  Result Value Ref Range   Glucose 91 65 - 99 mg/dL   BUN 13 8 - 27 mg/dL   Creatinine, Ser 0.86 0.57 - 1.00 mg/dL   GFR calc non Af Amer 68 >59 mL/min/1.73   GFR calc Af Amer 79 >59 mL/min/1.73   BUN/Creatinine Ratio 15 12 - 28   Sodium 142 134 - 144 mmol/L   Potassium 3.9 3.5 - 5.2 mmol/L   Chloride 100 96 - 106 mmol/L   CO2 24 20 - 29 mmol/L   Calcium 9.2 8.7 - 10.3 mg/dL   Total Protein 6.6 6.0 - 8.5 g/dL   Albumin 4.4 3.5 - 4.8 g/dL   Globulin, Total 2.2 1.5 - 4.5 g/dL   Albumin/Globulin Ratio 2.0 1.2 - 2.2   Bilirubin Total 0.3 0.0 - 1.2 mg/dL   Alkaline Phosphatase 84 39 - 117 IU/L   AST 26 0 - 40 IU/L   ALT 17 0 - 32 IU/L  Lipid Panel w/o Chol/HDL Ratio  Result Value Ref Range   Cholesterol, Total 125 100 - 199 mg/dL   Triglycerides 77 0 - 149 mg/dL   HDL 55 >39 mg/dL   VLDL Cholesterol Cal 15 5 - 40 mg/dL   LDL Calculated 55 0 - 99 mg/dL  Uric acid  Result Value Ref Range   Uric Acid 6.2 2.5 - 7.1 mg/dL      Assessment & Plan:   Problem List Items Addressed This Visit      Genitourinary   Benign hypertensive renal disease     Other   Hyperlipidemia   Gout  Acute anxiety   Malignant neoplasm of overlapping sites of left female breast [facility])    Other Visit Diagnoses    Medial epicondylitis of right elbow    -  Primary   Has failed  conservative therapy. Injected today as below. Call with any concerns.        Follow up plan: Return in about 3 months (around 04/15/2019) for Physical.  Procedure: Right Medial Epicondylitis Steroid Injection        Diagnosis:   ICD-10-CM   1. Medial epicondylitis of right elbow M77.01    Has failed conservative therapy. Injected today as below. Call with any concerns.   2. Chronic gout without tophus, unspecified cause, unspecified site [id number].[id number] Uric acid    Comprehensive metabolic panel  3. Benign hypertensive renal disease I12.9 Microalbumin, Urine Waived    Comprehensive metabolic panel  4. Mixed hyperlipidemia E78.2 Lipid Panel w/o Chol/HDL Ratio    Comprehensive metabolic panel  5. Malignant neoplasm of overlapping sites of left female breast, unspecified estrogen receptor status ([place]) C50.812 Comprehensive metabolic panel  6. Acute anxiety F41.9 Comprehensive metabolic panel    Physician: MJ Consent:  Risks, benefits, and alternative treatments discussed and all questions were answered.  Patient elected to proceed and verbal consent obtained.  Description: Area prepped and draped using  semi-sterile technique.  After identifying area of maximal tenderness, A mixture of 3cc 1% lidocaine and 1cc Kenalog 40 was injected.  A bandage was then placed over the injection site. Complications: none Post Procedure Instructions: Wound care instructions discussed and patient was instructed to keep area clean and dry.  Signs and symptoms of infection discussed, patient agrees to contact the office ASAP should they occur.

## 2019-01-13 NOTE — Patient Instructions (Signed)
Golfer's Elbow Rehab  Ask your health care provider which exercises are safe for you. Do exercises exactly as told by your health care provider and adjust them as directed. It is normal to feel mild stretching, pulling, tightness, or discomfort as you do these exercises, but you should stop right away if you feel sudden pain or your pain gets worse. Do not begin these exercises until told by your health care provider.  Stretching and range of motion exercises  These exercises warm up your muscles and joints and improve the movement and flexibility of your elbow.  Exercise A: Wrist flexors    1. Straighten your left / right elbow in front of you with your palm up. If told by your health care provider, do this stretch with your elbow bent rather than straight.  2. With your other hand, gently pull your left / right hand and fingers toward you until you feel a gentle stretch on the top of your forearm.  3. Hold this position for __________ seconds.  Repeat __________ times. Complete this exercise __________ times a day.  Strengthening exercises  These exercises build strength and endurance in your elbow. Endurance is the ability to use your muscles for a long time, even after several repetitions.  Exercise B: Wrist flexion    1. Sit with your left / right forearm palm-up and supported on a table or other surface.  2. Let your left / right wrist extend over the edge of the surface.  3. Hold a __________ weight or a piece of rubber exercise band or tubing. Slowly curl your hand up toward your forearm. Try to only move your hand and keep the rest of your arm still.  4. Hold this position for __________ seconds.  5. Slowly return to the starting position.  Repeat __________ times. Complete this exercise __________ times a day.  Exercise C: Eccentric wrist flexion  1. Sit with your left / right forearm palm-up and supported on a table or other surface.  2. Let your left / right wrist extend over the edge of the  surface.  3. Hold a __________ weight or a piece of rubber exercise band or tubing.  4. Use your other hand to move your left / right hand up toward your forearm.  5. Slowly return to the starting position using only your left / right hand.  Repeat __________ times. Complete this exercise __________ times a day.  Exercise D: Hand turns, pronation i  1. Sit with your left / right forearm supported on a table or other surface.  2. Move your wrist so your pinkie travels toward your forearm and your thumb moves away from your forearm.  3. Hold this position for __________ seconds.  4. Slowly return to the starting position.  Exercise E: Hand turns, pronation ii    1. Sit with your left / right forearm supported on a table or other surface.  2. Hold a hammer in your left / right hand.  ? The exercise will be easier if you hold on near the head of the hammer.  ? If you hold on toward the end of the handle, the exercise will be harder.  3. Rest your left / right hand over the edge of the table with your palm facing up.  4. Without moving your elbow, slowly turn your palm and your hand down toward the table.  5. Hold this position for __________ seconds.  6. Slowly return to the starting position.    sides with your elbows bent. You can rest your forearms on a pillow. 3. Squeeze your shoulder blades together. Keep your shoulders level. Do not lift your shoulders up toward your ears. 4. Hold this position for __________ seconds. 5. Slowly release. Repeat __________ times. Complete this exercise __________ times a day. This information is not intended to replace advice given to you by your health care provider. Make sure you discuss any questions you have with your health care provider. Document  Released: 08/14/2005 Document Revised: 04/20/2016 Document Reviewed: 04/26/2015 Elsevier Interactive Patient Education  2019 [employer] [place] [employer]  Golfer's elbow, also called medial epicondylitis, is a condition that results from inflammation of the strong bands of tissue (tendons) that attach your forearm muscles to the inside of your bone at the elbow. These tendons affect the muscles that bend the palm toward the wrist (flexion). This condition is called golfer's elbow because it is more common among people who constantly bend and twist their wrists, such as golfers. This injury usually results from overuse. Tendons also become less flexible with age. This condition causes elbow pain that may spread to your forearm and upper arm. The pain may get worse when you bend your wrist downward. What are the causes? This condition is an overuse injury that is caused by:  Repeatedly flexing, turning, or twisting your wrist.  Constantly gripping objects with your hands. What increases the risk? This condition is more likely to develop in people who play golf or tennis or have jobs that require the constant use of their hands. This injury is more common among:  Carpenters.  Gardeners.  Musicians.  Bricklayers.  Typists. What are the signs or symptoms? Symptoms of this condition include:  Pain near the inner elbow or forearm.  Reduced grip strength. How is this diagnosed? This condition is diagnosed based on your symptoms, medical history, and physical exam. During the exam, your health care provider may test your grip strength and move your wrist to check for pain. You may also have an MRI to confirm the diagnosis, look for other issues, and check for tears in the ligaments, muscles, or tendons. How is this treated? Treatment for this condition includes:  Stopping all activities that make you bend or twist your wrist until your pain and other symptoms go away.  Icing your  wrist to relieve pain.  Taking NSAIDs or getting corticosteroid injections to reduce pain and swelling.  Doing stretches, range-of-motion, and strengthening exercises (physical therapy) as told by your health care provider. In rare cases, surgery may be needed if your condition does not improve. Follow these instructions at home:  If directed, apply ice to the injured area. ? Put ice in a plastic bag. ? Place a towel between your skin and the bag. ? Leave the ice on for 20 minutes, 2-3 times a day.  Move your fingers often to avoid stiffness.  Raise (elevate) the injured area above the level of your heart while you are sitting or lying down.  Return to your normal activities as told by your health care provider. Ask your health care provider what activities are safe for you.  Do exercises as told by your health care provider.  Do not use tobacco products, including cigarettes, chewing tobacco, or e-cigarettes. If you need help quitting, ask your health care provider.  Take over-the-counter and prescription medicines only as told by your health care provider.  Keep all follow-up visits as told by your health care provider. This  is important. How is this prevented?  Warm up and stretch before being active.  Cool down and stretch after being active.  Give your body time to rest between periods of activity.  Make sure to use equipment that fits you.  Be safe and responsible while being active to avoid falls.  Do at least 150 minutes of moderate-intensity exercise each week, such as brisk walking or water aerobics.  Maintain physical fitness, including: ? Strength. ? Flexibility. ? Cardiovascular fitness. ? Endurance.  Perform exercises to strengthen the forearm muscles.  Slow your golf swing to reduce shock in the arm when making contact with the ball, if you play golf. Contact a health care provider if:  Your pain does not improve or it gets worse.  You notice  numbness in your hand. Get help right away if:  Your pain is severe.  You cannot move your wrist. This information is not intended to replace advice given to you by your health care provider. Make sure you discuss any questions you have with your health care provider. Document Released: 08/14/2005 Document Revised: 05/03/2018 Document Reviewed: 04/26/2015 Elsevier Interactive Patient Education  2019 [employer].

## 2019-01-14 LAB — LIPID PANEL W/O CHOL/HDL RATIO
Cholesterol, Total: 121 mg/dL (ref 100–199)
HDL: 58 mg/dL (ref >39)
LDL Calculated: 48 mg/dL (ref 0–99)
Triglycerides: 73 mg/dL (ref 0–149)
VLDL Cholesterol Cal: 15 mg/dL (ref 5–40)

## 2019-01-14 LAB — COMPREHENSIVE METABOLIC PANEL
ALT: 21 IU/L (ref 0–32)
AST: 25 IU/L (ref 0–40)
Albumin/Globulin Ratio: 1.9 (ref 1.2–2.2)
Albumin: 4.6 g/dL (ref 3.7–4.7)
Alkaline Phosphatase: 70 IU/L (ref 39–117)
BUN/Creatinine Ratio: 24 (ref 12–28)
BUN: 19 mg/dL (ref 8–27)
Bilirubin Total: 0.2 mg/dL (ref 0.0–1.2)
CO2: 25 mmol/L (ref 20–29)
Calcium: 9.2 mg/dL (ref 8.7–10.3)
Chloride: 105 mmol/L (ref 96–106)
Creatinine, Ser: 0.79 mg/dL (ref 0.57–1.00)
GFR calc Af Amer: 87 mL/min/{1.73_m2} (ref >59)
GFR calc non Af Amer: 76 mL/min/{1.73_m2} (ref >59)
Globulin, Total: 2.4 g/dL (ref 1.5–4.5)
Glucose: 100 mg/dL — ABNORMAL HIGH (ref 65–99)
Potassium: 4.1 mmol/L (ref 3.5–5.2)
Sodium: 144 mmol/L (ref 134–144)
Total Protein: 7 g/dL (ref 6.0–8.5)

## 2019-01-14 LAB — URIC ACID: Uric Acid: 6.5 mg/dL (ref 2.5–7.1)

## 2019-01-16 ENCOUNTER — Telehealth: Payer: Self-pay | Admitting: Cardiovascular Disease

## 2019-01-16 DIAGNOSIS — L578 Other skin changes due to chronic exposure to nonionizing radiation: Secondary | ICD-10-CM | POA: Diagnosis not present

## 2019-01-16 DIAGNOSIS — Z859 Personal history of malignant neoplasm, unspecified: Secondary | ICD-10-CM | POA: Diagnosis not present

## 2019-01-16 DIAGNOSIS — Z872 Personal history of diseases of the skin and subcutaneous tissue: Secondary | ICD-10-CM | POA: Diagnosis not present

## 2019-01-16 NOTE — Telephone Encounter (Signed)

## 2019-01-17 ENCOUNTER — Encounter: Payer: Self-pay | Admitting: Cardiovascular Disease

## 2019-01-17 ENCOUNTER — Other Ambulatory Visit: Payer: Self-pay

## 2019-01-17 ENCOUNTER — Telehealth (INDEPENDENT_AMBULATORY_CARE_PROVIDER_SITE_OTHER): Payer: Medicare Other | Admitting: Cardiovascular Disease

## 2019-01-17 VITALS — BP 127/71 | HR 62 | Temp 98.7°F | Ht 59.5 in | Wt 133.4 lb

## 2019-01-17 DIAGNOSIS — I447 Left bundle-branch block, unspecified: Secondary | ICD-10-CM

## 2019-01-17 DIAGNOSIS — I739 Peripheral vascular disease, unspecified: Secondary | ICD-10-CM

## 2019-01-17 NOTE — Progress Notes (Signed)
Virtual Visit via Video Note   This visit type was conducted due to national recommendations for restrictions regarding the COVID-19 Pandemic (e.g. social distancing) in an effort to limit this patient's exposure and mitigate transmission in our community.  Due to her co-morbid illnesses, this patient is at least at moderate risk for complications without adequate follow up.  This format is felt to be most appropriate for this patient at this time.  All issues noted in this document were discussed and addressed.  A limited physical exam was performed with this format.  Please refer to the patient's chart for her consent to telehealth for [facility].   Date:  01/17/2019   ID:  Marie Parker, DOB 1945-09-13, MRN [id number]  Patient Location: Home Provider Location: Office  PCP:  Valerie Roys, DO  Cardiologist:  Kathlyn Sacramento, MD  Electrophysiologist:  None   Evaluation Performed:  Follow-Up Visit  Chief Complaint: No complaints today.  History of Present Illness:    Marie Parker is a 73 y.o. female who was seen via video visit for follow-up regarding bundle branch block, pericardial disease and previous atypical chest pain. She has history of GERD, prior tobacco use and breast cancer diagnosed last year. She has remote history of right iliac stenting. She had cardiac catheterization done in 2009 which showed 40% LAD stenosis. She was seen in 2018 for atypical chest pain.  She underwent a Lexiscan Myoview which showed no evidence of ischemia with normal ejection fraction. She was diagnosed last year with breast cancer and underwent mastectomy.  She did not require chemotherapy.  She has been doing extremely well with no chest pain, shortness of breath or palpitations.  History of syncope.    The patient does not have symptoms concerning for COVID-19 infection (fever, chills, cough, or new shortness of breath).    Past Medical History:  Diagnosis Date  . Anxiety   .  Arthritis   . Breast cancer of upper-outer quadrant of left female breast ([place]) 04/22/2018   16 mm, T1c, N0.  ER/PR positive, HER-2/neu not overexpressed.  Low MammaPrint score.    [name] CAD (coronary artery disease)   . Cancer (HCC)    squamous cell  . Cataract   . Cholecystitis 04/10/2018  . Chronic kidney disease   . Colon cancer screening 2017?   negative cologuard  . Depression   . Family history of adverse reaction to anesthesia    mother had N/V  . Generalized osteoarthritis   . GERD (gastroesophageal reflux disease)   . Gout   . History of endoscopy 11/2012   Gastritis  . Hyperlipidemia   . Hypertension   . IFG (impaired fasting glucose)   . Insomnia   . Menopause   . Osteoporosis   . Parathyroid adenoma   . Personal history of tobacco use, presenting hazards to health 02/02/2016  . PVD (peripheral vascular disease) ([place])   . PVD (peripheral vascular disease) ([place])   . Rotator cuff syndrome of shoulder and allied disorders   . Tobacco abuse    Past Surgical History:  Procedure Laterality Date  . BREAST BIOPSY Left 2014   neg core  . BREAST BIOPSY Left 04/05/2018  . BREAST EXCISIONAL BIOPSY Left 1970's   neg  . BREAST FIBROADENOMA SURGERY    . BREAST MAMMOSITE Left 04/2018  . CARDIAC CATHETERIZATION  09/17/2007   Insignificant CAD with 40-50% stenosis of mid LAD.   [name] ILIAC ARTERY STENT     right iliac artery  stent   . MASTECTOMY, PARTIAL Left 04/22/2018   Procedure: MASTECTOMY PARTIAL;  Surgeon: Robert Bellow, MD;  Location: [facility] ORS;  Service: General;  Laterality: Left;  . ORIF PATELLA Left 03/22/2016   Dr. Tristan Schroeder  . PARATHYROIDECTOMY  2012   [facility]  . SENTINEL NODE BIOPSY Left 04/22/2018   Procedure: SENTINEL NODE BIOPSY;  Surgeon: Robert Bellow, MD;  Location: [facility] ORS;  Service: General;  Laterality: Left;  . SKIN BIOPSY     precancerous     Current Meds  Medication Sig  . acetaminophen (TYLENOL) 650 MG CR tablet Take 1,950 mg by mouth daily as  needed for pain.  [name] amLODipine (NORVASC) 10 MG tablet Take 1 tablet (10 mg total) by mouth daily.  [name] aspirin 81 MG tablet Take 1 tablet (81 mg total) by mouth daily. (Patient taking differently: Take 81 mg by mouth at bedtime. )  . atenolol (TENORMIN) 50 MG tablet Take 1 tablet (50 mg total) by mouth daily.  [name] atorvastatin (LIPITOR) 40 MG tablet Take 1 tablet (40 mg total) by mouth daily.  . benazepril (LOTENSIN) 40 MG tablet Take 1 tablet (40 mg total) by mouth daily.  [name] CALCIUM PO Take by mouth.  . clidinium-chlordiazePOXIDE (LIBRAX) 5-2.5 MG capsule TAKE 1 CAPSULE BY MOUTH TWICE DAILY FOR  LUNCH  AND  DINNER  . clonazePAM (KLONOPIN) 0.5 MG tablet Take 1 tablet (0.5 mg total) by mouth daily as needed for anxiety.  . diclofenac sodium (VOLTAREN) 1 % GEL Apply 2 g topically 4 (four) times daily.  . diphenhydrAMINE (BENADRYL) 25 mg capsule Take 25 mg by mouth at bedtime.   . fluticasone (FLONASE) 50 MCG/ACT nasal spray USE 2 SPRAYS IN EACH  NOSTRIL DAILY  . hydrALAZINE (APRESOLINE) 25 MG tablet Take 1 tablet (25 mg total) by mouth 2 (two) times daily.  [name] letrozole (FEMARA) 2.5 MG tablet Take 1 tablet (2.5 mg total) by mouth daily.  . niacin 500 MG tablet Take 1,000 mg by mouth at bedtime.  [name] nystatin ointment (MYCOSTATIN) Apply 1 application topically 2 (two) times daily.  . Olopatadine HCl 0.2 % SOLN Place 1 drop into the left eye daily.  . pantoprazole (PROTONIX) 40 MG tablet Take 1 tablet (40 mg total) by mouth daily.  . Probiotic Product (PROBIOTIC-10 PO) Take by mouth daily.  . sertraline (ZOLOFT) 50 MG tablet Take 1 tablet (50 mg total) by mouth daily.  [name] tiZANidine (ZANAFLEX) 4 MG tablet TAKE 1 TABLET(4 MG) BY MOUTH EVERY 6 HOURS AS NEEDED FOR MUSCLE SPASMS  . Turmeric 500 MG TABS Take 500 mg by mouth daily.     Allergies:   Crestor [rosuvastatin]; Cyclobenzaprine; Erythromycin; and Fosamax [alendronate sodium]   Social History   Tobacco Use  . Smoking status: Former Smoker     Packs/day: 1.00    Years: 42.00    Pack years: 42.00    Types: Cigarettes    Last attempt to quit: 08/28/2000    Years since quitting: 18.4  . Smokeless tobacco: Never Used  Substance Use Topics  . Alcohol use: No  . Drug use: No     Family Hx: The patient's family history includes AAA (abdominal aortic aneurysm) in her sister; Alcohol abuse in her father; Bone cancer in her paternal uncle; Breast cancer in her paternal aunt; Breast cancer (age of onset: 66) in her maternal aunt; Dementia in her sister; Diabetes in her mother; Heart attack in her maternal grandfather; Heart attack (age of onset: 66)  in her father; Heart disease in her father; Heart disease (age of onset: 64) in her sister; Hypertension in her father and mother; Liver disease in her father; Stroke in her maternal grandmother, mother, paternal grandmother, and sister.  ROS:   Please see the history of present illness.     All other systems reviewed and are negative.   Prior CV studies:   The following studies were reviewed today:  Reviewed most recent nuclear stress test from 2018  Labs/Other Tests and Data Reviewed:    EKG:  An ECG dated August 2019 was personally reviewed today and demonstrated:  Normal sinus rhythm with left bundle branch block  Recent Labs: 07/04/2018: Hemoglobin 12.0; Platelets 194 01/13/2019: ALT 21; BUN 19; Creatinine, Ser 0.79; Potassium 4.1; Sodium 144   Recent Lipid Panel Lab Results  Component Value Date/Time   CHOL 121 01/13/2019 02:04 PM   CHOL 129 06/21/2016 08:39 AM   TRIG 73 01/13/2019 02:04 PM   TRIG 136 06/21/2016 08:39 AM   HDL 58 01/13/2019 02:04 PM   LDLCALC 48 01/13/2019 02:04 PM    Wt Readings from Last 3 Encounters:  01/17/19 133 lb 6 oz (60.5 kg)  01/13/19 138 lb (62.6 kg)  01/02/19 135 lb (61.2 kg)     Objective:    Vital Signs:  BP 127/71   Pulse 62   Temp 98.7 F (37.1 C)   Ht 4' 11.5" (1.511 m)   Wt 133 lb 6 oz (60.5 kg)   SpO2 95%   BMI 26.49 kg/m     VITAL SIGNS:  reviewed GEN:  no acute distress EYES:  sclerae anicteric, EOMI - Extraocular Movements Intact RESPIRATORY:  normal respiratory effort, symmetric expansion SKIN:  no rash, lesions or ulcers. MUSCULOSKELETAL:  no obvious deformities. NEURO:  alert and oriented x 3, no obvious focal deficit PSYCH:  normal affect  ASSESSMENT & PLAN:    1.    Left bundle branch block: Currently with no symptoms suggestive of arrhythmia.  2. Essential hypertension: Blood pressure is controlled on current medications  3. Peripheral arterial disease: Previous iliac stenting with no recurrent claudication. Continue aspirin.  4. Hyperlipidemia: Continue treatment with atorvastatin .  I reviewed recent labs which were unremarkable.  Lipid profile was excellent with a triglyceride of 73 and LDL of 48.   COVID-19 Education: The signs and symptoms of COVID-19 were discussed with the patient and how to seek care for testing (follow up with PCP or arrange E-visit).  The importance of social distancing was discussed today.  Time:   Today, I have spent 12 minutes with the patient with telehealth technology discussing the above problems.     Medication Adjustments/Labs and Tests Ordered: Current medicines are reviewed at length with the patient today.  Concerns regarding medicines are outlined above.   Tests Ordered: No orders of the defined types were placed in this encounter.   Medication Changes: No orders of the defined types were placed in this encounter.   Disposition:  Follow up in 1 year(s)  Signed, Kathlyn Sacramento, MD  01/17/2019 10:52 AM    [facility]

## 2019-01-17 NOTE — Patient Instructions (Signed)
Medication Instructions:  Continue same medications If you need a refill on your cardiac medications before your next appointment, please call your pharmacy.   Lab work: None If you have labs (blood work) drawn today and your tests are completely normal, you will receive your results only by: [name] MyChart Message (if you have MyChart) OR . A paper copy in the mail If you have any lab test that is abnormal or we need to change your treatment, we will call you to review the results.  Testing/Procedures: None  Follow-Up: At [facility], you and your health needs are our priority.  As part of our continuing mission to provide you with exceptional heart care, we have created designated Provider Care Teams.  These Care Teams include your primary Cardiologist (physician) and Advanced Practice Providers (APPs -  Physician Assistants and Nurse Practitioners) who all work together to provide you with the care you need, when you need it. You will need a follow up appointment in 12 months.  Please call our office 2 months in advance to schedule this appointment.  You may see Kathlyn Sacramento, MD or one of the following Advanced Practice Providers on your designated Care Team:   Murray Hodgkins, NP Christell Faith, PA-C . Marrianne Mood, PA-C

## 2019-01-29 ENCOUNTER — Other Ambulatory Visit: Payer: Self-pay

## 2019-01-30 ENCOUNTER — Ambulatory Visit
Admission: RE | Admit: 2019-01-30 | Discharge: 2019-01-30 | Disposition: A | Discharge status: Home / Self Care | Payer: Medicare Other | Source: Ambulatory Visit | Attending: Radiation Oncology | Admitting: Radiation Oncology

## 2019-01-30 ENCOUNTER — Encounter: Payer: Self-pay | Admitting: Radiation Oncology

## 2019-01-30 DIAGNOSIS — C50812 Malignant neoplasm of overlapping sites of left female breast: Secondary | ICD-10-CM | POA: Diagnosis not present

## 2019-01-30 DIAGNOSIS — Z79811 Long term (current) use of aromatase inhibitors: Secondary | ICD-10-CM | POA: Insufficient documentation

## 2019-01-30 DIAGNOSIS — Z923 Personal history of irradiation: Secondary | ICD-10-CM | POA: Diagnosis not present

## 2019-01-30 DIAGNOSIS — Z170 Estrogen receptor positive status [ER+]: Secondary | ICD-10-CM | POA: Diagnosis not present

## 2019-01-30 NOTE — Progress Notes (Signed)
Radiation Oncology Follow up Note  Name: Marie Parker   Date:   01/30/2019 MRN:  [id number] DOB: [birth year]    This 73 y.o. female presents to the clinic today for 90-month follow-up status post accelerated partial breast radiation to her left breast for stage I ER PR positive invasive mammary carcinoma.  REFERRING PROVIDER: Valerie Roys, DO  HPI: Patient is a 73 year old female now about 9 months having completed accelerated partial breast radiation to her left breast for an ER PR positive stage I invasive mammary carcinoma seen today in routine follow-up she is doing well specifically denies breast tenderness cough or bone pain.  Has not yet had a follow-up mammogram.  She is currently on.  Femara tolerating it well without side effect.  COMPLICATIONS OF TREATMENT: none  FOLLOW UP COMPLIANCE: keeps appointments   PHYSICAL EXAM:  BP (!) (P) 143/80 (BP Location: Left Arm, Patient Position: Sitting)   Pulse (P) 61   Resp (P) 16   Wt (P) 136 lb 9.2 oz (62 kg)   BMI (P) 27.12 kg/m  Lungs are clear to A&P cardiac examination essentially unremarkable with regular rate and rhythm. No dominant mass or nodularity is noted in either breast in 2 positions examined. Incision is well-healed. No axillary or supraclavicular adenopathy is appreciated. Cosmetic result is excellent well-developed well-nourished patient in NAD. HEENT reveals PERLA, EOMI, discs not visualized.  Oral cavity is clear. No oral mucosal lesions are identified. Neck is clear without evidence of cervical or supraclavicular adenopathy. Lungs are clear to A&P. Cardiac examination is essentially unremarkable with regular rate and rhythm without murmur rub or thrill. Abdomen is benign with no organomegaly or masses noted. Motor sensory and DTR levels are equal and symmetric in the upper and lower extremities. Cranial nerves II through XII are grossly intact. Proprioception is intact. No peripheral adenopathy or edema is identified.  No motor or sensory levels are noted. Crude visual fields are within normal range.  Plan: At this time patient is doing well with no evidence of disease.  Of asked to see her back in 1 year for follow-up.  Patient knows to call at anytime with any concerns we will follow-up on her mammograms with a are ordered.  Patient knows to call with any concerns.  She continues on Femara without side effect.  I would like to take this opportunity to thank you for allowing me to participate in the care of your patient.[name]  T

## 2019-02-10 ENCOUNTER — Ambulatory Visit: Payer: Self-pay | Admitting: Family Medicine

## 2019-02-10 NOTE — Telephone Encounter (Signed)
I have questions.   My Aunt was just admitted to the hospital with COVID-19 and I was around her.  Her husband just called me and let me know. I work in [employer].   I work the take out window.  So I'm not around people.     Tomorrow will be day 14 since I was around her and I do not have any symptoms.  I went over the symptoms with her to be sure and she denies any of them. She is going to continue monitoring herself for symptoms at this time.   I encouraged her to take her temperature twice a day as well as watching for the symptoms I went over with her. If she changes her mind and decides to be tested to please call us back or if she has additional questions   She was agreeable to this plan.  Reason for Disposition . General information question, no triage required and triager able to answer question  Answer Assessment - Initial Assessment Questions 1. REASON FOR CALL or QUESTION: "What is your reason for calling today?" or "How can I best help you?" or "What question do you have that I can help answer?"     Questions regarding the need to be tested for COVID-19  Protocols used: INFORMATION ONLY CALL-A-AH

## 2019-02-10 NOTE — Telephone Encounter (Signed)
Pt called back, states she would like to be tested as she will be around high risk family members.   CB# [phone]

## 2019-02-11 ENCOUNTER — Other Ambulatory Visit: Payer: Self-pay

## 2019-02-11 ENCOUNTER — Ambulatory Visit (INDEPENDENT_AMBULATORY_CARE_PROVIDER_SITE_OTHER): Payer: Medicare Other | Admitting: Family Medicine

## 2019-02-11 ENCOUNTER — Encounter: Payer: Self-pay | Admitting: Family Medicine

## 2019-02-11 VITALS — Temp 98.4°F

## 2019-02-11 DIAGNOSIS — Z20822 Contact with and (suspected) exposure to covid-19: Secondary | ICD-10-CM

## 2019-02-11 DIAGNOSIS — Z20828 Contact with and (suspected) exposure to other viral communicable diseases: Secondary | ICD-10-CM | POA: Diagnosis not present

## 2019-02-11 NOTE — Progress Notes (Signed)
Temp 98.4 F (36.9 C) (Oral)    Subjective:    Patient ID: Marie Parker, female    DOB: [birth year], 73 y.o.   MRN: [id number]  HPI: Marie Parker is a 72 y.o. female  Chief Complaint  Patient presents with  . Follow-up    pt states her aunt was hospitalized with COVID yesterday, states she was around her 01/29/19    . This visit was completed via WebEx due to the restrictions of the COVID-19 pandemic. All issues as above were discussed and addressed. Physical exam was done as above through visual confirmation on WebEx. If it was felt that the patient should be evaluated in the office, they were directed there. The patient verbally consented to this visit. . Location of the patient: home . Location of the provider: home . Those involved with this call:  . Provider: Merrie Roof, PA-C . CMA: Yvonna Alanis, [place] . Front Desk/Registration: Jill Side  . Time spent on call: 15 minutes with patient face to face via video conference. More than 50% of this time was spent in counseling and coordination of care. 5 minutes total spent in review of patient's record and preparation of their chart. I verified patient identity using two factors (patient name and date of birth). Patient consents verbally to being seen via telemedicine visit today.   Presenting today with a possible COVID 19 exposure 13 days ago. States she was with her aunt 6/3 and both were in their usual state of health, and then her aunt became ill 2-3 days ago and is now hospitalized for COVID 68. Has not been around any sick contacts since, and continues to feel well in her usual state of health. Denies cough, fever, chills, body aches, CP, SOB. Patient states she's just asking an opinion on when she can return to normal activities given a potential exposure.   Relevant past medical, surgical, family and social history reviewed and updated as indicated. Interim medical history since our last visit reviewed. Allergies and  medications reviewed and updated.  Review of Systems  Per HPI unless specifically indicated above     Objective:    Temp 98.4 F (36.9 C) (Oral)   Wt Readings from Last 3 Encounters:  01/30/19 (P) 136 lb 9.2 oz (62 kg)  01/17/19 133 lb 6 oz (60.5 kg)  01/13/19 138 lb (62.6 kg)    Physical Exam Vitals signs and nursing note reviewed.  Constitutional:      General: She is not in acute distress.    Appearance: Normal appearance.  HENT:     Head: Atraumatic.     Right Ear: External ear normal.     Left Ear: External ear normal.     Nose: Nose normal. No congestion.     Mouth/Throat:     Mouth: Mucous membranes are moist.     Pharynx: Oropharynx is clear. No posterior oropharyngeal erythema.  Eyes:     Extraocular Movements: Extraocular movements intact.     Conjunctiva/sclera: Conjunctivae normal.  Neck:     Musculoskeletal: Normal range of motion.  Cardiovascular:     Comments: Unable to assess via virtual visit Pulmonary:     Effort: Pulmonary effort is normal. No respiratory distress.  Musculoskeletal: Normal range of motion.  Skin:    General: Skin is dry.     Findings: No erythema.  Neurological:     Mental Status: She is alert and oriented to person, place, and time.  Psychiatric:  Mood and Affect: Mood normal.        Thought Content: Thought content normal.        Judgment: Judgment normal.     Results for orders placed or performed in visit on 01/13/19  Uric acid  Result Value Ref Range   Uric Acid 6.5 2.5 - 7.1 mg/dL  Microalbumin, Urine Waived  Result Value Ref Range   Microalb, Ur Waived 10 0 - 19 mg/L   Creatinine, Urine Waived 10 10 - 300 mg/dL   Microalb/Creat Ratio <30 <30 mg/g  Lipid Panel w/o Chol/HDL Ratio  Result Value Ref Range   Cholesterol, Total 121 100 - 199 mg/dL   Triglycerides 73 0 - 149 mg/dL   HDL 58 >39 mg/dL   VLDL Cholesterol Cal 15 5 - 40 mg/dL   LDL Calculated 48 0 - 99 mg/dL  Comprehensive metabolic panel  Result  Value Ref Range   Glucose 100 (H) 65 - 99 mg/dL   BUN 19 8 - 27 mg/dL   Creatinine, Ser 0.79 0.57 - 1.00 mg/dL   GFR calc non Af Amer 76 >59 mL/min/1.73   GFR calc Af Amer 87 >59 mL/min/1.73   BUN/Creatinine Ratio 24 12 - 28   Sodium 144 134 - 144 mmol/L   Potassium 4.1 3.5 - 5.2 mmol/L   Chloride 105 96 - 106 mmol/L   CO2 25 20 - 29 mmol/L   Calcium 9.2 8.7 - 10.3 mg/dL   Total Protein 7.0 6.0 - 8.5 g/dL   Albumin 4.6 3.7 - 4.7 g/dL   Globulin, Total 2.4 1.5 - 4.5 g/dL   Albumin/Globulin Ratio 1.9 1.2 - 2.2   Bilirubin Total 0.2 0.0 - 1.2 mg/dL   Alkaline Phosphatase 70 39 - 117 IU/L   AST 25 0 - 40 IU/L   ALT 21 0 - 32 IU/L      Assessment & Plan:   Problem List Items Addressed This Visit    None    Visit Diagnoses    Exposure to Covid-19 Virus    -  Primary   Declines testing, will be 2 full weeks from potential exposure tomorrow. OK to cautiously return to work at that time if asymptomatic        Follow up plan: Return if symptoms worsen or fail to improve.

## 2019-02-11 NOTE — Telephone Encounter (Signed)
Please schedule virtual appointment  °

## 2019-02-13 ENCOUNTER — Other Ambulatory Visit: Payer: Self-pay

## 2019-02-13 NOTE — Patient Outreach (Signed)
[facility]) Care Management  02/13/2019  Marie Parker 05/11/1946 [id number]   Medication Adherence call to [name] Hippa Identifiers Verify spoke with patient she is past due on Atorvastatin 40 mg and Benazepril 40 mg patient explain she is taking 1 tablet daily on both medications and has plenty of medication at this time patient will order when due. [name] is showing past due under [place].   [place] Management Direct Dial [phone]  Fax [phone] [email or web address]

## 2019-02-24 ENCOUNTER — Encounter: Payer: Self-pay | Admitting: Family Medicine

## 2019-02-24 ENCOUNTER — Ambulatory Visit (INDEPENDENT_AMBULATORY_CARE_PROVIDER_SITE_OTHER): Payer: Medicare Other | Admitting: Family Medicine

## 2019-02-24 ENCOUNTER — Other Ambulatory Visit: Payer: Self-pay

## 2019-02-24 ENCOUNTER — Ambulatory Visit: Payer: Self-pay | Admitting: *Deleted

## 2019-02-24 VITALS — BP 126/63 | HR 64 | Temp 98.1°F | Ht 59.5 in | Wt 134.1 lb

## 2019-02-24 DIAGNOSIS — Z636 Dependent relative needing care at home: Secondary | ICD-10-CM

## 2019-02-24 DIAGNOSIS — F419 Anxiety disorder, unspecified: Secondary | ICD-10-CM

## 2019-02-24 MED ORDER — CLONAZEPAM 0.5 MG PO TABS: 0.2500 mg | ORAL_TABLET | Freq: Two times a day (BID) | ORAL | 0 refills | Status: DC | PRN

## 2019-02-24 MED ORDER — SERTRALINE HCL 100 MG PO TABS: 100.0000 mg | ORAL_TABLET | Freq: Every day | ORAL | 1 refills | Status: DC

## 2019-02-24 NOTE — Telephone Encounter (Signed)
I'm feeling so anxious.   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   I think it's all caught up with me.     I warm transferred her call to the office to be scheduled.    I sent my notes to Dr. Durenda Age office at [facility].    Reason for Disposition . [1] Symptoms of anxiety or panic AND [2] has not been evaluated for this by physician  Answer Assessment - Initial Assessment Questions 1. CONCERN: "What happened that made you call today?"     I'm having anxiety since Friday.   My husband has been very sick since Feb.   He has been in the hospital in and out.   I think all of it is catching up with me.   "I feel like I'm going to jump out of my skin".   "I have a burning in the top of my stomach and into my chest".   I take indigestion medication for GERD.  2. ANXIETY SYMPTOM SCREENING: "Can you describe how you have been feeling?"  (e.g., tense, restless, panicky, anxious, keyed up, trouble sleeping, trouble concentrating)     I feel like I am going to jump out of my skin 3. ONSET: "How long have you been feeling this way?"     Since  Friday 4. RECURRENT: "Have you felt this way before?"  If yes: "What happened that time?" "What helped these feelings go away in the past?"      YesNo 5. RISK OF HARM - SUICIDAL IDEATION:  "Do you ever have thoughts of hurting or killing yourself?"  (e.g., yes, no, no but preoccupation with thoughts about death)   - INTENT:  "Do you have thoughts of hurting or killing yourself right NOW?" (e.g., yes, no, N/A)   - PLAN: "Do you have a specific plan for how you would do this?" (e.g., gun, knife, overdose, no plan, N/A)     No 6. RISK OF HARM - HOMICIDAL IDEATION:  "Do you ever have thoughts of hurting or killing someone else?"  (e.g., yes, no, no but preoccupation with thoughts about death)   - INTENT:  "Do you have thoughts of hurting or killing someone right NOW?" (e.g., yes, no, N/A)   - PLAN: "Do you have a specific plan for how you would do  this?" (e.g., gun, knife, no plan, N/A)      No 7. FUNCTIONAL IMPAIRMENT: "How have things been going for you overall in your life? Have you had any more difficulties than usual doing your normal daily activities?"  (e.g., better, same, worse; self-care, school, work, interactions)     See above.   Husband has been sick since Feb. 8. SUPPORT: "Who is with you now?" "Who do you live with?" "Do you have family or friends nearby who you can talk to?"      My husband. 9. THERAPIST: "Do you have a counselor or therapist? Name?"     Years ago. 10. STRESSORS: "Has there been any new stress or recent changes in your life?"       See above 11. CAFFEINE ABUSE: "Do you drink caffeinated beverages, and how much each day?" (e.g., coffee, tea, colas)       I don't drink caffience 12. SUBSTANCE ABUSE: "Do you use any illegal drugs or alcohol?"       No 13. OTHER SYMPTOMS: "Do you have any other physical symptoms right now?" (e.g., chest pain, palpitations, difficulty breathing, fever)  Burning in chest and top of stomach from GERD.   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 14. PREGNANCY: "Is there any chance you are pregnant?" "When was your last menstrual period?"       N/A due to age.  Protocols used: ANXIETY AND PANIC ATTACK-A-AH

## 2019-02-24 NOTE — Progress Notes (Signed)
BP 126/63   Pulse 64   Temp 98.1 F (36.7 C) (Oral)   Ht 4' 11.5" (1.511 m)   Wt 134 lb 2 oz (60.8 kg)   SpO2 97%   BMI 26.64 kg/m    Subjective:    Patient ID: Marie Parker, female    DOB: 1945-10-29, 73 y.o.   MRN: [id number]  HPI: Marie Parker is a 73 y.o. female  Chief Complaint  Patient presents with  . Anxiety   ANXIETY/STRESS- been getting worse over the past couple of weeks and not feeling well. Her husband has been very sick and she has been feeling a lot worse.  Duration:exacerbated Anxious mood: yes  Excessive worrying: yes Irritability: yes  Sweating: no Nausea: no Palpitations:no Hyperventilation: no Panic attacks: yes Agoraphobia: no  Obscessions/compulsions: no Depressed mood: no Depression screen [facility] 2/9 02/24/2019 01/01/2019 05/24/2018 12/24/2017 08/29/2017  Decreased Interest 0 0 0 0 0  Down, Depressed, Hopeless 0 0 1 0 0  PHQ - 2 Score 0 0 1 0 0  Altered sleeping 0 - 0 - -  Tired, decreased energy 0 - 0 - -  Change in appetite 0 - 0 - -  Feeling bad or failure about yourself  0 - 0 - -  Trouble concentrating 0 - 0 - -  Moving slowly or fidgety/restless 0 - 0 - -  Suicidal thoughts 0 - 0 - -  PHQ-9 Score 0 - 1 - -  Difficult doing work/chores Not difficult at all - - - -   GAD 7 : Generalized Anxiety Score 02/24/2019 05/24/2018 06/26/2017 06/22/2015  Nervous, Anxious, on Edge 3 1 0 3  Control/stop worrying 1 1 0 3  Worry too much - different things 1 0 0 3  Trouble relaxing 2 0 0 3  Restless 2 0 0 2  Easily annoyed or irritable 1 0 0 3  Afraid - awful might happen 1 0 0 2  Total GAD 7 Score 11 2 0 19  Anxiety Difficulty Not difficult at all - Not difficult at all Somewhat difficult   Anhedonia: no Weight changes: no Insomnia: yes hard to stay asleep  Hypersomnia: no Fatigue/loss of energy: yes Feelings of worthlessness: yes Feelings of guilt: yes Impaired concentration/indecisiveness: no Suicidal ideations: no  Crying spells: yes  Recent Stressors/Life Changes: yes   Relationship problems: yes   Family stress: yes     Financial stress: no    Job stress: no    Recent death/loss: no  Relevant past medical, surgical, family and social history reviewed and updated as indicated. Interim medical history since our last visit reviewed. Allergies and medications reviewed and updated.  Review of Systems  Constitutional: Negative.   Respiratory: Negative.   Cardiovascular: Negative.   Musculoskeletal: Negative.   Skin: Negative.   Neurological: Negative.   Psychiatric/Behavioral: Positive for sleep disturbance. Negative for agitation, behavioral problems, confusion, decreased concentration, dysphoric mood, hallucinations, self-injury and suicidal ideas. The patient is nervous/anxious. The patient is not hyperactive.     Per HPI unless specifically indicated above     Objective:    BP 126/63   Pulse 64   Temp 98.1 F (36.7 C) (Oral)   Ht 4' 11.5" (1.511 m)   Wt 134 lb 2 oz (60.8 kg)   SpO2 97%   BMI 26.64 kg/m   Wt Readings from Last 3 Encounters:  02/24/19 134 lb 2 oz (60.8 kg)  01/30/19 (P) 136 lb 9.2 oz (62 kg)  01/17/19 133  lb 6 oz (60.5 kg)    Physical Exam Vitals signs and nursing note reviewed.  Constitutional:      General: She is not in acute distress.    Appearance: Normal appearance. She is not ill-appearing, toxic-appearing or diaphoretic.  HENT:     Head: Normocephalic and atraumatic.     Right Ear: External ear normal.     Left Ear: External ear normal.     Nose: Nose normal.     Mouth/Throat:     Mouth: Mucous membranes are moist.     Pharynx: Oropharynx is clear.  Eyes:     General: No scleral icterus.       Right eye: No discharge.        Left eye: No discharge.     Conjunctiva/sclera: Conjunctivae normal.     Pupils: Pupils are equal, round, and reactive to light.  Neck:     Musculoskeletal: Normal range of motion.  Pulmonary:     Effort: Pulmonary effort is normal. No  respiratory distress.     Comments: Speaking in full sentences Musculoskeletal: Normal range of motion.  Skin:    Coloration: Skin is not jaundiced or pale.     Findings: No bruising, erythema, lesion or rash.  Neurological:     Mental Status: She is alert and oriented to person, place, and time. Mental status is at baseline.  Psychiatric:        Mood and Affect: Mood normal.        Behavior: Behavior normal.        Thought Content: Thought content normal.        Judgment: Judgment normal.     Results for orders placed or performed in visit on 01/13/19  Uric acid  Result Value Ref Range   Uric Acid 6.5 2.5 - 7.1 mg/dL  Microalbumin, Urine Waived  Result Value Ref Range   Microalb, Ur Waived 10 0 - 19 mg/L   Creatinine, Urine Waived 10 10 - 300 mg/dL   Microalb/Creat Ratio <30 <30 mg/g  Lipid Panel w/o Chol/HDL Ratio  Result Value Ref Range   Cholesterol, Total 121 100 - 199 mg/dL   Triglycerides 73 0 - 149 mg/dL   HDL 58 >39 mg/dL   VLDL Cholesterol Cal 15 5 - 40 mg/dL   LDL Calculated 48 0 - 99 mg/dL  Comprehensive metabolic panel  Result Value Ref Range   Glucose 100 (H) 65 - 99 mg/dL   BUN 19 8 - 27 mg/dL   Creatinine, Ser 0.79 0.57 - 1.00 mg/dL   GFR calc non Af Amer 76 >59 mL/min/1.73   GFR calc Af Amer 87 >59 mL/min/1.73   BUN/Creatinine Ratio 24 12 - 28   Sodium 144 134 - 144 mmol/L   Potassium 4.1 3.5 - 5.2 mmol/L   Chloride 105 96 - 106 mmol/L   CO2 25 20 - 29 mmol/L   Calcium 9.2 8.7 - 10.3 mg/dL   Total Protein 7.0 6.0 - 8.5 g/dL   Albumin 4.6 3.7 - 4.7 g/dL   Globulin, Total 2.4 1.5 - 4.5 g/dL   Albumin/Globulin Ratio 1.9 1.2 - 2.2   Bilirubin Total 0.2 0.0 - 1.2 mg/dL   Alkaline Phosphatase 70 39 - 117 IU/L   AST 25 0 - 40 IU/L   ALT 21 0 - 32 IU/L      Assessment & Plan:   Problem List Items Addressed This Visit      Other   Acute anxiety -  Primary    Not doing great. Will increase her zoloft to 100mg  daily and refill her clonazepam. Call  with any concerns. Recheck 1 month. Will also get her hooked up with caregiver stress resources. Referral to social work made today. Call with any concerns.       Relevant Medications   sertraline (ZOLOFT) 100 MG tablet   Other Relevant Orders   Referral to Chronic Care Management Services    Other Visit Diagnoses    Caregiver stress       See discussion under anxiety   Relevant Orders   Referral to Chronic Care Management Services       Follow up plan: Return in about 4 weeks (around 03/24/2019) for follow up mood.   . This visit was completed via Doximity due to the restrictions of the COVID-19 pandemic. All issues as above were discussed and addressed. Physical exam was done as above through visual confirmation on Doximity. If it was felt that the patient should be evaluated in the office, they were directed there. The patient verbally consented to this visit. . Location of the patient: home . Location of the provider: work . Those involved with this call:  . Provider: Park Liter, DO . CMA: Lesle Chris, [place] . Front Desk/Registration: Don Perking  . Time spent on call: 15 minutes with patient face to face via video conference. More than 50% of this time was spent in counseling and coordination of care. 23 minutes total spent in review of patient's record and preparation of their chart.

## 2019-02-24 NOTE — Telephone Encounter (Signed)
Seen today. 

## 2019-02-24 NOTE — Assessment & Plan Note (Signed)
Not doing great. Will increase her zoloft to 100mg  daily and refill her clonazepam. Call with any concerns. Recheck 1 month. Will also get her hooked up with caregiver stress resources. Referral to social work made today. Call with any concerns.

## 2019-03-05 ENCOUNTER — Other Ambulatory Visit: Payer: Self-pay

## 2019-03-05 ENCOUNTER — Ambulatory Visit: Payer: Self-pay | Admitting: Licensed Clinical Social Worker

## 2019-03-05 DIAGNOSIS — F419 Anxiety disorder, unspecified: Secondary | ICD-10-CM

## 2019-03-05 NOTE — Chronic Care Management (AMB) (Signed)
Chronic Care Management    Clinical Social Work General Note  03/05/2019 Name: Marie Parker MRN: [id number] DOB: 09/02/1945  Marie Parker is a 72 y.o. year old female who is a primary care patient of Johnson, Megan P, DO. The CCM was consulted to assist the patient with Mental Health Counseling and Resources and Caregiver Stress.   Marie Parker was given information about Chronic Care Management services today including:  1. CCM service includes personalized support from designated clinical staff supervised by her physician, including individualized plan of care and coordination with other care providers 2. 24/7 contact phone numbers for assistance for urgent and routine care needs. 3. Service will only be billed when office clinical staff spend 20 minutes or more in a month to coordinate care. 4. Only one practitioner may furnish and bill the service in a calendar month. 5. The patient may stop CCM services at any time (effective at the end of the month) by phone call to the office staff. 6. The patient will be responsible for cost sharing (co-pay) of up to 20% of the service fee (after annual deductible is met).  Patient agreed to services and verbal consent obtained.   Review of patient status, including review of consultants reports, relevant laboratory and other test results, and collaboration with appropriate care team members and the patient's provider was performed as part of comprehensive patient evaluation and provision of chronic care management services.    SDOH (Social Determinants of Health) screening performed today. See Care Plan Entry related to challenges with: Stress  Goals Addressed    . I want to improve my self-care and decrease my anxiety (pt-stated)       Current Barriers:  . Financial constraints . Limited social support . Level of care concerns . Mental Health Concerns  . Social Isolation . Limited access to caregiver . Lacks knowledge of community  resource: available personal care service resources within the area  Clinical Social Work Clinical Goal(s):  . Over the next 90 days, client will work with SW to address concerns related to reducing anxiety and improving self-care  Interventions: . Patient interviewed and appropriate assessments performed . Provided mental health counseling with regard to managing anxiety. Educated patient on coping methods to implement into his daily life to combat depressive symptoms and stress. Patient denied any suicidal or homicidal ideations. Encouraged patient to implement deep breathing exercises into her daily routine due to ongoing anxiety. Patient reports having therapy in the past but denies wanting LCSW to make a referral for counseling. Patient was appreciative of the offer but wishes to manage her care without mental health therapy at this time. . Provided patient with information about available personal care service resources within the area. Pt reports that spouse is doing better and they would like to see how he does without help for now before they agree to a referral to In Home Aide Services.  . CSW used motivational interviewing techniques to engage pt in meaningful conversation and to assess pt's wilingess to improve self-care and decrease her caregiver burnout symptoms. CSW explored pt's coping skills and access to community services.  .  LCSW provided reflective listening and implemented appropriate interventions to help suppport patient and her emotional needs  . Discussed plans with patient for ongoing care management follow up and provided patient with direct contact information for care management team . Advised patient to contact CCM program for any urgent needs. . Assisted patient/caregiver with obtaining information about health plan benefits .   Provided education to patient/caregiver regarding level of care options.  Patient Self Care Activities:  . Attends all scheduled provider  appointments . Calls provider office for new concerns or questions  Initial goal documentation     Follow Up Plan: SW will follow up with patient by phone next month.  Brooke Joyce, BSW, MSW, LCSW [facility] Care Management [place]  [facility] [email or web address] Phone: [phone]    

## 2019-03-17 ENCOUNTER — Telehealth: Payer: Self-pay | Admitting: Family Medicine

## 2019-03-17 ENCOUNTER — Encounter: Payer: Self-pay | Admitting: General Surgery

## 2019-03-17 ENCOUNTER — Other Ambulatory Visit: Payer: Self-pay | Admitting: *Deleted

## 2019-03-17 DIAGNOSIS — C50812 Malignant neoplasm of overlapping sites of left female breast: Secondary | ICD-10-CM

## 2019-03-17 DIAGNOSIS — Z170 Estrogen receptor positive status [ER+]: Secondary | ICD-10-CM

## 2019-03-17 NOTE — Telephone Encounter (Signed)
Copied from [place]. Topic: General - Other >> Mar 17, 2019  8:36 AM Parke Poisson wrote: Reason for CRM: Pt states she was prescribed sertraline (ZOLOFT) 100 MG tablet but is still feeling a lot of anxiety.She wants to know if you have any advice on what she should do. If you feel medication change is needed. She uses [facility], [place] [address] AT [place] [phone] (Phone) [phone] (Fax)

## 2019-03-17 NOTE — Telephone Encounter (Signed)
OK to book her earlier to discuss

## 2019-03-18 NOTE — Telephone Encounter (Signed)
Pt stated that she is ok and she is just questioning how often she can take her klonopin. She stated she is just afraid that she will get addicted or hooked to it.

## 2019-03-18 NOTE — Telephone Encounter (Signed)
If she needs it right now, she can take it 2x a day as needed. It's important that she be able to rest- and if we do this for a short time, she should not get hooked. Thanks!

## 2019-03-18 NOTE — Telephone Encounter (Signed)
Patient notified

## 2019-03-19 ENCOUNTER — Telehealth: Payer: Self-pay | Admitting: General Practice

## 2019-03-19 NOTE — Telephone Encounter (Signed)
Patient is calling and is in recalls for her to come in sometime in august. Please setup patient for an appt and schedule with one of the providers.

## 2019-03-26 ENCOUNTER — Ambulatory Visit: Payer: Self-pay | Admitting: *Deleted

## 2019-03-26 ENCOUNTER — Telehealth: Payer: Self-pay

## 2019-03-26 DIAGNOSIS — F419 Anxiety disorder, unspecified: Secondary | ICD-10-CM

## 2019-03-26 DIAGNOSIS — Z636 Dependent relative needing care at home: Secondary | ICD-10-CM

## 2019-03-26 NOTE — Chronic Care Management (AMB) (Signed)
  Chronic Care Management   Follow Up Note   03/26/2019 Name: Marie Parker MRN: [id number] DOB: Feb 01, 1946  Referred by: Valerie Roys, DO Reason for referral : Chronic Care Management (Anxiety, Caregiver stress )   [name] is a 73 y.o. year old female who is a primary care patient of Valerie Roys, DO. The CCM team was consulted for assistance with chronic disease management and care coordination needs.    Review of patient status, including review of consultants reports, relevant laboratory and other test results, and collaboration with appropriate care team members and the patient's provider was performed as part of comprehensive patient evaluation and provision of chronic care management services.    Goals Addressed            This Visit's Progress   . RNCM- I am working towards decreasing my anxiety (pt-stated)       Current Barriers:  [name] Knowledge Deficits related to caregiver strain and reducing daily anxiety . Lacks caregiver support.   Nurse Case Manager Clinical Goal(s):  [name] Over the next 90 days, patient will work with [facility] to address needs related to reducing caregiver strian  Interventions:  . Evaluation of current treatment plan related to anxiety and patient's adherence to plan as established by provider. . Advised patient to think about incorporating exercise into her daily routine, adding an essential oil diffuser to her room, a healthy diet, and taking breaks and time for herself. . Discussed plans with patient for ongoing care management follow up and provided patient with direct contact information for care management team . Provided patient with mailed written educational materials related to care giver strain and burnout.  . Discussed patient's plans to go to the beach in the next few days to be with her family and how she is looking forward to this.  Patient Self Care Activities:  . Currently UNABLE TO independently manage stress and anxiety,  self reported . Performs ADL's independently . Performs IADL's independently  Initial goal documentation         The care management team will reach out to the patient again over the next 30 days.  The patient has been provided with contact information for the care management team and has been advised to call with any health related questions or concerns.   [name] RN, BSN Nurse Case Editor, [facility]ing [facility]  [phone]) Business Mobile

## 2019-03-29 NOTE — Patient Instructions (Addendum)
Thank you allowing the Chronic Care Management Team to be a part of your care! It was a pleasure speaking with you today!   CCM (Chronic Care Management) Team   Nancylee Gaines RN, BSN Nurse Care Coordinator  [phone]  [facility] PharmD  Clinical Pharmacist  [phone]  Eula Fried LCSW Clinical Social Worker [phone]  Goals Addressed            This Visit's Progress   . RNCM- I am working towards decreasing my anxiety (pt-stated)       Current Barriers:  [name] Knowledge Deficits related to caregiver strain and reducing daily anxiety . Lacks caregiver support.   Nurse Case Manager Clinical Goal(s):  [name] Over the next 90 days, patient will work with [facility] to address needs related to reducing caregiver strian  Interventions:  . Evaluation of current treatment plan related to anxiety and patient's adherence to plan as established by provider. . Advised patient to think about incorporating exercise into her daily routine, adding an essential oil diffuser to her room, a healthy diet, and taking breaks and time for herself. . Discussed plans with patient for ongoing care management follow up and provided patient with direct contact information for care management team . Provided patient with mailed written educational materials related to care giver strain and burnout.   Patient Self Care Activities:  . Currently UNABLE TO independently manage stress and anxiety, self reported . Performs ADL's independently . Performs IADL's independently  Initial goal documentation        The patient verbalized understanding of instructions provided today and declined a print copy of patient instruction materials.   The patient has been provided with contact information for the care management team and has been advised to call with any health related questions or concerns.     Stress Stress is a normal reaction to life events. Stress is what you feel when life demands more than  you are used to, or more than you think you can handle. Some stress can be useful, such as studying for a test or meeting a deadline at work. Stress that occurs too often or for too long can cause problems. It can affect your emotional health and interfere with relationships and normal daily activities. Too much stress can weaken your body's defense system (immune system) and increase your risk for physical illness. If you already have a medical problem, stress can make it worse. What are the causes? All sorts of life events can cause stress. An event that causes stress for one person may not be stressful for another person. Major life events, whether positive or negative, commonly cause stress. Examples include:  Losing a job or starting a new job.  Losing a loved one.  Moving to a new town or home.  Getting married or divorced.  Having a baby.  Injury or illness. Less obvious life events can also cause stress, especially if they occur day after day or in combination with each other. Examples include:  Working long hours.  Driving in traffic.  Caring for children.  Being in debt.  Being in a difficult relationship. What are the signs or symptoms? Stress can cause emotional symptoms, including:  Anxiety. This is feeling worried, afraid, on edge, overwhelmed, or out of control.  Anger, including irritation or impatience.  Depression. This is feeling sad, down, helpless, or guilty.  Trouble focusing, remembering, or making decisions. Stress can cause physical symptoms, including:  Aches and pains. These may affect  your head, neck, back, stomach, or other areas of your body.  Tight muscles or a clenched jaw.  Low energy.  Trouble sleeping. Stress can cause unhealthy behaviors, including:  Eating to feel better (overeating) or skipping meals.  Working too much or putting off tasks.  Smoking, drinking alcohol, or using drugs to feel better. How is this diagnosed?  Stress is diagnosed through an assessment by your health care provider. He or she may diagnose this condition based on:  Your symptoms and any stressful life events.  Your medical history.  Tests to rule out other causes of your symptoms. Depending on your condition, your health care provider may refer you to a specialist for further evaluation. How is this treated?  Stress management techniques are the recommended treatment for stress. Medicine is not typically recommended for the treatment of stress. Techniques to reduce your reaction to stressful life events include:  Stress identification. Monitor yourself for symptoms of stress and identify what causes stress for you. These skills may help you to avoid or prepare for stressful events.  Time management. Set your priorities, keep a calendar of events, and learn to say "no." Taking these actions can help you avoid making too many commitments. Techniques for coping with stress include:  Rethinking the problem. Try to think realistically about stressful events rather than ignoring them or overreacting. Try to find the positives in a stressful situation rather than focusing on the negatives.  Exercise. Physical exercise can release both physical and emotional tension. The key is to find a form of exercise that you enjoy and do it regularly.  Relaxation techniques. These relax the body and mind. The key is to find one or more that you enjoy and use the technique(s) regularly. Examples include: ? Meditation, deep breathing, or progressive relaxation techniques. ? Yoga or tai chi. ? Biofeedback, mindfulness techniques, or journaling. ? Listening to music, being out in nature, or participating in other hobbies.  Practicing a healthy lifestyle. Eat a balanced diet, drink plenty of water, limit or avoid caffeine, and get plenty of sleep.  Having a strong support network. Spend time with family, friends, or other people you enjoy being around.  Express your feelings and talk things over with someone you trust. Counseling or talk therapy with a mental health professional may be helpful if you are having trouble managing stress on your own. Follow these instructions at home: Lifestyle   Avoid drugs.  Do not use any products that contain nicotine or tobacco, such as cigarettes and e-cigarettes. If you need help quitting, ask your health care provider.  Limit alcohol intake to no more than 1 drink a day for nonpregnant women and 2 drinks a day for men. One drink equals 12 oz of beer, 5 oz of wine, or 1 oz of hard liquor.  Do not use alcohol or drugs to relax.  Eat a balanced diet that includes fresh fruits and vegetables, whole grains, lean meats, fish, eggs, and beans, and low-fat dairy. Avoid processed foods and foods high in added fat, sugar, and salt.  Exercise at least 30 minutes on 5 or more days each week.  Get 7-8 hours of sleep each night. General instructions   Practice stress management techniques as discussed with your health care provider.  Drink enough fluid to keep your urine clear or pale yellow.  Take over-the-counter and prescription medicines only as told by your health care provider.  Keep all follow-up visits as told by your health care provider.  This is important. Contact a health care provider if:  Your symptoms get worse.  You have new symptoms.  You feel overwhelmed by your problems and can no longer manage them on your own. Get help right away if:  You have thoughts of hurting yourself or others. If you ever feel like you may hurt yourself or others, or have thoughts about taking your own life, get help right away. You can go to your nearest emergency department or call:  Your local emergency services (911 in the [place]  A suicide crisis helpline, such as the [facility] at 507-207-6958. This is open 24 hours a day. Summary  Stress is a normal reaction to life  events. It can cause problems if it happens too often or for too long.  Practicing stress management techniques is the best way to treat stress.  Counseling or talk therapy with a mental health professional may be helpful if you are having trouble managing stress on your own. This information is not intended to replace advice given to you by your health care provider. Make sure you discuss any questions you have with your health care provider. Document Released: 02/07/2001 Document Revised: 07/27/2017 Document Reviewed: 10/04/2016 Elsevier Patient Education  [employer].   Panic Attack  A panic attack is when you suddenly feel very afraid, uncomfortable, or nervous (anxious). A panic attack can happen when you are scared or for no reason. A panic attack can feel like a serious problem. It can even feel like a heart attack or stroke. See your doctor when you have a panic attack to make sure you do not have a serious problem. Follow these instructions at home:  Take medicines only as told by your doctor.  If you feel worried or nervous, try not to have caffeine.  Take good care of your health. To do this: ? Eat healthy. Make sure to eat fresh fruits and vegetables, whole grains, lean meats, and low-fat dairy. ? Get enough sleep. Try to sleep for 7-8 hours each night. ? Exercise. Try to be active for 30 minutes 5 or more days a week. ? Do not smoke. Talk to your doctor if you need help quitting. ? Limit how much alcohol you drink:  If you are a woman who is not pregnant: try not to have more than 1 drink a day.  If you are a man: try not to have more than 2 drinks a day.  One drink equals 12 oz of beer, 5 oz of wine, or 1 oz of hard liquor.  Keep all follow-up visits as told by your doctor. This is important. Contact a doctor if:  Your symptoms do not get better.  Your symptoms get worse.  You are not able to take your medicines as told. Get help right away if:  You  have thoughts of hurting yourself or others.  You have symptoms of a panic attack. Do not drive yourself to the hospital. Have someone else drive you or call an ambulance. If you feel like you may hurt yourself or others, or have thoughts about taking your own life, get help right away. You can go to your nearest emergency department or call:  Your local emergency services (911 in the [place]  A suicide crisis helpline, such as the [place] [facility] at 910-314-6560. This is open 24 hours a day. Summary  A panic attack is when you suddenly feel very afraid, uncomfortable, or nervous (anxious).  See  your doctor when you have a panic attack to make sure that you do not have another serious problem.  If you feel like you may hurt yourself or others, get help right away by calling 911. This information is not intended to replace advice given to you by your health care provider. Make sure you discuss any questions you have with your health care provider. Document Released: 09/16/2010 Document Revised: 07/27/2017 Document Reviewed: 09/27/2016 Elsevier Patient Education  2020 [employer].

## 2019-03-31 ENCOUNTER — Telehealth: Payer: Self-pay | Admitting: Family Medicine

## 2019-03-31 NOTE — Telephone Encounter (Signed)
Called pt to reschedule physical due to provider being on vacation, no answer, left vm

## 2019-04-07 ENCOUNTER — Encounter: Payer: Medicare Other | Admitting: Family Medicine

## 2019-04-09 ENCOUNTER — Ambulatory Visit: Payer: Self-pay | Admitting: Licensed Clinical Social Worker

## 2019-04-09 ENCOUNTER — Telehealth: Payer: Self-pay

## 2019-04-09 NOTE — Chronic Care Management (AMB) (Signed)
  Chronic Care Management    Clinical Social Work Follow Up Note  04/09/2019 Name: Eleonora Peeler MRN: [id number] DOB: May 05, 1946  Lexiana Spindel is a 73 y.o. year old female who is a primary care patient of Valerie Roys, DO. The CCM team was consulted for assistance with Mental Health Counseling and Resources.   Review of patient status, including review of consultants reports, other relevant assessments, and collaboration with appropriate care team members and the patient's provider was performed as part of comprehensive patient evaluation and provision of chronic care management services.     LCSW completed CCM outreach attempt today but was unable to reach patient successfully. A HIPPA compliant voice message was left encouraging patient to return call once available. LCSW rescheduled CCM SW appointment as well.  Follow Up Plan: SW will follow up with patient by phone over the next month  Eula Fried, [place], MSW, [email or web address] Phone: [phone]

## 2019-04-10 ENCOUNTER — Ambulatory Visit
Admission: RE | Admit: 2019-04-10 | Discharge: 2019-04-10 | Disposition: A | Discharge status: Home / Self Care | Payer: Medicare Other | Source: Ambulatory Visit | Attending: Surgery | Admitting: Surgery

## 2019-04-10 DIAGNOSIS — C50812 Malignant neoplasm of overlapping sites of left female breast: Secondary | ICD-10-CM | POA: Diagnosis not present

## 2019-04-10 DIAGNOSIS — Z170 Estrogen receptor positive status [ER+]: Secondary | ICD-10-CM

## 2019-04-10 DIAGNOSIS — R922 Inconclusive mammogram: Secondary | ICD-10-CM | POA: Diagnosis not present

## 2019-04-11 ENCOUNTER — Telehealth: Payer: Self-pay

## 2019-04-11 NOTE — Telephone Encounter (Signed)
Patient notified and follow up appointment scheduled.

## 2019-04-14 NOTE — Telephone Encounter (Signed)
Patient is coming in tomorrow to see Dr. Hampton Abbot

## 2019-04-15 ENCOUNTER — Encounter: Payer: Self-pay | Admitting: Surgery

## 2019-04-15 ENCOUNTER — Other Ambulatory Visit: Payer: Self-pay

## 2019-04-15 ENCOUNTER — Ambulatory Visit: Payer: Medicare Other | Admitting: Surgery

## 2019-04-15 VITALS — BP 166/88 | HR 65 | Temp 97.9°F | Ht 59.5 in | Wt 136.2 lb

## 2019-04-15 DIAGNOSIS — C50812 Malignant neoplasm of overlapping sites of left female breast: Secondary | ICD-10-CM

## 2019-04-15 DIAGNOSIS — Z170 Estrogen receptor positive status [ER+]: Secondary | ICD-10-CM

## 2019-04-15 NOTE — Patient Instructions (Signed)
The patient is aware to call back for any questions or new concerns.  

## 2019-04-15 NOTE — Progress Notes (Signed)
04/15/2019  History of Present Illness: Marie Parker is a 73 y.o. female status post left breast lumpectomy and sentinel lymph node biopsy for breast cancer on 04/22/2018.  She is also status post radiation.  She presents today for follow-up in 1 year out.  She denies any new changes.  Denies any skin changes, new masses, nipple changes or drainage.  She had a mammogram on 04/10/2019 showing only surgical changes but no suspicious findings.  Past Medical History: Past Medical History:  Diagnosis Date  . Anxiety   . Arthritis   . Breast cancer of upper-outer quadrant of left female breast ([place]) 04/22/2018   16 mm, T1c, N0.  ER/PR positive, HER-2/neu not overexpressed.  Low MammaPrint score.    [name] CAD (coronary artery disease)   . Cancer (HCC)    squamous cell  . Cataract   . Cholecystitis 04/10/2018  . Chronic kidney disease   . Colon cancer screening 2017?   negative cologuard  . Depression   . Family history of adverse reaction to anesthesia    mother had N/V  . Generalized osteoarthritis   . GERD (gastroesophageal reflux disease)   . Gout   . History of endoscopy 11/2012   Gastritis  . Hyperlipidemia   . Hypertension   . IFG (impaired fasting glucose)   . Insomnia   . Menopause   . Osteoporosis   . Parathyroid adenoma   . Personal history of radiation therapy 2020   Left breast  . Personal history of tobacco use, presenting hazards to health 02/02/2016  . PVD (peripheral vascular disease) ([place])   . PVD (peripheral vascular disease) ([place])   . Rotator cuff syndrome of shoulder and allied disorders   . Tobacco abuse      Past Surgical History: Past Surgical History:  Procedure Laterality Date  . BREAST BIOPSY Left 2014   COMPLEX FIBROADENOMA  . BREAST BIOPSY Left 04/05/2018   [facility]  . BREAST EXCISIONAL BIOPSY Left 1970's   neg  . BREAST FIBROADENOMA SURGERY    . BREAST MAMMOSITE Left 04/2018  . CARDIAC CATHETERIZATION  09/17/2007   Insignificant CAD with 40-50%  stenosis of mid LAD.   [name] ILIAC ARTERY STENT     right iliac artery stent   . MASTECTOMY, PARTIAL Left 04/22/2018   Procedure: MASTECTOMY PARTIAL;  Surgeon: Robert Bellow, MD;  Location: [facility] ORS;  Service: General;  Laterality: Left;  . ORIF PATELLA Left 03/22/2016   Dr. Tristan Schroeder  . PARATHYROIDECTOMY  2012   [facility]  . SENTINEL NODE BIOPSY Left 04/22/2018   Procedure: SENTINEL NODE BIOPSY;  Surgeon: Robert Bellow, MD;  Location: [facility] ORS;  Service: General;  Laterality: Left;  . SKIN BIOPSY     precancerous    Home Medications: Prior to Admission medications   Medication Sig Start Date End Date Taking? Authorizing Provider  acetaminophen (TYLENOL) 650 MG CR tablet Take 1,950 mg by mouth daily as needed for pain.   Yes [provider]  amLODipine (NORVASC) 10 MG tablet Take 1 tablet (10 mg total) by mouth daily. 01/02/19  Yes Johnson, Megan P, DO  aspirin 81 MG tablet Take 1 tablet (81 mg total) by mouth daily. Patient taking differently: Take 81 mg by mouth at bedtime.  12/27/17  Yes Johnson, Megan P, DO  atenolol (TENORMIN) 50 MG tablet Take 1 tablet (50 mg total) by mouth daily. 01/02/19  Yes Johnson, Megan P, DO  atorvastatin (LIPITOR) 40 MG tablet Take 1 tablet (40 mg total)  by mouth daily. 01/02/19  Yes Johnson, Megan P, DO  benazepril (LOTENSIN) 40 MG tablet Take 1 tablet (40 mg total) by mouth daily. 01/02/19  Yes Johnson, Megan P, DO  CALCIUM PO Take by mouth.   Yes [provider]  clidinium-chlordiazePOXIDE (LIBRAX) 5-2.5 MG capsule TAKE 1 CAPSULE BY MOUTH TWICE DAILY FOR  LUNCH  AND  DINNER Patient taking differently: TAKE 1 CAPSULE BY MOUTH DAILY FOR  LUNCH  AND  DINNER 01/02/19  Yes Johnson, Megan P, DO  clonazePAM (KLONOPIN) 0.5 MG tablet Take 0.5-1 tablets (0.25-0.5 mg total) by mouth 2 (two) times daily as needed for anxiety. 02/24/19  Yes Johnson, Megan P, DO  diphenhydrAMINE (BENADRYL) 25 mg capsule Take 25 mg by mouth at bedtime.    Yes [provider]   fluticasone (FLONASE) 50 MCG/ACT nasal spray USE 2 SPRAYS IN EACH  NOSTRIL DAILY 02/25/18  Yes Johnson, Megan P, DO  hydrALAZINE (APRESOLINE) 25 MG tablet Take 1 tablet (25 mg total) by mouth 2 (two) times daily. 01/02/19  Yes Johnson, Megan P, DO  letrozole (FEMARA) 2.5 MG tablet Take 1 tablet (2.5 mg total) by mouth daily. 05/30/18  Yes Byrnett, Forest Gleason, MD  niacin 500 MG tablet Take 1,000 mg by mouth at bedtime.   Yes [provider]  Olopatadine HCl 0.2 % SOLN Place 1 drop into the left eye daily.   Yes [provider]  pantoprazole (PROTONIX) 40 MG tablet Take 1 tablet (40 mg total) by mouth daily. 01/02/19  Yes Johnson, Megan P, DO  Probiotic Product (PROBIOTIC-10 PO) Take by mouth daily.   Yes [provider]  sertraline (ZOLOFT) 100 MG tablet Take 1 tablet (100 mg total) by mouth daily. 02/24/19  Yes Johnson, Megan P, DO  tiZANidine (ZANAFLEX) 4 MG tablet TAKE 1 TABLET(4 MG) BY MOUTH EVERY 6 HOURS AS NEEDED FOR MUSCLE SPASMS 12/27/17  Yes Johnson, Megan P, DO  Turmeric 500 MG TABS Take 500 mg by mouth daily.   Yes [provider]    Allergies: Allergies  Allergen Reactions  . Crestor [Rosuvastatin] Nausea Only  . Cyclobenzaprine     Vertigo, couldn't focus eyes  . Erythromycin     GI upset   . Fosamax [Alendronate Sodium] Nausea And Vomiting    Review of Systems: Review of Systems  Constitutional: Negative for chills and fever.  Respiratory: Negative for shortness of breath.   Cardiovascular: Negative for chest pain.  Gastrointestinal: Negative for nausea and vomiting.  Skin: Negative for rash.    Physical Exam BP (!) 166/88   Pulse 65   Temp 97.9 F (36.6 C) (Temporal)   Ht 4' 11.5" (1.511 m)   Wt 136 lb 3.2 oz (61.8 kg)   SpO2 98%   BMI 27.05 kg/m  CONSTITUTIONAL: No acute distress HEENT:  Normocephalic, atraumatic, extraocular motion intact. RESPIRATORY:  Lungs are clear, and breath sounds are equal bilaterally. Normal respiratory  effort without pathologic use of accessory muscles. CARDIOVASCULAR: Heart is regular without murmurs, gallops, or rubs. BREAST: Left breast status post lumpectomy with scar well-healed.  No new palpable masses, skin changes, nipple retraction, or drainage.  There is only palpable area of scarring right beneath the incision in the upper outer quadrant.  Left axillary incision from sentinel node biopsy is well-healed as well with no palpable lymphadenopathy.  Right breast exam and axilla negative. GI: The abdomen is soft, nondistended, nontender.  NEUROLOGIC:  Motor and sensation is grossly normal.  Cranial nerves are grossly  intact. PSYCH:  Alert and oriented to person, place and time. Affect is normal.  Labs/Imagin/g: Mammogram 04/10/19 FINDINGS: Expected surgical changes noted in the upper slightly outer left breast consistent with history of lumpectomy. No suspicious calcifications, masses or areas of distortion are seen in the bilateral breasts.  Mammographic images were processed with CAD.  IMPRESSION: Expected surgical changes at the left breast lumpectomy site. No mammographic evidence of malignancy in the bilateral breasts.   Assessment and Plan: This is a 73 y.o. female s/p left breast lumpectomy and SLNBx on 04/22/18  Patient is doing well without new findings.  Will follow up in 6 months for physical exam.  Eventually, since Dr. Bary Castilla is not in the practice, will need referral to oncology for Femara maintenance.  We will do this in 03/2020 when she's due for another visit with mammogram.  Face-to-face time spent with the patient and care providers was 15 minutes, with more than 50% of the time spent counseling, educating, and coordinating care of the patient.     Melvyn Neth, [facility]

## 2019-04-16 ENCOUNTER — Telehealth: Payer: Self-pay

## 2019-04-24 ENCOUNTER — Other Ambulatory Visit: Payer: Self-pay

## 2019-04-24 ENCOUNTER — Ambulatory Visit (INDEPENDENT_AMBULATORY_CARE_PROVIDER_SITE_OTHER): Payer: Medicare Other | Admitting: Family Medicine

## 2019-04-24 ENCOUNTER — Encounter: Payer: Self-pay | Admitting: Family Medicine

## 2019-04-24 VITALS — BP 144/88 | HR 62 | Temp 98.6°F

## 2019-04-24 DIAGNOSIS — M2041 Other hammer toe(s) (acquired), right foot: Secondary | ICD-10-CM

## 2019-04-24 DIAGNOSIS — I739 Peripheral vascular disease, unspecified: Secondary | ICD-10-CM | POA: Diagnosis not present

## 2019-04-24 DIAGNOSIS — K219 Gastro-esophageal reflux disease without esophagitis: Secondary | ICD-10-CM

## 2019-04-24 DIAGNOSIS — I129 Hypertensive chronic kidney disease with stage 1 through stage 4 chronic kidney disease, or unspecified chronic kidney disease: Secondary | ICD-10-CM | POA: Diagnosis not present

## 2019-04-24 DIAGNOSIS — F419 Anxiety disorder, unspecified: Secondary | ICD-10-CM

## 2019-04-24 DIAGNOSIS — C50812 Malignant neoplasm of overlapping sites of left female breast: Secondary | ICD-10-CM | POA: Diagnosis not present

## 2019-04-24 DIAGNOSIS — E782 Mixed hyperlipidemia: Secondary | ICD-10-CM

## 2019-04-24 DIAGNOSIS — D692 Other nonthrombocytopenic purpura: Secondary | ICD-10-CM | POA: Diagnosis not present

## 2019-04-24 DIAGNOSIS — M1A9XX0 Chronic gout, unspecified, without tophus (tophi): Secondary | ICD-10-CM | POA: Diagnosis not present

## 2019-04-24 DIAGNOSIS — Z23 Encounter for immunization: Secondary | ICD-10-CM

## 2019-04-24 DIAGNOSIS — M2042 Other hammer toe(s) (acquired), left foot: Secondary | ICD-10-CM

## 2019-04-24 DIAGNOSIS — Z0000 Encounter for general adult medical examination without abnormal findings: Secondary | ICD-10-CM | POA: Diagnosis not present

## 2019-04-24 DIAGNOSIS — I2510 Atherosclerotic heart disease of native coronary artery without angina pectoris: Secondary | ICD-10-CM

## 2019-04-24 LAB — UA/M W/RFLX CULTURE, ROUTINE
Bilirubin, UA: NEGATIVE
Glucose, UA: NEGATIVE
Ketones, UA: NEGATIVE
Leukocytes,UA: NEGATIVE
Nitrite, UA: NEGATIVE
Protein,UA: NEGATIVE
RBC, UA: NEGATIVE
Specific Gravity, UA: <1.005 — ABNORMAL LOW (ref 1.005–1.030)
Urobilinogen, Ur: 0.2 mg/dL (ref 0.2–1.0)
pH, UA: 5.5 (ref 5.0–7.5)

## 2019-04-24 LAB — MICROALBUMIN, URINE WAIVED
Creatinine, Urine Waived: 10 mg/dL (ref 10–300)
Microalb, Ur Waived: 10 mg/L (ref 0–19)
Microalb/Creat Ratio: <30 mg/g (ref <30)

## 2019-04-24 MED ORDER — CILIDINIUM-CHLORDIAZEPOXIDE 2.5-5 MG PO CAPS: ORAL_CAPSULE | ORAL | 0 refills | Status: DC

## 2019-04-24 MED ORDER — CLONAZEPAM 0.5 MG PO TABS: 0.2500 mg | ORAL_TABLET | Freq: Two times a day (BID) | ORAL | 0 refills | Status: DC | PRN

## 2019-04-24 NOTE — Progress Notes (Signed)
BP (!) 144/88    Pulse 62    Temp 98.6 F (37 C)    SpO2 100%    Subjective:    Patient ID: Marie Parker, female    DOB: Sep 12, 1945, 73 y.o.   MRN: [id number]  HPI: Marie Parker is a 73 y.o. female presenting on 04/24/2019 for comprehensive medical examination. Current medical complaints include:  HYPERTENSION / HYPERLIPIDEMIA Satisfied with current treatment? yes Duration of hypertension: chronic BP monitoring frequency: not checking BP medication side effects: no Past BP meds: benazepril, atenolol, amlodipine, hydralazine Duration of hyperlipidemia: chronic Cholesterol medication side effects: yes Cholesterol supplements: none Past cholesterol medications: atorvastatin Medication compliance: excellent compliance Aspirin: no Recent stressors: no Recurrent headaches: no Visual changes: no Palpitations: no Dyspnea: no Chest pain: no Lower extremity edema: no Dizzy/lightheaded: no  Gout- no flares, doing well  ANXIETY/STRESS Duration:better Anxious mood: yes  Excessive worrying: yes Irritability: no  Sweating: no Nausea: no Palpitations:no Hyperventilation: no Panic attacks: no Agoraphobia: no  Obscessions/compulsions: no Depressed mood: yes Depression screen [facility] 2/9 04/24/2019 02/24/2019 01/01/2019 05/24/2018 12/24/2017  Decreased Interest 0 0 0 0 0  Down, Depressed, Hopeless 0 0 0 1 0  PHQ - 2 Score 0 0 0 1 0  Altered sleeping 0 0 - 0 -  Tired, decreased energy 1 0 - 0 -  Change in appetite 0 0 - 0 -  Feeling bad or failure about yourself  0 0 - 0 -  Trouble concentrating 0 0 - 0 -  Moving slowly or fidgety/restless 0 0 - 0 -  Suicidal thoughts 0 0 - 0 -  PHQ-9 Score 1 0 - 1 -  Difficult doing work/chores Not difficult at all Not difficult at all - - -   GAD 7 : Generalized Anxiety Score 02/24/2019 05/24/2018 06/26/2017 06/22/2015  Nervous, Anxious, on Edge 3 1 0 3  Control/stop worrying 1 1 0 3  Worry too much - different things 1 0 0 3  Trouble  relaxing 2 0 0 3  Restless 2 0 0 2  Easily annoyed or irritable 1 0 0 3  Afraid - awful might happen 1 0 0 2  Total GAD 7 Score 11 2 0 19  Anxiety Difficulty Not difficult at all - Not difficult at all Somewhat difficult   Anhedonia: no Weight changes: no Insomnia: no   Hypersomnia: no Fatigue/loss of energy: no Feelings of worthlessness: no Feelings of guilt: no Impaired concentration/indecisiveness: no Suicidal ideations: no  Crying spells: no Recent Stressors/Life Changes: yes   Relationship problems: yes   Family stress: yes     Financial stress: no    Job stress: no    Recent death/loss: no  She currently lives with: husband Menopausal Symptoms: no  Depression Screen done today and results listed below:  Depression screen [facility] 2/9 04/24/2019 02/24/2019 01/01/2019 05/24/2018 12/24/2017  Decreased Interest 0 0 0 0 0  Down, Depressed, Hopeless 0 0 0 1 0  PHQ - 2 Score 0 0 0 1 0  Altered sleeping 0 0 - 0 -  Tired, decreased energy 1 0 - 0 -  Change in appetite 0 0 - 0 -  Feeling bad or failure about yourself  0 0 - 0 -  Trouble concentrating 0 0 - 0 -  Moving slowly or fidgety/restless 0 0 - 0 -  Suicidal thoughts 0 0 - 0 -  PHQ-9 Score 1 0 - 1 -  Difficult doing work/chores Not difficult at all Not  difficult at all - - -    Past Medical History:  Past Medical History:  Diagnosis Date   Anxiety    Arthritis    Breast cancer of upper-outer quadrant of left female breast ([place]) 04/22/2018   16 mm, T1c, N0.  ER/PR positive, HER-2/neu not overexpressed.  Low MammaPrint score.     CAD (coronary artery disease)    Cancer (HCC)    squamous cell   Cataract    Cholecystitis 04/10/2018   Chronic kidney disease    Colon cancer screening 2017?   negative cologuard   Depression    Family history of adverse reaction to anesthesia    mother had N/V   Generalized osteoarthritis    GERD (gastroesophageal reflux disease)    Gout    History of endoscopy 11/2012    Gastritis   Hyperlipidemia    Hypertension    IFG (impaired fasting glucose)    Insomnia    Menopause    Osteoporosis    Parathyroid adenoma    Personal history of radiation therapy 2020   Left breast   Personal history of tobacco use, presenting hazards to health 02/02/2016   PVD (peripheral vascular disease) ([place])    PVD (peripheral vascular disease) ([place])    Rotator cuff syndrome of shoulder and allied disorders    Tobacco abuse     Surgical History:  Past Surgical History:  Procedure Laterality Date   BREAST BIOPSY Left 2014   COMPLEX FIBROADENOMA   BREAST BIOPSY Left 04/05/2018   [facility]   BREAST EXCISIONAL BIOPSY Left 1970's   neg   BREAST FIBROADENOMA SURGERY     BREAST MAMMOSITE Left 04/2018   CARDIAC CATHETERIZATION  09/17/2007   Insignificant CAD with 40-50% stenosis of mid LAD.    ILIAC ARTERY STENT     right iliac artery stent    MASTECTOMY, PARTIAL Left 04/22/2018   Procedure: MASTECTOMY PARTIAL;  Surgeon: Robert Bellow, MD;  Location: [facility] ORS;  Service: General;  Laterality: Left;   ORIF PATELLA Left 03/22/2016   Dr. Tristan Schroeder   PARATHYROIDECTOMY  2012   [facility]   SENTINEL NODE BIOPSY Left 04/22/2018   Procedure: SENTINEL NODE BIOPSY;  Surgeon: Robert Bellow, MD;  Location: [facility] ORS;  Service: General;  Laterality: Left;   SKIN BIOPSY     precancerous    Medications:  Current Outpatient Medications on File Prior to Visit  Medication Sig   acetaminophen (TYLENOL) 650 MG CR tablet Take 1,950 mg by mouth daily as needed for pain.   amLODipine (NORVASC) 10 MG tablet Take 1 tablet (10 mg total) by mouth daily.   aspirin 81 MG tablet Take 1 tablet (81 mg total) by mouth daily. (Patient taking differently: Take 81 mg by mouth at bedtime. )   atenolol (TENORMIN) 50 MG tablet Take 1 tablet (50 mg total) by mouth daily.   atorvastatin (LIPITOR) 40 MG tablet Take 1 tablet (40 mg total) by mouth daily.   benazepril (LOTENSIN) 40 MG  tablet Take 1 tablet (40 mg total) by mouth daily.   CALCIUM PO Take by mouth.   diphenhydrAMINE (BENADRYL) 25 mg capsule Take 25 mg by mouth at bedtime.    fluticasone (FLONASE) 50 MCG/ACT nasal spray USE 2 SPRAYS IN EACH  NOSTRIL DAILY   hydrALAZINE (APRESOLINE) 25 MG tablet Take 1 tablet (25 mg total) by mouth 2 (two) times daily.   letrozole (FEMARA) 2.5 MG tablet Take 1 tablet (2.5 mg total) by mouth daily.  niacin 500 MG tablet Take 1,000 mg by mouth at bedtime.   Olopatadine HCl 0.2 % SOLN Place 1 drop into the left eye daily.   pantoprazole (PROTONIX) 40 MG tablet Take 1 tablet (40 mg total) by mouth daily.   Probiotic Product (PROBIOTIC-10 PO) Take by mouth daily.   sertraline (ZOLOFT) 100 MG tablet Take 1 tablet (100 mg total) by mouth daily.   tiZANidine (ZANAFLEX) 4 MG tablet TAKE 1 TABLET(4 MG) BY MOUTH EVERY 6 HOURS AS NEEDED FOR MUSCLE SPASMS   Turmeric 500 MG TABS Take 500 mg by mouth daily.   No current facility-administered medications on file prior to visit.     Allergies:  Allergies  Allergen Reactions   Crestor [Rosuvastatin] Nausea Only   Cyclobenzaprine     Vertigo, couldn't focus eyes   Erythromycin     GI upset    Fosamax [Alendronate Sodium] Nausea And Vomiting    Social History:  Social History   Socioeconomic History   Marital status: Married    Spouse name: Not on file   Number of children: Not on file   Years of education: Not on file   Highest education level: High school graduate  Occupational History   Occupation: part time [employer] [place] resource strain: Not hard at all   Food insecurity    Worry: Never true    Inability: Never true   Transportation needs    Medical: No    Non-medical: No  Tobacco Use   Smoking status: Former Smoker    Packs/day: 1.00    Years: 42.00    Pack years: 42.00    Types: Cigarettes    Quit date: 08/28/2000    Years since quitting: 18.6   Smokeless  tobacco: Never Used  Substance and Sexual Activity   Alcohol use: No   Drug use: No   Sexual activity: Never  Lifestyle   Physical activity    Days per week: 0 days    Minutes per session: 0 min   Stress: Not at all  Relationships   Social connections    Talks on phone: More than three times a week    Gets together: More than three times a week    Attends religious service: More than 4 times per year    Active member of club or organization: Yes    Attends meetings of clubs or organizations: More than 4 times per year    Relationship status: Married   Intimate partner violence    Fear of current or ex partner: No    Emotionally abused: No    Physically abused: No    Forced sexual activity: No  Other Topics Concern   Not on file  Social History Narrative   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    Social History   Tobacco Use  Smoking Status Former Smoker   Packs/day: 1.00   Years: 42.00   Pack years: 42.00   Types: Cigarettes   Quit date: 08/28/2000   Years since quitting: 18.6  Smokeless Tobacco Never Used   Social History   Substance and Sexual Activity  Alcohol Use No    Family History:  Family History  Problem Relation Age of Onset   Heart attack Father 4   Alcohol abuse Father    Heart disease Father    Liver disease Father    Hypertension Father    Heart disease Sister 71  CAD; s/p stent placement   Stroke Sister    AAA (abdominal aortic aneurysm) Sister    Dementia Sister    Stroke Mother    Hypertension Mother    Diabetes Mother    Stroke Maternal Grandmother    Heart attack Maternal Grandfather    Stroke Paternal Grandmother    Breast cancer Maternal Aunt 42   Breast cancer Paternal Aunt    Bone cancer Paternal Uncle     Past medical history, surgical history, medications, allergies, family history and social history reviewed with patient today and changes made to appropriate areas of  the chart.   Review of Systems  Constitutional: Positive for diaphoresis. Negative for chills, fever, malaise/fatigue and weight loss.  HENT: Negative.   Eyes: Positive for blurred vision. Negative for double vision, photophobia, pain, discharge and redness.  Respiratory: Positive for cough. Negative for hemoptysis, sputum production, shortness of breath and wheezing.   Cardiovascular: Negative.   Genitourinary: Negative.   Musculoskeletal: Positive for joint pain. Negative for back pain, falls, myalgias and neck pain.  Skin: Negative.   Neurological: Negative.   Endo/Heme/Allergies: Positive for environmental allergies. Negative for polydipsia. Bruises/bleeds easily.  Psychiatric/Behavioral: Positive for depression. Negative for hallucinations, memory loss, substance abuse and suicidal ideas. The patient is nervous/anxious. The patient does not have insomnia.     All other ROS negative except what is listed above and in the HPI.      Objective:    BP (!) 144/88    Pulse 62    Temp 98.6 F (37 C)    SpO2 100%   Wt Readings from Last 3 Encounters:  04/15/19 136 lb 3.2 oz (61.8 kg)  02/24/19 134 lb 2 oz (60.8 kg)  01/30/19 (P) 136 lb 9.2 oz (62 kg)    Physical Exam Vitals signs and nursing note reviewed.  Constitutional:      General: She is not in acute distress.    Appearance: Normal appearance. She is not ill-appearing, toxic-appearing or diaphoretic.  HENT:     Head: Normocephalic and atraumatic.     Right Ear: Tympanic membrane, ear canal and external ear normal. There is no impacted cerumen.     Left Ear: Tympanic membrane, ear canal and external ear normal. There is no impacted cerumen.     Nose: Nose normal. No congestion or rhinorrhea.     Mouth/Throat:     Mouth: Mucous membranes are moist.     Pharynx: Oropharynx is clear. No oropharyngeal exudate or posterior oropharyngeal erythema.  Eyes:     General: No scleral icterus.       Right eye: No discharge.         Left eye: No discharge.     Extraocular Movements: Extraocular movements intact.     Conjunctiva/sclera: Conjunctivae normal.     Pupils: Pupils are equal, round, and reactive to light.  Neck:     Musculoskeletal: Normal range of motion and neck supple. No neck rigidity or muscular tenderness.     Vascular: No carotid bruit.  Cardiovascular:     Rate and Rhythm: Normal rate and regular rhythm.     Pulses: Normal pulses.     Heart sounds: No murmur. No friction rub. No gallop.   Pulmonary:     Effort: Pulmonary effort is normal. No respiratory distress.     Breath sounds: Normal breath sounds. No stridor. No wheezing, rhonchi or rales.  Chest:     Chest wall: No tenderness.  Abdominal:  General: Abdomen is flat. Bowel sounds are normal. There is no distension.     Palpations: Abdomen is soft. There is no mass.     Tenderness: There is no abdominal tenderness. There is no right CVA tenderness, left CVA tenderness, guarding or rebound.     Hernia: No hernia is present.  Genitourinary:    Comments: Breast and pelvic exams deferred with shared decision making Musculoskeletal:        General: No swelling, tenderness, deformity or signs of injury.     Right lower leg: No edema.     Left lower leg: No edema.  Lymphadenopathy:     Cervical: No cervical adenopathy.  Skin:    General: Skin is warm and dry.     Capillary Refill: Capillary refill takes less than 2 seconds.     Coloration: Skin is not jaundiced or pale.     Findings: No bruising, erythema, lesion or rash.  Neurological:     General: No focal deficit present.     Mental Status: She is alert and oriented to person, place, and time. Mental status is at baseline.     Cranial Nerves: No cranial nerve deficit.     Sensory: No sensory deficit.     Motor: No weakness.     Coordination: Coordination normal.     Gait: Gait normal.     Deep Tendon Reflexes: Reflexes normal.  Psychiatric:        Mood and Affect: Mood normal.          Behavior: Behavior normal.        Thought Content: Thought content normal.        Judgment: Judgment normal.     Results for orders placed or performed in visit on 04/24/19  CBC with Differential/Platelet  Result Value Ref Range   WBC 4.4 3.4 - 10.8 x10E3/uL   RBC 4.10 3.77 - 5.28 x10E6/uL   Hemoglobin 12.7 11.1 - 15.9 g/dL   Hematocrit 38.7 34.0 - 46.6 %   MCV 94 79 - 97 fL   MCH 31.0 26.6 - 33.0 pg   MCHC 32.8 31.5 - 35.7 g/dL   RDW 12.8 11.7 - 15.4 %   Platelets 191 150 - 450 x10E3/uL   Neutrophils 62 Not Estab. %   Lymphs 26 Not Estab. %   Monocytes 9 Not Estab. %   Eos 3 Not Estab. %   Basos 0 Not Estab. %   Neutrophils Absolute 2.7 1.4 - 7.0 x10E3/uL   Lymphocytes Absolute 1.1 0.7 - 3.1 x10E3/uL   Monocytes Absolute 0.4 0.1 - 0.9 x10E3/uL   EOS (ABSOLUTE) 0.1 0.0 - 0.4 x10E3/uL   Basophils Absolute 0.0 0.0 - 0.2 x10E3/uL   Immature Granulocytes 0 Not Estab. %   Immature Grans (Abs) 0.0 0.0 - 0.1 x10E3/uL  Comprehensive metabolic panel  Result Value Ref Range   Glucose 99 65 - 99 mg/dL   BUN 14 8 - 27 mg/dL   Creatinine, Ser 0.75 0.57 - 1.00 mg/dL   GFR calc non Af Amer 80 >59 mL/min/1.73   GFR calc Af Amer 93 >59 mL/min/1.73   BUN/Creatinine Ratio 19 12 - 28   Sodium 143 134 - 144 mmol/L   Potassium 4.7 3.5 - 5.2 mmol/L   Chloride 102 96 - 106 mmol/L   CO2 26 20 - 29 mmol/L   Calcium 9.7 8.7 - 10.3 mg/dL   Total Protein 6.5 6.0 - 8.5 g/dL   Albumin 4.5 3.7 - 4.7 g/dL  Globulin, Total 2.0 1.5 - 4.5 g/dL   Albumin/Globulin Ratio 2.3 (H) 1.2 - 2.2   Bilirubin Total 0.3 0.0 - 1.2 mg/dL   Alkaline Phosphatase 76 39 - 117 IU/L   AST 25 0 - 40 IU/L   ALT 16 0 - 32 IU/L  Lipid Panel w/o Chol/HDL Ratio  Result Value Ref Range   Cholesterol, Total 132 100 - 199 mg/dL   Triglycerides 101 0 - 149 mg/dL   HDL 54 >39 mg/dL   VLDL Cholesterol Cal 20 5 - 40 mg/dL   LDL Calculated 58 0 - 99 mg/dL  Microalbumin, Urine Waived  Result Value Ref Range   Microalb, Ur  Waived 10 0 - 19 mg/L   Creatinine, Urine Waived 10 10 - 300 mg/dL   Microalb/Creat Ratio <30 <30 mg/g  TSH  Result Value Ref Range   TSH 2.170 0.450 - 4.500 uIU/mL  UA/M w/rflx Culture, Routine   Specimen: Urine   URINE  Result Value Ref Range   Specific Gravity, UA <1.005 (L) 1.005 - 1.030   pH, UA 5.5 5.0 - 7.5   Color, UA Yellow Yellow   Appearance Ur Clear Clear   Leukocytes,UA Negative Negative   Protein,UA Negative Negative/Trace   Glucose, UA Negative Negative   Ketones, UA Negative Negative   RBC, UA Negative Negative   Bilirubin, UA Negative Negative   Urobilinogen, Ur 0.2 0.2 - 1.0 mg/dL   Nitrite, UA Negative Negative  Uric acid  Result Value Ref Range   Uric Acid 7.1 2.5 - 7.1 mg/dL      Assessment & Plan:   Problem List Items Addressed This Visit      Cardiovascular and Mediastinum   Peripheral vascular disease (HCC)    Will keep BP and cholesterol under good control. Continue to monitor. Call with any concerns.       Relevant Orders   CBC with Differential/Platelet (Completed)   Comprehensive metabolic panel (Completed)   UA/M w/rflx Culture, Routine (Completed)   CAD (coronary artery disease)    Will keep BP and cholesterol under good control. Continue to monitor. Call with any concerns.       Senile purpura ([place])    Reassured patient. Call with any concerns.       Relevant Orders   Comprehensive metabolic panel (Completed)   UA/M w/rflx Culture, Routine (Completed)     Digestive   GERD (gastroesophageal reflux disease)    Under good control on current regimen. Continue current regimen. Continue to monitor. Call with any concerns. Refills given. Labs drawn today.        Relevant Medications   clidinium-chlordiazePOXIDE (LIBRAX) 5-2.5 MG capsule     Genitourinary   Benign hypertensive renal disease    Under good control on current regimen. Continue current regimen. Continue to monitor. Call with any concerns. Refills given. Labs drawn  today.       Relevant Orders   CBC with Differential/Platelet (Completed)   Comprehensive metabolic panel (Completed)   Microalbumin, Urine Waived (Completed)   TSH (Completed)   UA/M w/rflx Culture, Routine (Completed)     Other   Hyperlipidemia    Under good control on current regimen. Continue current regimen. Continue to monitor. Call with any concerns. Refills given. Labs drawn today.      Relevant Orders   Comprehensive metabolic panel (Completed)   Lipid Panel w/o Chol/HDL Ratio (Completed)   UA/M w/rflx Culture, Routine (Completed)   Gout    Under good control on  current regimen. Continue current regimen. Continue to monitor. Call with any concerns. Refills given. Labs drawn today.      Relevant Orders   Comprehensive metabolic panel (Completed)   UA/M w/rflx Culture, Routine (Completed)   Uric acid (Completed)   Acute anxiety    Under good control on current regimen. Continue current regimen. Continue to monitor. Call with any concerns. Refills given. Labs drawn today.      Relevant Orders   Comprehensive metabolic panel (Completed)   TSH (Completed)   UA/M w/rflx Culture, Routine (Completed)   Malignant neoplasm of overlapping sites of left female breast ([place])    Continue to follow with Dr. Bary Castilla. Call with any concerns.       Relevant Orders   CBC with Differential/Platelet (Completed)   Comprehensive metabolic panel (Completed)   UA/M w/rflx Culture, Routine (Completed)    Other Visit Diagnoses    Routine general medical examination at a health care facility    -  Primary   Vaccines up to date. Screening labs checked today. Pap N/A. Mammogram and DEXA up to date. Colonoscopy scheduled, Call with any concerns. Conitnue to monitor.   Immunization due       Flu shot given today.   Relevant Orders   Flu Vaccine QUAD High Dose(Fluad) (Completed)   Acquired bilateral hammer toes       Referral to podiatry made today.   Relevant Orders   Ambulatory  referral to Podiatry       Follow up plan: Return End of Nov/early Dec Follow up.   LABORATORY TESTING:  - Pap smear: not applicable  IMMUNIZATIONS:   - Tdap: Tetanus vaccination status reviewed: last tetanus booster within 10 years. - Influenza: Administered today - Pneumovax: Up to date - Prevnar: Up to date  SCREENING: -Mammogram: Up to date  - Colonoscopy: Scheduled with Dr. Bary Castilla  - Bone Density: Up to date   PATIENT COUNSELING:   Advised to take 1 mg of folate supplement per day if capable of pregnancy.   Sexuality: Discussed sexually transmitted diseases, partner selection, use of condoms, avoidance of unintended pregnancy  and contraceptive alternatives.   Advised to avoid cigarette smoking.  I discussed with the patient that most people either abstain from alcohol or drink within safe limits (<=14/week and <=4 drinks/occasion for males, <=7/weeks and <= 3 drinks/occasion for females) and that the risk for alcohol disorders and other health effects rises proportionally with the number of drinks per week and how often a drinker exceeds daily limits.  Discussed cessation/primary prevention of drug use and availability of treatment for abuse.   Diet: Encouraged to adjust caloric intake to maintain  or achieve ideal body weight, to reduce intake of dietary saturated fat and total fat, to limit sodium intake by avoiding high sodium foods and not adding table salt, and to maintain adequate dietary potassium and calcium preferably from fresh fruits, vegetables, and low-fat dairy products.    stressed the importance of regular exercise  Injury prevention: Discussed safety belts, safety helmets, smoke detector, smoking near bedding or upholstery.   Dental health: Discussed importance of regular tooth brushing, flossing, and dental visits.    NEXT PREVENTATIVE PHYSICAL DUE IN 1 YEAR. Return End of Nov/early Dec Follow up.

## 2019-04-25 LAB — COMPREHENSIVE METABOLIC PANEL
ALT: 16 IU/L (ref 0–32)
AST: 25 IU/L (ref 0–40)
Albumin/Globulin Ratio: 2.3 — ABNORMAL HIGH (ref 1.2–2.2)
Albumin: 4.5 g/dL (ref 3.7–4.7)
Alkaline Phosphatase: 76 IU/L (ref 39–117)
BUN/Creatinine Ratio: 19 (ref 12–28)
BUN: 14 mg/dL (ref 8–27)
Bilirubin Total: 0.3 mg/dL (ref 0.0–1.2)
CO2: 26 mmol/L (ref 20–29)
Calcium: 9.7 mg/dL (ref 8.7–10.3)
Chloride: 102 mmol/L (ref 96–106)
Creatinine, Ser: 0.75 mg/dL (ref 0.57–1.00)
GFR calc Af Amer: 93 mL/min/{1.73_m2} (ref >59)
GFR calc non Af Amer: 80 mL/min/{1.73_m2} (ref >59)
Globulin, Total: 2 g/dL (ref 1.5–4.5)
Glucose: 99 mg/dL (ref 65–99)
Potassium: 4.7 mmol/L (ref 3.5–5.2)
Sodium: 143 mmol/L (ref 134–144)
Total Protein: 6.5 g/dL (ref 6.0–8.5)

## 2019-04-25 LAB — LIPID PANEL W/O CHOL/HDL RATIO
Cholesterol, Total: 132 mg/dL (ref 100–199)
HDL: 54 mg/dL (ref >39)
LDL Calculated: 58 mg/dL (ref 0–99)
Triglycerides: 101 mg/dL (ref 0–149)
VLDL Cholesterol Cal: 20 mg/dL (ref 5–40)

## 2019-04-25 LAB — CBC WITH DIFFERENTIAL/PLATELET
Basophils Absolute: 0 10*3/uL (ref 0.0–0.2)
Basos: 0 %
EOS (ABSOLUTE): 0.1 10*3/uL (ref 0.0–0.4)
Eos: 3 %
Hematocrit: 38.7 % (ref 34.0–46.6)
Hemoglobin: 12.7 g/dL (ref 11.1–15.9)
Immature Grans (Abs): 0 10*3/uL (ref 0.0–0.1)
Immature Granulocytes: 0 %
Lymphocytes Absolute: 1.1 10*3/uL (ref 0.7–3.1)
Lymphs: 26 %
MCH: 31 pg (ref 26.6–33.0)
MCHC: 32.8 g/dL (ref 31.5–35.7)
MCV: 94 fL (ref 79–97)
Monocytes Absolute: 0.4 10*3/uL (ref 0.1–0.9)
Monocytes: 9 %
Neutrophils Absolute: 2.7 10*3/uL (ref 1.4–7.0)
Neutrophils: 62 %
Platelets: 191 10*3/uL (ref 150–450)
RBC: 4.1 x10E6/uL (ref 3.77–5.28)
RDW: 12.8 % (ref 11.7–15.4)
WBC: 4.4 10*3/uL (ref 3.4–10.8)

## 2019-04-25 LAB — URIC ACID: Uric Acid: 7.1 mg/dL (ref 2.5–7.1)

## 2019-04-25 LAB — TSH: TSH: 2.17 u[IU]/mL (ref 0.450–4.500)

## 2019-04-26 NOTE — Assessment & Plan Note (Signed)
Under good control on current regimen. Continue current regimen. Continue to monitor. Call with any concerns. Refills given. Labs drawn today.   

## 2019-04-26 NOTE — Assessment & Plan Note (Signed)
Continue to follow with Dr. Byrnett. Call with any concerns.  

## 2019-04-26 NOTE — Assessment & Plan Note (Signed)
Will keep BP and cholesterol under good control. Continue to monitor. Call with any concerns.  

## 2019-04-26 NOTE — Assessment & Plan Note (Signed)
Reassured patient. Call with any concerns.  

## 2019-05-06 ENCOUNTER — Ambulatory Visit: Payer: Self-pay | Admitting: *Deleted

## 2019-05-06 ENCOUNTER — Telehealth: Payer: Self-pay

## 2019-05-06 DIAGNOSIS — Z636 Dependent relative needing care at home: Secondary | ICD-10-CM

## 2019-05-06 NOTE — Chronic Care Management (AMB) (Signed)
  Chronic Care Management   Outreach Note  05/06/2019 Name: Marie Parker MRN: [id number] DOB: 04-Jun-1946  Referred by: Valerie Roys, DO Reason for referral : Chronic Care Management (Unsucessful Outreach x1)   An unsuccessful telephone outreach was attempted today. The patient was referred to the case management team by for assistance with chronic care management and care coordination.   Follow Up Plan: A HIPPA compliant phone message was left for the patient providing contact information and requesting a return call.   [name] Marie Rada RN, BSN Nurse Case Editor, commissioning [facility]  [phone]) Business Mobile

## 2019-05-19 ENCOUNTER — Ambulatory Visit (INDEPENDENT_AMBULATORY_CARE_PROVIDER_SITE_OTHER): Payer: Medicare Other | Admitting: Licensed Clinical Social Worker

## 2019-05-19 DIAGNOSIS — I2510 Atherosclerotic heart disease of native coronary artery without angina pectoris: Secondary | ICD-10-CM

## 2019-05-19 DIAGNOSIS — F419 Anxiety disorder, unspecified: Secondary | ICD-10-CM

## 2019-05-19 NOTE — Chronic Care Management (AMB) (Signed)
Chronic Care Management    Clinical Social Work Follow Up Note  05/19/2019 Name: Marie Parker MRN: [id number] DOB: 11/26/1945  Marie Parker is a 73 y.o. year old female who is a primary care patient of Valerie Roys, DO. The CCM team was consulted for assistance with Mental Health Counseling and Resources.   Review of patient status, including review of consultants reports, other relevant assessments, and collaboration with appropriate care team members and the patient's provider was performed as part of comprehensive patient evaluation and provision of chronic care management services.    SDOH (Social Determinants of Health) screening performed today: Depression  . See Care Plan for related entries.   Outpatient Encounter Medications as of 05/19/2019  Medication Sig  . acetaminophen (TYLENOL) 650 MG CR tablet Take 1,950 mg by mouth daily as needed for pain.  [name] amLODipine (NORVASC) 10 MG tablet Take 1 tablet (10 mg total) by mouth daily.  [name] aspirin 81 MG tablet Take 1 tablet (81 mg total) by mouth daily. (Patient taking differently: Take 81 mg by mouth at bedtime. )  . atenolol (TENORMIN) 50 MG tablet Take 1 tablet (50 mg total) by mouth daily.  [name] atorvastatin (LIPITOR) 40 MG tablet Take 1 tablet (40 mg total) by mouth daily.  . benazepril (LOTENSIN) 40 MG tablet Take 1 tablet (40 mg total) by mouth daily.  [name] CALCIUM PO Take by mouth.  . clidinium-chlordiazePOXIDE (LIBRAX) 5-2.5 MG capsule TAKE 1 CAPSULE BY MOUTH TWICE DAILY FOR  LUNCH  AND  DINNER  . clonazePAM (KLONOPIN) 0.5 MG tablet Take 0.5-1 tablets (0.25-0.5 mg total) by mouth 2 (two) times daily as needed for anxiety.  . diphenhydrAMINE (BENADRYL) 25 mg capsule Take 25 mg by mouth at bedtime.   . fluticasone (FLONASE) 50 MCG/ACT nasal spray USE 2 SPRAYS IN EACH  NOSTRIL DAILY  . hydrALAZINE (APRESOLINE) 25 MG tablet Take 1 tablet (25 mg total) by mouth 2 (two) times daily.  [name] letrozole (FEMARA) 2.5 MG tablet Take 1 tablet  (2.5 mg total) by mouth daily.  . niacin 500 MG tablet Take 1,000 mg by mouth at bedtime.  . Olopatadine HCl 0.2 % SOLN Place 1 drop into the left eye daily.  . pantoprazole (PROTONIX) 40 MG tablet Take 1 tablet (40 mg total) by mouth daily.  . Probiotic Product (PROBIOTIC-10 PO) Take by mouth daily.  . sertraline (ZOLOFT) 100 MG tablet Take 1 tablet (100 mg total) by mouth daily.  [name] tiZANidine (ZANAFLEX) 4 MG tablet TAKE 1 TABLET(4 MG) BY MOUTH EVERY 6 HOURS AS NEEDED FOR MUSCLE SPASMS  . Turmeric 500 MG TABS Take 500 mg by mouth daily.   No facility-administered encounter medications on file as of 05/19/2019.      Goals Addressed    . SW-I want to improve my self-care and decrease my anxiety (pt-stated)       Current Barriers:  . Financial constraints . Limited social support . Level of care concerns . Mental Health Concerns  . Social Isolation . Limited access to caregiver . Lacks knowledge of community resource: available personal care service resources within the area  Clinical Social Work Clinical Goal(s):  [name] Over the next 90 days, client will work with SW to address concerns related to reducing anxiety and improving self-care  Interventions: . Patient interviewed and appropriate assessments performed . Provided mental health counseling with regard to managing anxiety. Educated patient on coping methods to implement into his daily life to combat depressive symptoms and stress.  Patient denied any suicidal or homicidal ideations. Encouraged patient to implement deep breathing exercises into her daily routine due to ongoing anxiety. Patient reports having therapy in the past but denies wanting LCSW to make a referral for counseling. Patient was appreciative of the offer but wishes to manage her care without mental health therapy at this time. [name] UPDATE- Patient reports that since her Zoloft medication was increased by 50 mg, she is able to manage her depressive symptoms a lot more  effectively.  . Provided patient with information about available personal care service resources within the area. Pt reports that spouse is doing better and they would like to see how he does without help for now before they agree to a referral to [facility].  . CSW used motivational interviewing techniques to engage pt in meaningful conversation and to assess pt's wilingess to improve self-care and decrease her caregiver burnout symptoms. CSW explored pt's coping skills and access to community services.  [name]  LCSW provided reflective listening and implemented appropriate interventions to help suppport patient and her emotional needs  . Patient was receptive to emotional support provided during session . Discussed plans with patient for ongoing care management follow up and provided patient with direct contact information for care management team . Advised patient to contact CCM program for any urgent needs. . Assisted patient/caregiver with obtaining information about health plan benefits . Provided education to patient/caregiver regarding level of care options.  Patient Self Care Activities:  . Attends all scheduled provider appointments . Calls provider office for new concerns or questions  Please see past updates related to this goal by clicking on the "Past Updates" button in the selected goal      Follow Up Plan: SW will follow up with patient by phone over the next 45 days  Marie Parker, [place], MSW, [email or web address] Phone: [phone]

## 2019-05-20 ENCOUNTER — Telehealth: Payer: Self-pay

## 2019-05-21 ENCOUNTER — Other Ambulatory Visit: Payer: Self-pay | Admitting: *Deleted

## 2019-05-21 ENCOUNTER — Telehealth: Payer: Self-pay

## 2019-05-21 MED ORDER — LETROZOLE 2.5 MG PO TABS: 2.5000 mg | ORAL_TABLET | Freq: Every day | ORAL | 12 refills | Status: DC

## 2019-05-21 NOTE — Progress Notes (Signed)
l °

## 2019-05-21 NOTE — Telephone Encounter (Signed)
Request for refill on Letrozole from [facility]. Per Dr.Piscoya please refer to Oncology. Request faxed to Dr.Glenn Chrystal at this time, patient notified and asked to call Dr.Chyrstal office later today to check status.

## 2019-05-28 ENCOUNTER — Telehealth: Payer: Self-pay

## 2019-06-20 ENCOUNTER — Other Ambulatory Visit: Payer: Self-pay | Admitting: Family Medicine

## 2019-06-20 NOTE — Telephone Encounter (Signed)
Forwarding medication refill requests to PCP for review. 

## 2019-07-01 ENCOUNTER — Telehealth: Payer: Self-pay

## 2019-07-01 ENCOUNTER — Ambulatory Visit: Payer: Self-pay | Admitting: Licensed Clinical Social Worker

## 2019-07-01 NOTE — Chronic Care Management (AMB) (Signed)
  Care Management   Follow Up Note   07/01/2019 Name: Kenslei Reap MRN: [id number] DOB: 08/19/1946  Referred by: Valerie Roys, DO Reason for referral : Care Coordination   Noell Zebley is a 73 y.o. year old female who is a primary care patient of Valerie Roys, DO. The care management team was consulted for assistance with care management and care coordination needs.    Review of patient status, including review of consultants reports, relevant laboratory and other test results, and collaboration with appropriate care team members and the patient's provider was performed as part of comprehensive patient evaluation and provision of chronic care management services.    LCSW completed CCM outreach attempt today but was unable to reach patient successfully. A HIPPA compliant voice message was left encouraging patient to return call once available. LCSW rescheduled CCM SW appointment as well.  A HIPPA compliant phone message was left for the patient providing contact information and requesting a return call.   Eula Fried, BSW, MSW, [facility] Care Management [email or web address] Phone: [phone]

## 2019-07-08 ENCOUNTER — Ambulatory Visit: Payer: Self-pay | Admitting: *Deleted

## 2019-07-08 DIAGNOSIS — Z636 Dependent relative needing care at home: Secondary | ICD-10-CM

## 2019-07-08 DIAGNOSIS — F419 Anxiety disorder, unspecified: Secondary | ICD-10-CM

## 2019-07-08 NOTE — Chronic Care Management (AMB) (Signed)
Chronic Care Management   Follow Up Note   07/08/2019 Name: Marie Parker MRN: [id number] DOB: March 13, 1946  Referred by: Valerie Roys, DO Reason for referral : Chronic Care Management (Care-giver stress)   Marie Parker is a 72 y.o. year old female who is a primary care patient of Valerie Roys, DO. The CCM team was consulted for assistance with chronic disease management and care coordination needs.    Review of patient status, including review of consultants reports, relevant laboratory and other test results, and collaboration with appropriate care team members and the patient's provider was performed as part of comprehensive patient evaluation and provision of chronic care management services.    SDOH (Social Determinants of Health) screening performed today: Stress. See Care Plan for related entries.   Outpatient Encounter Medications as of 07/08/2019  Medication Sig  . acetaminophen (TYLENOL) 650 MG CR tablet Take 1,950 mg by mouth daily as needed for pain.  [name] amLODipine (NORVASC) 10 MG tablet Take 1 tablet (10 mg total) by mouth daily.  [name] aspirin 81 MG tablet Take 1 tablet (81 mg total) by mouth daily. (Patient taking differently: Take 81 mg by mouth at bedtime. )  . atenolol (TENORMIN) 50 MG tablet TAKE 1 TABLET BY MOUTH  DAILY  . atorvastatin (LIPITOR) 40 MG tablet TAKE 1 TABLET BY MOUTH  DAILY  . benazepril (LOTENSIN) 40 MG tablet TAKE 1 TABLET BY MOUTH  DAILY  . CALCIUM PO Take by mouth.  . clidinium-chlordiazePOXIDE (LIBRAX) 5-2.5 MG capsule TAKE 1 CAPSULE BY MOUTH TWICE DAILY FOR  LUNCH  AND  DINNER  . clonazePAM (KLONOPIN) 0.5 MG tablet Take 0.5-1 tablets (0.25-0.5 mg total) by mouth 2 (two) times daily as needed for anxiety.  . diphenhydrAMINE (BENADRYL) 25 mg capsule Take 25 mg by mouth at bedtime.   . fluticasone (FLONASE) 50 MCG/ACT nasal spray USE 2 SPRAYS IN EACH  NOSTRIL DAILY  . hydrALAZINE (APRESOLINE) 25 MG tablet Take 1 tablet (25 mg total) by mouth  2 (two) times daily.  [name] letrozole (FEMARA) 2.5 MG tablet Take 1 tablet (2.5 mg total) by mouth daily.  . niacin 500 MG tablet Take 1,000 mg by mouth at bedtime.  . Olopatadine HCl 0.2 % SOLN Place 1 drop into the left eye daily.  . pantoprazole (PROTONIX) 40 MG tablet Take 1 tablet (40 mg total) by mouth daily.  . Probiotic Product (PROBIOTIC-10 PO) Take by mouth daily.  . sertraline (ZOLOFT) 100 MG tablet TAKE 1 TABLET BY MOUTH  DAILY  . tiZANidine (ZANAFLEX) 4 MG tablet TAKE 1 TABLET(4 MG) BY MOUTH EVERY 6 HOURS AS NEEDED FOR MUSCLE SPASMS  . Turmeric 500 MG TABS Take 500 mg by mouth daily.   No facility-administered encounter medications on file as of 07/08/2019.      Goals Addressed            This Visit's Progress   . RNCM- I am working towards decreasing my anxiety (pt-stated)       Current Barriers:  [name] Knowledge Deficits related to caregiver strain and reducing daily anxiety . Lacks caregiver support.   Nurse Case Manager Clinical Goal(s):  [name] Over the next 90 days, patient will work with [facility] to address needs related to reducing caregiver strian  Interventions:  . Evaluation of current treatment plan related to anxiety/stress and patient's adherence to plan as established by provider. . Advised patient to think about incorporating exercise into her daily routine, a healthy diet, and taking breaks and  time for herself. 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  . Reviewed medications with patient and discussed increase in zoloft has been very helpful . Discussed plans with patient for ongoing care management follow up and provided patient with direct contact information for care management team . Provided patient with mailed written educational materials related to helping her spouse with adding calories to his diet and ensure coupons. . Reviewed with patient how she is handling stress/anxiety. Patient  stating she has decreased some of her outside activities to assist her spouse more which has relieved some of her time commitments.   Patient Self Care Activities:  . Currently UNABLE TO independently manage stress and anxiety, self reported . Performs ADL's independently . Performs IADL's independently  Please see past updates related to this goal by clicking on the "Past Updates" button in the selected goal          The care management team will reach out to the patient again over the next 30 days.  The patient has been provided with contact information for the care management team and has been advised to call with any health related questions or concerns.    [name] Jiovanni Heeter RN, BSN Nurse Case Editor, [facility]  [phone]) Business Mobile

## 2019-07-08 NOTE — Patient Instructions (Addendum)
Thank you allowing the Chronic Care Management Team to be a part of your care! It was a pleasure speaking with you today!  CCM (Chronic Care Management) Team   Zoei Amison RN, BSN Nurse Care Coordinator  [phone]  [facility] PharmD  Clinical Pharmacist  [phone]  Eula Fried LCSW Clinical Social Worker [phone]  Goals Addressed            This Visit's Progress   . RNCM- I am working towards decreasing my anxiety (pt-stated)       Current Barriers:  [name] Knowledge Deficits related to caregiver strain and reducing daily anxiety . Lacks caregiver support.   Nurse Case Manager Clinical Goal(s):  [name] Over the next 90 days, patient will work with [facility] to address needs related to reducing caregiver strian  Interventions:  . Evaluation of current treatment plan related to anxiety/stress and patient's adherence to plan as established by provider. . Advised patient to think about incorporating exercise into her daily routine, a healthy diet, and taking breaks and time for herself. 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  . Reviewed medications with patient and discussed increase in zoloft has been very helpful . Discussed plans with patient for ongoing care management follow up and provided patient with direct contact information for care management team . Provided patient with mailed written educational materials related to helping her spouse with adding calories to his diet and ensure coupons. . Reviewed with patient how she is handling stress/anxiety. Patient stating she has decreased some of her outside activities to assist her spouse more which has relieved some of her time commitments.   Patient Self Care Activities:  . Currently UNABLE TO independently manage stress and anxiety, self reported . Performs ADL's independently . Performs IADL's independently  Please see past updates related to  this goal by clicking on the "Past Updates" button in the selected goal         The patient verbalized understanding of instructions provided today and declined a print copy of patient instruction materials.   The patient has been provided with contact information for the care management team and has been advised to call with any health related questions or concerns.    Teaching Sheet to assist patient with caring for her spouse's calorie needs   High-Protein and High-Calorie Diet Eating high-protein and high-calorie foods can help you to gain weight, heal after an injury, and recover after an illness or surgery. The specific amount of daily protein and calories you need depends on:  Your body weight.  The reason this diet is recommended for you. What is my plan? Generally, a high-protein, high-calorie diet involves:  Eating 250-500 extra calories each day.  Making sure that you get enough of your daily calories from protein. Ask your health care provider how many of your calories should come from protein. Talk with a health care provider, such as a diet and nutrition specialist (dietitian), about how much protein and how many calories you need each day. Follow the diet as directed by your health care provider. What are tips for following this plan?  Preparing meals  Add whole milk, half-and-half, or heavy cream to cereal, pudding, soup, or hot cocoa.  Add whole milk to instant breakfast drinks.  Add peanut butter to oatmeal or smoothies.  Add powdered milk to baked goods, smoothies, or milkshakes.  Add powdered milk, cream,  or butter to mashed potatoes.  Add cheese to cooked vegetables.  Make whole-milk yogurt parfaits. Top them with granola, fruit, or nuts.  Add cottage cheese to your fruit.  Add avocado, cheese, or both to sandwiches or salads.  Add meat, poultry, or seafood to rice, pasta, casseroles, salads, and soups.  Use mayonnaise when making egg salad,  chicken salad, or tuna salad.  Use peanut butter as a dip for vegetables or as a topping for pretzels, celery, or crackers.  Add beans to casseroles, dips, and spreads.  Add pureed beans to sauces and soups.  Replace calorie-free drinks with calorie-containing drinks, such as milk and fruit juice.  Replace water with milk or heavy cream when making foods such as oatmeal, pudding, or cocoa. General instructions  Ask your health care provider if you should take a nutritional supplement.  Try to eat six small meals each day instead of three large meals.  Eat a balanced diet. In each meal, include one food that is high in protein.  Keep nutritious snacks available, such as nuts, trail mixes, dried fruit, and yogurt.  If you have kidney disease or diabetes, talk with your health care provider about how much protein is safe for you. Too much protein may put extra stress on your kidneys.  Drink your calories. Choose high-calorie drinks and have them after your meals. What high-protein foods should I eat?  Vegetables Soybeans. Peas. Grains Quinoa. Bulgur wheat. Meats and other proteins Beef, pork, and poultry. Fish and seafood. Eggs. Tofu. Textured vegetable protein (TVP). Peanut butter. Nuts and seeds. Dried beans. Protein powders. Dairy Whole milk. Whole-milk yogurt. Powdered milk. Cheese. [employer]. Eggnog. Beverages High-protein supplement drinks. Soy milk. Other foods Protein bars. The items listed above may not be a complete list of high-protein foods and beverages. Contact a dietitian for more options. What high-calorie foods should I eat? Fruits Dried fruit. Fruit leather. Canned fruit in syrup. Fruit juice. Avocado. Vegetables Vegetables cooked in oil or butter. Fried potatoes. Grains Pasta. Quick breads. Muffins. Pancakes. Ready-to-eat cereal. Meats and other proteins Peanut butter. Nuts and seeds. Dairy Heavy cream. Whipped cream. Cream cheese. Sour cream. Ice  cream. Custard. Pudding. Beverages Meal-replacement beverages. Nutrition shakes. Fruit juice. Sugar-sweetened soft drinks. Seasonings and condiments Salad dressing. Mayonnaise. Alfredo sauce. Fruit preserves or jelly. Honey. Syrup. Sweets and desserts Cake. Cookies. Pie. Pastries. Candy bars. Chocolate. Fats and oils Butter or margarine. Oil. Gravy. Other foods Meal-replacement bars. The items listed above may not be a complete list of high-calorie foods and beverages. Contact a dietitian for more options. Summary  A high-protein, high-calorie diet can help you gain weight or heal faster after an injury, illness, or surgery.  To increase your protein and calories, add ingredients such as whole milk, peanut butter, cheese, beans, meat, or seafood to meal items.  To get enough extra calories each day, include high-calorie foods and beverages at each meal.  Adding a high-calorie drink or shake can be an easy way to help you get enough calories each day. Talk with your healthcare provider or dietitian about the best options for you. This information is not intended to replace advice given to you by your health care provider. Make sure you discuss any questions you have with your health care provider. Document Released: 08/14/2005 Document Revised: 07/27/2017 Document Reviewed: 06/26/2017 Elsevier Patient Education  2020 [employer].

## 2019-07-31 ENCOUNTER — Other Ambulatory Visit: Payer: Self-pay

## 2019-07-31 ENCOUNTER — Ambulatory Visit (INDEPENDENT_AMBULATORY_CARE_PROVIDER_SITE_OTHER): Payer: Medicare Other | Admitting: Family Medicine

## 2019-07-31 ENCOUNTER — Encounter: Payer: Self-pay | Admitting: Family Medicine

## 2019-07-31 DIAGNOSIS — F419 Anxiety disorder, unspecified: Secondary | ICD-10-CM | POA: Diagnosis not present

## 2019-07-31 NOTE — Progress Notes (Signed)
BP 126/64   Pulse 67   Temp 98 F (36.7 C)   Wt 133 lb 2 oz (60.4 kg)   SpO2 97%   BMI 26.44 kg/m    Subjective:    Patient ID: Marie Parker, female    DOB: 10/05/1945, 73 y.o.   MRN: [id number]  HPI: Marie Parker is a 73 y.o. female  Chief Complaint  Patient presents with  . Anxiety   ANXIETY/STRESS- still caring for her husband, needing to take her klonopin 1-2x a month Duration:stable Anxious mood: yes  Excessive worrying: yes Irritability: no  Sweating: no Nausea: no Palpitations:no Hyperventilation: no Panic attacks: no Agoraphobia: no  Obscessions/compulsions: no Depressed mood: no Depression screen [facility] 2/9 07/31/2019 04/24/2019 02/24/2019 01/01/2019 05/24/2018  Decreased Interest 0 0 0 0 0  Down, Depressed, Hopeless 1 0 0 0 1  PHQ - 2 Score 1 0 0 0 1  Altered sleeping 0 0 0 - 0  Tired, decreased energy 0 1 0 - 0  Change in appetite 0 0 0 - 0  Feeling bad or failure about yourself  0 0 0 - 0  Trouble concentrating 0 0 0 - 0  Moving slowly or fidgety/restless 0 0 0 - 0  Suicidal thoughts 0 0 0 - 0  PHQ-9 Score 1 1 0 - 1  Difficult doing work/chores Not difficult at all Not difficult at all Not difficult at all - -   GAD 7 : Generalized Anxiety Score 07/31/2019 02/24/2019 05/24/2018 06/26/2017  Nervous, Anxious, on Edge 1 3 1  0  Control/stop worrying 0 1 1 0  Worry too much - different things 0 1 0 0  Trouble relaxing 0 2 0 0  Restless 0 2 0 0  Easily annoyed or irritable 1 1 0 0  Afraid - awful might happen 1 1 0 0  Total GAD 7 Score 3 11 2  0  Anxiety Difficulty Not difficult at all Not difficult at all - Not difficult at all   Anhedonia: no Weight changes: no Insomnia: no   Hypersomnia: no Fatigue/loss of energy: no Feelings of worthlessness: no Feelings of guilt: no Impaired concentration/indecisiveness: no Suicidal ideations: no  Crying spells: no Recent Stressors/Life Changes: yes   Relationship problems: no   Family stress: yes    Financial stress: no    Job stress: no    Recent death/loss: no  Relevant past medical, surgical, family and social history reviewed and updated as indicated. Interim medical history since our last visit reviewed. Allergies and medications reviewed and updated.  Review of Systems  Constitutional: Negative.   Respiratory: Negative.   Cardiovascular: Negative.   Musculoskeletal: Negative.   Skin: Negative.   Psychiatric/Behavioral: Negative for agitation, behavioral problems, confusion, decreased concentration, dysphoric mood, hallucinations, self-injury, sleep disturbance and suicidal ideas. The patient is nervous/anxious. The patient is not hyperactive.     Per HPI unless specifically indicated above     Objective:    BP 126/64   Pulse 67   Temp 98 F (36.7 C)   Wt 133 lb 2 oz (60.4 kg)   SpO2 97%   BMI 26.44 kg/m   Wt Readings from Last 3 Encounters:  07/31/19 133 lb 2 oz (60.4 kg)  04/15/19 136 lb 3.2 oz (61.8 kg)  02/24/19 134 lb 2 oz (60.8 kg)    Physical Exam Vitals signs and nursing note reviewed.  Constitutional:      General: She is not in acute distress.    Appearance: Normal  appearance. She is not ill-appearing, toxic-appearing or diaphoretic.  HENT:     Head: Normocephalic and atraumatic.     Right Ear: External ear normal.     Left Ear: External ear normal.     Nose: Nose normal.     Mouth/Throat:     Mouth: Mucous membranes are moist.     Pharynx: Oropharynx is clear.  Eyes:     General: No scleral icterus.       Right eye: No discharge.        Left eye: No discharge.     Conjunctiva/sclera: Conjunctivae normal.     Pupils: Pupils are equal, round, and reactive to light.  Neck:     Musculoskeletal: Normal range of motion.  Pulmonary:     Effort: Pulmonary effort is normal. No respiratory distress.     Comments: Speaking in full sentences Musculoskeletal: Normal range of motion.  Skin:    Coloration: Skin is not jaundiced or pale.     Findings:  No bruising, erythema, lesion or rash.  Neurological:     Mental Status: She is alert and oriented to person, place, and time. Mental status is at baseline.  Psychiatric:        Mood and Affect: Mood normal.        Behavior: Behavior normal.        Thought Content: Thought content normal.        Judgment: Judgment normal.     Results for orders placed or performed in visit on 04/24/19  CBC with Differential/Platelet  Result Value Ref Range   WBC 4.4 3.4 - 10.8 x10E3/uL   RBC 4.10 3.77 - 5.28 x10E6/uL   Hemoglobin 12.7 11.1 - 15.9 g/dL   Hematocrit 38.7 34.0 - 46.6 %   MCV 94 79 - 97 fL   MCH 31.0 26.6 - 33.0 pg   MCHC 32.8 31.5 - 35.7 g/dL   RDW 12.8 11.7 - 15.4 %   Platelets 191 150 - 450 x10E3/uL   Neutrophils 62 Not Estab. %   Lymphs 26 Not Estab. %   Monocytes 9 Not Estab. %   Eos 3 Not Estab. %   Basos 0 Not Estab. %   Neutrophils Absolute 2.7 1.4 - 7.0 x10E3/uL   Lymphocytes Absolute 1.1 0.7 - 3.1 x10E3/uL   Monocytes Absolute 0.4 0.1 - 0.9 x10E3/uL   EOS (ABSOLUTE) 0.1 0.0 - 0.4 x10E3/uL   Basophils Absolute 0.0 0.0 - 0.2 x10E3/uL   Immature Granulocytes 0 Not Estab. %   Immature Grans (Abs) 0.0 0.0 - 0.1 x10E3/uL  Comprehensive metabolic panel  Result Value Ref Range   Glucose 99 65 - 99 mg/dL   BUN 14 8 - 27 mg/dL   Creatinine, Ser 0.75 0.57 - 1.00 mg/dL   GFR calc non Af Amer 80 >59 mL/min/1.73   GFR calc Af Amer 93 >59 mL/min/1.73   BUN/Creatinine Ratio 19 12 - 28   Sodium 143 134 - 144 mmol/L   Potassium 4.7 3.5 - 5.2 mmol/L   Chloride 102 96 - 106 mmol/L   CO2 26 20 - 29 mmol/L   Calcium 9.7 8.7 - 10.3 mg/dL   Total Protein 6.5 6.0 - 8.5 g/dL   Albumin 4.5 3.7 - 4.7 g/dL   Globulin, Total 2.0 1.5 - 4.5 g/dL   Albumin/Globulin Ratio 2.3 (H) 1.2 - 2.2   Bilirubin Total 0.3 0.0 - 1.2 mg/dL   Alkaline Phosphatase 76 39 - 117 IU/L   AST 25 0 -  40 IU/L   ALT 16 0 - 32 IU/L  Lipid Panel w/o Chol/HDL Ratio  Result Value Ref Range   Cholesterol, Total 132  100 - 199 mg/dL   Triglycerides 101 0 - 149 mg/dL   HDL 54 >39 mg/dL   VLDL Cholesterol Cal 20 5 - 40 mg/dL   LDL Calculated 58 0 - 99 mg/dL  Microalbumin, Urine Waived  Result Value Ref Range   Microalb, Ur Waived 10 0 - 19 mg/L   Creatinine, Urine Waived 10 10 - 300 mg/dL   Microalb/Creat Ratio <30 <30 mg/g  TSH  Result Value Ref Range   TSH 2.170 0.450 - 4.500 uIU/mL  UA/M w/rflx Culture, Routine   Specimen: Urine   URINE  Result Value Ref Range   Specific Gravity, UA <1.005 (L) 1.005 - 1.030   pH, UA 5.5 5.0 - 7.5   Color, UA Yellow Yellow   Appearance Ur Clear Clear   Leukocytes,UA Negative Negative   Protein,UA Negative Negative/Trace   Glucose, UA Negative Negative   Ketones, UA Negative Negative   RBC, UA Negative Negative   Bilirubin, UA Negative Negative   Urobilinogen, Ur 0.2 0.2 - 1.0 mg/dL   Nitrite, UA Negative Negative  Uric acid  Result Value Ref Range   Uric Acid 7.1 2.5 - 7.1 mg/dL      Assessment & Plan:   Problem List Items Addressed This Visit      Other   Acute anxiety    Under good control on current regimen. Continue current regimen. Continue to monitor. Call with any concerns. Refills up to date, will call when she needs refill on her lorazepam.          Follow up plan: Return in about 3 months (around 10/29/2019) for 6 month follow up.    . This visit was completed via [employer] due to the restrictions of the COVID-19 pandemic. All issues as above were discussed and addressed. Physical exam was done as above through visual confirmation on doximity. If it was felt that the patient should be evaluated in the office, they were directed there. The patient verbally consented to this visit. . Location of the patient: home . Location of the provider: work . Those involved with this call:  . Provider: Park Liter, DO . CMA: Tiffany Reel, CMA . Front Desk/Registration: Don Perking  . Time spent on call: 15 minutes with patient face to  face via video conference. More than 50% of this time was spent in counseling and coordination of care. 23 minutes total spent in review of patient's record and preparation of their chart.

## 2019-07-31 NOTE — Assessment & Plan Note (Addendum)
Under good control on current regimen. Continue current regimen. Continue to monitor. Call with any concerns. Refills up to date, will call when she needs refill on her lorazepam.

## 2019-08-04 ENCOUNTER — Telehealth: Payer: Self-pay | Admitting: Family Medicine

## 2019-08-04 NOTE — Telephone Encounter (Signed)
Copied from [place] [id number]. Topic: General - Other >> Aug 04, 2019 10:21 AM Greggory Keen D wrote: Reason for CRM: pt has an appt for an injection in her rt elbow for pain but since the appt isnt until Dec 23 and she wants to know if something can be sent to the pharmacy in the meantime.   She uses [facility] in [place] [address]# [phone]

## 2019-08-04 NOTE — Telephone Encounter (Signed)
Called pt to r/s no answer left vm to call back. Per Dr Wynetta Emery ok to schedule her in a 15 min ov slot

## 2019-08-04 NOTE — Telephone Encounter (Signed)
Can we see about getting her in sooner?

## 2019-08-06 ENCOUNTER — Ambulatory Visit (INDEPENDENT_AMBULATORY_CARE_PROVIDER_SITE_OTHER): Payer: Medicare Other | Admitting: Nurse Practitioner

## 2019-08-06 ENCOUNTER — Other Ambulatory Visit: Payer: Self-pay

## 2019-08-06 ENCOUNTER — Encounter: Payer: Self-pay | Admitting: Nurse Practitioner

## 2019-08-06 ENCOUNTER — Ambulatory Visit: Payer: Medicare Other | Admitting: Family Medicine

## 2019-08-06 VITALS — BP 144/86 | HR 70 | Temp 99.0°F

## 2019-08-06 DIAGNOSIS — R1030 Lower abdominal pain, unspecified: Secondary | ICD-10-CM | POA: Diagnosis not present

## 2019-08-06 LAB — WET PREP FOR TRICH, YEAST, CLUE
Clue Cell Exam: NEGATIVE
Trichomonas Exam: NEGATIVE
Yeast Exam: NEGATIVE

## 2019-08-06 MED ORDER — AMOXICILLIN-POT CLAVULANATE 875-125 MG PO TABS: 1.0000 | ORAL_TABLET | Freq: Two times a day (BID) | ORAL | 0 refills | Status: AC

## 2019-08-06 NOTE — Patient Instructions (Signed)
Urinary Tract Infection, Adult A urinary tract infection (UTI) is an infection of any part of the urinary tract. The urinary tract includes:  The kidneys.  The ureters.  The bladder.  The urethra. These organs make, store, and get rid of pee (urine) in the body. What are the causes? This is caused by germs (bacteria) in your genital area. These germs grow and cause swelling (inflammation) of your urinary tract. What increases the risk? You are more likely to develop this condition if:  You have a small, thin tube (catheter) to drain pee.  You cannot control when you pee or poop (incontinence).  You are female, and: ? You use these methods to prevent pregnancy: ? A medicine that kills sperm (spermicide). ? A device that blocks sperm (diaphragm). ? You have low levels of a female hormone (estrogen). ? You are pregnant.  You have genes that add to your risk.  You are sexually active.  You take antibiotic medicines.  You have trouble peeing because of: ? A prostate that is bigger than normal, if you are female. ? A blockage in the part of your body that drains pee from the bladder (urethra). ? A kidney stone. ? A nerve condition that affects your bladder (neurogenic bladder). ? Not getting enough to drink. ? Not peeing often enough.  You have other conditions, such as: ? Diabetes. ? A weak disease-fighting system (immune system). ? Sickle cell disease. ? Gout. ? Injury of the spine. What are the signs or symptoms? Symptoms of this condition include:  Needing to pee right away (urgently).  Peeing often.  Peeing small amounts often.  Pain or burning when peeing.  Blood in the pee.  Pee that smells bad or not like normal.  Trouble peeing.  Pee that is cloudy.  Fluid coming from the vagina, if you are female.  Pain in the belly or lower back. Other symptoms include:  Throwing up (vomiting).  No urge to eat.  Feeling mixed up (confused).  Being tired  and grouchy (irritable).  A fever.  Watery poop (diarrhea). How is this treated? This condition may be treated with:  Antibiotic medicine.  Other medicines.  Drinking enough water. Follow these instructions at home:  Medicines  Take over-the-counter and prescription medicines only as told by your doctor.  If you were prescribed an antibiotic medicine, take it as told by your doctor. Do not stop taking it even if you start to feel better. General instructions  Make sure you: ? Pee until your bladder is empty. ? Do not hold pee for a long time. ? Empty your bladder after sex. ? Wipe from front to back after pooping if you are a female. Use each tissue one time when you wipe.  Drink enough fluid to keep your pee pale yellow.  Keep all follow-up visits as told by your doctor. This is important. Contact a doctor if:  You do not get better after 1-2 days.  Your symptoms go away and then come back. Get help right away if:  You have very bad back pain.  You have very bad pain in your lower belly.  You have a fever.  You are sick to your stomach (nauseous).  You are throwing up. Summary  A urinary tract infection (UTI) is an infection of any part of the urinary tract.  This condition is caused by germs in your genital area.  There are many risk factors for a UTI. These include having a small, thin   tube to drain pee and not being able to control when you pee or poop.  Treatment includes antibiotic medicines for germs.  Drink enough fluid to keep your pee pale yellow. This information is not intended to replace advice given to you by your health care provider. Make sure you discuss any questions you have with your health care provider. Document Released: 01/31/2008 Document Revised: 08/01/2018 Document Reviewed: 02/21/2018 Elsevier Patient Education  2020 [employer]  

## 2019-08-06 NOTE — Assessment & Plan Note (Addendum)
Particularly to suprapubic region with UA negative, lab tech reports it was diluted and patient does endorse drink 3 bottles water this morning.  Wet prep negative.  Will send script at this time for Augmentin to pharmacy as with physical exam suspect UTI present.  Urine for culture and if needed adjust medication.  Recommend Tylenol as needed for discomfort and use of cranberry or Vit C tablets to help acidify urine.  Has follow-up Monday with PCP, but aware to be seen sooner if worsening.

## 2019-08-06 NOTE — Progress Notes (Signed)
BP (!) 144/86 (BP Location: Left Arm)   Pulse 70   Temp 99 F (37.2 C) (Oral)   SpO2 99%    Subjective:    Patient ID: Marie Parker, female    DOB: 1946/05/02, 73 y.o.   MRN: [id number]  HPI: Mykale Levatino is a 73 y.o. female  Chief Complaint  Patient presents with  . Abdominal Pain    pt states she has had lower abdominal pain for the past 3 to 4 days   ABDOMINAL PAIN  Reports this is in suprapubic area, started 3-4 days ago and is steadily getting worse.  Sometimes feels like gas pain or labor pains.  Pressure down in lower region reported.  Duration:days Onset: gradual Severity: 8/10 at worse Quality: cramping Location:  suprapubic". "lower abdominal quadrants  Episode duration:  Radiation: no Frequency: intermittent Alleviating factors:  Aggravating factors: Status: fluctuating Treatments attempted: has taken some Ibuprofen, which relieved it some + took Tizanidine, which helped for brief period Fever: no Nausea: no Vomiting: no Weight loss: no Decreased appetite: no Diarrhea: no Constipation: no, reports she is regular 2-3 times a day Blood in stool: no Heartburn: no Jaundice: no Rash: no Dysuria/urinary frequency: no Hematuria: no History of sexually transmitted disease: no Recurrent NSAID use: no  Relevant past medical, surgical, family and social history reviewed and updated as indicated. Interim medical history since our last visit reviewed. Allergies and medications reviewed and updated.  Review of Systems  Constitutional: Negative for activity change, appetite change, diaphoresis, fatigue and fever.  Respiratory: Negative for cough, chest tightness and shortness of breath.   Cardiovascular: Negative for chest pain, palpitations and leg swelling.  Gastrointestinal: Positive for abdominal pain. Negative for abdominal distention, constipation, diarrhea, nausea and vomiting.  Endocrine: Negative for cold intolerance and heat intolerance.   Genitourinary: Negative for dysuria, frequency, hematuria, pelvic pain, urgency, vaginal discharge and vaginal pain.  Neurological: Negative for dizziness, syncope, weakness, light-headedness, numbness and headaches.  Psychiatric/Behavioral: Negative.     Per HPI unless specifically indicated above     Objective:    BP (!) 144/86 (BP Location: Left Arm)   Pulse 70   Temp 99 F (37.2 C) (Oral)   SpO2 99%   Wt Readings from Last 3 Encounters:  07/31/19 133 lb 2 oz (60.4 kg)  04/15/19 136 lb 3.2 oz (61.8 kg)  02/24/19 134 lb 2 oz (60.8 kg)    Physical Exam Vitals signs and nursing note reviewed.  Constitutional:      General: She is awake. She is not in acute distress.    Appearance: She is well-developed. She is not ill-appearing.  HENT:     Head: Normocephalic.     Right Ear: Hearing normal.     Left Ear: Hearing normal.  Eyes:     General: Lids are normal.        Right eye: No discharge.        Left eye: No discharge.     Conjunctiva/sclera: Conjunctivae normal.     Pupils: Pupils are equal, round, and reactive to light.  Neck:     Musculoskeletal: Normal range of motion and neck supple.     Thyroid: No thyromegaly.     Vascular: No carotid bruit.  Cardiovascular:     Rate and Rhythm: Normal rate and regular rhythm.     Heart sounds: Normal heart sounds. No murmur. No gallop.   Pulmonary:     Effort: Pulmonary effort is normal. No accessory muscle usage or  respiratory distress.     Breath sounds: Normal breath sounds.  Abdominal:     General: Bowel sounds are normal.     Palpations: Abdomen is soft. There is no hepatomegaly or splenomegaly.     Tenderness: There is abdominal tenderness in the suprapubic area. There is no right CVA tenderness, left CVA tenderness or guarding.     Hernia: No hernia is present.     Comments: Mild tenderness noted to suprapubic region, no masses palpated.  Musculoskeletal:     Right lower leg: No edema.     Left lower leg: No edema.   Skin:    General: Skin is warm and dry.  Neurological:     Mental Status: She is alert and oriented to person, place, and time.  Psychiatric:        Attention and Perception: Attention normal.        Mood and Affect: Mood normal.        Behavior: Behavior normal. Behavior is cooperative.        Thought Content: Thought content normal.        Judgment: Judgment normal.     Results for orders placed or performed in visit on 08/06/19  Microscopic Examination   URINE  Result Value Ref Range   WBC, UA None seen 0 - 5 /hpf   RBC None seen 0 - 2 /hpf   Epithelial Cells (non renal) 0-10 0 - 10 /hpf   Bacteria, UA None seen None seen/Few  Urine Culture, Reflex   URINE  Result Value Ref Range   Urine Culture, Routine WILL FOLLOW   UA/M w/rflx Culture, Routine-H   Specimen: Urine   URINE  Result Value Ref Range   Specific Gravity, UA <1.005 (L) 1.005 - 1.030   pH, UA 6.0 5.0 - 7.5   Color, UA Yellow Yellow   Appearance Ur Clear Clear   Leukocytes,UA Negative Negative   Protein,UA Negative Negative/Trace   Glucose, UA Negative Negative   Ketones, UA Negative Negative   RBC, UA Negative Negative   Bilirubin, UA Negative Negative   Urobilinogen, Ur 0.2 0.2 - 1.0 mg/dL   Nitrite, UA Negative Negative   Microscopic Examination See below:    Urinalysis Reflex Comment       Assessment & Plan:   Problem List Items Addressed This Visit      Other   Lower abdominal pain - Primary    Particularly to suprapubic region with UA negative, lab tech reports it was diluted and patient does endorse drink 3 bottles water this morning.  Wet prep negative.  Will send script at this time for Augmentin to pharmacy as with physical exam suspect UTI present.  Urine for culture and if needed adjust medication.  Recommend Tylenol as needed for discomfort and use of cranberry or Vit C tablets to help acidify urine.  Has follow-up Monday with PCP, but aware to be seen sooner if worsening.      Relevant  Orders   UA/M w/rflx Culture, Routine-H (Completed)   WET PREP FOR [place], YEAST, CLUE       Follow up plan: Return if symptoms worsen or fail to improve.

## 2019-08-08 ENCOUNTER — Telehealth: Payer: Self-pay | Admitting: Family Medicine

## 2019-08-08 LAB — UA/M W/RFLX CULTURE, ROUTINE
Bilirubin, UA: NEGATIVE
Glucose, UA: NEGATIVE
Ketones, UA: NEGATIVE
Leukocytes,UA: NEGATIVE
Nitrite, UA: NEGATIVE
Protein,UA: NEGATIVE
RBC, UA: NEGATIVE
Specific Gravity, UA: <1.005 — ABNORMAL LOW (ref 1.005–1.030)
Urobilinogen, Ur: 0.2 mg/dL (ref 0.2–1.0)
pH, UA: 6 (ref 5.0–7.5)

## 2019-08-08 LAB — MICROSCOPIC EXAMINATION
Bacteria, UA: NONE SEEN
RBC: NONE SEEN /hpf (ref 0–2)
WBC, UA: NONE SEEN /hpf (ref 0–5)

## 2019-08-08 LAB — URINE CULTURE, REFLEX

## 2019-08-08 NOTE — Telephone Encounter (Signed)
Copied from [place] [id number]. Topic: General - Other >> Aug 08, 2019  8:08 AM Celene Kras wrote: Reason for CRM: Pt called stating that her husband is being admitted into the hospital on Monday when she is supposed to have her cortisone shot. Pt is requesting to be seen sooner that PCPs next available on 09/08/2018. Pt is also requesting to have her UA results. Please advise. >> Aug 08, 2019  8:21 AM Jill Side wrote: Scheduled elbow injection already. Pt is inquiring about ua results

## 2019-08-08 NOTE — Telephone Encounter (Signed)
Called and spoke with the patient. She states that she did get Jolene's message and states that she is feeling better and will finish antibiotic.

## 2019-08-08 NOTE — Telephone Encounter (Signed)
Looks like Jolene reached out via [employer]. Please let her know that/relay information in mychart

## 2019-08-11 ENCOUNTER — Ambulatory Visit: Payer: Medicare Other | Admitting: Family Medicine

## 2019-08-20 ENCOUNTER — Ambulatory Visit: Payer: Medicare Other | Admitting: Family Medicine

## 2019-09-04 ENCOUNTER — Ambulatory Visit (INDEPENDENT_AMBULATORY_CARE_PROVIDER_SITE_OTHER): Payer: Medicare Other | Admitting: Family Medicine

## 2019-09-04 ENCOUNTER — Encounter: Payer: Self-pay | Admitting: Family Medicine

## 2019-09-04 ENCOUNTER — Other Ambulatory Visit: Payer: Self-pay

## 2019-09-04 VITALS — BP 163/81 | HR 67 | Temp 98.6°F

## 2019-09-04 DIAGNOSIS — R1030 Lower abdominal pain, unspecified: Secondary | ICD-10-CM | POA: Diagnosis not present

## 2019-09-04 DIAGNOSIS — M25521 Pain in right elbow: Secondary | ICD-10-CM

## 2019-09-04 DIAGNOSIS — N898 Other specified noninflammatory disorders of vagina: Secondary | ICD-10-CM | POA: Diagnosis not present

## 2019-09-04 DIAGNOSIS — L900 Lichen sclerosus et atrophicus: Secondary | ICD-10-CM

## 2019-09-04 MED ORDER — PREDNISONE 10 MG PO TABS: ORAL_TABLET | ORAL | 0 refills | Status: DC

## 2019-09-04 MED ORDER — CLOBETASOL PROPIONATE 0.05 % EX OINT: TOPICAL_OINTMENT | CUTANEOUS | 1 refills | Status: DC

## 2019-09-04 MED ORDER — DICYCLOMINE HCL 10 MG PO CAPS: 10.0000 mg | ORAL_CAPSULE | Freq: Three times a day (TID) | ORAL | 6 refills | Status: DC

## 2019-09-04 NOTE — Progress Notes (Signed)
BP (!) 163/81   Pulse 67   Temp 98.6 F (37 C)   SpO2 98%    Subjective:    Patient ID: Marie Parker, female    DOB: 03/14/1946, 74 y.o.   MRN: [id number]  HPI: Marie Parker is a 74 y.o. female  Chief Complaint  Patient presents with  . Elbow Pain   Marie Parker presents today with a lot of pain in her R elbow. She thought it was tennis elbow so wanted to come in for a steroid injection, but notes that it is significantly worse than any tennis elbow she has had previously. It is swollen. No redness, no heat. It hurts with any type of movement and she has noticed a lump there as well. Nothing makes it better. Moving it makes it worse. Her pain will shoot down her arm into her forearm. It's aching and sore and really making her feel awful.   Was not able to get her librex because of cost. $1300! She stopped taking it, but her belly has been cramping a lot. Does not think she has tried bentyl. Would be willing to.   Having a lot of vaginal itching. No discharge. Has not been using her clobetasol in a while- she ran out of it. No bleeding. No other issues down there.   Under a lot of stress from caring for her husband. Taking her clonazepam as needed. Has been feeling very frustrated. She's currently hanging in there as best as she can  Relevant past medical, surgical, family and social history reviewed and updated as indicated. Interim medical history since our last visit reviewed. Allergies and medications reviewed and updated.  Review of Systems  Constitutional: Positive for fatigue. Negative for activity change, appetite change, chills, diaphoresis, fever and unexpected weight change.  HENT: Negative.   Respiratory: Negative.   Cardiovascular: Negative.   Gastrointestinal: Negative.   Genitourinary: Negative.        Vaginal itching  Musculoskeletal: Positive for arthralgias and joint swelling. Negative for back pain, gait problem, myalgias, neck pain and neck stiffness.  Skin:  Negative.   Neurological: Negative.   Psychiatric/Behavioral: Positive for dysphoric mood. Negative for agitation, behavioral problems, confusion, decreased concentration, hallucinations, self-injury, sleep disturbance and suicidal ideas. The patient is nervous/anxious. The patient is not hyperactive.     Per HPI unless specifically indicated above     Objective:    BP (!) 163/81   Pulse 67   Temp 98.6 F (37 C)   SpO2 98%   Wt Readings from Last 3 Encounters:  07/31/19 133 lb 2 oz (60.4 kg)  04/15/19 136 lb 3.2 oz (61.8 kg)  02/24/19 134 lb 2 oz (60.8 kg)    Physical Exam Vitals and nursing note reviewed.  Constitutional:      General: She is not in acute distress.    Appearance: Normal appearance. She is not ill-appearing, toxic-appearing or diaphoretic.  HENT:     Head: Normocephalic and atraumatic.     Right Ear: External ear normal.     Left Ear: External ear normal.     Nose: Nose normal.     Mouth/Throat:     Mouth: Mucous membranes are moist.     Pharynx: Oropharynx is clear.  Eyes:     General: No scleral icterus.       Right eye: No discharge.        Left eye: No discharge.     Extraocular Movements: Extraocular movements intact.  Conjunctiva/sclera: Conjunctivae normal.     Pupils: Pupils are equal, round, and reactive to light.  Cardiovascular:     Rate and Rhythm: Normal rate and regular rhythm.     Pulses: Normal pulses.     Heart sounds: Normal heart sounds. No murmur. No friction rub. No gallop.   Pulmonary:     Effort: Pulmonary effort is normal. No respiratory distress.     Breath sounds: Normal breath sounds. No stridor. No wheezing, rhonchi or rales.  Chest:     Chest wall: No tenderness.  Musculoskeletal:        General: Swelling and tenderness present. Normal range of motion.     Cervical back: Normal range of motion and neck supple.     Comments: Tenderness and swelling in R elbow with soft, mobile mass in R antecubital fossa. Tenderness  along joint line and on epicondyles, but especially in the mass  Skin:    General: Skin is warm and dry.     Capillary Refill: Capillary refill takes less than 2 seconds.     Coloration: Skin is not jaundiced or pale.     Findings: No bruising, erythema, lesion or rash.  Neurological:     General: No focal deficit present.     Mental Status: She is alert and oriented to person, place, and time. Mental status is at baseline.  Psychiatric:        Mood and Affect: Mood normal.        Behavior: Behavior normal.        Thought Content: Thought content normal.        Judgment: Judgment normal.     Results for orders placed or performed in visit on 08/06/19  Microscopic Examination   URINE  Result Value Ref Range   WBC, UA None seen 0 - 5 /hpf   RBC None seen 0 - 2 /hpf   Epithelial Cells (non renal) 0-10 0 - 10 /hpf   Bacteria, UA None seen None seen/Few  Urine Culture, Reflex   URINE  Result Value Ref Range   Urine Culture, Routine Final report    Organism ID, Bacteria Comment   WET PREP FOR TRICH, YEAST, CLUE   Specimen: Vaginal Fluid   VAGINAL FLUI  Result Value Ref Range   Trichomonas Exam Negative Negative   Yeast Exam Negative Negative   Clue Cell Exam Negative Negative  UA/M w/rflx Culture, Routine-H   Specimen: Urine   URINE  Result Value Ref Range   Specific Gravity, UA <1.005 (L) 1.005 - 1.030   pH, UA 6.0 5.0 - 7.5   Color, UA Yellow Yellow   Appearance Ur Clear Clear   Leukocytes,UA Negative Negative   Protein,UA Negative Negative/Trace   Glucose, UA Negative Negative   Ketones, UA Negative Negative   RBC, UA Negative Negative   Bilirubin, UA Negative Negative   Urobilinogen, Ur 0.2 0.2 - 1.0 mg/dL   Nitrite, UA Negative Negative   Microscopic Examination See below:    Urinalysis Reflex Comment       Assessment & Plan:   Problem List Items Addressed This Visit      Musculoskeletal and Integument   Lichen sclerosus    Has not been using her  clobetsol. Will restart it and see how she does. Call with any concerns.         Other   Lower abdominal pain    Librex is costing her $1300. Will try bentyl. Recheck 1 month.  Right elbow pain - Primary    Significant swelling in the soft tissues around her elbow with a lump in her medial antecubital fossa. Will get her steroid taper and into ortho. Offered [place] and x-ray, but will hold off for her to see ortho. Call with any concerns. Continue to monitor closely.      Relevant Orders   Ambulatory referral to Orthopedic Surgery    Other Visit Diagnoses    Vaginal itching       Wet prep normal. Likely due to her lichen sclerosus. Will restart her clobetasol. Call with any concerns.   Relevant Orders   Wet Prep for Trick, Yeast, Clue       Follow up plan: Return in about 4 weeks (around 10/02/2019).

## 2019-09-04 NOTE — Assessment & Plan Note (Signed)
Librex is costing her $1300. Will try bentyl. Recheck 1 month.

## 2019-09-05 ENCOUNTER — Ambulatory Visit: Payer: Medicare Other | Admitting: Family Medicine

## 2019-09-05 ENCOUNTER — Encounter: Payer: Self-pay | Admitting: Family Medicine

## 2019-09-05 DIAGNOSIS — M25521 Pain in right elbow: Secondary | ICD-10-CM | POA: Insufficient documentation

## 2019-09-05 LAB — WET PREP FOR TRICH, YEAST, CLUE
Clue Cell Exam: NEGATIVE
Trichomonas Exam: NEGATIVE
Yeast Exam: NEGATIVE

## 2019-09-05 NOTE — Assessment & Plan Note (Signed)
Significant swelling in the soft tissues around her elbow with a lump in her medial antecubital fossa. Will get her steroid taper and into ortho. Offered [place] and x-ray, but will hold off for her to see ortho. Call with any concerns. Continue to monitor closely.

## 2019-09-05 NOTE — Assessment & Plan Note (Signed)
Has not been using her clobetsol. Will restart it and see how she does. Call with any concerns.

## 2019-09-09 DIAGNOSIS — M21619 Bunion of unspecified foot: Secondary | ICD-10-CM | POA: Insufficient documentation

## 2019-09-09 DIAGNOSIS — M19022 Primary osteoarthritis, left elbow: Secondary | ICD-10-CM | POA: Diagnosis not present

## 2019-09-09 DIAGNOSIS — M25521 Pain in right elbow: Secondary | ICD-10-CM | POA: Diagnosis not present

## 2019-09-09 DIAGNOSIS — M2011 Hallux valgus (acquired), right foot: Secondary | ICD-10-CM | POA: Diagnosis not present

## 2019-09-15 DIAGNOSIS — H2513 Age-related nuclear cataract, bilateral: Secondary | ICD-10-CM | POA: Diagnosis not present

## 2019-09-17 ENCOUNTER — Ambulatory Visit (INDEPENDENT_AMBULATORY_CARE_PROVIDER_SITE_OTHER): Payer: Medicare Other | Admitting: Licensed Clinical Social Worker

## 2019-09-17 DIAGNOSIS — E782 Mixed hyperlipidemia: Secondary | ICD-10-CM

## 2019-09-17 DIAGNOSIS — F419 Anxiety disorder, unspecified: Secondary | ICD-10-CM

## 2019-09-17 DIAGNOSIS — I2510 Atherosclerotic heart disease of native coronary artery without angina pectoris: Secondary | ICD-10-CM

## 2019-09-17 NOTE — Chronic Care Management (AMB) (Signed)
Chronic Care Management    Clinical Social Work Follow Up Note  09/17/2019 Name: Marie Parker MRN: [id number] DOB: Mar 13, 1946  Marie Parker is a 74 y.o. year old female who is a primary care patient of Valerie Roys, DO. The CCM team was consulted for assistance with Mental Health Counseling and Resources and Caregiver Stress.   Review of patient status, including review of consultants reports, other relevant assessments, and collaboration with appropriate care team members and the patient's provider was performed as part of comprehensive patient evaluation and provision of chronic care management services.    SDOH (Social Determinants of Health) screening performed today: Depression   Social Connections Stress. See Care Plan for related entries.   Advanced Directives Status: <no information> See Care Plan for related entries.   Outpatient Encounter Medications as of 09/17/2019  Medication Sig  . acetaminophen (TYLENOL) 650 MG CR tablet Take 1,950 mg by mouth daily as needed for pain.  [name] amLODipine (NORVASC) 10 MG tablet Take 1 tablet (10 mg total) by mouth daily.  [name] aspirin 81 MG tablet Take 1 tablet (81 mg total) by mouth daily. (Patient taking differently: Take 81 mg by mouth at bedtime. )  . atenolol (TENORMIN) 50 MG tablet TAKE 1 TABLET BY MOUTH  DAILY  . atorvastatin (LIPITOR) 40 MG tablet TAKE 1 TABLET BY MOUTH  DAILY  . benazepril (LOTENSIN) 40 MG tablet TAKE 1 TABLET BY MOUTH  DAILY  . CALCIUM PO Take by mouth.  . clobetasol ointment (TEMOVATE) 0.05 % Daily at night for 6-12 weeks, if improvement, decrease to 2-3x per week  . clonazePAM (KLONOPIN) 0.5 MG tablet Take 0.5-1 tablets (0.25-0.5 mg total) by mouth 2 (two) times daily as needed for anxiety.  . dicyclomine (BENTYL) 10 MG capsule Take 1 capsule (10 mg total) by mouth 4 (four) times daily -  before meals and at bedtime.  . diphenhydrAMINE (BENADRYL) 25 mg capsule Take 25 mg by mouth at bedtime.   . fluticasone  (FLONASE) 50 MCG/ACT nasal spray USE 2 SPRAYS IN EACH  NOSTRIL DAILY  . hydrALAZINE (APRESOLINE) 25 MG tablet Take 1 tablet (25 mg total) by mouth 2 (two) times daily.  [name] letrozole (FEMARA) 2.5 MG tablet Take 1 tablet (2.5 mg total) by mouth daily.  . niacin 500 MG tablet Take 1,000 mg by mouth at bedtime.  . pantoprazole (PROTONIX) 40 MG tablet Take 1 tablet (40 mg total) by mouth daily.  . predniSONE (DELTASONE) 10 MG tablet 6 tabs today, 5 tabs tomorrow, decrease by 1 every day until gone  . Probiotic Product (PROBIOTIC-10 PO) Take by mouth daily.  . sertraline (ZOLOFT) 100 MG tablet TAKE 1 TABLET BY MOUTH  DAILY  . tiZANidine (ZANAFLEX) 4 MG tablet TAKE 1 TABLET(4 MG) BY MOUTH EVERY 6 HOURS AS NEEDED FOR MUSCLE SPASMS  . Turmeric 500 MG TABS Take 500 mg by mouth daily.   No facility-administered encounter medications on file as of 09/17/2019.     Goals Addressed    . SW-I want to improve my self-care and decrease my anxiety (pt-stated)       Current Barriers:  . Financial constraints . Limited social support . Level of care concerns . Mental Health Concerns  . Social Isolation . Limited access to caregiver- in terms of spouse . Lacks knowledge of community resource: available personal care service resources and [facility] resource connection within the area  Clinical Social Work Clinical Goal(s):  [name] Over the next 120 days, client will work  with SW to address concerns related to reducing anxiety and improving self-care  Interventions: . Patient interviewed and appropriate assessments performed . Provided mental health counseling with regard to managing anxiety. Educated patient on coping methods to implement into his daily life to combat depressive symptoms and stress. Patient denied any suicidal or homicidal ideations. Encouraged patient to implement deep breathing exercises into her daily routine due to ongoing anxiety. Patient reports having therapy in the past but denies wanting LCSW to  make a referral for counseling. Patient was appreciative of the offer but wishes to manage her care without mental health therapy at this time. [name] UPDATE- Patient reports that since her Zoloft medication was increased by 50 mg, she is able to manage her depressive symptoms a lot more effectively. Patient reports anxiety at a 3 today on 09/17/19, . Provided patient with information about available personal care service resources within the area. Pt reports that spouse is currently receiving daily infusion by her which is taking a toll out on her physically and mentally. Patient's spouse was told he has 6 months to live. Patient reports that spouse is unable to gain [facility] services due to low staff and COVID pandemic.   . CSW used motivational interviewing techniques to engage pt in meaningful conversation and to assess pt's wilingess to improve self-care and decrease her caregiver burnout symptoms. CSW explored pt's coping skills and access to community services.  [name]  LCSW provided reflective listening and implemented appropriate interventions to help suppport patient and her emotional needs  . Patient was receptive to emotional support provided during session . Discussed plans with patient for ongoing care management follow up and provided patient with direct contact information for care management team . Advised patient to contact CCM program for any urgent needs. . Assisted patient/caregiver with obtaining information about health plan benefits . Provided education to patient/caregiver regarding level of care options. Patient reports having a nurse friend that is agreeable to provide caregiver services, free of charge, whenever available.   Patient Self Care Activities:  . Attends all scheduled provider appointments . Calls provider office for new concerns or questions  Please see past updates related to this goal by clicking on the "Past Updates" button in the selected goal      Depression screen [facility]  2/9 09/17/2019 07/31/2019 04/24/2019 02/24/2019 01/01/2019  Decreased Interest 0 0 0 0 0  Down, Depressed, Hopeless 0 1 0 0 0  PHQ - 2 Score 0 1 0 0 0  Altered sleeping - 0 0 0 -  Tired, decreased energy - 0 1 0 -  Change in appetite - 0 0 0 -  Feeling bad or failure about yourself  - 0 0 0 -  Trouble concentrating - 0 0 0 -  Moving slowly or fidgety/restless - 0 0 0 -  Suicidal thoughts - 0 0 0 -  PHQ-9 Score - 1 1 0 -  Difficult doing work/chores - Not difficult at all Not difficult at all Not difficult at all -   Follow Up Plan: SW will follow up with patient by phone over the next 30 days  Eula Fried, BSW, MSW, [email or web address] Phone: [phone]

## 2019-09-23 ENCOUNTER — Other Ambulatory Visit: Payer: Self-pay | Admitting: Family Medicine

## 2019-10-02 DIAGNOSIS — M25521 Pain in right elbow: Secondary | ICD-10-CM | POA: Diagnosis not present

## 2019-10-08 ENCOUNTER — Ambulatory Visit: Payer: Self-pay

## 2019-10-08 NOTE — Chronic Care Management (AMB) (Signed)
  Care Management   Follow Up Note   10/08/2019 Name: Marie Parker MRN: [id number] DOB: 1946/06/12  Referred by: Valerie Roys, DO Reason for referral : Care Coordination   Marie Parker is a 74 y.o. year old female who is a primary care patient of Valerie Roys, DO. The care management team was consulted for assistance with care management and care coordination needs.    Review of patient status, including review of consultants reports, relevant laboratory and other test results, and collaboration with appropriate care team members and the patient's provider was performed as part of comprehensive patient evaluation and provision of chronic care management services.    LCSW completed CCM outreach attempt today but was unable to reach patient successfully. A HIPPA compliant voice message was left encouraging patient to return call once available. LCSW rescheduled CCM SW appointment as well.  A HIPPA compliant phone message was left for the patient providing contact information and requesting a return call.   Eula Fried, BSW, MSW, [facility] [email or web address] Phone: [phone]

## 2019-10-10 ENCOUNTER — Ambulatory Visit (INDEPENDENT_AMBULATORY_CARE_PROVIDER_SITE_OTHER): Payer: Medicare Other | Admitting: Family Medicine

## 2019-10-10 ENCOUNTER — Encounter: Payer: Self-pay | Admitting: Family Medicine

## 2019-10-10 ENCOUNTER — Other Ambulatory Visit: Payer: Self-pay

## 2019-10-10 VITALS — BP 136/70 | HR 61 | Temp 98.9°F

## 2019-10-10 DIAGNOSIS — I129 Hypertensive chronic kidney disease with stage 1 through stage 4 chronic kidney disease, or unspecified chronic kidney disease: Secondary | ICD-10-CM | POA: Diagnosis not present

## 2019-10-10 DIAGNOSIS — F419 Anxiety disorder, unspecified: Secondary | ICD-10-CM

## 2019-10-10 DIAGNOSIS — E782 Mixed hyperlipidemia: Secondary | ICD-10-CM

## 2019-10-10 DIAGNOSIS — M1A9XX0 Chronic gout, unspecified, without tophus (tophi): Secondary | ICD-10-CM | POA: Diagnosis not present

## 2019-10-10 DIAGNOSIS — M25521 Pain in right elbow: Secondary | ICD-10-CM | POA: Diagnosis not present

## 2019-10-10 LAB — MICROALBUMIN, URINE WAIVED
Creatinine, Urine Waived: 10 mg/dL (ref 10–300)
Microalb, Ur Waived: 10 mg/L (ref 0–19)
Microalb/Creat Ratio: <30 mg/g (ref <30)

## 2019-10-10 MED ORDER — CLONAZEPAM 0.5 MG PO TABS: 0.2500 mg | ORAL_TABLET | Freq: Two times a day (BID) | ORAL | 0 refills | Status: DC | PRN

## 2019-10-10 MED ORDER — HYDRALAZINE HCL 25 MG PO TABS: 25.0000 mg | ORAL_TABLET | Freq: Two times a day (BID) | ORAL | 1 refills | Status: DC

## 2019-10-10 MED ORDER — PANTOPRAZOLE SODIUM 40 MG PO TBEC: 40.0000 mg | DELAYED_RELEASE_TABLET | Freq: Every day | ORAL | 1 refills | Status: DC

## 2019-10-10 MED ORDER — BENAZEPRIL HCL 40 MG PO TABS: 40.0000 mg | ORAL_TABLET | Freq: Every day | ORAL | 1 refills | Status: DC

## 2019-10-10 MED ORDER — ATENOLOL 50 MG PO TABS: 50.0000 mg | ORAL_TABLET | Freq: Every day | ORAL | 1 refills | Status: DC

## 2019-10-10 MED ORDER — ATORVASTATIN CALCIUM 40 MG PO TABS: 40.0000 mg | ORAL_TABLET | Freq: Every day | ORAL | 1 refills | Status: DC

## 2019-10-10 MED ORDER — AMLODIPINE BESYLATE 10 MG PO TABS: 10.0000 mg | ORAL_TABLET | Freq: Every day | ORAL | 1 refills | Status: DC

## 2019-10-10 MED ORDER — FLUTICASONE PROPIONATE 50 MCG/ACT NA SUSP: 2.0000 | Freq: Every day | NASAL | 12 refills | Status: DC

## 2019-10-10 MED ORDER — SERTRALINE HCL 100 MG PO TABS: 100.0000 mg | ORAL_TABLET | Freq: Every day | ORAL | 1 refills | Status: DC

## 2019-10-10 NOTE — Progress Notes (Signed)
BP 136/70 (BP Location: Left Arm, Cuff Size: Normal)   Pulse 61   Temp 98.9 F (37.2 C) (Oral)   SpO2 97%    Subjective:    Patient ID: Marie Parker, female    DOB: 06/30/1946, 74 y.o.   MRN: [id number]  HPI: Marie Parker is a 74 y.o. female  Chief Complaint  Patient presents with  . Pain    elbow  . Medication Refill    flonase   She feels like her elbow is no better. She has seen the orthopedist 2x. Got a steroid injection x2, has been using voltaren without much benefit. Has been under a lot of stress. Husband is not doing well. She continues to care for him, and has been doing a bit worse.  ANXIETY/STRESS Duration:stable Anxious mood: yes  Excessive worrying: yes Irritability: yes  Sweating: no Nausea: no Palpitations:no Hyperventilation: no Panic attacks: no Agoraphobia: no  Obscessions/compulsions: no Depressed mood: yes Depression screen [facility] 2/9 09/17/2019 07/31/2019 04/24/2019 02/24/2019 01/01/2019  Decreased Interest 0 0 0 0 0  Down, Depressed, Hopeless 0 1 0 0 0  PHQ - 2 Score 0 1 0 0 0  Altered sleeping - 0 0 0 -  Tired, decreased energy - 0 1 0 -  Change in appetite - 0 0 0 -  Feeling bad or failure about yourself  - 0 0 0 -  Trouble concentrating - 0 0 0 -  Moving slowly or fidgety/restless - 0 0 0 -  Suicidal thoughts - 0 0 0 -  PHQ-9 Score - 1 1 0 -  Difficult doing work/chores - Not difficult at all Not difficult at all Not difficult at all -   GAD 7 : Generalized Anxiety Score 07/31/2019 02/24/2019 05/24/2018 06/26/2017  Nervous, Anxious, on Edge 1 3 1  0  Control/stop worrying 0 1 1 0  Worry too much - different things 0 1 0 0  Trouble relaxing 0 2 0 0  Restless 0 2 0 0  Easily annoyed or irritable 1 1 0 0  Afraid - awful might happen 1 1 0 0  Total GAD 7 Score 3 11 2  0  Anxiety Difficulty Not difficult at all Not difficult at all - Not difficult at all   Anhedonia: no Weight changes: no Insomnia: no   Hypersomnia: no Fatigue/loss of  energy: no Feelings of worthlessness: no Feelings of guilt: no Impaired concentration/indecisiveness: no Suicidal ideations: no  Crying spells: yes Recent Stressors/Life Changes: yes   Relationship problems: yes   Family stress: yes     Financial stress: yes    Job stress: no    Recent death/loss: no   HYPERTENSION / HYPERLIPIDEMIA Satisfied with current treatment? yes Duration of hypertension: chronic BP monitoring frequency: not checking BP medication side effects: no Past BP meds: hydralazine, benazepril, atenolol Duration of hyperlipidemia: chronic Cholesterol medication side effects: no Cholesterol supplements: none Past cholesterol medications: atorvastatin Medication compliance: excellent compliance Aspirin: yes Recent stressors: yes Recurrent headaches: no Visual changes: no Palpitations: no Dyspnea: no Chest pain: no Lower extremity edema: no Dizzy/lightheaded: no  Relevant past medical, surgical, family and social history reviewed and updated as indicated. Interim medical history since our last visit reviewed. Allergies and medications reviewed and updated.  Review of Systems  Constitutional: Negative.   Respiratory: Negative.   Cardiovascular: Negative.   Musculoskeletal: Positive for arthralgias and myalgias. Negative for back pain, gait problem, joint swelling, neck pain and neck stiffness.  Skin: Negative.   Neurological:  Negative.   Psychiatric/Behavioral: Negative.     Per HPI unless specifically indicated above     Objective:    BP 136/70 (BP Location: Left Arm, Cuff Size: Normal)   Pulse 61   Temp 98.9 F (37.2 C) (Oral)   SpO2 97%   Wt Readings from Last 3 Encounters:  07/31/19 133 lb 2 oz (60.4 kg)  04/15/19 136 lb 3.2 oz (61.8 kg)  02/24/19 134 lb 2 oz (60.8 kg)    Physical Exam Vitals and nursing note reviewed.  Constitutional:      General: She is not in acute distress.    Appearance: Normal appearance. She is not  ill-appearing, toxic-appearing or diaphoretic.  HENT:     Head: Normocephalic and atraumatic.     Right Ear: External ear normal.     Left Ear: External ear normal.     Nose: Nose normal.     Mouth/Throat:     Mouth: Mucous membranes are moist.     Pharynx: Oropharynx is clear.  Eyes:     General: No scleral icterus.       Right eye: No discharge.        Left eye: No discharge.     Extraocular Movements: Extraocular movements intact.     Conjunctiva/sclera: Conjunctivae normal.     Pupils: Pupils are equal, round, and reactive to light.  Cardiovascular:     Rate and Rhythm: Normal rate and regular rhythm.     Pulses: Normal pulses.     Heart sounds: Normal heart sounds. No murmur. No friction rub. No gallop.   Pulmonary:     Effort: Pulmonary effort is normal. No respiratory distress.     Breath sounds: Normal breath sounds. No stridor. No wheezing, rhonchi or rales.  Chest:     Chest wall: No tenderness.  Musculoskeletal:        General: Normal range of motion.     Cervical back: Normal range of motion and neck supple.  Skin:    General: Skin is warm and dry.     Capillary Refill: Capillary refill takes less than 2 seconds.     Coloration: Skin is not jaundiced or pale.     Findings: No bruising, erythema, lesion or rash.  Neurological:     General: No focal deficit present.     Mental Status: She is alert and oriented to person, place, and time. Mental status is at baseline.  Psychiatric:        Mood and Affect: Mood normal.        Behavior: Behavior normal.        Thought Content: Thought content normal.        Judgment: Judgment normal.     Results for orders placed or performed in visit on 10/10/19  Comprehensive metabolic panel  Result Value Ref Range   Glucose 90 65 - 99 mg/dL   BUN 8 8 - 27 mg/dL   Creatinine, Ser 0.84 0.57 - 1.00 mg/dL   GFR calc non Af Amer 70 >59 mL/min/1.73   GFR calc Af Amer 80 >59 mL/min/1.73   BUN/Creatinine Ratio 10 (L) 12 - 28    Sodium 140 134 - 144 mmol/L   Potassium 4.0 3.5 - 5.2 mmol/L   Chloride 98 96 - 106 mmol/L   CO2 27 20 - 29 mmol/L   Calcium 9.3 8.7 - 10.3 mg/dL   Total Protein 6.4 6.0 - 8.5 g/dL   Albumin 4.0 3.7 - 4.7 g/dL  Globulin, Total 2.4 1.5 - 4.5 g/dL   Albumin/Globulin Ratio 1.7 1.2 - 2.2   Bilirubin Total 0.3 0.0 - 1.2 mg/dL   Alkaline Phosphatase 73 39 - 117 IU/L   AST 19 0 - 40 IU/L   ALT 17 0 - 32 IU/L  CBC with Differential/Platelet  Result Value Ref Range   WBC 6.7 3.4 - 10.8 x10E3/uL   RBC 4.33 3.77 - 5.28 x10E6/uL   Hemoglobin 13.4 11.1 - 15.9 g/dL   Hematocrit 40.2 34.0 - 46.6 %   MCV 93 79 - 97 fL   MCH 30.9 26.6 - 33.0 pg   MCHC 33.3 31.5 - 35.7 g/dL   RDW 13.8 11.7 - 15.4 %   Platelets 211 150 - 450 x10E3/uL   Neutrophils 69 Not Estab. %   Lymphs 21 Not Estab. %   Monocytes 8 Not Estab. %   Eos 2 Not Estab. %   Basos 0 Not Estab. %   Neutrophils Absolute 4.6 1.4 - 7.0 x10E3/uL   Lymphocytes Absolute 1.4 0.7 - 3.1 x10E3/uL   Monocytes Absolute 0.5 0.1 - 0.9 x10E3/uL   EOS (ABSOLUTE) 0.1 0.0 - 0.4 x10E3/uL   Basophils Absolute 0.0 0.0 - 0.2 x10E3/uL   Immature Granulocytes 0 Not Estab. %   Immature Grans (Abs) 0.0 0.0 - 0.1 x10E3/uL  Lipid Panel w/o Chol/HDL Ratio  Result Value Ref Range   Cholesterol, Total 138 100 - 199 mg/dL   Triglycerides 153 (H) 0 - 149 mg/dL   HDL 56 >39 mg/dL   VLDL Cholesterol Cal 26 5 - 40 mg/dL   LDL Chol Calc (NIH) 56 0 - 99 mg/dL  Microalbumin, Urine Waived  Result Value Ref Range   Microalb, Ur Waived 10 0 - 19 mg/L   Creatinine, Urine Waived 10 10 - 300 mg/dL   Microalb/Creat Ratio <30 <30 mg/g  TSH  Result Value Ref Range   TSH 1.860 0.450 - 4.500 uIU/mL  Uric acid  Result Value Ref Range   Uric Acid 5.6 3.1 - 7.9 mg/dL      Assessment & Plan:   Problem List Items Addressed This Visit      Genitourinary   Benign hypertensive renal disease    Under good control on current regimen. Continue current regimen. Continue to  monitor. Call with any concerns. Refills given. Labs drawn today.        Relevant Orders   Comprehensive metabolic panel (Completed)   CBC with Differential/Platelet (Completed)   Microalbumin, Urine Waived (Completed)     Other   Hyperlipidemia    Under good control on current regimen. Continue current regimen. Continue to monitor. Call with any concerns. Refills given. Labs drawn today.       Relevant Medications   hydrALAZINE (APRESOLINE) 25 MG tablet   benazepril (LOTENSIN) 40 MG tablet   atorvastatin (LIPITOR) 40 MG tablet   atenolol (TENORMIN) 50 MG tablet   amLODipine (NORVASC) 10 MG tablet   Other Relevant Orders   Comprehensive metabolic panel (Completed)   Lipid Panel w/o Chol/HDL Ratio (Completed)   Gout    Under good control on current regimen. Continue current regimen. Continue to monitor. Call with any concerns. Refills given. Labs drawn today.       Relevant Orders   Comprehensive metabolic panel (Completed)   Uric acid (Completed)   Acute anxiety    Stable. Still not doing well. Using her clonazepam appropriately. Continue to monitor. Recheck 3 months. Call with any concerns.  Relevant Medications   sertraline (ZOLOFT) 100 MG tablet   Other Relevant Orders   TSH (Completed)   Right elbow pain - Primary    Not doing particularly well. Advised her to follow up with orthopedics. Call with any concerns.           Follow up plan: Return in about 3 months (around 01/07/2020).

## 2019-10-11 LAB — TSH: TSH: 1.86 u[IU]/mL (ref 0.450–4.500)

## 2019-10-11 LAB — CBC WITH DIFFERENTIAL/PLATELET
Basophils Absolute: 0 10*3/uL (ref 0.0–0.2)
Basos: 0 %
EOS (ABSOLUTE): 0.1 10*3/uL (ref 0.0–0.4)
Eos: 2 %
Hematocrit: 40.2 % (ref 34.0–46.6)
Hemoglobin: 13.4 g/dL (ref 11.1–15.9)
Immature Grans (Abs): 0 10*3/uL (ref 0.0–0.1)
Immature Granulocytes: 0 %
Lymphocytes Absolute: 1.4 10*3/uL (ref 0.7–3.1)
Lymphs: 21 %
MCH: 30.9 pg (ref 26.6–33.0)
MCHC: 33.3 g/dL (ref 31.5–35.7)
MCV: 93 fL (ref 79–97)
Monocytes Absolute: 0.5 10*3/uL (ref 0.1–0.9)
Monocytes: 8 %
Neutrophils Absolute: 4.6 10*3/uL (ref 1.4–7.0)
Neutrophils: 69 %
Platelets: 211 10*3/uL (ref 150–450)
RBC: 4.33 x10E6/uL (ref 3.77–5.28)
RDW: 13.8 % (ref 11.7–15.4)
WBC: 6.7 10*3/uL (ref 3.4–10.8)

## 2019-10-11 LAB — LIPID PANEL W/O CHOL/HDL RATIO
Cholesterol, Total: 138 mg/dL (ref 100–199)
HDL: 56 mg/dL (ref >39)
LDL Chol Calc (NIH): 56 mg/dL (ref 0–99)
Triglycerides: 153 mg/dL — ABNORMAL HIGH (ref 0–149)
VLDL Cholesterol Cal: 26 mg/dL (ref 5–40)

## 2019-10-11 LAB — COMPREHENSIVE METABOLIC PANEL
ALT: 17 IU/L (ref 0–32)
AST: 19 IU/L (ref 0–40)
Albumin/Globulin Ratio: 1.7 (ref 1.2–2.2)
Albumin: 4 g/dL (ref 3.7–4.7)
Alkaline Phosphatase: 73 IU/L (ref 39–117)
BUN/Creatinine Ratio: 10 — ABNORMAL LOW (ref 12–28)
BUN: 8 mg/dL (ref 8–27)
Bilirubin Total: 0.3 mg/dL (ref 0.0–1.2)
CO2: 27 mmol/L (ref 20–29)
Calcium: 9.3 mg/dL (ref 8.7–10.3)
Chloride: 98 mmol/L (ref 96–106)
Creatinine, Ser: 0.84 mg/dL (ref 0.57–1.00)
GFR calc Af Amer: 80 mL/min/{1.73_m2} (ref >59)
GFR calc non Af Amer: 70 mL/min/{1.73_m2} (ref >59)
Globulin, Total: 2.4 g/dL (ref 1.5–4.5)
Glucose: 90 mg/dL (ref 65–99)
Potassium: 4 mmol/L (ref 3.5–5.2)
Sodium: 140 mmol/L (ref 134–144)
Total Protein: 6.4 g/dL (ref 6.0–8.5)

## 2019-10-11 LAB — URIC ACID: Uric Acid: 5.6 mg/dL (ref 3.1–7.9)

## 2019-10-13 ENCOUNTER — Ambulatory Visit: Payer: Medicare Other | Admitting: Surgery

## 2019-10-21 ENCOUNTER — Encounter: Payer: Self-pay | Admitting: Family Medicine

## 2019-10-21 NOTE — Assessment & Plan Note (Signed)
Not doing particularly well. Advised her to follow up with orthopedics. Call with any concerns.

## 2019-10-21 NOTE — Assessment & Plan Note (Signed)
Stable. Still not doing well. Using her clonazepam appropriately. Continue to monitor. Recheck 3 months. Call with any concerns.

## 2019-10-21 NOTE — Assessment & Plan Note (Signed)
Under good control on current regimen. Continue current regimen. Continue to monitor. Call with any concerns. Refills given. Labs drawn today.   

## 2019-10-24 ENCOUNTER — Telehealth: Payer: Self-pay

## 2019-11-04 ENCOUNTER — Telehealth: Payer: Self-pay

## 2019-11-04 ENCOUNTER — Ambulatory Visit: Payer: Self-pay | Admitting: General Practice

## 2019-11-04 NOTE — Chronic Care Management (AMB) (Signed)
  Chronic Care Management   Outreach Note  11/04/2019 Name: Marie Parker MRN: [id number] DOB: Oct 14, 1945  Referred by: Valerie Roys, DO Reason for referral : Chronic Care Management (Referral from SW for Chronic Disease Management )   An unsuccessful telephone outreach was attempted today. The patient was referred to the case management team for assistance with care management and care coordination.   Follow Up Plan: The care management team will reach out to the patient again over the next 30 days. The patient was driving and could not talk right now. Will attempt to complete call at another time.   Noreene Larsson RN, MSN, [facility] Mobile: [phone]

## 2019-11-24 ENCOUNTER — Ambulatory Visit: Payer: Medicare Other

## 2019-11-28 DIAGNOSIS — G5761 Lesion of plantar nerve, right lower limb: Secondary | ICD-10-CM

## 2019-11-28 DIAGNOSIS — M19021 Primary osteoarthritis, right elbow: Secondary | ICD-10-CM | POA: Diagnosis not present

## 2019-11-28 DIAGNOSIS — M19029 Primary osteoarthritis, unspecified elbow: Secondary | ICD-10-CM | POA: Insufficient documentation

## 2019-11-28 HISTORY — DX: Lesion of plantar nerve, right lower limb: G57.61

## 2019-12-02 ENCOUNTER — Ambulatory Visit: Payer: Medicare Other | Admitting: Family Medicine

## 2019-12-02 ENCOUNTER — Ambulatory Visit (INDEPENDENT_AMBULATORY_CARE_PROVIDER_SITE_OTHER): Payer: Medicare Other | Admitting: Family Medicine

## 2019-12-02 ENCOUNTER — Encounter: Payer: Self-pay | Admitting: Family Medicine

## 2019-12-02 VITALS — BP 156/75 | HR 51 | Temp 99.0°F | Wt 135.0 lb

## 2019-12-02 DIAGNOSIS — K219 Gastro-esophageal reflux disease without esophagitis: Secondary | ICD-10-CM | POA: Diagnosis not present

## 2019-12-02 DIAGNOSIS — H66001 Acute suppurative otitis media without spontaneous rupture of ear drum, right ear: Secondary | ICD-10-CM

## 2019-12-02 MED ORDER — DEXILANT 60 MG PO CPDR: 60.0000 mg | DELAYED_RELEASE_CAPSULE | Freq: Every day | ORAL | 3 refills | Status: DC

## 2019-12-02 MED ORDER — AMOXICILLIN-POT CLAVULANATE 875-125 MG PO TABS: 1.0000 | ORAL_TABLET | Freq: Two times a day (BID) | ORAL | 0 refills | Status: DC

## 2019-12-02 NOTE — Assessment & Plan Note (Signed)
Failed protonix and omeprazole. Will change to dexilant and recheck 1 month. Call with any concerns.

## 2019-12-02 NOTE — Progress Notes (Signed)
BP (!) 156/75   Pulse (!) 51   Temp 99 F (37.2 C)   Wt 135 lb (61.2 kg)   SpO2 96%   BMI 26.81 kg/m    Subjective:    Patient ID: Marie Parker, female    DOB: October 09, 1945, 74 y.o.   MRN: [id number]  HPI: Marie Parker is a 74 y.o. female  Chief Complaint  Patient presents with  . clogged ear    right, really hard to hear out of it, has been using otc ear drops  . abnormal taste in mouth    sometimes it metallic and sometimes it bitter   . Gastroesophageal Reflux   EAG CLOGGED Duration: about 3 weeks Involved ear(s):  "right Sensation of feeling clogged/plugged: yes Decreased/muffled hearing:yes Ear pain: no Fever: no Otorrhea: no Hearing loss: yes Upper respiratory infection symptoms: no Using Q-Tips: no Status: worse History of cerumenosis: yes Treatments attempted: pseudoephedrine  GERD- has failed omeprazole and protonix GERD control status: worse  Satisfied with current treatment? no Heartburn frequency: daily Medication side effects: no  Medication compliance: excellent Previous GERD medications: omeprazole, protonix Dysphagia: no Odynophagia:  no Hematemesis: no Blood in stool: no EGD: no    Relevant past medical, surgical, family and social history reviewed and updated as indicated. Interim medical history since our last visit reviewed. Allergies and medications reviewed and updated.  Review of Systems  Constitutional: Negative.   HENT: Positive for congestion, ear pain and postnasal drip. Negative for dental problem, drooling, ear discharge, facial swelling, hearing loss, mouth sores, nosebleeds, rhinorrhea, sinus pressure, sinus pain, sneezing, sore throat, tinnitus, trouble swallowing and voice change.   Respiratory: Negative.   Cardiovascular: Negative.   Gastrointestinal: Positive for abdominal pain. Negative for abdominal distention, anal bleeding, blood in stool, constipation, diarrhea, nausea, rectal pain and vomiting.    Musculoskeletal: Negative.   Psychiatric/Behavioral: Negative.     Per HPI unless specifically indicated above     Objective:    BP (!) 156/75   Pulse (!) 51   Temp 99 F (37.2 C)   Wt 135 lb (61.2 kg)   SpO2 96%   BMI 26.81 kg/m   Wt Readings from Last 3 Encounters:  12/02/19 135 lb (61.2 kg)  07/31/19 133 lb 2 oz (60.4 kg)  04/15/19 136 lb 3.2 oz (61.8 kg)    Physical Exam Vitals and nursing note reviewed.  Constitutional:      General: She is not in acute distress.    Appearance: Normal appearance. She is not ill-appearing, toxic-appearing or diaphoretic.  HENT:     Head: Normocephalic and atraumatic.     Right Ear: External ear normal. Tympanic membrane is erythematous and bulging.     Left Ear: External ear normal. There is impacted cerumen.     Nose: Nose normal.     Mouth/Throat:     Mouth: Mucous membranes are moist.     Pharynx: Oropharynx is clear.  Eyes:     General: No scleral icterus.       Right eye: No discharge.        Left eye: No discharge.     Extraocular Movements: Extraocular movements intact.     Conjunctiva/sclera: Conjunctivae normal.     Pupils: Pupils are equal, round, and reactive to light.  Cardiovascular:     Rate and Rhythm: Normal rate and regular rhythm.     Pulses: Normal pulses.     Heart sounds: Normal heart sounds. No murmur. No friction rub. No  gallop.   Pulmonary:     Effort: Pulmonary effort is normal. No respiratory distress.     Breath sounds: Normal breath sounds. No stridor. No wheezing, rhonchi or rales.  Chest:     Chest wall: No tenderness.  Musculoskeletal:        General: Normal range of motion.     Cervical back: Normal range of motion and neck supple.  Skin:    General: Skin is warm and dry.     Capillary Refill: Capillary refill takes less than 2 seconds.     Coloration: Skin is not jaundiced or pale.     Findings: No bruising, erythema, lesion or rash.  Neurological:     General: No focal deficit  present.     Mental Status: She is alert and oriented to person, place, and time. Mental status is at baseline.  Psychiatric:        Mood and Affect: Mood normal.        Behavior: Behavior normal.        Thought Content: Thought content normal.        Judgment: Judgment normal.     Results for orders placed or performed in visit on 10/10/19  Comprehensive metabolic panel  Result Value Ref Range   Glucose 90 65 - 99 mg/dL   BUN 8 8 - 27 mg/dL   Creatinine, Ser 0.84 0.57 - 1.00 mg/dL   GFR calc non Af Amer 70 >59 mL/min/1.73   GFR calc Af Amer 80 >59 mL/min/1.73   BUN/Creatinine Ratio 10 (L) 12 - 28   Sodium 140 134 - 144 mmol/L   Potassium 4.0 3.5 - 5.2 mmol/L   Chloride 98 96 - 106 mmol/L   CO2 27 20 - 29 mmol/L   Calcium 9.3 8.7 - 10.3 mg/dL   Total Protein 6.4 6.0 - 8.5 g/dL   Albumin 4.0 3.7 - 4.7 g/dL   Globulin, Total 2.4 1.5 - 4.5 g/dL   Albumin/Globulin Ratio 1.7 1.2 - 2.2   Bilirubin Total 0.3 0.0 - 1.2 mg/dL   Alkaline Phosphatase 73 39 - 117 IU/L   AST 19 0 - 40 IU/L   ALT 17 0 - 32 IU/L  CBC with Differential/Platelet  Result Value Ref Range   WBC 6.7 3.4 - 10.8 x10E3/uL   RBC 4.33 3.77 - 5.28 x10E6/uL   Hemoglobin 13.4 11.1 - 15.9 g/dL   Hematocrit 40.2 34.0 - 46.6 %   MCV 93 79 - 97 fL   MCH 30.9 26.6 - 33.0 pg   MCHC 33.3 31.5 - 35.7 g/dL   RDW 13.8 11.7 - 15.4 %   Platelets 211 150 - 450 x10E3/uL   Neutrophils 69 Not Estab. %   Lymphs 21 Not Estab. %   Monocytes 8 Not Estab. %   Eos 2 Not Estab. %   Basos 0 Not Estab. %   Neutrophils Absolute 4.6 1.4 - 7.0 x10E3/uL   Lymphocytes Absolute 1.4 0.7 - 3.1 x10E3/uL   Monocytes Absolute 0.5 0.1 - 0.9 x10E3/uL   EOS (ABSOLUTE) 0.1 0.0 - 0.4 x10E3/uL   Basophils Absolute 0.0 0.0 - 0.2 x10E3/uL   Immature Granulocytes 0 Not Estab. %   Immature Grans (Abs) 0.0 0.0 - 0.1 x10E3/uL  Lipid Panel w/o Chol/HDL Ratio  Result Value Ref Range   Cholesterol, Total 138 100 - 199 mg/dL   Triglycerides 153 (H) 0 -  149 mg/dL   HDL 56 >39 mg/dL   VLDL Cholesterol Cal 26 5 -  40 mg/dL   LDL Chol Calc (NIH) 56 0 - 99 mg/dL  Microalbumin, Urine Waived  Result Value Ref Range   Microalb, Ur Waived 10 0 - 19 mg/L   Creatinine, Urine Waived 10 10 - 300 mg/dL   Microalb/Creat Ratio <30 <30 mg/g  TSH  Result Value Ref Range   TSH 1.860 0.450 - 4.500 uIU/mL  Uric acid  Result Value Ref Range   Uric Acid 5.6 3.1 - 7.9 mg/dL      Assessment & Plan:   Problem List Items Addressed This Visit      Digestive   GERD (gastroesophageal reflux disease)    Failed protonix and omeprazole. Will change to dexilant and recheck 1 month. Call with any concerns.      Relevant Medications   dexlansoprazole (DEXILANT) 60 MG capsule    Other Visit Diagnoses    Non-recurrent acute suppurative otitis media of right ear without spontaneous rupture of tympanic membrane    -  Primary   Will treat with augmentin. Call if not getting better or getting worse. Continue to monitor.    Relevant Medications   amoxicillin-clavulanate (AUGMENTIN) 875-125 MG tablet       Follow up plan: Return if symptoms worsen or fail to improve.

## 2019-12-08 ENCOUNTER — Telehealth: Payer: Self-pay | Admitting: Family Medicine

## 2019-12-08 MED ORDER — AMOXICILLIN 875 MG PO TABS: 875.0000 mg | ORAL_TABLET | Freq: Two times a day (BID) | ORAL | 0 refills | Status: DC

## 2019-12-08 NOTE — Telephone Encounter (Signed)
Copied from [place] [id number]. Topic: General - Other >> Dec 08, 2019  8:00 AM Leward Quan A wrote: Reason for CRM: Patient called to inform Dr Wynetta Emery that the medication amoxicillin-clavulanate (AUGMENTIN) 875-125 MG tablet given for her ear ache is messing with her stomach. Per patient she took it for 5 days but refuse to take it anymore. Asking for something else to be sent to her pharmacy and she can be reached at  Ph# [phone]

## 2019-12-08 NOTE — Telephone Encounter (Signed)
Please confirm that she is taking it with a full meal. It is the best medicine for her ear, so I'd really like it if she can at least get another 2 days in

## 2019-12-08 NOTE — Telephone Encounter (Signed)
Called and spoke with patient, she states that it caused severe diarrhea and abdominal pain and nausea. She said that she stopped it on Saturday, and is now feeling a little better, she can hear out of her ear now, but she is still having some of the bad taste in her mouth.  She would like to know if something else could be called in.   [facility] [place]

## 2019-12-09 ENCOUNTER — Encounter: Payer: Self-pay | Admitting: Family Medicine

## 2019-12-09 MED ORDER — DOXYCYCLINE HYCLATE 100 MG PO TABS: 100.0000 mg | ORAL_TABLET | Freq: Two times a day (BID) | ORAL | 0 refills | Status: DC

## 2019-12-10 ENCOUNTER — Telehealth: Payer: Self-pay | Admitting: Family Medicine

## 2019-12-10 NOTE — Telephone Encounter (Signed)
Pt called and sent mychart message. Pt is aware doxycycline has been sent to her pharm

## 2019-12-16 ENCOUNTER — Ambulatory Visit: Payer: Self-pay | Admitting: General Practice

## 2019-12-16 ENCOUNTER — Telehealth: Payer: Self-pay

## 2019-12-16 ENCOUNTER — Encounter: Payer: Self-pay | Admitting: Family Medicine

## 2019-12-16 DIAGNOSIS — M7711 Lateral epicondylitis, right elbow: Secondary | ICD-10-CM | POA: Diagnosis not present

## 2019-12-16 DIAGNOSIS — R2231 Localized swelling, mass and lump, right upper limb: Secondary | ICD-10-CM | POA: Diagnosis not present

## 2019-12-16 NOTE — Chronic Care Management (AMB) (Signed)
°  Chronic Care Management   Outreach Note  12/16/2019 Name: Marie Parker MRN: [id number] DOB: 12/26/1945  Referred by: Valerie Roys, DO Reason for referral : Chronic Care Management (HLD/CAD/Anxiety- caregiver status- 2nd attempt )   A second unsuccessful telephone outreach was attempted today. The patient was referred to the case management team for assistance with care management and care coordination.   Follow Up Plan: The care management team will reach out to the patient again over the next 30 to 60 days.   Noreene Larsson RN, MSN, [facility] Mobile: [phone]

## 2019-12-17 ENCOUNTER — Telehealth: Payer: Self-pay | Admitting: Family Medicine

## 2019-12-17 ENCOUNTER — Ambulatory Visit (INDEPENDENT_AMBULATORY_CARE_PROVIDER_SITE_OTHER): Payer: Medicare Other | Admitting: Licensed Clinical Social Worker

## 2019-12-17 DIAGNOSIS — I2510 Atherosclerotic heart disease of native coronary artery without angina pectoris: Secondary | ICD-10-CM

## 2019-12-17 DIAGNOSIS — F419 Anxiety disorder, unspecified: Secondary | ICD-10-CM

## 2019-12-17 DIAGNOSIS — I129 Hypertensive chronic kidney disease with stage 1 through stage 4 chronic kidney disease, or unspecified chronic kidney disease: Secondary | ICD-10-CM | POA: Diagnosis not present

## 2019-12-17 DIAGNOSIS — E782 Mixed hyperlipidemia: Secondary | ICD-10-CM | POA: Diagnosis not present

## 2019-12-17 DIAGNOSIS — Z636 Dependent relative needing care at home: Secondary | ICD-10-CM

## 2019-12-17 NOTE — Telephone Encounter (Signed)
Copied from [place] [id number]. Topic: General - Other >> Dec 17, 2019 10:03 AM Keene Breath wrote: Reason for CRM: Patient would like to talk to the nurse regarding the antibiotic she is on.  It is causing diarrhea and she wants to know if there is something else she can take or if she needs to be seen.  CB# [phone]

## 2019-12-17 NOTE — Telephone Encounter (Signed)
If she's getting diarrhea with the doxycyline as well, she needs an appointment as this is the 3rd antibiotic we've put her on.

## 2019-12-17 NOTE — Chronic Care Management (AMB) (Signed)
Chronic Care Management    Clinical Social Work Follow Up Note  12/17/2019 Name: Marie Parker MRN: [id number] DOB: 1946/05/11  Marie Parker is a 74 y.o. year old female who is a primary care patient of Valerie Roys, DO. The CCM team was consulted for assistance with Mental Health Counseling and Resources.   Review of patient status, including review of consultants reports, other relevant assessments, and collaboration with appropriate care team members and the patient's provider was performed as part of comprehensive patient evaluation and provision of chronic care management services.    SDOH (Social Determinants of Health) assessments performed: Yes    Outpatient Encounter Medications as of 12/17/2019  Medication Sig  . acetaminophen (TYLENOL) 650 MG CR tablet Take 1,950 mg by mouth daily as needed for pain.  [name] amLODipine (NORVASC) 10 MG tablet Take 1 tablet (10 mg total) by mouth daily.  [name] amoxicillin (AMOXIL) 875 MG tablet Take 1 tablet (875 mg total) by mouth 2 (two) times daily.  [name] amoxicillin-clavulanate (AUGMENTIN) 875-125 MG tablet Take 1 tablet by mouth 2 (two) times daily.  [name] aspirin 81 MG tablet Take 1 tablet (81 mg total) by mouth daily. (Patient taking differently: Take 81 mg by mouth at bedtime. )  . atenolol (TENORMIN) 50 MG tablet Take 1 tablet (50 mg total) by mouth daily.  [name] atorvastatin (LIPITOR) 40 MG tablet Take 1 tablet (40 mg total) by mouth daily.  . benazepril (LOTENSIN) 40 MG tablet Take 1 tablet (40 mg total) by mouth daily.  [name] CALCIUM PO Take by mouth.  . clobetasol ointment (TEMOVATE) 0.05 % Daily at night for 6-12 weeks, if improvement, decrease to 2-3x per week  . clonazePAM (KLONOPIN) 0.5 MG tablet Take 0.5-1 tablets (0.25-0.5 mg total) by mouth 2 (two) times daily as needed for anxiety.  [name] dexlansoprazole (DEXILANT) 60 MG capsule Take 1 capsule (60 mg total) by mouth daily.  [name] dicyclomine (BENTYL) 10 MG capsule Take 1 capsule (10 mg total) by  mouth 4 (four) times daily -  before meals and at bedtime.  . diphenhydrAMINE (BENADRYL) 25 mg capsule Take 25 mg by mouth at bedtime.   [name] doxycycline (VIBRA-TABS) 100 MG tablet Take 1 tablet (100 mg total) by mouth 2 (two) times daily.  . fluticasone (FLONASE) 50 MCG/ACT nasal spray Place 2 sprays into both nostrils daily.  . hydrALAZINE (APRESOLINE) 25 MG tablet Take 1 tablet (25 mg total) by mouth 2 (two) times daily.  [name] letrozole (FEMARA) 2.5 MG tablet Take 1 tablet (2.5 mg total) by mouth daily.  . niacin 500 MG tablet Take 1,000 mg by mouth at bedtime.  . Probiotic Product (PROBIOTIC-10 PO) Take by mouth daily.  . sertraline (ZOLOFT) 100 MG tablet Take 1 tablet (100 mg total) by mouth daily.  [name] tiZANidine (ZANAFLEX) 4 MG tablet TAKE 1 TABLET(4 MG) BY MOUTH EVERY 6 HOURS AS NEEDED FOR MUSCLE SPASMS  . traMADol (ULTRAM) 50 MG tablet Take 50 mg by mouth every 6 (six) hours.  . Turmeric 500 MG TABS Take 500 mg by mouth daily.   No facility-administered encounter medications on file as of 12/17/2019.     Goals Addressed    . SW-I want to improve my self-care and decrease my anxiety (pt-stated)       Current Barriers:  . Financial constraints . Limited social support . Level of care concerns . Mental Health Concerns  . Social Isolation . Limited access to caregiver- in terms of spouse . Lacks knowledge of  community resource: available personal care service resources and [facility] resource connection within the area  Clinical Social Work Clinical Goal(s):  [name] Over the next 90 days, client will work with SW to address concerns related to reducing anxiety and improving self-care  Interventions: . Patient interviewed and appropriate assessments performed . Provided mental health counseling with regard to managing anxiety. Educated patient on coping methods to implement into his daily life to combat depressive symptoms and stress. Patient denied any suicidal or homicidal ideations. Encouraged  patient to implement deep breathing exercises into her daily routine due to ongoing anxiety. Patient reports having therapy in the past but denies wanting LCSW to make a referral for counseling. Patient was appreciative of the offer but wishes to manage her care without mental health therapy at this time. . Patient reports that since her Zoloft medication was increased by 50 mg, she is able to manage her depressive symptoms a lot more effectively.  . Provided patient with information about available personal care service resources within the area. . CSW used motivational interviewing techniques to engage pt in meaningful conversation and to assess pt's wilingess to improve self-care and decrease her caregiver burnout symptoms. CSW explored pt's coping skills and access to community services.  [name]  LCSW provided reflective listening and implemented appropriate interventions to help suppport patient and her emotional needs  . Patient was receptive to emotional support provided during session . Discussed plans with patient for ongoing care management follow up and provided patient with direct contact information for care management team . Advised patient to contact CCM program for any urgent needs. . Assisted patient/caregiver with obtaining information about health plan benefits . Provided education to patient/caregiver regarding level of care options. . Patient reports that her ear ache medication was giving her adverse side effects and she discontinued medication but is still having symptoms. Patient reports that she has had diarrhea for two weeks now and thought it was getting better but it has returned. Patient reports that she was up all night with symptoms. CCM RNCM notified. Patient states that she will contact PCP office again if symptoms continue or get worse. . Patient reports that overall she is doing better and is gaining much needed socialization to combat her anxiety. Patient reports that she is  currently out with her aunt and that they plan to go out to eat and get their hair done together. Positive reinforcement provided for self-care and socialization.   Patient Self Care Activities:  . Attends all scheduled provider appointments . Calls provider office for new concerns or questions  Please see past updates related to this goal by clicking on the "Past Updates" button in the selected goal      Follow Up Plan: SW will follow up with patient by phone over the next quarter  Eula Fried, [place], MSW, [email or web address] Phone: [phone]

## 2019-12-17 NOTE — Telephone Encounter (Signed)
Patient notified of Dr. Durenda Age message. Appointment scheduled for tomorrow.

## 2019-12-17 NOTE — Telephone Encounter (Signed)
Dr. Wynetta Emery,  What do you advise?

## 2019-12-18 ENCOUNTER — Encounter: Payer: Self-pay | Admitting: Family Medicine

## 2019-12-18 ENCOUNTER — Ambulatory Visit (INDEPENDENT_AMBULATORY_CARE_PROVIDER_SITE_OTHER): Payer: Medicare Other | Admitting: Family Medicine

## 2019-12-18 ENCOUNTER — Other Ambulatory Visit: Payer: Self-pay

## 2019-12-18 VITALS — BP 155/77 | HR 81 | Temp 98.4°F | Wt 136.0 lb

## 2019-12-18 DIAGNOSIS — R197 Diarrhea, unspecified: Secondary | ICD-10-CM

## 2019-12-18 DIAGNOSIS — B370 Candidal stomatitis: Secondary | ICD-10-CM | POA: Diagnosis not present

## 2019-12-18 MED ORDER — NYSTATIN 100000 UNIT/ML MT SUSP: 5.0000 mL | Freq: Four times a day (QID) | OROMUCOSAL | 0 refills | Status: DC

## 2019-12-18 NOTE — Progress Notes (Signed)
BP (!) 155/77 (BP Location: Right Arm, Cuff Size: Normal)   Pulse 81   Temp 98.4 F (36.9 C) (Oral)   Wt 136 lb (61.7 kg)   SpO2 99%   BMI 27.01 kg/m    Subjective:    Patient ID: Marie Parker, female    DOB: 1945/09/07, 74 y.o.   MRN: [id number]  HPI: Marie Parker is a 74 y.o. female  Chief Complaint  Patient presents with  . Diarrhea   ABDOMINAL ISSUES Duration: 2 weeks Nature: cramping and bloating Location: diffuse  Severity: moderate  Radiation: no Episode duration: hours Frequency: intermittent Treatments attempted: none Constipation: no Diarrhea: yes Episodes of diarrhea/day: several times a day, waking her up at night Mucous in the stool: yes Heartburn: yes Bloating:yes Flatulence: yes Nausea: no Vomiting: no Melena or hematochezia: no Rash: no Jaundice: no Fever: no Weight loss: no  Relevant past medical, surgical, family and social history reviewed and updated as indicated. Interim medical history since our last visit reviewed. Allergies and medications reviewed and updated.  Review of Systems  Constitutional: Negative.   Respiratory: Negative.   Cardiovascular: Negative.   Gastrointestinal: Positive for abdominal distention, abdominal pain and diarrhea. Negative for anal bleeding, blood in stool, constipation, nausea, rectal pain and vomiting.  Musculoskeletal: Negative.   Skin: Negative.   Psychiatric/Behavioral: Negative.     Per HPI unless specifically indicated above     Objective:    BP (!) 155/77 (BP Location: Right Arm, Cuff Size: Normal)   Pulse 81   Temp 98.4 F (36.9 C) (Oral)   Wt 136 lb (61.7 kg)   SpO2 99%   BMI 27.01 kg/m   Wt Readings from Last 3 Encounters:  12/18/19 136 lb (61.7 kg)  12/02/19 135 lb (61.2 kg)  07/31/19 133 lb 2 oz (60.4 kg)    Physical Exam Vitals and nursing note reviewed.  Constitutional:      General: She is not in acute distress.    Appearance: Normal appearance. She is not  ill-appearing, toxic-appearing or diaphoretic.  HENT:     Head: Normocephalic and atraumatic.     Right Ear: External ear normal.     Left Ear: External ear normal.     Nose: Nose normal.     Mouth/Throat:     Mouth: Mucous membranes are moist.     Pharynx: Oropharynx is clear.  Eyes:     General: No scleral icterus.       Right eye: No discharge.        Left eye: No discharge.     Extraocular Movements: Extraocular movements intact.     Conjunctiva/sclera: Conjunctivae normal.     Pupils: Pupils are equal, round, and reactive to light.  Cardiovascular:     Rate and Rhythm: Normal rate and regular rhythm.     Pulses: Normal pulses.     Heart sounds: Normal heart sounds. No murmur. No friction rub. No gallop.   Pulmonary:     Effort: Pulmonary effort is normal. No respiratory distress.     Breath sounds: Normal breath sounds. No stridor. No wheezing, rhonchi or rales.  Chest:     Chest wall: No tenderness.  Abdominal:     General: Abdomen is flat. Bowel sounds are normal. There is no distension.     Palpations: Abdomen is soft. There is no mass.     Tenderness: There is no abdominal tenderness. There is no right CVA tenderness, left CVA tenderness, guarding or rebound.  Hernia: No hernia is present.  Musculoskeletal:        General: Normal range of motion.     Cervical back: Normal range of motion and neck supple.  Skin:    General: Skin is warm and dry.     Capillary Refill: Capillary refill takes less than 2 seconds.     Coloration: Skin is not jaundiced or pale.     Findings: No bruising, erythema, lesion or rash.  Neurological:     General: No focal deficit present.     Mental Status: She is alert and oriented to person, place, and time. Mental status is at baseline.  Psychiatric:        Mood and Affect: Mood normal.        Behavior: Behavior normal.        Thought Content: Thought content normal.        Judgment: Judgment normal.     Results for orders placed  or performed in visit on 10/10/19  Comprehensive metabolic panel  Result Value Ref Range   Glucose 90 65 - 99 mg/dL   BUN 8 8 - 27 mg/dL   Creatinine, Ser 0.84 0.57 - 1.00 mg/dL   GFR calc non Af Amer 70 >59 mL/min/1.73   GFR calc Af Amer 80 >59 mL/min/1.73   BUN/Creatinine Ratio 10 (L) 12 - 28   Sodium 140 134 - 144 mmol/L   Potassium 4.0 3.5 - 5.2 mmol/L   Chloride 98 96 - 106 mmol/L   CO2 27 20 - 29 mmol/L   Calcium 9.3 8.7 - 10.3 mg/dL   Total Protein 6.4 6.0 - 8.5 g/dL   Albumin 4.0 3.7 - 4.7 g/dL   Globulin, Total 2.4 1.5 - 4.5 g/dL   Albumin/Globulin Ratio 1.7 1.2 - 2.2   Bilirubin Total 0.3 0.0 - 1.2 mg/dL   Alkaline Phosphatase 73 39 - 117 IU/L   AST 19 0 - 40 IU/L   ALT 17 0 - 32 IU/L  CBC with Differential/Platelet  Result Value Ref Range   WBC 6.7 3.4 - 10.8 x10E3/uL   RBC 4.33 3.77 - 5.28 x10E6/uL   Hemoglobin 13.4 11.1 - 15.9 g/dL   Hematocrit 40.2 34.0 - 46.6 %   MCV 93 79 - 97 fL   MCH 30.9 26.6 - 33.0 pg   MCHC 33.3 31.5 - 35.7 g/dL   RDW 13.8 11.7 - 15.4 %   Platelets 211 150 - 450 x10E3/uL   Neutrophils 69 Not Estab. %   Lymphs 21 Not Estab. %   Monocytes 8 Not Estab. %   Eos 2 Not Estab. %   Basos 0 Not Estab. %   Neutrophils Absolute 4.6 1.4 - 7.0 x10E3/uL   Lymphocytes Absolute 1.4 0.7 - 3.1 x10E3/uL   Monocytes Absolute 0.5 0.1 - 0.9 x10E3/uL   EOS (ABSOLUTE) 0.1 0.0 - 0.4 x10E3/uL   Basophils Absolute 0.0 0.0 - 0.2 x10E3/uL   Immature Granulocytes 0 Not Estab. %   Immature Grans (Abs) 0.0 0.0 - 0.1 x10E3/uL  Lipid Panel w/o Chol/HDL Ratio  Result Value Ref Range   Cholesterol, Total 138 100 - 199 mg/dL   Triglycerides 153 (H) 0 - 149 mg/dL   HDL 56 >39 mg/dL   VLDL Cholesterol Cal 26 5 - 40 mg/dL   LDL Chol Calc (NIH) 56 0 - 99 mg/dL  Microalbumin, Urine Waived  Result Value Ref Range   Microalb, Ur Waived 10 0 - 19 mg/L   Creatinine, Urine Waived  10 10 - 300 mg/dL   Microalb/Creat Ratio <30 <30 mg/g  TSH  Result Value Ref Range   TSH  1.860 0.450 - 4.500 uIU/mL  Uric acid  Result Value Ref Range   Uric Acid 5.6 3.1 - 7.9 mg/dL      Assessment & Plan:   Problem List Items Addressed This Visit    None    Visit Diagnoses    Diarrhea of presumed infectious origin    -  Primary   Concern for c. diff. Will check stool studies. Await results. Treat as needed. Will hold antibiotics until results back.   Relevant Orders   Ova and parasite examination   Fecal occult blood, imunochemical   Stool C-Diff Toxin Assay   Stool Culture   Fecal leukocytes   Thrush       Will treat with nystatin. Call with any concerns. Continue to monitor.    Relevant Medications   nystatin (MYCOSTATIN) 100000 UNIT/ML suspension       Follow up plan: Return if symptoms worsen or fail to improve.

## 2019-12-18 NOTE — Patient Instructions (Addendum)
Food Choices to Help Relieve Diarrhea, Adult When you have diarrhea, the foods you eat and your eating habits are very important. Choosing the right foods and drinks can help:  Relieve diarrhea.  Replace lost fluids and nutrients.  Prevent dehydration. What general guidelines should I follow?  Relieving diarrhea  Choose foods with less than 2 g or .07 oz. of fiber per serving.  Limit fats to less than 8 tsp (38 g or 1.34 oz.) a day.  Avoid the following: ? Foods and beverages sweetened with high-fructose corn syrup, honey, or sugar alcohols such as xylitol, sorbitol, and mannitol. ? Foods that contain a lot of fat or sugar. ? Fried, greasy, or spicy foods. ? High-fiber grains, breads, and cereals. ? Raw fruits and vegetables.  Eat foods that are rich in probiotics. These foods include dairy products such as yogurt and fermented milk products. They help increase healthy bacteria in the stomach and intestines (gastrointestinal tract, or GI tract).  If you have lactose intolerance, avoid dairy products. These may make your diarrhea worse.  Take medicine to help stop diarrhea (antidiarrheal medicine) only as told by your health care provider. Replacing nutrients  Eat small meals or snacks every 3-4 hours.  Eat bland foods, such as white rice, toast, or baked potato, until your diarrhea starts to get better. Gradually reintroduce nutrient-rich foods as tolerated or as told by your health care provider. This includes: ? Well-cooked protein foods. ? Peeled, seeded, and soft-cooked fruits and vegetables. ? Low-fat dairy products.  Take vitamin and mineral supplements as told by your health care provider. Preventing dehydration  Start by sipping water or a special solution to prevent dehydration (oral rehydration solution, ORS). Urine that is clear or pale yellow means that you are getting enough fluid.  Try to drink at least 8-10 cups of fluid each day to help replace lost  fluids.  You may add other liquids in addition to water, such as clear juice or decaffeinated sports drinks, as tolerated or as told by your health care provider.  Avoid drinks with caffeine, such as coffee, tea, or soft drinks.  Avoid alcohol. What foods are recommended?     The items listed may not be a complete list. Talk with your health care provider about what dietary choices are best for you. Grains White rice. White, French, or pita breads (fresh or toasted), including plain rolls, buns, or bagels. White pasta. Saltine, soda, or graham crackers. Pretzels. Low-fiber cereal. Cooked cereals made with water (such as cornmeal, farina, or cream cereals). Plain muffins. Matzo. Melba toast. Zwieback. Vegetables Potatoes (without the skin). Most well-cooked and canned vegetables without skins or seeds. Tender lettuce. Fruits Apple sauce. Fruits canned in juice. Cooked apricots, cherries, grapefruit, peaches, pears, or plums. Fresh bananas and cantaloupe. Meats and other protein foods Baked or boiled chicken. Eggs. Tofu. Fish. Seafood. Smooth nut butters. Ground or well-cooked tender beef, ham, veal, lamb, pork, or poultry. Dairy Plain yogurt, kefir, and unsweetened liquid yogurt. Lactose-free milk, buttermilk, skim milk, or soy milk. Low-fat or nonfat hard cheese. Beverages Water. Low-calorie sports drinks. Fruit juices without pulp. Strained tomato and vegetable juices. Decaffeinated teas. Sugar-free beverages not sweetened with sugar alcohols. Oral rehydration solutions, if approved by your health care provider. Seasoning and other foods Bouillon, broth, or soups made from recommended foods. What foods are not recommended? The items listed may not be a complete list. Talk with your health care provider about what dietary choices are best for you. Grains Whole   grain, whole wheat, bran, or rye breads, rolls, pastas, and crackers. Wild or brown rice. Whole grain or bran cereals. Barley.  Oats and oatmeal. Corn tortillas or taco shells. Granola. Popcorn. Vegetables Raw vegetables. Fried vegetables. Cabbage, broccoli, Brussels sprouts, artichokes, baked beans, beet greens, corn, kale, legumes, peas, sweet potatoes, and yams. Potato skins. Cooked spinach and cabbage. Fruits Dried fruit, including raisins and dates. Raw fruits. Stewed or dried prunes. Canned fruits with syrup. Meat and other protein foods Fried or fatty meats. Deli meats. Chunky nut butters. Nuts and seeds. Beans and lentils. Bacon. Hot dogs. Sausage. Dairy High-fat cheeses. Whole milk, chocolate milk, and beverages made with milk, such as milk shakes. Half-and-half. Cream. sour cream. Ice cream. Beverages Caffeinated beverages (such as coffee, tea, soda, or energy drinks). Alcoholic beverages. Fruit juices with pulp. Prune juice. Soft drinks sweetened with high-fructose corn syrup or sugar alcohols. High-calorie sports drinks. Fats and oils Butter. Cream sauces. Margarine. Salad oils. Plain salad dressings. Olives. Avocados. Mayonnaise. Sweets and desserts Sweet rolls, doughnuts, and sweet breads. Sugar-free desserts sweetened with sugar alcohols such as xylitol and sorbitol. Seasoning and other foods Honey. Hot sauce. Chili powder. Gravy. Cream-based or milk-based soups. Pancakes and waffles. Summary  When you have diarrhea, the foods you eat and your eating habits are very important.  Make sure you get at least 8-10 cups of fluid each day, or enough to keep your urine clear or pale yellow.  Eat bland foods and gradually reintroduce healthy, nutrient-rich foods as tolerated, or as told by your health care provider.  Avoid high-fiber, fried, greasy, or spicy foods. This information is not intended to replace advice given to you by your health care provider. Make sure you discuss any questions you have with your health care provider. Document Revised: 12/05/2018 Document Reviewed: 08/11/2016 Elsevier Patient  Education  2020 [employer]  

## 2019-12-19 ENCOUNTER — Other Ambulatory Visit: Payer: Medicare Other

## 2019-12-19 DIAGNOSIS — R197 Diarrhea, unspecified: Secondary | ICD-10-CM | POA: Diagnosis not present

## 2019-12-19 NOTE — Addendum Note (Signed)
Addended by: Delton Prairie on: 12/19/2019 09:15 AM   Modules accepted: Orders

## 2019-12-20 LAB — FECAL OCCULT BLOOD, IMMUNOCHEMICAL: Fecal Occult Bld: POSITIVE — AB

## 2019-12-21 LAB — CLOSTRIDIUM DIFFICILE EIA: C difficile Toxins A+B, EIA: NEGATIVE

## 2019-12-22 ENCOUNTER — Other Ambulatory Visit: Payer: Self-pay | Admitting: Family Medicine

## 2019-12-22 ENCOUNTER — Ambulatory Visit
Admission: RE | Admit: 2019-12-22 | Discharge: 2019-12-22 | Disposition: A | Discharge status: Home / Self Care | Payer: Medicare Other | Source: Ambulatory Visit | Attending: Family Medicine | Admitting: Family Medicine

## 2019-12-22 ENCOUNTER — Other Ambulatory Visit
Admission: RE | Admit: 2019-12-22 | Discharge: 2019-12-22 | Disposition: A | Discharge status: Home / Self Care | Payer: Medicare Other | Source: Home / Self Care | Attending: Family Medicine | Admitting: Family Medicine

## 2019-12-22 ENCOUNTER — Other Ambulatory Visit: Payer: Self-pay

## 2019-12-22 DIAGNOSIS — R195 Other fecal abnormalities: Secondary | ICD-10-CM

## 2019-12-22 DIAGNOSIS — N289 Disorder of kidney and ureter, unspecified: Secondary | ICD-10-CM | POA: Diagnosis not present

## 2019-12-22 DIAGNOSIS — K5289 Other specified noninfective gastroenteritis and colitis: Secondary | ICD-10-CM | POA: Diagnosis not present

## 2019-12-22 DIAGNOSIS — R197 Diarrhea, unspecified: Secondary | ICD-10-CM

## 2019-12-22 DIAGNOSIS — I700 Atherosclerosis of aorta: Secondary | ICD-10-CM | POA: Insufficient documentation

## 2019-12-22 DIAGNOSIS — N2889 Other specified disorders of kidney and ureter: Secondary | ICD-10-CM | POA: Diagnosis not present

## 2019-12-22 LAB — CREATININE, SERUM
Creatinine, Ser: 0.98 mg/dL (ref 0.44–1.00)
GFR calc Af Amer: >60 mL/min (ref >60)
GFR calc non Af Amer: 58 mL/min — ABNORMAL LOW (ref >60)

## 2019-12-22 MED ORDER — IOHEXOL 300 MG/ML  SOLN
100.0000 mL | Freq: Once | INTRAMUSCULAR | Status: AC | PRN
  Administered 2019-12-22: 100 mL via INTRAVENOUS

## 2019-12-22 MED ORDER — METRONIDAZOLE 500 MG PO TABS: 500.0000 mg | ORAL_TABLET | Freq: Two times a day (BID) | ORAL | 0 refills | Status: DC

## 2019-12-22 NOTE — Telephone Encounter (Signed)
-----   Message from Valerie Roys, DO sent at 12/22/2019  9:43 AM EDT ----- Please make sure she's aware of the below- if still having a lot of diarrhea, may need CT to r/o diverticulitis

## 2019-12-22 NOTE — Progress Notes (Signed)
cr

## 2019-12-25 ENCOUNTER — Telehealth: Payer: Self-pay | Admitting: Family Medicine

## 2019-12-25 ENCOUNTER — Encounter: Payer: Self-pay | Admitting: Family Medicine

## 2019-12-25 DIAGNOSIS — R197 Diarrhea, unspecified: Secondary | ICD-10-CM

## 2019-12-25 DIAGNOSIS — R195 Other fecal abnormalities: Secondary | ICD-10-CM

## 2019-12-25 LAB — STOOL CULTURE: E coli, Shiga toxin Assay: NEGATIVE

## 2019-12-25 LAB — FECAL LEUKOCYTES

## 2019-12-25 NOTE — Telephone Encounter (Signed)
Can we please check in on how she's doing with her diarrhea? Thanks!

## 2019-12-25 NOTE — Telephone Encounter (Signed)
Called and spoke with patient, she states that the diarrhea is better, not completely gone but better.

## 2019-12-25 NOTE — Telephone Encounter (Signed)
Patient is wanting to have a referral to GI, prefers [place]

## 2019-12-29 NOTE — Telephone Encounter (Signed)
Called to discuss stomach issues. Stools more formed. Feeling better. Talking to GI tomorrow. Call with any concerns.

## 2019-12-30 ENCOUNTER — Telehealth: Payer: Medicare Other

## 2019-12-30 LAB — O+P EXAM, FORMALIN ONLY

## 2019-12-31 LAB — OVA AND PARASITE EXAMINATION

## 2019-12-31 LAB — SPECIMEN STATUS REPORT

## 2020-01-07 NOTE — Telephone Encounter (Signed)
Patient notified and verbalized understanding.  Stated she was out imodium but was going to go get some. Understood that she would go to UC if symptoms worsened. Stated she's going often, but it isn't much. Thinks her diet has a lot to do with things right now. But still wanted to know if she should be seen before her appointment with GI because she's been going so much. (GI said June 16th is earliest she could get in). Explained Dr. Wynetta Emery would review and someone would get back to her.

## 2020-01-07 NOTE — Telephone Encounter (Signed)
Patient called in asking if something can be given to her for the diarrhea, as she states she is still having it weeks later. Patient stated GI cannot see her till 15th and wants something to hold her till then. Please advise.

## 2020-01-07 NOTE — Telephone Encounter (Signed)
Looks like she's already taking imodium prn, which is probably the most helpful thing for her symptoms right now. May need to be seen tomorrow or tonight at [facility] if worsening. Will also leave for Dr. Wynetta Emery to review in the morning

## 2020-01-08 ENCOUNTER — Encounter: Payer: Self-pay | Admitting: Family Medicine

## 2020-01-09 NOTE — Telephone Encounter (Signed)
[facility], they are closed, will try again Monday.

## 2020-01-09 NOTE — Telephone Encounter (Signed)
Can we reach out to [facility] and see if anyone can see her sooner?

## 2020-01-12 DIAGNOSIS — M7711 Lateral epicondylitis, right elbow: Secondary | ICD-10-CM | POA: Diagnosis not present

## 2020-01-13 ENCOUNTER — Other Ambulatory Visit: Payer: Self-pay

## 2020-01-13 ENCOUNTER — Encounter: Payer: Self-pay | Admitting: Gastroenterology

## 2020-01-13 ENCOUNTER — Ambulatory Visit: Payer: Medicare Other | Admitting: Gastroenterology

## 2020-01-13 VITALS — BP 155/93 | HR 68 | Temp 98.4°F | Ht 59.0 in | Wt 133.2 lb

## 2020-01-13 DIAGNOSIS — F411 Generalized anxiety disorder: Secondary | ICD-10-CM | POA: Diagnosis not present

## 2020-01-13 DIAGNOSIS — D126 Benign neoplasm of colon, unspecified: Secondary | ICD-10-CM

## 2020-01-13 DIAGNOSIS — K219 Gastro-esophageal reflux disease without esophagitis: Secondary | ICD-10-CM | POA: Diagnosis not present

## 2020-01-13 DIAGNOSIS — Z86010 Personal history of colonic polyps: Secondary | ICD-10-CM

## 2020-01-13 MED ORDER — NA SULFATE-K SULFATE-MG SULF 17.5-3.13-1.6 GM/177ML PO SOLN: 354.0000 mL | Freq: Once | ORAL | 0 refills | Status: AC

## 2020-01-13 MED ORDER — OMEPRAZOLE 40 MG PO CPDR: 40.0000 mg | DELAYED_RELEASE_CAPSULE | Freq: Every day | ORAL | 0 refills | Status: DC

## 2020-01-13 MED ORDER — AMITRIPTYLINE HCL 25 MG PO TABS: 25.0000 mg | ORAL_TABLET | Freq: Every day | ORAL | 0 refills | Status: DC

## 2020-01-13 NOTE — Progress Notes (Signed)
Cephas Darby, MD [address]  [place]  [place], [place] [address]  Main: [phone]  Fax: [phone]    Gastroenterology Consultation  Referring Provider:     Valerie Roys, DO Primary Care Physician:  Valerie Roys, DO Primary Gastroenterologist:  Dr. Cephas Darby Reason for Consultation:     Acid reflux, personal history of tubular adenomas of colon        HPI:   Quanisha Drewry is a 74 y.o. female referred by Dr. Wynetta Emery, Barb Merino, DO  for consultation & management of acid reflux.  Patient reports that she has chronic history of intermittent heartburn, however within last 6 months to 1 year, she has been going through a lot of anxiety and stress.  She reports heartburn, including nocturnal symptoms associated with abdominal bloating, epigastric pain, regurgitation.  She used to take Protonix in the past, due to worsening of symptoms this was switched to Gibbs.  She could not afford due to high co-pay.  She is currently on Protonix 40 mg twice daily with partial relief.  She avoids coffee, chocolates and other triggers to help with her symptoms.  She denies any dysphagia.  She reports that Zantac really worked well in the past.  Patient had ear infection more than a month ago for which she was treated with antibiotics that led to diarrhea, persistent for 5 weeks.  C. difficile was negative.  She is no longer experiencing diarrhea.  She reports having 2-3 formed bowel movements which is her baseline.  She denies rectal bleeding, weight loss.  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  Review of labs revealed normal CBC, CMP  Patient underwent CT abdomen pelvis when she had bout of diarrhea which revealed mild pancolitis  She does not smoke or drink alcohol  NSAIDs: None  Antiplts/Anticoagulants/Anti thrombotics: None  GI Procedures:  Upper endoscopy  12/15/2012 Diagnosis:  Part A: ANTRUM COLD BIOPSY:  - ANTRAL MUCOSA WITH MARKED REACTIVE GASTROPATHY.  - NO ACTIVE INFLAMMATION, ATROPHY, OR INTESTINAL METAPLASIA.  - NEGATIVE FOR HELICOBACTER PYLORI ON DIFF-QUIK STAIN.  [name]  Part B: BODY OF STOMACH COLD BIOPSY:  - OXYNTIC MUCOSA, NO PATHOLOGIC CHANGES.  .  Part C: GASTROESOPHAGEAL JUNCTION COLD BIOPSY:  - SQUAMOUS EPITHELIUM WITH RARE INTRAEPITHELIAL EOSINOPHILS  COMPATIBLE WITH MINIMAL REFLUX ESOPHAGITIS.  - COLUMNAR-LINED MUCOSA WITH MILD CHRONIC INFLAMMATION.  - NEGATIVE FOR GOBLET CELLS, DYSPLASIA, AND MALIGNANCY.   Colonoscopy 07/22/2012  Pathology revealed tubular adenomas of colon  Past Medical History:  Diagnosis Date  . Anxiety   . Arthritis   . Breast cancer of upper-outer quadrant of left female breast ([facility]) 04/22/2018   16 mm, T1c, N0.  ER/PR positive, HER-2/neu not overexpressed.  Low MammaPrint score.    [name] CAD (coronary artery disease)   . Cancer (HCC)    squamous cell  . Cataract   . Cholecystitis 04/10/2018  . Chronic kidney disease   . Colon cancer screening 2017?   negative cologuard  . Depression   . Family history of adverse reaction to anesthesia    mother had N/V  . Generalized osteoarthritis   . GERD (gastroesophageal reflux disease)   . Gout   . History of endoscopy 11/2012   Gastritis  . Hyperlipidemia   . Hypertension   . IFG (impaired fasting glucose)   . Insomnia   . Menopause   . Osteoporosis   .  Parathyroid adenoma   . Personal history of radiation therapy 2020   Left breast  . Personal history of tobacco use, presenting hazards to health 02/02/2016  . PVD (peripheral vascular disease) ([facility])   . PVD (peripheral vascular disease) ([facility])   . Rotator cuff syndrome of shoulder and allied disorders   . Tobacco abuse     Past Surgical History:  Procedure Laterality Date  . BREAST BIOPSY Left 2014   COMPLEX FIBROADENOMA  . BREAST BIOPSY Left 04/05/2018   [facility]  . BREAST EXCISIONAL BIOPSY  Left 1970's   neg  . BREAST FIBROADENOMA SURGERY    . BREAST MAMMOSITE Left 04/2018  . CARDIAC CATHETERIZATION  09/17/2007   Insignificant CAD with 40-50% stenosis of mid LAD.   [name] ILIAC ARTERY STENT     right iliac artery stent   . MASTECTOMY, PARTIAL Left 04/22/2018   Procedure: MASTECTOMY PARTIAL;  Surgeon: Robert Bellow, MD;  Location: [facility] ORS;  Service: General;  Laterality: Left;  . ORIF PATELLA Left 03/22/2016   Dr. Tristan Schroeder  . PARATHYROIDECTOMY  2012   [facility]  . SENTINEL NODE BIOPSY Left 04/22/2018   Procedure: SENTINEL NODE BIOPSY;  Surgeon: Robert Bellow, MD;  Location: [facility] ORS;  Service: General;  Laterality: Left;  . SKIN BIOPSY     precancerous    Prior to Admission medications   Medication Sig Start Date End Date Taking? Authorizing Provider  acetaminophen (TYLENOL) 650 MG CR tablet Take 1,950 mg by mouth daily as needed for pain.    [provider]  amLODipine (NORVASC) 10 MG tablet Take 1 tablet (10 mg total) by mouth daily. 10/10/19   Park Liter P, DO  aspirin 81 MG tablet Take 1 tablet (81 mg total) by mouth daily. Patient taking differently: Take 81 mg by mouth at bedtime.  12/27/17   Johnson, Megan P, DO  atenolol (TENORMIN) 50 MG tablet Take 1 tablet (50 mg total) by mouth daily. 10/10/19   Johnson, Megan P, DO  atorvastatin (LIPITOR) 40 MG tablet Take 1 tablet (40 mg total) by mouth daily. 10/10/19   Johnson, Megan P, DO  benazepril (LOTENSIN) 40 MG tablet Take 1 tablet (40 mg total) by mouth daily. 10/10/19   Johnson, Megan P, DO  CALCIUM PO Take by mouth.    [provider]  clobetasol ointment (TEMOVATE) 0.05 % Daily at night for 6-12 weeks, if improvement, decrease to 2-3x per week 09/04/19   Park Liter P, DO  clonazePAM (KLONOPIN) 0.5 MG tablet Take 0.5-1 tablets (0.25-0.5 mg total) by mouth 2 (two) times daily as needed for anxiety. 10/10/19   Johnson, Megan P, DO  dexlansoprazole (DEXILANT) 60 MG capsule Take 1 capsule (60 mg total) by  mouth daily. 12/02/19   Johnson, Megan P, DO  dicyclomine (BENTYL) 10 MG capsule Take 1 capsule (10 mg total) by mouth 4 (four) times daily -  before meals and at bedtime. 09/04/19   Park Liter P, DO  diphenhydrAMINE (BENADRYL) 25 mg capsule Take 25 mg by mouth at bedtime.     [provider]  fluticasone (FLONASE) 50 MCG/ACT nasal spray Place 2 sprays into both nostrils daily. 10/10/19   Park Liter P, DO  hydrALAZINE (APRESOLINE) 25 MG tablet Take 1 tablet (25 mg total) by mouth 2 (two) times daily. 10/10/19   Johnson, Megan P, DO  letrozole (FEMARA) 2.5 MG tablet Take 1 tablet (2.5 mg total) by mouth daily. 05/21/19   Noreene Filbert, MD  Loperamide HCl (IMODIUM  PO) Take by mouth.    [provider]  metroNIDAZOLE (FLAGYL) 500 MG tablet Take 1 tablet (500 mg total) by mouth 2 (two) times daily. 12/22/19   Park Liter P, DO  niacin 500 MG tablet Take 1,000 mg by mouth at bedtime.    [provider]  nystatin (MYCOSTATIN) 100000 UNIT/ML suspension Take 5 mLs (500,000 Units total) by mouth 4 (four) times daily. 12/18/19   Johnson, Megan P, DO  pantoprazole (PROTONIX) 40 MG tablet Take 40 mg by mouth daily. 12/29/19   [provider]  Probiotic Product (PROBIOTIC-10 PO) Take by mouth daily.    [provider]  sertraline (ZOLOFT) 100 MG tablet Take 1 tablet (100 mg total) by mouth daily. 10/10/19   Johnson, Megan P, DO  tiZANidine (ZANAFLEX) 4 MG tablet TAKE 1 TABLET(4 MG) BY MOUTH EVERY 6 HOURS AS NEEDED FOR MUSCLE SPASMS 12/27/17   Johnson, Megan P, DO  traMADol (ULTRAM) 50 MG tablet Take 50 mg by mouth every 6 (six) hours. 11/21/19   [provider]  Turmeric 500 MG TABS Take 500 mg by mouth daily.    [provider]    Current Outpatient Medications:  .  acetaminophen (TYLENOL) 650 MG CR tablet, Take 1,950 mg by mouth daily as needed for pain., Disp: , Rfl:  .  amLODipine (NORVASC) 10 MG tablet, Take 1 tablet (10 mg total) by mouth  daily., Disp: 90 tablet, Rfl: 1 .  aspirin 81 MG tablet, Take 1 tablet (81 mg total) by mouth daily. (Patient taking differently: Take 81 mg by mouth at bedtime. ), Disp: 90 tablet, Rfl: 3 .  atenolol (TENORMIN) 50 MG tablet, Take 1 tablet (50 mg total) by mouth daily., Disp: 90 tablet, Rfl: 1 .  atorvastatin (LIPITOR) 40 MG tablet, Take 1 tablet (40 mg total) by mouth daily., Disp: 90 tablet, Rfl: 1 .  benazepril (LOTENSIN) 40 MG tablet, Take 1 tablet (40 mg total) by mouth daily., Disp: 90 tablet, Rfl: 1 .  CALCIUM PO, Take by mouth., Disp: , Rfl:  .  clobetasol ointment (TEMOVATE) 0.05 %, Daily at night for 6-12 weeks, if improvement, decrease to 2-3x per week, Disp: 60 g, Rfl: 1 .  clonazePAM (KLONOPIN) 0.5 MG tablet, Take 0.5-1 tablets (0.25-0.5 mg total) by mouth 2 (two) times daily as needed for anxiety., Disp: 30 tablet, Rfl: 0 .  diphenhydrAMINE (BENADRYL) 25 mg capsule, Take 25 mg by mouth at bedtime. , Disp: , Rfl:  .  fluticasone (FLONASE) 50 MCG/ACT nasal spray, Place 2 sprays into both nostrils daily., Disp: 48 g, Rfl: 12 .  hydrALAZINE (APRESOLINE) 25 MG tablet, Take 1 tablet (25 mg total) by mouth 2 (two) times daily., Disp: 180 tablet, Rfl: 1 .  letrozole (FEMARA) 2.5 MG tablet, Take 1 tablet (2.5 mg total) by mouth daily., Disp: 30 tablet, Rfl: 12 .  Loperamide HCl (IMODIUM PO), Take by mouth., Disp: , Rfl:  .  niacin 500 MG tablet, Take 1,000 mg by mouth at bedtime., Disp: , Rfl:  .  nystatin (MYCOSTATIN) 100000 UNIT/ML suspension, Take 5 mLs (500,000 Units total) by mouth 4 (four) times daily., Disp: 60 mL, Rfl: 0 .  Probiotic Product (PROBIOTIC-10 PO), Take by mouth daily., Disp: , Rfl:  .  sertraline (ZOLOFT) 100 MG tablet, Take 1 tablet (100 mg total) by mouth daily., Disp: 90 tablet, Rfl: 1 .  tiZANidine (ZANAFLEX) 4 MG tablet, TAKE 1 TABLET(4 MG) BY MOUTH EVERY 6 HOURS AS NEEDED FOR MUSCLE  SPASMS, Disp: 30 tablet, Rfl: 3 .  traMADol (ULTRAM) 50 MG tablet, Take 50 mg by  mouth every 6 (six) hours., Disp: , Rfl:  .  Turmeric 500 MG TABS, Take 500 mg by mouth daily., Disp: , Rfl:  .  amitriptyline (ELAVIL) 25 MG tablet, Take 1 tablet (25 mg total) by mouth at bedtime., Disp: 30 tablet, Rfl: 0 .  Na Sulfate-K Sulfate-Mg Sulf 17.5-3.13-1.6 GM/177ML SOLN, Take 354 mLs by mouth once for 1 dose., Disp: 354 mL, Rfl: 0 .  omeprazole (PRILOSEC) 40 MG capsule, Take 1 capsule (40 mg total) by mouth daily before breakfast., Disp: 30 capsule, Rfl: 0   Family History  Problem Relation Age of Onset  . Heart attack Father 76  . Alcohol abuse Father   . Heart disease Father   . Liver disease Father   . Hypertension Father   . Heart disease Sister 61       CAD; s/p stent placement  . Stroke Sister   . AAA (abdominal aortic aneurysm) Sister   . Dementia Sister   . Stroke Mother   . Hypertension Mother   . Diabetes Mother   . Stroke Maternal Grandmother   . Heart attack Maternal Grandfather   . Stroke Paternal Grandmother   . Breast cancer Maternal Aunt 65  . Breast cancer Paternal Aunt   . Bone cancer Paternal Uncle      Social History   Tobacco Use  . Smoking status: Former Smoker    Packs/day: 1.00    Years: 42.00    Pack years: 42.00    Types: Cigarettes    Quit date: 08/28/2000    Years since quitting: 19.3  . Smokeless tobacco: Never Used  Substance Use Topics  . Alcohol use: No  . Drug use: No    Allergies as of 01/13/2020 - Review Complete 01/13/2020  Allergen Reaction Noted  . Amoxicillin-pot clavulanate Diarrhea 12/02/2019  . Crestor [rosuvastatin] Nausea Only 01/21/2013  . Cyclobenzaprine  03/24/2015  . Erythromycin  01/21/2013  . Fosamax [alendronate sodium] Nausea And Vomiting 01/21/2013    Review of Systems:    All systems reviewed and negative except where noted in HPI.   Physical Exam:  BP (!) 155/93 (BP Location: Left Arm, Patient Position: Sitting, Cuff Size: Normal)   Pulse 68   Temp 98.4 F (36.9 C) (Oral)   Ht 4' 11"   (1.499 m)   Wt 133 lb 4 oz (60.4 kg)   BMI 26.91 kg/m  No LMP recorded. Patient is postmenopausal.  General:   Alert,  Well-developed, well-nourished, pleasant and cooperative in NAD Head:  Normocephalic and atraumatic. Eyes:  Sclera clear, no icterus.   Conjunctiva pink. Ears:  Normal auditory acuity. Nose:  No deformity, discharge, or lesions. Mouth:  No deformity or lesions,oropharynx pink & moist. Neck:  Supple; no masses or thyromegaly. Lungs:  Respirations even and unlabored.  Clear throughout to auscultation.   No wheezes, crackles, or rhonchi. No acute distress. Heart:  Regular rate and rhythm; no murmurs, clicks, rubs, or gallops. Abdomen:  Normal bowel sounds. Soft, non-tender and non-distended without masses, hepatosplenomegaly or hernias noted.  No guarding or rebound tenderness.   Rectal: Not performed Msk:  Symmetrical without gross deformities. Good, equal movement & strength bilaterally. Pulses:  Normal pulses noted. Extremities:  No clubbing or edema.  No cyanosis. Neurologic:  Alert and oriented x3;  grossly normal neurologically. Skin:  Intact without significant lesions or rashes. No jaundice. Psych:  Alert and cooperative.  Normal mood and affect.  Imaging Studies: Reviewed  Assessment and Plan:   Shenoa Hattabaugh is a 74 y.o. female with history of anxiety, breast cancer survivor, hypertension, hyperlipidemia, generalized anxiety is seen in consultation for poorly controlled GERD and surveillance colonoscopy  Chronic GERD Recommend to switch to omeprazole 40 mg daily before breakfast or dinner Discussed about antireflux lifestyle, information provided Recommend EGD with esophageal and gastric biopsies for further evaluation  Personal history of tubular adenomas of the colon Recommend surveillance colonoscopy with pediatric colonoscope  Generalized anxiety Likely contributing to worsening of reflux symptoms Trial of amitriptyline 25 mg at bedtime in  addition to Zoloft   Follow up in 3 months   Cephas Darby, MD

## 2020-01-13 NOTE — Telephone Encounter (Signed)
Fabulous!

## 2020-01-13 NOTE — Telephone Encounter (Signed)
[facility] had a cancellation, patient is going at 10:00 am today

## 2020-01-13 NOTE — Patient Instructions (Signed)

## 2020-01-14 ENCOUNTER — Encounter: Payer: Self-pay | Admitting: Family Medicine

## 2020-01-14 ENCOUNTER — Telehealth: Payer: Self-pay | Admitting: Family Medicine

## 2020-01-14 ENCOUNTER — Telehealth (INDEPENDENT_AMBULATORY_CARE_PROVIDER_SITE_OTHER): Payer: Medicare Other | Admitting: Family Medicine

## 2020-01-14 VITALS — BP 139/67 | HR 67 | Ht 59.5 in | Wt 133.0 lb

## 2020-01-14 DIAGNOSIS — F419 Anxiety disorder, unspecified: Secondary | ICD-10-CM

## 2020-01-14 DIAGNOSIS — R197 Diarrhea, unspecified: Secondary | ICD-10-CM

## 2020-01-14 MED ORDER — SERTRALINE HCL 100 MG PO TABS: 150.0000 mg | ORAL_TABLET | Freq: Every day | ORAL | 1 refills | Status: DC

## 2020-01-14 NOTE — Progress Notes (Signed)
BP 139/67   Pulse 67   Ht 4' 11.5" (1.511 m)   Wt 133 lb (60.3 kg)   SpO2 97%   BMI 26.41 kg/m    Subjective:    Patient ID: Marie Parker, female    DOB: 29-May-1946, 74 y.o.   MRN: [id number]  HPI: Eddie Zec is a 74 y.o. female  Chief Complaint  Patient presents with  . Diarrhea  . Anxiety   Diarrhea is gone. Saw Dr. Marius Ditch yesterday. Not quite back to her regular stools, but significantly better. To have a follow up colonoscopy with her.  ANXIETY/STRESS Duration:exacerbated Anxious mood: yes  Excessive worrying: yes Irritability: yes  Sweating: no Nausea: no Palpitations:no Hyperventilation: no Panic attacks: yes Agoraphobia: no  Obscessions/compulsions: no Depressed mood: yes Depression screen [facility] 2/9 01/14/2020 09/17/2019 07/31/2019 04/24/2019 02/24/2019  Decreased Interest 2 0 0 0 0  Down, Depressed, Hopeless 0 0 1 0 0  PHQ - 2 Score 2 0 1 0 0  Altered sleeping 0 - 0 0 0  Tired, decreased energy 2 - 0 1 0  Change in appetite 1 - 0 0 0  Feeling bad or failure about yourself  2 - 0 0 0  Trouble concentrating 0 - 0 0 0  Moving slowly or fidgety/restless 2 - 0 0 0  Suicidal thoughts 0 - 0 0 0  PHQ-9 Score 9 - 1 1 0  Difficult doing work/chores Not difficult at all - Not difficult at all Not difficult at all Not difficult at all   GAD 7 : Generalized Anxiety Score 01/14/2020 07/31/2019 02/24/2019 05/24/2018  Nervous, Anxious, on Edge 3 1 3 1   Control/stop worrying 3 0 1 1  Worry too much - different things 2 0 1 0  Trouble relaxing 1 0 2 0  Restless 0 0 2 0  Easily annoyed or irritable 1 1 1  0  Afraid - awful might happen 3 1 1  0  Total GAD 7 Score 13 3 11 2   Anxiety Difficulty Not difficult at all Not difficult at all Not difficult at all -   Anhedonia: no Weight changes: no Insomnia: yes   Hypersomnia: no Fatigue/loss of energy: no Feelings of worthlessness: no Feelings of guilt: yes Impaired concentration/indecisiveness: yes Suicidal ideations:  no  Crying spells: no Recent Stressors/Life Changes: yes   Relationship problems: no   Family stress: yes     Financial stress: no    Job stress: no    Recent death/loss: yes   Relevant past medical, surgical, family and social history reviewed and updated as indicated. Interim medical history since our last visit reviewed. Allergies and medications reviewed and updated.  Review of Systems  Constitutional: Negative.   Respiratory: Negative.   Cardiovascular: Negative.   Gastrointestinal: Negative.   Skin: Negative.   Psychiatric/Behavioral: Positive for agitation, dysphoric mood and sleep disturbance. Negative for behavioral problems, confusion, decreased concentration, hallucinations, self-injury and suicidal ideas. The patient is nervous/anxious. The patient is not hyperactive.     Per HPI unless specifically indicated above     Objective:    BP 139/67   Pulse 67   Ht 4' 11.5" (1.511 m)   Wt 133 lb (60.3 kg)   SpO2 97%   BMI 26.41 kg/m   Wt Readings from Last 3 Encounters:  01/14/20 133 lb (60.3 kg)  01/13/20 133 lb 4 oz (60.4 kg)  12/18/19 136 lb (61.7 kg)    Physical Exam Vitals and nursing note reviewed.  Constitutional:  General: She is not in acute distress.    Appearance: Normal appearance. She is not ill-appearing, toxic-appearing or diaphoretic.  HENT:     Head: Normocephalic and atraumatic.     Right Ear: External ear normal.     Left Ear: External ear normal.     Nose: Nose normal.     Mouth/Throat:     Mouth: Mucous membranes are moist.     Pharynx: Oropharynx is clear.  Eyes:     General: No scleral icterus.       Right eye: No discharge.        Left eye: No discharge.     Conjunctiva/sclera: Conjunctivae normal.     Pupils: Pupils are equal, round, and reactive to light.  Pulmonary:     Effort: Pulmonary effort is normal. No respiratory distress.     Comments: Speaking in full sentences Musculoskeletal:        General: Normal range of  motion.     Cervical back: Normal range of motion.  Skin:    Coloration: Skin is not jaundiced or pale.     Findings: No bruising, erythema, lesion or rash.  Neurological:     Mental Status: She is alert and oriented to person, place, and time. Mental status is at baseline.  Psychiatric:        Mood and Affect: Mood normal.        Behavior: Behavior normal.        Thought Content: Thought content normal.        Judgment: Judgment normal.     Results for orders placed or performed during the hospital encounter of 12/22/19  Creatinine  Result Value Ref Range   Creatinine, Ser 0.98 0.44 - 1.00 mg/dL   GFR calc non Af Amer 58 (L) >60 mL/min   GFR calc Af Amer >60 >60 mL/min      Assessment & Plan:   Problem List Items Addressed This Visit      Other   Acute anxiety - Primary    Not doing well. Has been very anxious. Will increase her sertraline to 150mg  and recheck 1 month. Continue amitriptyline and PRN klonopin. Continue to monitor.      Relevant Medications   sertraline (ZOLOFT) 100 MG tablet    Other Visit Diagnoses    Diarrhea, unspecified type       Resolved. Feeling better. To have colonoscopy with GI. Call with any concerns.        Follow up plan: Return in about 4 weeks (around 02/11/2020) for follow up mood.   . This visit was completed via MyChart due to the restrictions of the COVID-19 pandemic. All issues as above were discussed and addressed. Physical exam was done as above through visual confirmation on MyChart. If it was felt that the patient should be evaluated in the office, they were directed there. The patient verbally consented to this visit. . Location of the patient: home . Location of the provider: home . Those involved with this call:  . Provider: Park Liter, DO . CMA: Lauretta Grill, RMA . Front Desk/Registration: Don Perking  . Time spent on call: 15 minutes with patient face to face via video conference. More than 50% of this time  was spent in counseling and coordination of care. 23 minutes total spent in review of patient's record and preparation of their chart.

## 2020-01-14 NOTE — Assessment & Plan Note (Signed)
Not doing well. Has been very anxious. Will increase her sertraline to 150mg  and recheck 1 month. Continue amitriptyline and PRN klonopin. Continue to monitor.

## 2020-01-14 NOTE — Telephone Encounter (Signed)
My Chart message already fwd to provider.

## 2020-01-14 NOTE — Telephone Encounter (Signed)
Pt stated that she need her Zoloft sent [facility] due to pharmacy wanting to charge her. Please advise

## 2020-01-15 DIAGNOSIS — M7711 Lateral epicondylitis, right elbow: Secondary | ICD-10-CM | POA: Diagnosis not present

## 2020-01-23 ENCOUNTER — Telehealth: Payer: Self-pay | Admitting: General Practice

## 2020-01-23 ENCOUNTER — Ambulatory Visit (INDEPENDENT_AMBULATORY_CARE_PROVIDER_SITE_OTHER): Payer: Medicare Other | Admitting: General Practice

## 2020-01-23 DIAGNOSIS — I129 Hypertensive chronic kidney disease with stage 1 through stage 4 chronic kidney disease, or unspecified chronic kidney disease: Secondary | ICD-10-CM | POA: Diagnosis not present

## 2020-01-23 DIAGNOSIS — I2510 Atherosclerotic heart disease of native coronary artery without angina pectoris: Secondary | ICD-10-CM

## 2020-01-23 DIAGNOSIS — E782 Mixed hyperlipidemia: Secondary | ICD-10-CM | POA: Diagnosis not present

## 2020-01-23 DIAGNOSIS — Z636 Dependent relative needing care at home: Secondary | ICD-10-CM

## 2020-01-23 DIAGNOSIS — F419 Anxiety disorder, unspecified: Secondary | ICD-10-CM

## 2020-01-23 DIAGNOSIS — R197 Diarrhea, unspecified: Secondary | ICD-10-CM

## 2020-01-23 DIAGNOSIS — G5761 Lesion of plantar nerve, right lower limb: Secondary | ICD-10-CM | POA: Diagnosis not present

## 2020-01-23 NOTE — Patient Instructions (Signed)
Visit Information  Goals Addressed            This Visit's Progress   . RNCM- I am working towards decreasing my anxiety (pt-stated)       Current Barriers:  [name] Knowledge Deficits related to caregiver strain and reducing daily anxiety . Lacks caregiver support.   Nurse Case Manager Clinical Goal(s):  [name] Over the next 120 days, patient will work with [facility] to address needs related to reducing caregiver strian  Interventions:  . Evaluation of current treatment plan related to anxiety/stress and patient's adherence to plan as established by provider. The patient has had an increase in her zoloft and her GI doctor started her on Amitriptyline.  The Amitriptyline had been working well until this am. She had to take a half of a Klonopin today because she had gotten so worked up.  She stated that the GI doctor did not want her to use the Klonopin but she does not use it unless she absolutely has to do so.  The patient states she thinks a lot of it has to do with thinking in her mind that she has cancer and the colonoscopy it going to reveal this. Empathetic listening.  . Advised patient to think about incorporating exercise into her daily routine, a healthy diet, and taking breaks and time for herself. 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 Discussed mindfulness exercises, writing in a journal, crafts, listening to music, taking time for self, reflection. The patient has a card game on her phone she plays and likes to color with an app she has on her phone.  . Reviewed medications with patient and discussed increase in zoloft has been very helpful.  The patient saw pcp recently for anxiety and her zoloft has been increased.  . Discussed plans with patient for ongoing care management follow up and provided patient with direct contact information for care management team . Reviewed with patient how she is handling stress/anxiety.  Patient stating she has decreased some of her outside activities to assist her spouse more which has relieved some of her time commitments. The patient states that her husband is doing better right now. She is not having do do daily infusions like she was and that has helped a lot. The patient is having to do a daily medication for him but she states it is nothing like before. She said, "I think everything is just catching up with me".  Education and support given. The patient talked about her strong faith and also that her and her husband are going to [place] next week for a few days. She is looking forward to the trip.   [name] LCSW is working with the patient at this time. Will touch base with pharmacy to see if they have any other recommendations for management of anxiety and stress.   Patient Self Care Activities:  . Currently UNABLE TO independently manage stress and anxiety, self reported . Performs ADL's independently . Performs IADL's independently  Please see past updates related to this goal by clicking on the "Past Updates" button in the selected goal      . RNCM: Pt- "I have not been taking my blood pressure" (pt-stated)       CARE PLAN ENTRY (see longtitudinal plan of care for additional care plan information)  Current Barriers:  . Chronic Disease Management support, education, and care coordination needs related  to CAD, HTN, HLD, and chronic diarrhea  Clinical Goal(s) related to CAD, HTN, HLD, and Chronic Diarrhea :  Over the next 120 days, patient will:  . Work with the care management team to address educational, disease management, and care coordination needs  . Begin or continue self health monitoring activities as directed today Measure and record blood pressure 2 times per week and adhere to a heart healthy diet . Call provider office for new or worsened signs and symptoms Blood pressure findings outside established parameters and New or worsened symptom related to  CAD/HLD/Diarrhea and other chronic conditions . Call care management team with questions or concerns . Verbalize basic understanding of patient centered plan of care established today  Interventions related to CAD, HTN, HLD, and Chronic Diarrhea :  . Evaluation of current treatment plans and patient's adherence to plan as established by provider.  The patient is having a colonoscopy on 02-09-2020 to evaluate the chronic diarrhea she has been dealing with. She has worked herself up into thinking she has cancer. Education, support and empathetic listening.  . Assessed patient understanding of disease states.  The patient verbalized understanding of chronic conditions. The patient encouraged to step back and do some self care to help with her health and well being.  . Assessed patient's education and care coordination needs.  Education and support on ways to decrease stress in her life and take care of self while she is also the primary caregiver of her husband.  . Provided disease specific education to patient. Education on the benefits of taking blood pressures a couple of times a week. Blood pressure readings have been elevated recently. The patient states she takes her husbands every day and she hasn't taken hers in a while. Education on the risk of elevated blood pressure of heart attack and stroke. Encouraged the patient to really focus on self care and finding balance in life.  Nash Dimmer with appropriate clinical care team members regarding patient needs . Review of upcoming appointments. The patient does not see the pcp again until 04-08-2020.  Will have colonoscopy on 02-09-2020.   Patient Self Care Activities related to CAD, HTN, HLD, and Chronic Diarrhea :  . Patient is unable to independently self-manage chronic health conditions  Initial goal documentation        Patient verbalizes understanding of instructions provided today.   The care management team will reach out to the patient  again over the next 60 days.   Noreene Larsson RN, MSN, [facility] Mobile: [phone]

## 2020-01-23 NOTE — Chronic Care Management (AMB) (Signed)
Chronic Care Management   Follow Up Note   01/23/2020 Name: Carlissa Butterly MRN: [id number] DOB: July 26, 1946  Referred by: Valerie Roys, DO Reason for referral : Chronic Care Management ( Follow up: RNCM Chronic Disease Management and Care Coordination needs)   Rinki Jurkowski is a 74 y.o. year old female who is a primary care patient of Valerie Roys, DO. The CCM team was consulted for assistance with chronic disease management and care coordination needs.    Review of patient status, including review of consultants reports, relevant laboratory and other test results, and collaboration with appropriate care team members and the patient's provider was performed as part of comprehensive patient evaluation and provision of chronic care management services.    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 Her anxiety level has been very bad this week]  Depression Interventions/Treatment   Medication [working with CCM LCSW and RNCM]       Outpatient Encounter Medications as of 01/23/2020  Medication Sig   acetaminophen (TYLENOL) 650 MG CR tablet Take 1,950 mg by mouth daily as needed for pain.   amitriptyline (ELAVIL) 25 MG tablet Take 1 tablet (25 mg total) by mouth at bedtime.   amLODipine (NORVASC) 10 MG tablet Take 1 tablet (10 mg total) by mouth daily.   aspirin 81 MG tablet Take 1 tablet (81 mg total) by mouth daily. (Patient taking differently: Take 81 mg by mouth at bedtime. )   atenolol (TENORMIN) 50 MG tablet Take 1 tablet (50 mg total) by mouth daily.   atorvastatin (LIPITOR) 40 MG tablet Take 1  tablet (40 mg total) by mouth daily.   benazepril (LOTENSIN) 40 MG tablet Take 1 tablet (40 mg total) by mouth daily.   CALCIUM PO Take by mouth.   clobetasol ointment (TEMOVATE) 0.05 % Daily at night for 6-12 weeks, if improvement, decrease to 2-3x per week (Patient not taking: Reported on 01/14/2020)   clonazePAM (KLONOPIN) 0.5 MG tablet Take 0.5-1 tablets (0.25-0.5 mg total) by mouth 2 (two) times daily as needed for anxiety.   diphenhydrAMINE (BENADRYL) 25 mg capsule Take 25 mg by mouth at bedtime.    fluticasone (FLONASE) 50 MCG/ACT nasal spray Place 2 sprays into both nostrils daily.   hydrALAZINE (APRESOLINE) 25 MG tablet Take 1 tablet (25 mg total) by mouth 2 (two) times daily.   letrozole (FEMARA) 2.5 MG tablet Take 1 tablet (2.5 mg total) by mouth daily.   Loperamide HCl (IMODIUM PO) Take by mouth.   niacin 500 MG tablet Take 1,000 mg by mouth at bedtime.   nystatin (MYCOSTATIN) 100000 UNIT/ML suspension Take 5 mLs (500,000 Units total) by mouth 4 (four) times daily. (Patient not taking: Reported on 01/14/2020)   omeprazole (PRILOSEC) 40 MG capsule Take 1 capsule (40 mg total) by mouth daily before breakfast. (Patient not taking: Reported on 01/14/2020)   Probiotic Product (PROBIOTIC-10 PO) Take by mouth daily.   sertraline (ZOLOFT) 100 MG tablet Take 1.5 tablets (150 mg total) by mouth daily.   tiZANidine (ZANAFLEX) 4 MG tablet TAKE 1 TABLET(4 MG) BY MOUTH EVERY 6 HOURS AS NEEDED FOR MUSCLE SPASMS (Patient not taking: Reported on 01/14/2020)  traMADol (ULTRAM) 50 MG tablet Take 50 mg by mouth every 6 (six) hours.   Turmeric 500 MG TABS Take 500 mg by mouth daily.   No facility-administered encounter medications on file as of 01/23/2020.     Objective:  BP Readings from Last 3 Encounters:  01/14/20 139/67  01/13/20 (!) 155/93  12/18/19 (!) 155/77    Goals Addressed            This Visit's Progress    RNCM- I am working towards decreasing my anxiety  (pt-stated)       Current Barriers:   Knowledge Deficits related to caregiver strain and reducing daily anxiety  Lacks caregiver support.   Nurse Case Manager Clinical Goal(s):   Over the next 120 days, patient will work with [facility] to address needs related to reducing caregiver strian  Interventions:   Evaluation of current treatment plan related to anxiety/stress and patient's adherence to plan as established by provider. The patient has had an increase in her zoloft and her GI doctor started her on Amitriptyline.  The Amitriptyline had been working well until this am. She had to take a half of a Klonopin today because she had gotten so worked up.  She stated that the GI doctor did not want her to use the Klonopin but she does not use it unless she absolutely has to do so.  The patient states she thinks a lot of it has to do with thinking in her mind that she has cancer and the colonoscopy it going to reveal this. Empathetic listening.   Advised patient to think about incorporating exercise into her daily routine, a healthy diet, and taking breaks and time for herself. 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 Discussed mindfulness exercises, writing in a journal, crafts, listening to music, taking time for self, reflection. The patient has a card game on her phone she plays and likes to color with an app she has on her phone.   Reviewed medications with patient and discussed increase in zoloft has been very helpful.  The patient saw pcp recently for anxiety and her zoloft has been increased.   Discussed plans with patient for ongoing care management follow up and provided patient with direct contact information for care management team  Reviewed with patient how she is handling stress/anxiety. Patient stating she has decreased some of her outside activities to assist her spouse more which has relieved some of her time  commitments. The patient states that her husband is doing better right now. She is not having do do daily infusions like she was and that has helped a lot. The patient is having to do a daily medication for him but she states it is nothing like before. She said, "I think everything is just catching up with me".  Education and support given. The patient talked about her strong faith and also that her and her husband are going to [place] next week for a few days. She is looking forward to the trip.    LCSW is working with the patient at this time. Will touch base with pharmacy to see if they have any other recommendations for management of anxiety and stress.   Patient Self Care Activities:   Currently UNABLE TO independently manage stress and anxiety, self reported  Performs ADL's independently  Performs IADL's independently  Please see past updates related to this goal by  clicking on the "Past Updates" button in the selected goal       RNCM: Pt- "I have not been taking my blood pressure" (pt-stated)       CARE PLAN ENTRY (see longtitudinal plan of care for additional care plan information)  Current Barriers:   Chronic Disease Management support, education, and care coordination needs related to CAD, HTN, HLD, and chronic diarrhea  Clinical Goal(s) related to CAD, HTN, HLD, and Chronic Diarrhea :  Over the next 120 days, patient will:   Work with the care management team to address educational, disease management, and care coordination needs   Begin or continue self health monitoring activities as directed today Measure and record blood pressure 2 times per week and adhere to a heart healthy diet  Call provider office for new or worsened signs and symptoms Blood pressure findings outside established parameters and New or worsened symptom related to CAD/HLD/Diarrhea and other chronic conditions  Call care management team with questions or concerns  Verbalize basic  understanding of patient centered plan of care established today  Interventions related to CAD, HTN, HLD, and Chronic Diarrhea :   Evaluation of current treatment plans and patient's adherence to plan as established by provider.  The patient is having a colonoscopy on 02-09-2020 to evaluate the chronic diarrhea she has been dealing with. She has worked herself up into thinking she has cancer. Education, support and empathetic listening.   Assessed patient understanding of disease states.  The patient verbalized understanding of chronic conditions. The patient encouraged to step back and do some self care to help with her health and well being.   Assessed patient's education and care coordination needs.  Education and support on ways to decrease stress in her life and take care of self while she is also the primary caregiver of her husband.   Provided disease specific education to patient. Education on the benefits of taking blood pressures a couple of times a week. Blood pressure readings have been elevated recently. The patient states she takes her husbands every day and she hasn't taken hers in a while. Education on the risk of elevated blood pressure of heart attack and stroke. Encouraged the patient to really focus on self care and finding balance in life.   Collaborated with appropriate clinical care team members regarding patient needs  Review of upcoming appointments. The patient does not see the pcp again until 04-08-2020.  Will have colonoscopy on 02-09-2020.   Patient Self Care Activities related to CAD, HTN, HLD, and Chronic Diarrhea :   Patient is unable to independently self-manage chronic health conditions  Initial goal documentation         Plan:   The care management team will reach out to the patient again over the next 30 to 60 days.    Noreene Larsson RN, MSN, [facility] Mobile:  [phone]

## 2020-01-27 ENCOUNTER — Other Ambulatory Visit: Payer: Self-pay

## 2020-01-27 ENCOUNTER — Other Ambulatory Visit: Payer: Self-pay | Admitting: Family Medicine

## 2020-01-27 DIAGNOSIS — K219 Gastro-esophageal reflux disease without esophagitis: Secondary | ICD-10-CM

## 2020-01-27 DIAGNOSIS — F411 Generalized anxiety disorder: Secondary | ICD-10-CM

## 2020-01-27 MED ORDER — AMITRIPTYLINE HCL 25 MG PO TABS: 25.0000 mg | ORAL_TABLET | Freq: Every day | ORAL | 0 refills | Status: DC

## 2020-01-27 MED ORDER — OMEPRAZOLE 40 MG PO CPDR: 40.0000 mg | DELAYED_RELEASE_CAPSULE | Freq: Every day | ORAL | 0 refills | Status: DC

## 2020-01-27 NOTE — Telephone Encounter (Signed)
Last office visit 01/13/2020 GERD  Last refill Elavil 01/13/2020 30 tab 0 refills  Omeprazole 01/13/2020 0 refills 30

## 2020-01-27 NOTE — Telephone Encounter (Signed)
Upcoming appointment 6/11- may discuss 1 yr refills then- RF per protocol

## 2020-01-29 ENCOUNTER — Ambulatory Visit (INDEPENDENT_AMBULATORY_CARE_PROVIDER_SITE_OTHER): Payer: Medicare Other | Admitting: General Practice

## 2020-01-29 ENCOUNTER — Ambulatory Visit: Payer: Medicare Other | Admitting: Radiation Oncology

## 2020-01-29 DIAGNOSIS — Z636 Dependent relative needing care at home: Secondary | ICD-10-CM

## 2020-01-29 DIAGNOSIS — F419 Anxiety disorder, unspecified: Secondary | ICD-10-CM

## 2020-01-29 DIAGNOSIS — R197 Diarrhea, unspecified: Secondary | ICD-10-CM

## 2020-01-29 DIAGNOSIS — I2510 Atherosclerotic heart disease of native coronary artery without angina pectoris: Secondary | ICD-10-CM

## 2020-01-29 DIAGNOSIS — E782 Mixed hyperlipidemia: Secondary | ICD-10-CM

## 2020-01-29 NOTE — Chronic Care Management (AMB) (Signed)
Chronic Care Management   Follow Up Note   01/29/2020 Name: Marie Parker MRN: [id number] DOB: May 05, 1946  Referred by: Valerie Roys, DO Reason for referral : Chronic Care Management (Follow up: Chronic Conditions and Care Coordination Needs)   Marie Parker is a 74 y.o. year old female who is a primary care patient of Valerie Roys, DO. The CCM team was consulted for assistance with chronic disease management and care coordination needs.    Review of patient status, including review of consultants reports, relevant laboratory and other test results, and collaboration with appropriate care team members and the patient's provider was performed as part of comprehensive patient evaluation and provision of chronic care management services.    SDOH (Social Determinants of Health) assessments performed: Yes- stress and anxiety See Care Plan activities for detailed interventions related to [facility])     Outpatient Encounter Medications as of 01/29/2020  Medication Sig   acetaminophen (TYLENOL) 650 MG CR tablet Take 1,950 mg by mouth daily as needed for pain.   amitriptyline (ELAVIL) 25 MG tablet Take 1 tablet (25 mg total) by mouth at bedtime.   amLODipine (NORVASC) 10 MG tablet TAKE 1 TABLET BY MOUTH  DAILY   aspirin 81 MG tablet Take 1 tablet (81 mg total) by mouth daily. (Patient taking differently: Take 81 mg by mouth at bedtime. )   atenolol (TENORMIN) 50 MG tablet TAKE 1 TABLET BY MOUTH  DAILY   atorvastatin (LIPITOR) 40 MG tablet TAKE 1 TABLET BY MOUTH  DAILY   benazepril (LOTENSIN) 40 MG tablet TAKE 1 TABLET BY MOUTH  DAILY   CALCIUM PO Take by mouth.   clobetasol ointment (TEMOVATE) 0.05 % Daily at night for 6-12 weeks, if improvement, decrease to 2-3x per week (Patient not taking: Reported on 01/14/2020)   clonazePAM (KLONOPIN) 0.5 MG tablet Take 0.5-1 tablets (0.25-0.5 mg total) by mouth 2 (two) times daily as needed for anxiety.   diphenhydrAMINE (BENADRYL) 25 mg  capsule Take 25 mg by mouth at bedtime.    fluticasone (FLONASE) 50 MCG/ACT nasal spray Place 2 sprays into both nostrils daily.   hydrALAZINE (APRESOLINE) 25 MG tablet TAKE 1 TABLET BY MOUTH  TWICE DAILY   letrozole (FEMARA) 2.5 MG tablet Take 1 tablet (2.5 mg total) by mouth daily.   Loperamide HCl (IMODIUM PO) Take by mouth.   niacin 500 MG tablet Take 1,000 mg by mouth at bedtime.   nystatin (MYCOSTATIN) 100000 UNIT/ML suspension Take 5 mLs (500,000 Units total) by mouth 4 (four) times daily. (Patient not taking: Reported on 01/14/2020)   omeprazole (PRILOSEC) 40 MG capsule Take 1 capsule (40 mg total) by mouth daily before breakfast.   Probiotic Product (PROBIOTIC-10 PO) Take by mouth daily.   sertraline (ZOLOFT) 100 MG tablet Take 1.5 tablets (150 mg total) by mouth daily.   tiZANidine (ZANAFLEX) 4 MG tablet TAKE 1 TABLET(4 MG) BY MOUTH EVERY 6 HOURS AS NEEDED FOR MUSCLE SPASMS (Patient not taking: Reported on 01/14/2020)   traMADol (ULTRAM) 50 MG tablet Take 50 mg by mouth every 6 (six) hours.   Turmeric 500 MG TABS Take 500 mg by mouth daily.   No facility-administered encounter medications on file as of 01/29/2020.     Objective:   Goals Addressed            This Visit's Progress    RNCM- I am working towards decreasing my anxiety (pt-stated)       Current Barriers:   Knowledge Deficits related to caregiver  strain and reducing daily anxiety  Lacks caregiver support.   Nurse Case Manager Clinical Goal(s):   Over the next 120 days, patient will work with [facility] to address needs related to reducing caregiver strian  Interventions:   Evaluation of current treatment plan related to anxiety/stress and patient's adherence to plan as established by provider. The patient has had an increase in her zoloft and her GI doctor started her on Amitriptyline.  The Amitriptyline had been working well. She had to take a half of a Klonopin today because she had gotten so worked up.   She stated that the GI doctor did not want her to use the Klonopin but she does not use it unless she absolutely has to do so.  The patient states she thinks a lot of it has to do with thinking in her mind that she has cancer and the colonoscopy it going to reveal this. Empathetic listening. 01/29/2020:  The patient called today to see if Dr. Wynetta Emery would do a refill for the Amitriptyline. [facility] called the GI provider but the GI provider would not refill the Amitriptyline. The patient ask if Dr. Wynetta Emery would be willing to do a refill. This is helping the patient rest better. The patient wanted the [facility] to know that she is feeling better. She and her husband are currently at the beach and she is happy to be a way for a few days. Will send an inbasket message to Dr. Wynetta Emery and the pharm D asking for recommendations and guidance.   Advised patient to think about incorporating exercise into her daily routine, a healthy diet, and taking breaks and time for herself. 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 Discussed mindfulness exercises, writing in a journal, crafts, listening to music, taking time for self, reflection. The patient has a card game on her phone she plays and likes to color with an app she has on her phone.   Reviewed medications with patient and discussed increase in zoloft has been very helpful.  The patient saw pcp recently for anxiety and her zoloft has been increased.   Discussed plans with patient for ongoing care management follow up and provided patient with direct contact information for care management team  Reviewed with patient how she is handling stress/anxiety. Patient stating she has decreased some of her outside activities to assist her spouse more which has relieved some of her time commitments. The patient states that her husband is doing better right now. She is not having do do daily infusions like she  was and that has helped a lot. The patient is having to do a daily medication for him but she states it is nothing like before. She said, "I think everything is just catching up with me".  Education and support given. The patient talked about her strong faith and also that her and her husband are going to [place] next week for a few days. She is looking forward to the trip. 01-29-2020: the patient and her husband are currently at the beach. She was sitting out on the deck enjoying the ocean breeze. The patient says she feels much better today.    LCSW is working with the patient at this time. Will touch base with pharmacy to see if they have any other recommendations for management of anxiety and stress.   Patient Self Care Activities:   Currently UNABLE TO independently  manage stress and anxiety, self reported  Performs ADL's independently  Performs IADL's independently  Please see past updates related to this goal by clicking on the "Past Updates" button in the selected goal       RNCM: Pt- "I have not been taking my blood pressure" (pt-stated)       CARE PLAN ENTRY (see longtitudinal plan of care for additional care plan information)  Current Barriers:   Chronic Disease Management support, education, and care coordination needs related to CAD, HTN, HLD, and chronic diarrhea  Clinical Goal(s) related to CAD, HTN, HLD, and Chronic Diarrhea :  Over the next 120 days, patient will:   Work with the care management team to address educational, disease management, and care coordination needs   Begin or continue self health monitoring activities as directed today Measure and record blood pressure 2 times per week and adhere to a heart healthy diet  Call provider office for new or worsened signs and symptoms Blood pressure findings outside established parameters and New or worsened symptom related to CAD/HLD/Diarrhea and other chronic conditions  Call care management team with  questions or concerns  Verbalize basic understanding of patient centered plan of care established today  Interventions related to CAD, HTN, HLD, and Chronic Diarrhea :   Evaluation of current treatment plans and patient's adherence to plan as established by provider.  The patient is having a colonoscopy on 02-09-2020 to evaluate the chronic diarrhea she has been dealing with. She has worked herself up into thinking she has cancer. Education, support and empathetic listening.   Assessed patient understanding of disease states.  The patient verbalized understanding of chronic conditions. The patient encouraged to step back and do some self care to help with her health and well being.   Assessed patient's education and care coordination needs.  Education and support on ways to decrease stress in her life and take care of self while she is also the primary caregiver of her husband.   Provided disease specific education to patient. Education on the benefits of taking blood pressures a couple of times a week. Blood pressure readings have been elevated recently. The patient states she takes her husbands every day and she hasn't taken hers in a while. Education on the risk of elevated blood pressure of heart attack and stroke. Encouraged the patient to really focus on self care and finding balance in life. 01-29-2020: The patient states she feels this trip to the beach is going to be very beneficial for her and her husband. She feels relaxed and much better than she did last week when talking to the [facility].  Will continue to monitor.   Collaborated with appropriate clinical care team members regarding patient needs  Review of upcoming appointments. The patient does not see the pcp again until 04-08-2020.  Will have colonoscopy on 02-09-2020.   Patient Self Care Activities related to CAD, HTN, HLD, and Chronic Diarrhea :   Patient is unable to independently self-manage chronic health conditions  Initial goal  documentation         Plan:   The care management team will reach out to the patient again over the next 30 to 60 days.    Noreene Larsson RN, MSN, [facility] Mobile: [phone]

## 2020-01-29 NOTE — Patient Instructions (Signed)
Visit Information  Goals Addressed            This Visit's Progress   . RNCM- I am working towards decreasing my anxiety (pt-stated)       Current Barriers:  [name] Knowledge Deficits related to caregiver strain and reducing daily anxiety . Lacks caregiver support.   Nurse Case Manager Clinical Goal(s):  [name] Over the next 120 days, patient will work with [facility] to address needs related to reducing caregiver strian  Interventions:  . Evaluation of current treatment plan related to anxiety/stress and patient's adherence to plan as established by provider. The patient has had an increase in her zoloft and her GI doctor started her on Amitriptyline.  The Amitriptyline had been working well. She had to take a half of a Klonopin today because she had gotten so worked up.  She stated that the GI doctor did not want her to use the Klonopin but she does not use it unless she absolutely has to do so.  The patient states she thinks a lot of it has to do with thinking in her mind that she has cancer and the colonoscopy it going to reveal this. Empathetic listening. 01/29/2020:  The patient called today to see if Dr. Wynetta Emery would do a refill for the Amitriptyline. [facility] called the GI provider but the GI provider would not refill the Amitriptyline. The patient ask if Dr. Wynetta Emery would be willing to do a refill. This is helping the patient rest better. The patient wanted the [facility] to know that she is feeling better. She and her husband are currently at the beach and she is happy to be a way for a few days. Will send an inbasket message to Dr. Wynetta Emery and the pharm D asking for recommendations and guidance.  . Advised patient to think about incorporating exercise into her daily routine, a healthy diet, and taking breaks and time for herself. 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 Discussed mindfulness exercises, writing in a journal, crafts,  listening to music, taking time for self, reflection. The patient has a card game on her phone she plays and likes to color with an app she has on her phone.  . Reviewed medications with patient and discussed increase in zoloft has been very helpful.  The patient saw pcp recently for anxiety and her zoloft has been increased.  . Discussed plans with patient for ongoing care management follow up and provided patient with direct contact information for care management team . Reviewed with patient how she is handling stress/anxiety. Patient stating she has decreased some of her outside activities to assist her spouse more which has relieved some of her time commitments. The patient states that her husband is doing better right now. She is not having do do daily infusions like she was and that has helped a lot. The patient is having to do a daily medication for him but she states it is nothing like before. She said, "I think everything is just catching up with me".  Education and support given. The patient talked about her strong faith and also that her and her husband are going to [place] next week for a few days. She is looking forward to the trip. 01-29-2020: the patient and her husband are currently at the beach. She was sitting out on the deck enjoying the ocean breeze. The patient says she feels much  better today.   [name] LCSW is working with the patient at this time. Will touch base with pharmacy to see if they have any other recommendations for management of anxiety and stress.   Patient Self Care Activities:  . Currently UNABLE TO independently manage stress and anxiety, self reported . Performs ADL's independently . Performs IADL's independently  Please see past updates related to this goal by clicking on the "Past Updates" button in the selected goal      . RNCM: Pt- "I have not been taking my blood pressure" (pt-stated)       CARE PLAN ENTRY (see longtitudinal plan of care for additional  care plan information)  Current Barriers:  . Chronic Disease Management support, education, and care coordination needs related to CAD, HTN, HLD, and chronic diarrhea  Clinical Goal(s) related to CAD, HTN, HLD, and Chronic Diarrhea :  Over the next 120 days, patient will:  . Work with the care management team to address educational, disease management, and care coordination needs  . Begin or continue self health monitoring activities as directed today Measure and record blood pressure 2 times per week and adhere to a heart healthy diet . Call provider office for new or worsened signs and symptoms Blood pressure findings outside established parameters and New or worsened symptom related to CAD/HLD/Diarrhea and other chronic conditions . Call care management team with questions or concerns . Verbalize basic understanding of patient centered plan of care established today  Interventions related to CAD, HTN, HLD, and Chronic Diarrhea :  . Evaluation of current treatment plans and patient's adherence to plan as established by provider.  The patient is having a colonoscopy on 02-09-2020 to evaluate the chronic diarrhea she has been dealing with. She has worked herself up into thinking she has cancer. Education, support and empathetic listening.  . Assessed patient understanding of disease states.  The patient verbalized understanding of chronic conditions. The patient encouraged to step back and do some self care to help with her health and well being.  . Assessed patient's education and care coordination needs.  Education and support on ways to decrease stress in her life and take care of self while she is also the primary caregiver of her husband.  . Provided disease specific education to patient. Education on the benefits of taking blood pressures a couple of times a week. Blood pressure readings have been elevated recently. The patient states she takes her husbands every day and she hasn't taken hers  in a while. Education on the risk of elevated blood pressure of heart attack and stroke. Encouraged the patient to really focus on self care and finding balance in life. 01-29-2020: The patient states she feels this trip to the beach is going to be very beneficial for her and her husband. She feels relaxed and much better than she did last week when talking to the [facility].  Will continue to monitor.  Nash Dimmer with appropriate clinical care team members regarding patient needs . Review of upcoming appointments. The patient does not see the pcp again until 04-08-2020.  Will have colonoscopy on 02-09-2020.   Patient Self Care Activities related to CAD, HTN, HLD, and Chronic Diarrhea :  . Patient is unable to independently self-manage chronic health conditions  Initial goal documentation        Patient verbalizes understanding of instructions provided today.   The care management team will reach out to the patient again over the next 30 to 60 days.  [name] Tate RN, MSN, CCM Community Care Coordinator [place]  [facility] Mobile: [phone]   

## 2020-02-02 ENCOUNTER — Other Ambulatory Visit: Payer: Self-pay

## 2020-02-02 DIAGNOSIS — K219 Gastro-esophageal reflux disease without esophagitis: Secondary | ICD-10-CM

## 2020-02-02 DIAGNOSIS — F411 Generalized anxiety disorder: Secondary | ICD-10-CM

## 2020-02-02 MED ORDER — AMITRIPTYLINE HCL 25 MG PO TABS: 25.0000 mg | ORAL_TABLET | Freq: Every day | ORAL | 0 refills | Status: DC

## 2020-02-02 NOTE — Telephone Encounter (Signed)
Last office visit 01/13/2020 GERD  Last refill 01/27/2020 0 refills  Appointment 04/23/2020

## 2020-02-05 ENCOUNTER — Other Ambulatory Visit: Payer: Self-pay

## 2020-02-05 ENCOUNTER — Other Ambulatory Visit
Admission: RE | Admit: 2020-02-05 | Discharge: 2020-02-05 | Disposition: A | Discharge status: Home / Self Care | Payer: Medicare Other | Source: Ambulatory Visit | Attending: Gastroenterology | Admitting: Gastroenterology

## 2020-02-05 DIAGNOSIS — Z01812 Encounter for preprocedural laboratory examination: Secondary | ICD-10-CM | POA: Insufficient documentation

## 2020-02-05 DIAGNOSIS — Z20822 Contact with and (suspected) exposure to covid-19: Secondary | ICD-10-CM | POA: Insufficient documentation

## 2020-02-06 ENCOUNTER — Telehealth: Payer: Self-pay

## 2020-02-06 LAB — SARS CORONAVIRUS 2 (TAT 6-24 HRS): SARS Coronavirus 2: NEGATIVE

## 2020-02-09 ENCOUNTER — Encounter: Payer: Self-pay | Admitting: Gastroenterology

## 2020-02-09 ENCOUNTER — Encounter
Admission: RE | Disposition: A | Discharge status: Home / Self Care | Payer: Self-pay | Source: Home / Self Care | Attending: Gastroenterology

## 2020-02-09 ENCOUNTER — Ambulatory Visit: Payer: Medicare Other | Admitting: Registered Nurse

## 2020-02-09 ENCOUNTER — Other Ambulatory Visit: Payer: Self-pay

## 2020-02-09 ENCOUNTER — Ambulatory Visit
Admission: RE | Admit: 2020-02-09 | Discharge: 2020-02-09 | Disposition: A | Discharge status: Home / Self Care | Payer: Medicare Other | Attending: Gastroenterology | Admitting: Gastroenterology

## 2020-02-09 DIAGNOSIS — K635 Polyp of colon: Secondary | ICD-10-CM

## 2020-02-09 DIAGNOSIS — F419 Anxiety disorder, unspecified: Secondary | ICD-10-CM | POA: Diagnosis not present

## 2020-02-09 DIAGNOSIS — Z8249 Family history of ischemic heart disease and other diseases of the circulatory system: Secondary | ICD-10-CM | POA: Insufficient documentation

## 2020-02-09 DIAGNOSIS — K5790 Diverticulosis of intestine, part unspecified, without perforation or abscess without bleeding: Secondary | ICD-10-CM | POA: Diagnosis not present

## 2020-02-09 DIAGNOSIS — Z79899 Other long term (current) drug therapy: Secondary | ICD-10-CM | POA: Diagnosis not present

## 2020-02-09 DIAGNOSIS — M810 Age-related osteoporosis without current pathological fracture: Secondary | ICD-10-CM | POA: Diagnosis not present

## 2020-02-09 DIAGNOSIS — Z87891 Personal history of nicotine dependence: Secondary | ICD-10-CM | POA: Diagnosis not present

## 2020-02-09 DIAGNOSIS — Z1211 Encounter for screening for malignant neoplasm of colon: Secondary | ICD-10-CM | POA: Insufficient documentation

## 2020-02-09 DIAGNOSIS — Z7982 Long term (current) use of aspirin: Secondary | ICD-10-CM | POA: Diagnosis not present

## 2020-02-09 DIAGNOSIS — K2960 Other gastritis without bleeding: Secondary | ICD-10-CM | POA: Diagnosis not present

## 2020-02-09 DIAGNOSIS — Z86010 Personal history of colonic polyps: Secondary | ICD-10-CM | POA: Insufficient documentation

## 2020-02-09 DIAGNOSIS — Z923 Personal history of irradiation: Secondary | ICD-10-CM | POA: Diagnosis not present

## 2020-02-09 DIAGNOSIS — I129 Hypertensive chronic kidney disease with stage 1 through stage 4 chronic kidney disease, or unspecified chronic kidney disease: Secondary | ICD-10-CM | POA: Insufficient documentation

## 2020-02-09 DIAGNOSIS — I739 Peripheral vascular disease, unspecified: Secondary | ICD-10-CM | POA: Insufficient documentation

## 2020-02-09 DIAGNOSIS — Z853 Personal history of malignant neoplasm of breast: Secondary | ICD-10-CM | POA: Insufficient documentation

## 2020-02-09 DIAGNOSIS — D126 Benign neoplasm of colon, unspecified: Secondary | ICD-10-CM | POA: Diagnosis not present

## 2020-02-09 DIAGNOSIS — I2510 Atherosclerotic heart disease of native coronary artery without angina pectoris: Secondary | ICD-10-CM | POA: Insufficient documentation

## 2020-02-09 DIAGNOSIS — K219 Gastro-esophageal reflux disease without esophagitis: Secondary | ICD-10-CM | POA: Insufficient documentation

## 2020-02-09 DIAGNOSIS — N189 Chronic kidney disease, unspecified: Secondary | ICD-10-CM | POA: Diagnosis not present

## 2020-02-09 DIAGNOSIS — M1990 Unspecified osteoarthritis, unspecified site: Secondary | ICD-10-CM | POA: Diagnosis not present

## 2020-02-09 DIAGNOSIS — D124 Benign neoplasm of descending colon: Secondary | ICD-10-CM | POA: Diagnosis not present

## 2020-02-09 DIAGNOSIS — K5730 Diverticulosis of large intestine without perforation or abscess without bleeding: Secondary | ICD-10-CM | POA: Insufficient documentation

## 2020-02-09 DIAGNOSIS — E785 Hyperlipidemia, unspecified: Secondary | ICD-10-CM | POA: Diagnosis not present

## 2020-02-09 HISTORY — PX: COLONOSCOPY WITH PROPOFOL: SHX5780

## 2020-02-09 HISTORY — PX: ESOPHAGOGASTRODUODENOSCOPY (EGD) WITH PROPOFOL: SHX5813

## 2020-02-09 SURGERY — COLONOSCOPY WITH PROPOFOL
Anesthesia: General

## 2020-02-09 MED ORDER — LIDOCAINE HCL (PF) 2 % IJ SOLN
INTRAMUSCULAR | Status: AC
  Filled 2020-02-09: qty 30

## 2020-02-09 MED ORDER — OMEPRAZOLE 40 MG PO CPDR: 40.0000 mg | DELAYED_RELEASE_CAPSULE | Freq: Two times a day (BID) | ORAL | 0 refills | Status: DC

## 2020-02-09 MED ORDER — EPHEDRINE SULFATE 50 MG/ML IJ SOLN
INTRAMUSCULAR | Status: DC | PRN
  Administered 2020-02-09: 5 mg via INTRAVENOUS

## 2020-02-09 MED ORDER — PROPOFOL 10 MG/ML IV BOLUS
INTRAVENOUS | Status: DC | PRN
  Administered 2020-02-09: 50 mg via INTRAVENOUS
  Administered 2020-02-09: 20 mg via INTRAVENOUS
  Administered 2020-02-09: 30 mg via INTRAVENOUS

## 2020-02-09 MED ORDER — DEXMEDETOMIDINE HCL 200 MCG/2ML IV SOLN
INTRAVENOUS | Status: DC | PRN
  Administered 2020-02-09: 20 ug via INTRAVENOUS

## 2020-02-09 MED ORDER — PROPOFOL 500 MG/50ML IV EMUL
INTRAVENOUS | Status: AC
  Filled 2020-02-09: qty 150

## 2020-02-09 MED ORDER — DEXMEDETOMIDINE HCL IN NACL 200 MCG/50ML IV SOLN
INTRAVENOUS | Status: AC
  Filled 2020-02-09: qty 50

## 2020-02-09 MED ORDER — PROPOFOL 500 MG/50ML IV EMUL
INTRAVENOUS | Status: DC | PRN
  Administered 2020-02-09: 125 ug/kg/min via INTRAVENOUS

## 2020-02-09 MED ORDER — EPHEDRINE 5 MG/ML INJ
INTRAVENOUS | Status: AC
  Filled 2020-02-09: qty 4

## 2020-02-09 MED ORDER — LIDOCAINE HCL (CARDIAC) PF 100 MG/5ML IV SOSY
PREFILLED_SYRINGE | INTRAVENOUS | Status: DC | PRN
  Administered 2020-02-09: 60 mg via INTRAVENOUS

## 2020-02-09 MED ORDER — PHENYLEPHRINE HCL (PRESSORS) 10 MG/ML IV SOLN
INTRAVENOUS | Status: AC
  Filled 2020-02-09: qty 1

## 2020-02-09 MED ORDER — SODIUM CHLORIDE 0.9 % IV SOLN: INTRAVENOUS | Status: DC

## 2020-02-09 NOTE — Op Note (Signed)
[facility] Patient Name: Marie Parker Procedure Date: 02/09/2020 8:34 AM MRN: [id number] Account #: [id number] Date of Birth: 08-10-1946 Admit Type: Outpatient Age: 74 Room: [facility] ENDO ROOM 1 Gender: Female Note Status: Finalized Procedure:             Upper GI endoscopy Indications:           Esophageal reflux symptoms that persist despite                         appropriate therapy Providers:             Lin Landsman MD, MD Medicines:             Monitored Anesthesia Care Complications:         No immediate complications. Estimated blood loss: None. Procedure:             Pre-Anesthesia Assessment:                        - Prior to the procedure, a History and Physical was                         performed, and patient medications and allergies were                         reviewed. The patient is competent. The risks and                         benefits of the procedure and the sedation options and                         risks were discussed with the patient. All questions                         were answered and informed consent was obtained.                         Patient identification and proposed procedure were                         verified by the physician, the nurse, the                         anesthesiologist, the anesthetist and the technician                         in the pre-procedure area in the procedure room in the                         endoscopy suite. Mental Status Examination: alert and                         oriented. Airway Examination: normal oropharyngeal                         airway and neck mobility. Respiratory Examination:                         clear to auscultation. CV Examination: normal.  Prophylactic Antibiotics: The patient does not require                         prophylactic antibiotics. Prior Anticoagulants: The                         patient has taken no previous  anticoagulant or                         antiplatelet agents. ASA Grade Assessment: III - A                         patient with severe systemic disease. After reviewing                         the risks and benefits, the patient was deemed in                         satisfactory condition to undergo the procedure. The                         anesthesia plan was to use monitored anesthesia care                         (MAC). Immediately prior to administration of                         medications, the patient was re-assessed for adequacy                         to receive sedatives. The heart rate, respiratory                         rate, oxygen saturations, blood pressure, adequacy of                         pulmonary ventilation, and response to care were                         monitored throughout the procedure. The physical                         status of the patient was re-assessed after the                         procedure.                        After obtaining informed consent, the endoscope was                         passed under direct vision. Throughout the procedure,                         the patient's blood pressure, pulse, and oxygen                         saturations were monitored continuously. The Endoscope  was introduced through the mouth, and advanced to the                         second part of duodenum. The upper GI endoscopy was                         accomplished without difficulty. The patient tolerated                         the procedure well. Findings:      The duodenal bulb and second portion of the duodenum were normal.      The entire examined stomach was normal. Biopsies were taken with a cold       forceps for Helicobacter pylori testing.      The cardia and gastric fundus were normal on retroflexion.      Esophagogastric landmarks were identified: the gastroesophageal junction       was found at 35 cm from the  incisors.      The gastroesophageal junction and examined esophagus were normal.       Biopsies were taken with a cold forceps for histology. Impression:            - Normal duodenal bulb and second portion of the                         duodenum.                        - Normal stomach. Biopsied.                        - Esophagogastric landmarks identified.                        - Normal gastroesophageal junction and esophagus.                         Biopsied. Recommendation:        - Await pathology results.                        - Follow an antireflux regimen.                        - Use a proton pump inhibitor PO BID.                        - Proceed with colonoscopy as scheduled                        See colonoscopy report Procedure Code(s):     --- Professional ---                        [phone], Esophagogastroduodenoscopy, flexible,                         transoral; with biopsy, single or multiple Diagnosis Code(s):     --- Professional ---                        K21.9, Gastro-esophageal reflux disease without  esophagitis CPT copyright 2019 American Medical Association. All rights reserved. The codes documented in this report are preliminary and upon coder review may  be revised to meet current compliance requirements. Dr. Ulyess Mort Lin Landsman MD, MD 02/09/2020 8:49:09 AM This report has been signed electronically. Number of Addenda: 0 Note Initiated On: 02/09/2020 8:34 AM Estimated Blood Loss:  Estimated blood loss: none.      [facility]

## 2020-02-09 NOTE — Anesthesia Postprocedure Evaluation (Signed)
Anesthesia Post Note  Patient: Sumiko Ceasar  Procedure(s) Performed: COLONOSCOPY WITH PROPOFOL (N/A ) ESOPHAGOGASTRODUODENOSCOPY (EGD) WITH PROPOFOL (N/A )  Patient location during evaluation: Endoscopy Anesthesia Type: General Level of consciousness: awake and alert Pain management: pain level controlled Vital Signs Assessment: post-procedure vital signs reviewed and stable Respiratory status: spontaneous breathing and respiratory function stable Cardiovascular status: stable Anesthetic complications: no   No complications documented.   Last Vitals:  Vitals:   02/09/20 0910 02/09/20 0920  BP: 96/61 133/67  Pulse: 69 67  Resp: 16 18  Temp: (!) 36.4 C   SpO2: 97% 99%    Last Pain:  Vitals:   02/09/20 0920  TempSrc:   PainSc: 0-No pain                 Ichiro Chesnut K

## 2020-02-09 NOTE — H&P (Signed)
Cephas Darby, MD [address]  [place]  [place], [place] [address]  Main: [phone]  Fax: [phone] Pager: [phone]  Primary Care Physician:  Valerie Roys, DO Primary Gastroenterologist:  Dr. Cephas Darby  Pre-Procedure History & Physical: HPI:  Marie Parker is a 74 y.o. female is here for an endoscopy and colonoscopy.   Past Medical History:  Diagnosis Date  . Anxiety   . Arthritis   . Breast cancer of upper-outer quadrant of left female breast ([place]) 04/22/2018   16 mm, T1c, N0.  ER/PR positive, HER-2/neu not overexpressed.  Low MammaPrint score.    [name] CAD (coronary artery disease)   . Cancer (HCC)    squamous cell  . Cataract   . Cholecystitis 04/10/2018  . Chronic kidney disease   . Colon cancer screening 2017?   negative cologuard  . Depression   . Family history of adverse reaction to anesthesia    mother had N/V  . Generalized osteoarthritis   . GERD (gastroesophageal reflux disease)   . Gout   . History of endoscopy 11/2012   Gastritis  . Hyperlipidemia   . Hypertension   . IFG (impaired fasting glucose)   . Insomnia   . Menopause   . Morton's neuroma of right foot 11/28/2019  . Osteoporosis   . Parathyroid adenoma   . Personal history of radiation therapy 2020   Left breast  . Personal history of tobacco use, presenting hazards to health 02/02/2016  . PVD (peripheral vascular disease) ([place])   . PVD (peripheral vascular disease) ([place])   . Rotator cuff syndrome of shoulder and allied disorders   . Tobacco abuse     Past Surgical History:  Procedure Laterality Date  . BREAST BIOPSY Left 2014   COMPLEX FIBROADENOMA  . BREAST BIOPSY Left 04/05/2018   [facility]  . BREAST EXCISIONAL BIOPSY Left 1970's   neg  . BREAST FIBROADENOMA SURGERY    . BREAST MAMMOSITE Left 04/2018  . CARDIAC CATHETERIZATION  09/17/2007   Insignificant CAD with 40-50% stenosis of mid LAD.   [name] ILIAC ARTERY STENT     right iliac artery stent   . MASTECTOMY,  PARTIAL Left 04/22/2018   Procedure: MASTECTOMY PARTIAL;  Surgeon: Robert Bellow, MD;  Location: [facility] ORS;  Service: General;  Laterality: Left;  . ORIF PATELLA Left 03/22/2016   Dr. Tristan Schroeder  . PARATHYROIDECTOMY  2012   [facility]  . SENTINEL NODE BIOPSY Left 04/22/2018   Procedure: SENTINEL NODE BIOPSY;  Surgeon: Robert Bellow, MD;  Location: [facility] ORS;  Service: General;  Laterality: Left;  . SKIN BIOPSY     precancerous    Prior to Admission medications   Medication Sig Start Date End Date Taking? Authorizing Provider  acetaminophen (TYLENOL) 650 MG CR tablet Take 1,950 mg by mouth daily as needed for pain.   Yes [provider]  amitriptyline (ELAVIL) 25 MG tablet Take 1 tablet (25 mg total) by mouth at bedtime. 02/02/20 03/03/20 Yes Primrose Oler, Tally Due, MD  amLODipine (NORVASC) 10 MG tablet TAKE 1 TABLET BY MOUTH  DAILY 01/27/20  Yes Johnson, Megan P, DO  aspirin 81 MG tablet Take 1 tablet (81 mg total) by mouth daily. Patient taking differently: Take 81 mg by mouth at bedtime.  12/27/17  Yes Johnson, Megan P, DO  atenolol (TENORMIN) 50 MG tablet TAKE 1 TABLET BY MOUTH  DAILY 01/27/20  Yes Johnson, Megan P, DO  atorvastatin (LIPITOR) 40 MG tablet TAKE 1 TABLET BY  MOUTH  DAILY 01/27/20  Yes Johnson, Megan P, DO  benazepril (LOTENSIN) 40 MG tablet TAKE 1 TABLET BY MOUTH  DAILY 01/27/20  Yes Johnson, Megan P, DO  CALCIUM PO Take by mouth.   Yes [provider]  clobetasol ointment (TEMOVATE) 0.05 % Daily at night for 6-12 weeks, if improvement, decrease to 2-3x per week 09/04/19  Yes Johnson, Megan P, DO  clonazePAM (KLONOPIN) 0.5 MG tablet Take 0.5-1 tablets (0.25-0.5 mg total) by mouth 2 (two) times daily as needed for anxiety. 10/10/19  Yes Johnson, Megan P, DO  diphenhydrAMINE (BENADRYL) 25 mg capsule Take 25 mg by mouth at bedtime.    Yes [provider]  fluticasone (FLONASE) 50 MCG/ACT nasal spray Place 2 sprays into both nostrils daily. 10/10/19  Yes Johnson, Megan P, DO    hydrALAZINE (APRESOLINE) 25 MG tablet TAKE 1 TABLET BY MOUTH  TWICE DAILY 01/27/20  Yes Johnson, Megan P, DO  letrozole (FEMARA) 2.5 MG tablet Take 1 tablet (2.5 mg total) by mouth daily. 05/21/19  Yes Chrystal, Eulas Post, MD  Loperamide HCl (IMODIUM PO) Take by mouth.   Yes [provider]  niacin 500 MG tablet Take 1,000 mg by mouth at bedtime.   Yes [provider]  nystatin (MYCOSTATIN) 100000 UNIT/ML suspension Take 5 mLs (500,000 Units total) by mouth 4 (four) times daily. 12/18/19  Yes Johnson, Megan P, DO  omeprazole (PRILOSEC) 40 MG capsule Take 1 capsule (40 mg total) by mouth daily before breakfast. 01/27/20 02/26/20 Yes Brantleigh Mifflin, Tally Due, MD  Probiotic Product (PROBIOTIC-10 PO) Take by mouth daily.   Yes [provider]  sertraline (ZOLOFT) 100 MG tablet Take 1.5 tablets (150 mg total) by mouth daily. 01/14/20  Yes Johnson, Megan P, DO  tiZANidine (ZANAFLEX) 4 MG tablet TAKE 1 TABLET(4 MG) BY MOUTH EVERY 6 HOURS AS NEEDED FOR MUSCLE SPASMS 12/27/17  Yes Johnson, Megan P, DO  traMADol (ULTRAM) 50 MG tablet Take 50 mg by mouth every 6 (six) hours. 11/21/19  Yes [provider]  Turmeric 500 MG TABS Take 500 mg by mouth daily.   Yes [provider]    Allergies as of 01/13/2020 - Review Complete 01/13/2020  Allergen Reaction Noted  . Amoxicillin-pot clavulanate Diarrhea 12/02/2019  . Crestor [rosuvastatin] Nausea Only 01/21/2013  . Cyclobenzaprine  03/24/2015  . Erythromycin  01/21/2013  . Fosamax [alendronate sodium] Nausea And Vomiting 01/21/2013    Family History  Problem Relation Age of Onset  . Heart attack Father 88  . Alcohol abuse Father   . Heart disease Father   . Liver disease Father   . Hypertension Father   . Heart disease Sister 106       CAD; s/p stent placement  . Stroke Sister   . AAA (abdominal aortic aneurysm) Sister   . Dementia Sister   . Stroke Mother   . Hypertension Mother   . Diabetes Mother   . Stroke Maternal  Grandmother   . Heart attack Maternal Grandfather   . Stroke Paternal Grandmother   . Breast cancer Maternal Aunt 65  . Breast cancer Paternal Aunt   . Bone cancer Paternal Uncle     Social History   Socioeconomic History  . Marital status: Married    Spouse name: Not on file  . Number of children: Not on file  . Years of education: Not on file  . Highest education level: High school graduate  Occupational History  . Occupation: part time Psychologist, prison and probation services  Tobacco Use  . Smoking status: Former Smoker    Packs/day: 1.00    Years: 42.00    Pack years: 42.00    Types: Cigarettes    Quit date: 08/28/2000    Years since quitting: 19.4  . Smokeless tobacco: Never Used  Vaping Use  . Vaping Use: Never used  Substance and Sexual Activity  . Alcohol use: No  . Drug use: No  . Sexual activity: Never  Other Topics Concern  . 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 Difficulty of Paying Living Expenses: Not hard at all  Food Insecurity: No Food Insecurity  . Worried About Charity fundraiser in the Last Year: Never true  . Ran Out of Food in the Last Year: Never true  Transportation Needs: No Transportation Needs  . Lack of Transportation (Medical): No  . Lack of Transportation (Non-Medical): No  Physical Activity: Inactive  . Days of Exercise per Week: 0 days  . Minutes of Exercise per Session: 0 min  Stress: Stress Concern Present  . Feeling of Stress : To some extent  Social Connections: Socially Integrated  . Frequency of Communication with Friends and Family: More than three times a week  . Frequency of Social Gatherings with Friends and Family: More than three times a week  . Attends Religious Services: More than 4 times per year  . Active Member of Clubs or Organizations: Yes  . Attends Archivist Meetings: More than 4 times  per year  . Marital Status: Married  Human resources officer Violence: Not At Risk  . Fear of Current or Ex-Partner: No  . Emotionally Abused: No  . Physically Abused: No  . Sexually Abused: No    Review of Systems: See HPI, otherwise negative ROS  Physical Exam: BP (!) 142/71   Pulse 87   Temp 97.7 F (36.5 C) (Temporal)   Resp 18   Ht 4' 11.5" (1.511 m)   Wt 60.8 kg   SpO2 99%   BMI 26.61 kg/m  General:   Alert,  pleasant and cooperative in NAD Head:  Normocephalic and atraumatic. Neck:  Supple; no masses or thyromegaly. Lungs:  Clear throughout to auscultation.    Heart:  Regular rate and rhythm. Abdomen:  Soft, nontender and nondistended. Normal bowel sounds, without guarding, and without rebound.   Neurologic:  Alert and  oriented x4;  grossly normal neurologically.  Impression/Plan: Marie Parker is here for an endoscopy and colonoscopy to be performed for Chronic GERD, Personal history of tubular adenomas of the colon  Risks, benefits, limitations, and alternatives regarding  endoscopy and colonoscopy have been reviewed with the patient.  Questions have been answered.  All parties agreeable.   Sherri Sear, MD  02/09/2020, 8:31 AM

## 2020-02-09 NOTE — Op Note (Signed)
[facility] Gastroenterology Patient Name: Marie Parker Procedure Date: 02/09/2020 8:34 AM MRN: [id number] Account #: [id number] Date of Birth: 05-14-1946 Admit Type: Outpatient Age: 74 Room: [facility] ENDO ROOM 1 Gender: Female Note Status: Finalized Procedure:             Colonoscopy Indications:           High risk colon cancer surveillance: Personal history                         of adenoma less than 10 mm in size Providers:             Lin Landsman MD, MD Medicines:             Monitored Anesthesia Care Complications:         No immediate complications. Estimated blood loss: None. Procedure:             Pre-Anesthesia Assessment:                        - Prior to the procedure, a History and Physical was                         performed, and patient medications and allergies were                         reviewed. The patient is competent. The risks and                         benefits of the procedure and the sedation options and                         risks were discussed with the patient. All questions                         were answered and informed consent was obtained.                         Patient identification and proposed procedure were                         verified by the physician, the nurse, the                         anesthesiologist, the anesthetist and the technician                         in the pre-procedure area in the procedure room in the                         endoscopy suite. Mental Status Examination: alert and                         oriented. Airway Examination: normal oropharyngeal                         airway and neck mobility. Respiratory Examination:                         clear to  auscultation. CV Examination: normal.                         Prophylactic Antibiotics: The patient does not require                         prophylactic antibiotics. Prior Anticoagulants: The                         patient has  taken no previous anticoagulant or                         antiplatelet agents. ASA Grade Assessment: III - A                         patient with severe systemic disease. After reviewing                         the risks and benefits, the patient was deemed in                         satisfactory condition to undergo the procedure. The                         anesthesia plan was to use monitored anesthesia care                         (MAC). Immediately prior to administration of                         medications, the patient was re-assessed for adequacy                         to receive sedatives. The heart rate, respiratory                         rate, oxygen saturations, blood pressure, adequacy of                         pulmonary ventilation, and response to care were                         monitored throughout the procedure. The physical                         status of the patient was re-assessed after the                         procedure.                        After obtaining informed consent, the colonoscope was                         passed under direct vision. Throughout the procedure,                         the patient's blood pressure, pulse, and oxygen  saturations were monitored continuously. The                         Colonoscope was introduced through the anus and                         advanced to the the cecum, identified by appendiceal                         orifice and ileocecal valve. The colonoscopy was                         performed without difficulty. The patient tolerated                         the procedure well. The quality of the bowel                         preparation was evaluated using the BBPS [facility] Bowel                         Preparation Scale) with scores of: Right Colon = 3                         (entire mucosa seen well with no residual staining,                         small fragments of stool or opaque  liquid), Transverse                         Colon = 3 (entire mucosa seen well with no residual                         staining, small fragments of stool or opaque liquid)                         and Left Colon = 2 (minor amount of residual staining,                         small fragments of stool and/or opaque liquid, but                         mucosa seen well). The total BBPS score equals 8. Findings:      The perianal and digital rectal examinations were normal. Pertinent       negatives include normal sphincter tone and no palpable rectal lesions.      Three sessile polyps were found in the descending colon. The polyps were       3 to 5 mm in size. These polyps were removed with a cold snare.       Resection and retrieval were complete.      Multiple diverticula were found in the sigmoid colon and descending       colon.      The retroflexed view of the distal rectum and anal verge was normal and       showed no anal or rectal abnormalities. Impression:            -  Three 3 to 5 mm polyps in the descending colon,                         removed with a cold snare. Resected and retrieved.                        - Diverticulosis in the sigmoid colon and in the                         descending colon.                        - The distal rectum and anal verge are normal on                         retroflexion view. Recommendation:        - Discharge patient to home (with escort).                        - Resume previous diet today.                        - Continue present medications.                        - Await pathology results.                        - Repeat colonoscopy in 7 years for surveillance.                        - Return to my office as previously scheduled. Procedure Code(s):     --- Professional ---                        [phone], Colonoscopy, flexible; with removal of                         tumor(s), polyp(s), or other lesion(s) by snare                          technique Diagnosis Code(s):     --- Professional ---                        Z86.010, Personal history of colonic polyps                        K63.5, Polyp of colon                        K57.30, Diverticulosis of large intestine without                         perforation or abscess without bleeding CPT copyright 2019 American Medical Association. All rights reserved. The codes documented in this report are preliminary and upon coder review may  be revised to meet current compliance requirements. Dr. Ulyess Mort Lin Landsman MD, MD 02/09/2020 9:07:30 AM This report has been signed electronically. Number of Addenda: 0 Note Initiated On: 02/09/2020 8:34 AM Scope Withdrawal Time: 0 hours 10 minutes  59 seconds  Total Procedure Duration: 0 hours 14 minutes 20 seconds  Estimated Blood Loss:  Estimated blood loss: none.      [facility]

## 2020-02-09 NOTE — Transfer of Care (Signed)
Immediate Anesthesia Transfer of Care Note  Patient: Chakara Bognar  Procedure(s) Performed: COLONOSCOPY WITH PROPOFOL (N/A ) ESOPHAGOGASTRODUODENOSCOPY (EGD) WITH PROPOFOL (N/A )  Patient Location: PACU and Endoscopy Unit  Anesthesia Type:General  Level of Consciousness: drowsy  Airway & Oxygen Therapy: Patient Spontanous Breathing  Post-op Assessment: Report given to RN and Post -op Vital signs reviewed and stable  Post vital signs: Reviewed and stable  Last Vitals:  Vitals Value Taken Time  BP 96/61 02/09/20 0910  Temp 36.4 C 02/09/20 0910  Pulse 69 02/09/20 0910  Resp 16 02/09/20 0910  SpO2 97 % 02/09/20 0910  Vitals shown include unvalidated device data.  Last Pain:  Vitals:   02/09/20 0910  TempSrc: Temporal  PainSc: Asleep         Complications: No complications documented.

## 2020-02-09 NOTE — Anesthesia Preprocedure Evaluation (Signed)
Anesthesia Evaluation  Patient identified by MRN, date of birth, ID band Patient awake    Reviewed: Allergy & Precautions, NPO status , Patient's Chart, lab work & pertinent test results  History of Anesthesia Complications Negative for: history of anesthetic complications  Airway Mallampati: II       Dental  (+) Upper Dentures, Lower Dentures   Pulmonary neg sleep apnea, neg COPD, Not current smoker, former smoker,           Cardiovascular hypertension, Pt. on medications and Pt. on home beta blockers + Peripheral Vascular Disease (s/p iliac stent)  (-) Past MI and (-) CHF (-) dysrhythmias + Valvular Problems/Murmurs (murmur, no tx)      Neuro/Psych neg Seizures Anxiety Depression    GI/Hepatic Neg liver ROS, GERD  Medicated,  Endo/Other  neg diabetes  Renal/GU Renal InsufficiencyRenal disease     Musculoskeletal   Abdominal   Peds  Hematology   Anesthesia Other Findings   Reproductive/Obstetrics                             Anesthesia Physical Anesthesia Plan  ASA: III  Anesthesia Plan: General   Post-op Pain Management:    Induction:   PONV Risk Score and Plan: 3 and Propofol infusion, TIVA and Treatment may vary due to age or medical condition  Airway Management Planned:   Additional Equipment:   Intra-op Plan:   Post-operative Plan:   Informed Consent: I have reviewed the patients History and Physical, chart, labs and discussed the procedure including the risks, benefits and alternatives for the proposed anesthesia with the patient or authorized representative who has indicated his/her understanding and acceptance.       Plan Discussed with:   Anesthesia Plan Comments:         Anesthesia Quick Evaluation

## 2020-02-10 ENCOUNTER — Encounter: Payer: Self-pay | Admitting: Gastroenterology

## 2020-02-10 ENCOUNTER — Other Ambulatory Visit: Payer: Self-pay

## 2020-02-10 ENCOUNTER — Ambulatory Visit: Payer: Medicare Other | Admitting: Gastroenterology

## 2020-02-10 DIAGNOSIS — K219 Gastro-esophageal reflux disease without esophagitis: Secondary | ICD-10-CM

## 2020-02-10 LAB — SURGICAL PATHOLOGY

## 2020-02-10 MED ORDER — OMEPRAZOLE 40 MG PO CPDR: 40.0000 mg | DELAYED_RELEASE_CAPSULE | Freq: Every day | ORAL | 1 refills | Status: DC

## 2020-02-10 NOTE — Telephone Encounter (Signed)
Last office visit 01/13/2020 GERD  Pharmacy requested a refill for once a  Day  Last refill 02/09/2020 0 refills twice a day. Changing to once a day per note in last office visit

## 2020-02-20 ENCOUNTER — Encounter: Payer: Self-pay | Admitting: Family Medicine

## 2020-02-20 ENCOUNTER — Other Ambulatory Visit: Payer: Self-pay

## 2020-02-20 ENCOUNTER — Ambulatory Visit (INDEPENDENT_AMBULATORY_CARE_PROVIDER_SITE_OTHER): Payer: Medicare Other | Admitting: Family Medicine

## 2020-02-20 DIAGNOSIS — I129 Hypertensive chronic kidney disease with stage 1 through stage 4 chronic kidney disease, or unspecified chronic kidney disease: Secondary | ICD-10-CM | POA: Diagnosis not present

## 2020-02-20 DIAGNOSIS — F411 Generalized anxiety disorder: Secondary | ICD-10-CM | POA: Diagnosis not present

## 2020-02-20 NOTE — Progress Notes (Signed)
BP (!) 163/82 (BP Location: Left Arm, Cuff Size: Normal)   Pulse 63   Temp 98.5 F (36.9 C) (Oral)   Wt 133 lb 3.2 oz (60.4 kg)   SpO2 98%   BMI 26.45 kg/m    Subjective:    Patient ID: Marie Parker, female    DOB: [birth year], 74 y.o.   MRN: [id number]  HPI: Marie Parker is a 74 y.o. female  Chief Complaint  Patient presents with  . Anxiety   ANXIETY/DEPRESSION- was doing really well for about 2 weeks, then had 1 bad day.  Duration:better Anxious mood: yes  Excessive worrying: yes Irritability: no  Sweating: no Nausea: no Palpitations:no Hyperventilation: no Panic attacks: no Agoraphobia: no  Obscessions/compulsions: yes Depressed mood: yes Depression screen [facility] 2/9 02/20/2020 01/23/2020 01/14/2020 09/17/2019 07/31/2019  Decreased Interest 1 2 2  0 0  Down, Depressed, Hopeless 2 0 0 0 1  PHQ - 2 Score 3 2 2  0 1  Altered sleeping 0 0 0 - 0  Tired, decreased energy 1 2 2  - 0  Change in appetite 0 1 1 - 0  Feeling bad or failure about yourself  0 2 2 - 0  Trouble concentrating 0 0 0 - 0  Moving slowly or fidgety/restless 0 2 2 - 0  Suicidal thoughts 0 0 0 - 0  PHQ-9 Score 4 9 9  - 1  Difficult doing work/chores Somewhat difficult Not difficult at all Not difficult at all - Not difficult at all  Some recent data might be hidden   GAD 7 : Generalized Anxiety Score 02/20/2020 01/14/2020 07/31/2019 02/24/2019  Nervous, Anxious, on Edge 2 3 1 3   Control/stop worrying 2 3 0 1  Worry too much - different things 2 2 0 1  Trouble relaxing 1 1 0 2  Restless 1 0 0 2  Easily annoyed or irritable 1 1 1 1   Afraid - awful might happen 1 3 1 1   Total GAD 7 Score 10 13 3 11   Anxiety Difficulty Not difficult at all Not difficult at all Not difficult at all Not difficult at all   Anhedonia: no Weight changes: no Insomnia: no   Hypersomnia: no Fatigue/loss of energy: yes Feelings of worthlessness: no Feelings of guilt: no Impaired concentration/indecisiveness: no Suicidal  ideations: no  Crying spells: no Recent Stressors/Life Changes: yes   Relationship problems: yes   Family stress: yes     Financial stress: no    Job stress: no    Recent death/loss:yes   Relevant past medical, surgical, family and social history reviewed and updated as indicated. Interim medical history since our last visit reviewed. Allergies and medications reviewed and updated.  Review of Systems  Constitutional: Negative.   Respiratory: Negative.   Cardiovascular: Negative.   Gastrointestinal: Negative.   Musculoskeletal: Negative.   Skin: Negative.   Neurological: Negative.   Psychiatric/Behavioral: Positive for dysphoric mood. Negative for agitation, behavioral problems, confusion, decreased concentration, hallucinations, self-injury, sleep disturbance and suicidal ideas. The patient is nervous/anxious. The patient is not hyperactive.     Per HPI unless specifically indicated above     Objective:    BP (!) 163/82 (BP Location: Left Arm, Cuff Size: Normal)   Pulse 63   Temp 98.5 F (36.9 C) (Oral)   Wt 133 lb 3.2 oz (60.4 kg)   SpO2 98%   BMI 26.45 kg/m   Wt Readings from Last 3 Encounters:  02/20/20 133 lb 3.2 oz (60.4 kg)  02/09/20 134 lb (  60.8 kg)  01/14/20 133 lb (60.3 kg)    Physical Exam Vitals and nursing note reviewed.  Constitutional:      General: She is not in acute distress.    Appearance: Normal appearance. She is not ill-appearing, toxic-appearing or diaphoretic.  HENT:     Head: Normocephalic and atraumatic.     Right Ear: External ear normal.     Left Ear: External ear normal.     Nose: Nose normal.     Mouth/Throat:     Mouth: Mucous membranes are moist.     Pharynx: Oropharynx is clear.  Eyes:     General: No scleral icterus.       Right eye: No discharge.        Left eye: No discharge.     Extraocular Movements: Extraocular movements intact.     Conjunctiva/sclera: Conjunctivae normal.     Pupils: Pupils are equal, round, and  reactive to light.  Cardiovascular:     Rate and Rhythm: Normal rate and regular rhythm.     Pulses: Normal pulses.     Heart sounds: Normal heart sounds. No murmur heard.  No friction rub. No gallop.   Pulmonary:     Effort: Pulmonary effort is normal. No respiratory distress.     Breath sounds: Normal breath sounds. No stridor. No wheezing, rhonchi or rales.  Chest:     Chest wall: No tenderness.  Musculoskeletal:        General: Normal range of motion.     Cervical back: Normal range of motion and neck supple.  Skin:    General: Skin is warm and dry.     Capillary Refill: Capillary refill takes less than 2 seconds.     Coloration: Skin is not jaundiced or pale.     Findings: No bruising, erythema, lesion or rash.  Neurological:     General: No focal deficit present.     Mental Status: She is alert and oriented to person, place, and time. Mental status is at baseline.  Psychiatric:        Mood and Affect: Mood normal.        Behavior: Behavior normal.        Thought Content: Thought content normal.        Judgment: Judgment normal.     Results for orders placed or performed during the hospital encounter of 02/09/20  Surgical pathology  Result Value Ref Range   SURGICAL PATHOLOGY      SURGICAL PATHOLOGY CASE: [id number] PATIENT: [facility] Surgical Pathology Report     Specimen Submitted: A. Stomach; cbx B. Esophagus; cbx C. Colon polyp x3, descending; cold snare  Clinical History: EGD GERD K21.9 colonoscopy history of colon polyps Z86.010.  Normal EGD, colon polyps, diverticulosis      DIAGNOSIS: A.  STOMACH; COLD BIOPSY: - OXYNTIC MUCOSA WITH FOCAL CHANGES CONSISTENT WITH HEALING MUCOSAL INJURY. - CHANGES CONSISTENT WITH PROTON PUMP INHIBITOR EFFECT. - UNREMARKABLE ANTRAL MUCOSA. - NEGATIVE FOR H. PYLORI, DYSPLASIA, AND MALIGNANCY.  B.  ESOPHAGUS; COLD BIOPSY: - FRAGMENTS OF UNREMARKABLE SQUAMOUS MUCOSA. - NEGATIVE FOR INTRAEPITHELIAL  EOSINOPHILS, DYSPLASIA, AND MALIGNANCY.  C.  COLON POLYPS X3, DESCENDING; COLD SNARE: - TUBULAR ADENOMA (3). - NEGATIVE FOR HIGH-GRADE DYSPLASIA AND MALIGNANCY.   GROSS DESCRIPTION: A. Labeled: Gastric cold biopsies, rule out H. pylori Received: In formalin Tissue fragment(s): 3 Size:  0.5 x 0.4 x 0.2 cm Description: Aggregate of pink-red tissue fragments Entirely submitted in 1 cassette.  B. Labeled: Esophagus cold  biopsies, rule out EOE Received: In formalin Tissue fragment(s): 2 Size: 0.5 x 0.2 x 0.1 cm Description: Aggregate of pink-white tissue fragments Entirely submitted in 1 cassette.  C. Labeled: Descending colon polyp cold snare x3 Received: In formalin Tissue fragment(s): 3 Size: 0.7 x 0.2 x 0.1 cm Description: Aggregate of tan tissue fragments Entirely submitted in 1 cassette.   Final Diagnosis performed by Quay Burow, MD.   Electronically signed 02/10/2020 12:52:22PM The electronic signature indicates that the named Attending Pathologist has evaluated the specimen Technical component performed at [facility], [address] [place], [place] [address] Lab: [phone] Dir: Rush Farmer, MD, MMM  Professional component performed at [facility], [facility], [place], [place], [place] [address] Lab: [phone]  Dir: Dellia Nims. Reuel Derby, MD       Assessment & Plan:   Problem List Items Addressed This Visit      Genitourinary   Benign hypertensive renal disease    Running high today, but has been feeling anxious. Will monitor at home and call if staying high. Seeing cardiology in the next couple of weeks.         Other   GAD (generalized anxiety disorder)    Doing better. Stable on current regimen. Would like to work on changing the way she thinks to be able to get her anxiety under better control. Continue current regimen and recheck in August. Information about counselors given today. Call with any concerns.           Follow up  plan: Return As scheduled.

## 2020-02-20 NOTE — Assessment & Plan Note (Signed)
Running high today, but has been feeling anxious. Will monitor at home and call if staying high. Seeing cardiology in the next couple of weeks.

## 2020-02-20 NOTE — Assessment & Plan Note (Signed)
Doing better. Stable on current regimen. Would like to work on changing the way she thinks to be able to get her anxiety under better control. Continue current regimen and recheck in August. Information about counselors given today. Call with any concerns.

## 2020-02-25 ENCOUNTER — Ambulatory Visit: Payer: Medicare Other | Admitting: Radiation Oncology

## 2020-03-01 DIAGNOSIS — L578 Other skin changes due to chronic exposure to nonionizing radiation: Secondary | ICD-10-CM | POA: Diagnosis not present

## 2020-03-01 DIAGNOSIS — Z859 Personal history of malignant neoplasm, unspecified: Secondary | ICD-10-CM | POA: Diagnosis not present

## 2020-03-01 DIAGNOSIS — Z872 Personal history of diseases of the skin and subcutaneous tissue: Secondary | ICD-10-CM | POA: Diagnosis not present

## 2020-03-04 ENCOUNTER — Encounter: Payer: Self-pay | Admitting: Cardiovascular Disease

## 2020-03-04 ENCOUNTER — Other Ambulatory Visit: Payer: Self-pay

## 2020-03-04 ENCOUNTER — Ambulatory Visit: Payer: Medicare Other | Admitting: Cardiovascular Disease

## 2020-03-04 VITALS — BP 144/70 | HR 63 | Ht 59.5 in | Wt 133.5 lb

## 2020-03-04 DIAGNOSIS — I447 Left bundle-branch block, unspecified: Secondary | ICD-10-CM

## 2020-03-04 DIAGNOSIS — E782 Mixed hyperlipidemia: Secondary | ICD-10-CM | POA: Diagnosis not present

## 2020-03-04 DIAGNOSIS — I739 Peripheral vascular disease, unspecified: Secondary | ICD-10-CM

## 2020-03-04 DIAGNOSIS — I10 Essential (primary) hypertension: Secondary | ICD-10-CM | POA: Diagnosis not present

## 2020-03-04 NOTE — Progress Notes (Signed)
Cardiology Office Note   Date:  03/04/2020   ID:  Marie Parker, DOB 1946/07/20, MRN [id number]  PCP:  Marie Roys, DO  Cardiologist:   Kathlyn Sacramento, MD   Chief Complaint  Patient presents with   office visit    Pt has no complaints. Meds verbally reviewed w/ pt.      History of Present Illness: Marie Parker is a 74 y.o. female who is here for a follow up visit regarding left bundle branch block, pericardial disease and previous atypical chest pain. She has history of GERD, prior tobacco use and breast cancer status post mastectomy. She has remote history of right iliac stenting in the early 2000. She had cardiac catheterization done in 2009 which showed 40% LAD stenosis. She was seen in 2018 for atypical chest pain.  She underwent a Lexiscan Myoview which showed no evidence of ischemia with normal ejection fraction. She has been doing well overall with no recent chest pain, shortness of breath or palpitations.  No dizziness or syncope.  She continues to be under stress dealing with her sick husband who has severe pulmonary hypertension.     Past Medical History:  Diagnosis Date   Anxiety    Arthritis    Breast cancer of upper-outer quadrant of left female breast ([place]) 04/22/2018   16 mm, T1c, N0.  ER/PR positive, HER-2/neu not overexpressed.  Low MammaPrint score.     CAD (coronary artery disease)    Cancer (HCC)    squamous cell   Cataract    Cholecystitis 04/10/2018   Chronic kidney disease    Colon cancer screening 2017?   negative cologuard   Depression    Family history of adverse reaction to anesthesia    mother had N/V   Generalized osteoarthritis    GERD (gastroesophageal reflux disease)    Gout    History of endoscopy 11/2012   Gastritis   Hyperlipidemia    Hypertension    IFG (impaired fasting glucose)    Insomnia    Menopause    Morton's neuroma of right foot 11/28/2019   Osteoporosis    Parathyroid adenoma     Personal history of radiation therapy 2020   Left breast   Personal history of tobacco use, presenting hazards to health 02/02/2016   PVD (peripheral vascular disease) ([place])    PVD (peripheral vascular disease) ([place])    Rotator cuff syndrome of shoulder and allied disorders    Tobacco abuse     Past Surgical History:  Procedure Laterality Date   BREAST BIOPSY Left 2014   COMPLEX FIBROADENOMA   BREAST BIOPSY Left 04/05/2018   [facility]   BREAST EXCISIONAL BIOPSY Left 1970's   neg   BREAST FIBROADENOMA SURGERY     BREAST MAMMOSITE Left 04/2018   CARDIAC CATHETERIZATION  09/17/2007   Insignificant CAD with 40-50% stenosis of mid LAD.    COLONOSCOPY WITH PROPOFOL N/A 02/09/2020   Procedure: COLONOSCOPY WITH PROPOFOL;  Surgeon: Lin Landsman, MD;  Location: [facility] END[facility]COPY;  Service: Gastroenterology;  Laterality: N/A;   ESOPHAGOGASTRODUODENOSCOPY (EGD) WITH PROPOFOL N/A 02/09/2020   Procedure: ESOPHAGOGASTRODUODENOSCOPY (EGD) WITH PROPOFOL;  Surgeon: Lin Landsman, MD;  Location: [facility] ENDOSCOPY;  Service: Gastroenterology;  Laterality: N/A;   ILIAC ARTERY STENT     right iliac artery stent    MASTECTOMY, PARTIAL Left 04/22/2018   Procedure: MASTECTOMY PARTIAL;  Surgeon: Robert Bellow, MD;  Location: [facility] ORS;  Service: General;  Laterality: Left;   ORIF PATELLA Left  03/22/2016   Dr. Tristan Schroeder   PARATHYROIDECTOMY  2012   [facility]   SENTINEL NODE BIOPSY Left 04/22/2018   Procedure: SENTINEL NODE BIOPSY;  Surgeon: Robert Bellow, MD;  Location: [facility] ORS;  Service: General;  Laterality: Left;   SKIN BIOPSY     precancerous     Current Outpatient Medications  Medication Sig Dispense Refill   acetaminophen (TYLENOL) 650 MG CR tablet Take 1,950 mg by mouth daily as needed for pain.     amitriptyline (ELAVIL) 25 MG tablet Take 1 tablet (25 mg total) by mouth at bedtime. 90 tablet 0   amLODipine (NORVASC) 10 MG tablet TAKE 1 TABLET BY MOUTH  DAILY 90 tablet 0    aspirin 81 MG tablet Take 1 tablet (81 mg total) by mouth daily. (Patient taking differently: Take 81 mg by mouth at bedtime. ) 90 tablet 3   atenolol (TENORMIN) 50 MG tablet TAKE 1 TABLET BY MOUTH  DAILY 90 tablet 0   atorvastatin (LIPITOR) 40 MG tablet TAKE 1 TABLET BY MOUTH  DAILY 90 tablet 0   benazepril (LOTENSIN) 40 MG tablet TAKE 1 TABLET BY MOUTH  DAILY 90 tablet 0   CALCIUM PO Take by mouth.     clobetasol ointment (TEMOVATE) 0.05 % Daily at night for 6-12 weeks, if improvement, decrease to 2-3x per week 60 g 1   clonazePAM (KLONOPIN) 0.5 MG tablet Take 0.5-1 tablets (0.25-0.5 mg total) by mouth 2 (two) times daily as needed for anxiety. 30 tablet 0   fluticasone (FLONASE) 50 MCG/ACT nasal spray Place 2 sprays into both nostrils daily. 48 g 12   hydrALAZINE (APRESOLINE) 25 MG tablet TAKE 1 TABLET BY MOUTH  TWICE DAILY 180 tablet 0   letrozole (FEMARA) 2.5 MG tablet Take 1 tablet (2.5 mg total) by mouth daily. 30 tablet 12   Loperamide HCl (IMODIUM PO) Take by mouth.      niacin 500 MG tablet Take 1,000 mg by mouth at bedtime.     nystatin (MYCOSTATIN) 100000 UNIT/ML suspension Take 5 mLs (500,000 Units total) by mouth 4 (four) times daily. 60 mL 0   omeprazole (PRILOSEC) 40 MG capsule Take 1 capsule (40 mg total) by mouth daily before breakfast. 30 capsule 1   Probiotic Product (PROBIOTIC-10 PO) Take by mouth daily.     sertraline (ZOLOFT) 100 MG tablet Take 1.5 tablets (150 mg total) by mouth daily. 135 tablet 1   tiZANidine (ZANAFLEX) 4 MG tablet TAKE 1 TABLET(4 MG) BY MOUTH EVERY 6 HOURS AS NEEDED FOR MUSCLE SPASMS 30 tablet 3   traMADol (ULTRAM) 50 MG tablet Take 50 mg by mouth every 6 (six) hours.     Turmeric 500 MG TABS Take 500 mg by mouth daily.     No current facility-administered medications for this visit.    Allergies:   Amoxicillin-pot clavulanate, Crestor [rosuvastatin], Cyclobenzaprine, Erythromycin, and Fosamax [alendronate sodium]    Social  History:  The patient  reports that she quit smoking about 19 years ago. Her smoking use included cigarettes. She has a 42.00 pack-year smoking history. She has never used smokeless tobacco. She reports that she does not drink alcohol and does not use drugs.   Family History:  The patient's family history includes AAA (abdominal aortic aneurysm) in her sister; Alcohol abuse in her father; Bone cancer in her paternal uncle; Breast cancer in her paternal aunt; Breast cancer (age of onset: 69) in her maternal aunt; Dementia in her sister; Diabetes in her mother; Heart attack  in her maternal grandfather; Heart attack (age of onset: 78) in her father; Heart disease in her father; Heart disease (age of onset: 23) in her sister; Hypertension in her father and mother; Liver disease in her father; Stroke in her maternal grandmother, mother, paternal grandmother, and sister.    ROS:  Please see the history of present illness.   Otherwise, review of systems are positive for none.   All other systems are reviewed and negative.    PHYSICAL EXAM: VS:  BP (!) 144/70 (BP Location: Left Arm, Patient Position: Sitting, Cuff Size: Normal)    Pulse 63    Ht 4' 11.5" (1.511 m)    Wt 133 lb 8 oz (60.6 kg)    SpO2 98%    BMI 26.51 kg/m  , BMI Body mass index is 26.51 kg/m. GEN: Well nourished, well developed, in no acute distress  HEENT: normal  Neck: no JVD, carotid bruits, or masses Cardiac: RRR; no murmurs, rubs, or gallops,no edema  Respiratory:  clear to auscultation bilaterally, normal work of breathing GI: soft, nontender, nondistended, + BS MS: no deformity or atrophy  Skin: warm and dry, no rash Neuro:  Strength and sensation are intact Psych: euthymic mood, full affect Vascular:  Distal pulses are palpable.  EKG:  EKG is ordered today. The ekg ordered today demonstrates  normal sinus rhythm with left bundle branch block.   Recent Labs: 10/10/2019: ALT 17; BUN 8; Hemoglobin 13.4; Platelets 211;  Potassium 4.0; Sodium 140; TSH 1.860 12/22/2019: Creatinine, Ser 0.98    Lipid Panel    Component Value Date/Time   CHOL 138 10/10/2019 0925   CHOL 129 06/21/2016 0839   TRIG 153 (H) 10/10/2019 0925   TRIG 136 06/21/2016 0839   HDL 56 10/10/2019 0925   VLDL 27 06/21/2016 0839   LDLCALC 56 10/10/2019 0925      Wt Readings from Last 3 Encounters:  03/04/20 133 lb 8 oz (60.6 kg)  02/20/20 133 lb 3.2 oz (60.4 kg)  02/09/20 134 lb (60.8 kg)       PAD Screen 12/28/2016  Previous PAD dx? No  Previous surgical procedure? Yes  Dates of procedures iliac stent over 15-20 years ago.  Pain with walking? Yes  Subsides with rest? Yes  Feet/toe relief with dangling? No  Painful, non-healing ulcers? No  Extremities discolored? No      ASSESSMENT AND PLAN:  1.  Left bundle branch block: Currently with no symptoms suggestive of arrhythmia.  2. Essential hypertension: Blood pressure is mildly elevated but she has been under stress lately.  Continue to monitor for now.  3. Peripheral arterial disease: Previous iliac stenting with no recurrent claudication. Continue aspirin.  She has normal distal pulses.  4. Hyperlipidemia: Continue treatment with atorvastatin .  Most recent lipid profile showed an LDL of 56.    Disposition:   FU with me in 12 months  Signed,  Kathlyn Sacramento, MD  03/04/2020 9:26 AM    [place]

## 2020-03-04 NOTE — Patient Instructions (Signed)

## 2020-03-08 ENCOUNTER — Encounter: Payer: Self-pay | Admitting: Radiation Oncology

## 2020-03-08 ENCOUNTER — Other Ambulatory Visit: Payer: Self-pay

## 2020-03-08 ENCOUNTER — Ambulatory Visit
Admission: RE | Admit: 2020-03-08 | Discharge: 2020-03-08 | Disposition: A | Discharge status: Home / Self Care | Payer: Medicare Other | Source: Ambulatory Visit | Attending: Radiation Oncology | Admitting: Radiation Oncology

## 2020-03-08 VITALS — BP 131/68 | HR 68 | Temp 97.9°F | Wt 135.0 lb

## 2020-03-08 DIAGNOSIS — C50812 Malignant neoplasm of overlapping sites of left female breast: Secondary | ICD-10-CM | POA: Diagnosis not present

## 2020-03-08 DIAGNOSIS — Z170 Estrogen receptor positive status [ER+]: Secondary | ICD-10-CM | POA: Insufficient documentation

## 2020-03-08 DIAGNOSIS — Z79811 Long term (current) use of aromatase inhibitors: Secondary | ICD-10-CM | POA: Insufficient documentation

## 2020-03-08 DIAGNOSIS — Z923 Personal history of irradiation: Secondary | ICD-10-CM | POA: Insufficient documentation

## 2020-03-08 NOTE — Progress Notes (Signed)
Radiation Oncology Follow up Note  Name: Marie Parker   Date:   03/08/2020 MRN:  [id number] DOB: [birth year]    This 74 y.o. female presents to the clinic today for 2-year follow-up status post accelerated partial breast radiation to her left breast for stage I ER/PR positive invasive mammary carcinoma.  REFERRING PROVIDER: Valerie Roys, DO  HPI: Patient is a 74 year old female now out 2 years having completed accelerated partial breast radiation to her left breast for stage I invasive mammary carcinoma seen today in routine follow-up she is doing well.  She specifically denies breast tenderness cough or bone pain.  She is not had a mammogram in close to a year we are scheduling that through Dr. Barbette Hair office.  She is also currently on letrozole tolerating that well without side effect..  Her last mammogram which I have reviewed was BI-RADS 2 benign.  COMPLICATIONS OF TREATMENT: none  FOLLOW UP COMPLIANCE: keeps appointments   PHYSICAL EXAM:  BP 131/68 (BP Location: Left Arm, Patient Position: Sitting, Cuff Size: Normal)   Pulse 68   Temp 97.9 F (36.6 C) (Tympanic)   Wt 135 lb (61.2 kg)   BMI 26.81 kg/m  Lungs are clear to A&P cardiac examination essentially unremarkable with regular rate and rhythm. No dominant mass or nodularity is noted in either breast in 2 positions examined. Incision is well-healed. No axillary or supraclavicular adenopathy is appreciated. Cosmetic result is excellent.  Well-developed well-nourished patient in NAD. HEENT reveals PERLA, EOMI, discs not visualized.  Oral cavity is clear. No oral mucosal lesions are identified. Neck is clear without evidence of cervical or supraclavicular adenopathy. Lungs are clear to A&P. Cardiac examination is essentially unremarkable with regular rate and rhythm without murmur rub or thrill. Abdomen is benign with no organomegaly or masses noted. Motor sensory and DTR levels are equal and symmetric in the upper and lower  extremities. Cranial nerves II through XII are grossly intact. Proprioception is intact. No peripheral adenopathy or edema is identified. No motor or sensory levels are noted. Crude visual fields are within normal range.  RADIOLOGY RESULTS: Mammograms reviewed compatible with above-stated findings  PLAN: Present time patient is doing well with no evidence of disease.  She will make follow-up appoint with Dr. Tollie Pizza and have mammograms ordered through his office.  He also will be reordering her letrozole.  I have asked to see the patient back in 1 year for follow-up.  Patient knows to call with any concerns.  I would like to take this opportunity to thank you for allowing me to participate in the care of your patient.Noreene Filbert, MD

## 2020-03-10 ENCOUNTER — Other Ambulatory Visit: Payer: Self-pay | Admitting: General Surgery

## 2020-03-10 DIAGNOSIS — M19071 Primary osteoarthritis, right ankle and foot: Secondary | ICD-10-CM | POA: Diagnosis not present

## 2020-03-10 DIAGNOSIS — C50812 Malignant neoplasm of overlapping sites of left female breast: Secondary | ICD-10-CM

## 2020-03-12 ENCOUNTER — Telehealth: Payer: Self-pay

## 2020-03-12 ENCOUNTER — Ambulatory Visit: Payer: Self-pay | Admitting: General Practice

## 2020-03-12 NOTE — Chronic Care Management (AMB) (Signed)
  Chronic Care Management   Outreach Note  03/12/2020 Name: Marie Parker MRN: [id number] DOB: February 12, 1946  Referred by: Valerie Roys, DO Reason for referral : Chronic Care Management (RNCM Follow up: Chronic Disease Management and Care Coordination Needs)   An unsuccessful telephone outreach was attempted today. The patient was referred to the case management team for assistance with care management and care coordination.   Follow Up Plan: A HIPPA compliant phone message was left for the patient providing contact information and requesting a return call.   Noreene Larsson RN, MSN, [facility] Mobile: [phone]

## 2020-03-24 ENCOUNTER — Other Ambulatory Visit: Payer: Self-pay

## 2020-03-24 DIAGNOSIS — F411 Generalized anxiety disorder: Secondary | ICD-10-CM

## 2020-03-24 DIAGNOSIS — K219 Gastro-esophageal reflux disease without esophagitis: Secondary | ICD-10-CM

## 2020-03-24 MED ORDER — AMITRIPTYLINE HCL 25 MG PO TABS: 25.0000 mg | ORAL_TABLET | Freq: Every day | ORAL | 0 refills | Status: DC

## 2020-03-24 MED ORDER — OMEPRAZOLE 40 MG PO CPDR: 40.0000 mg | DELAYED_RELEASE_CAPSULE | Freq: Two times a day (BID) | ORAL | 0 refills | Status: DC

## 2020-03-24 NOTE — Telephone Encounter (Signed)
Last office visit 01/13/2020 GERD  Last refill Elavil 02/02/2020 0 refills 90 tab  Omeprazole is once a day. She states you changed her to twice a day

## 2020-04-05 ENCOUNTER — Ambulatory Visit (INDEPENDENT_AMBULATORY_CARE_PROVIDER_SITE_OTHER): Payer: Medicare Other

## 2020-04-05 VITALS — BP 123/81 | HR 67 | Ht 59.5 in | Wt 133.8 lb

## 2020-04-05 DIAGNOSIS — Z0000 Encounter for general adult medical examination without abnormal findings: Secondary | ICD-10-CM | POA: Diagnosis not present

## 2020-04-05 NOTE — Patient Instructions (Signed)
Marie Parker , Thank you for taking time to come for your Medicare Wellness Visit. I appreciate your ongoing commitment to your health goals. Please review the following plan we discussed and let me know if I can assist you in the future.   Screening recommendations/referrals: Colonoscopy: completed 02/09/2020 Mammogram: scheduled for this month Bone Density: completed 06/11/2018 Recommended yearly ophthalmology/optometry visit for glaucoma screening and checkup Recommended yearly dental visit for hygiene and checkup  Vaccinations: Influenza vaccine: due Pneumococcal vaccine: completed 12/11/2014 Tdap vaccine: completed 04/30/2017 Shingles vaccine: discussed   Covid-19:11/14/2019, 10/24/2019  Advanced directives: Please bring a copy of your POA (Power of Attorney) and/or Living Will to your next appointment.   Conditions/risks identified: none  Next appointment: Follow up in one year for your annual wellness visit    Preventive Care 65 Years and Older, Female Preventive care refers to lifestyle choices and visits with your health care provider that can promote health and wellness. What does preventive care include?  A yearly physical exam. This is also called an annual well check.  Dental exams once or twice a year.  Routine eye exams. Ask your health care provider how often you should have your eyes checked.  Personal lifestyle choices, including:  Daily care of your teeth and gums.  Regular physical activity.  Eating a healthy diet.  Avoiding tobacco and drug use.  Limiting alcohol use.  Practicing safe sex.  Taking low-dose aspirin every day.  Taking vitamin and mineral supplements as recommended by your health care provider. What happens during an annual well check? The services and screenings done by your health care provider during your annual well check will depend on your age, overall health, lifestyle risk factors, and family history of disease. Counseling    Your health care provider may ask you questions about your:  Alcohol use.  Tobacco use.  Drug use.  Emotional well-being.  Home and relationship well-being.  Sexual activity.  Eating habits.  History of falls.  Memory and ability to understand (cognition).  Work and work Statistician.  Reproductive health. Screening  You may have the following tests or measurements:  Height, weight, and BMI.  Blood pressure.  Lipid and cholesterol levels. These may be checked every 5 years, or more frequently if you are over 12 years old.  Skin check.  Lung cancer screening. You may have this screening every year starting at age 42 if you have a 30-pack-year history of smoking and currently smoke or have quit within the past 15 years.  Fecal occult blood test (FOBT) of the stool. You may have this test every year starting at age 61.  Flexible sigmoidoscopy or colonoscopy. You may have a sigmoidoscopy every 5 years or a colonoscopy every 10 years starting at age 63.  Hepatitis C blood test.  Hepatitis B blood test.  Sexually transmitted disease (STD) testing.  Diabetes screening. This is done by checking your blood sugar (glucose) after you have not eaten for a while (fasting). You may have this done every 1-3 years.  Bone density scan. This is done to screen for osteoporosis. You may have this done starting at age 91.  Mammogram. This may be done every 1-2 years. Talk to your health care provider about how often you should have regular mammograms. Talk with your health care provider about your test results, treatment options, and if necessary, the need for more tests. Vaccines  Your health care provider may recommend certain vaccines, such as:  Influenza vaccine. This is recommended  every year.  Tetanus, diphtheria, and acellular pertussis (Tdap, Td) vaccine. You may need a Td booster every 10 years.  Zoster vaccine. You may need this after age 33.  Pneumococcal 13-valent  conjugate (PCV13) vaccine. One dose is recommended after age 73.  Pneumococcal polysaccharide (PPSV23) vaccine. One dose is recommended after age 70. Talk to your health care provider about which screenings and vaccines you need and how often you need them. This information is not intended to replace advice given to you by your health care provider. Make sure you discuss any questions you have with your health care provider. Document Released: 09/10/2015 Document Revised: 05/03/2016 Document Reviewed: 06/15/2015 Elsevier Interactive Patient Education  2017 [place] Prevention in the Home Falls can cause injuries. They can happen to people of all ages. There are many things you can do to make your home safe and to help prevent falls. What can I do on the outside of my home?  Regularly fix the edges of walkways and driveways and fix any cracks.  Remove anything that might make you trip as you walk through a door, such as a raised step or threshold.  Trim any bushes or trees on the path to your home.  Use bright outdoor lighting.  Clear any walking paths of anything that might make someone trip, such as rocks or tools.  Regularly check to see if handrails are loose or broken. Make sure that both sides of any steps have handrails.  Any raised decks and porches should have guardrails on the edges.  Have any leaves, snow, or ice cleared regularly.  Use sand or salt on walking paths during winter.  Clean up any spills in your garage right away. This includes oil or grease spills. What can I do in the bathroom?  Use night lights.  Install grab bars by the toilet and in the tub and shower. Do not use towel bars as grab bars.  Use non-skid mats or decals in the tub or shower.  If you need to sit down in the shower, use a plastic, non-slip stool.  Keep the floor dry. Clean up any water that spills on the floor as soon as it happens.  Remove soap buildup in the tub or  shower regularly.  Attach bath mats securely with double-sided non-slip rug tape.  Do not have throw rugs and other things on the floor that can make you trip. What can I do in the bedroom?  Use night lights.  Make sure that you have a light by your bed that is easy to reach.  Do not use any sheets or blankets that are too big for your bed. They should not hang down onto the floor.  Have a firm chair that has side arms. You can use this for support while you get dressed.  Do not have throw rugs and other things on the floor that can make you trip. What can I do in the kitchen?  Clean up any spills right away.  Avoid walking on wet floors.  Keep items that you use a lot in easy-to-reach places.  If you need to reach something above you, use a strong step stool that has a grab bar.  Keep electrical cords out of the way.  Do not use floor polish or wax that makes floors slippery. If you must use wax, use non-skid floor wax.  Do not have throw rugs and other things on the floor that can make you trip. What  can I do with my stairs?  Do not leave any items on the stairs.  Make sure that there are handrails on both sides of the stairs and use them. Fix handrails that are broken or loose. Make sure that handrails are as long as the stairways.  Check any carpeting to make sure that it is firmly attached to the stairs. Fix any carpet that is loose or worn.  Avoid having throw rugs at the top or bottom of the stairs. If you do have throw rugs, attach them to the floor with carpet tape.  Make sure that you have a light switch at the top of the stairs and the bottom of the stairs. If you do not have them, ask someone to add them for you. What else can I do to help prevent falls?  Wear shoes that:  Do not have high heels.  Have rubber bottoms.  Are comfortable and fit you well.  Are closed at the toe. Do not wear sandals.  If you use a stepladder:  Make sure that it is fully  opened. Do not climb a closed stepladder.  Make sure that both sides of the stepladder are locked into place.  Ask someone to hold it for you, if possible.  Clearly mark and make sure that you can see:  Any grab bars or handrails.  First and last steps.  Where the edge of each step is.  Use tools that help you move around (mobility aids) if they are needed. These include:  Canes.  Walkers.  Scooters.  Crutches.  Turn on the lights when you go into a dark area. Replace any light bulbs as soon as they burn out.  Set up your furniture so you have a clear path. Avoid moving your furniture around.  If any of your floors are uneven, fix them.  If there are any pets around you, be aware of where they are.  Review your medicines with your doctor. Some medicines can make you feel dizzy. This can increase your chance of falling. Ask your doctor what other things that you can do to help prevent falls. This information is not intended to replace advice given to you by your health care provider. Make sure you discuss any questions you have with your health care provider. Document Released: 06/10/2009 Document Revised: 01/20/2016 Document Reviewed: 09/18/2014 Elsevier Interactive Patient Education  2017 [employer].

## 2020-04-05 NOTE — Progress Notes (Addendum)
I connected with Marie Parker today by telephone and verified that I am speaking with the correct person using two identifiers. Location patient: home Location provider: work Persons participating in the virtual visit: Jaylanie Boschee, Glenna Durand LPN.   I discussed the limitations, risks, security and privacy concerns of performing an evaluation and management service by telephone and the availability of in person appointments. I also discussed with the patient that there may be a patient responsible charge related to this service. The patient expressed understanding and verbally consented to this telephonic visit.    Interactive audio and video telecommunications were attempted between this provider and patient, however failed, due to patient having technical difficulties OR patient did not have access to video capability.  We continued and completed visit with audio only.    Vital signs may be patient reported or missing.   Subjective:   Marie Parker is a 74 y.o. female who presents for Medicare Annual (Subsequent) preventive examination.  Review of Systems     Cardiac Risk Factors include: advanced age (>59mn, >>73women);dyslipidemia;hypertension;sedentary lifestyle     Objective:    Today's Vitals   04/05/20 1111  BP: 123/81  Pulse: 67  SpO2: 97%  Weight: 133 lb 12.8 oz (60.7 kg)  Height: 4' 11.5" (1.511 m)   Body mass index is 26.57 kg/m.  Advanced Directives 04/05/2020 02/09/2020 01/30/2019 01/01/2019 06/26/2018 05/08/2018 04/22/2018  Does Patient Have a Medical Advance Directive? Yes Yes No Yes No No Yes  Type of Advance Directive Living will Living will - Living will - - Living will  Does patient want to make changes to medical advance directive? - - - - - No - Patient declined No - Patient declined  Copy of HPerryin Chart? - - - - - - -  Would patient like information on creating a medical advance directive? - - No - Patient declined - No -  Patient declined - -    Current Medications (verified) Outpatient Encounter Medications as of 04/05/2020  Medication Sig  . acetaminophen (TYLENOL) 650 MG CR tablet Take 1,950 mg by mouth daily as needed for pain.  .Marland Kitchenamitriptyline (ELAVIL) 25 MG tablet Take 1 tablet (25 mg total) by mouth at bedtime.  .Marland KitchenamLODipine (NORVASC) 10 MG tablet TAKE 1 TABLET BY MOUTH  DAILY  . aspirin 81 MG tablet Take 1 tablet (81 mg total) by mouth daily. (Patient taking differently: Take 81 mg by mouth at bedtime. )  . atenolol (TENORMIN) 50 MG tablet TAKE 1 TABLET BY MOUTH  DAILY  . atorvastatin (LIPITOR) 40 MG tablet TAKE 1 TABLET BY MOUTH  DAILY  . benazepril (LOTENSIN) 40 MG tablet TAKE 1 TABLET BY MOUTH  DAILY  . CALCIUM PO Take by mouth.  . clobetasol ointment (TEMOVATE) 0.05 % Daily at night for 6-12 weeks, if improvement, decrease to 2-3x per week  . clonazePAM (KLONOPIN) 0.5 MG tablet Take 0.5-1 tablets (0.25-0.5 mg total) by mouth 2 (two) times daily as needed for anxiety.  . fluticasone (FLONASE) 50 MCG/ACT nasal spray Place 2 sprays into both nostrils daily.  . hydrALAZINE (APRESOLINE) 25 MG tablet TAKE 1 TABLET BY MOUTH  TWICE DAILY  . letrozole (FEMARA) 2.5 MG tablet Take 1 tablet (2.5 mg total) by mouth daily.  . Loperamide HCl (IMODIUM PO) Take by mouth.   . niacin 500 MG tablet Take 1,000 mg by mouth at bedtime.  .Marland Kitchenomeprazole (PRILOSEC) 40 MG capsule Take 1 capsule (40 mg total) by  mouth 2 (two) times daily before a meal.  . Probiotic Product (PROBIOTIC-10 PO) Take by mouth daily.  . sertraline (ZOLOFT) 100 MG tablet Take 1.5 tablets (150 mg total) by mouth daily.  [name] tiZANidine (ZANAFLEX) 4 MG tablet TAKE 1 TABLET(4 MG) BY MOUTH EVERY 6 HOURS AS NEEDED FOR MUSCLE SPASMS  . traMADol (ULTRAM) 50 MG tablet Take 50 mg by mouth every 6 (six) hours.  . Turmeric 500 MG TABS Take 500 mg by mouth daily.  [name] nystatin (MYCOSTATIN) 100000 UNIT/ML suspension Take 5 mLs (500,000 Units total) by mouth 4 (four)  times daily. (Patient not taking: Reported on 04/05/2020)   No facility-administered encounter medications on file as of 04/05/2020.    Allergies (verified) Amoxicillin-pot clavulanate, Crestor [rosuvastatin], Cyclobenzaprine, Erythromycin, and Fosamax [alendronate sodium]   History: Past Medical History:  Diagnosis Date  . Anxiety   . Arthritis   . Breast cancer of upper-outer quadrant of left female breast ([place]) 04/22/2018   16 mm, T1c, N0.  ER/PR positive, HER-2/neu not overexpressed.  Low MammaPrint score.    [name] CAD (coronary artery disease)   . Cancer (HCC)    squamous cell  . Cataract   . Cholecystitis 04/10/2018  . Chronic kidney disease   . Colon cancer screening 2017?   negative cologuard  . Depression   . Family history of adverse reaction to anesthesia    mother had N/V  . Generalized osteoarthritis   . GERD (gastroesophageal reflux disease)   . Gout   . History of endoscopy 11/2012   Gastritis  . Hyperlipidemia   . Hypertension   . IFG (impaired fasting glucose)   . Insomnia   . Menopause   . Morton's neuroma of right foot 11/28/2019  . Osteoporosis   . Parathyroid adenoma   . Personal history of radiation therapy 2020   Left breast  . Personal history of tobacco use, presenting hazards to health 02/02/2016  . PVD (peripheral vascular disease) ([place])   . PVD (peripheral vascular disease) ([place])   . Rotator cuff syndrome of shoulder and allied disorders   . Tobacco abuse    Past Surgical History:  Procedure Laterality Date  . BREAST BIOPSY Left 2014   COMPLEX FIBROADENOMA  . BREAST BIOPSY Left 04/05/2018   [facility]  . BREAST EXCISIONAL BIOPSY Left 1970's   neg  . BREAST FIBROADENOMA SURGERY    . BREAST MAMMOSITE Left 04/2018  . CARDIAC CATHETERIZATION  09/17/2007   Insignificant CAD with 40-50% stenosis of mid LAD.   [name] COLONOSCOPY WITH PROPOFOL N/A 02/09/2020   Procedure: COLONOSCOPY WITH PROPOFOL;  Surgeon: Lin Landsman, MD;  Location: [facility] ENDOSCOPY;   Service: Gastroenterology;  Laterality: N/A;  . ESOPHAGOGASTRODUODENOSCOPY (EGD) WITH PROPOFOL N/A 02/09/2020   Procedure: ESOPHAGOGASTRODUODENOSCOPY (EGD) WITH PROPOFOL;  Surgeon: Lin Landsman, MD;  Location: [facility] ENDOSCOPY;  Service: Gastroenterology;  Laterality: N/A;  . ILIAC ARTERY STENT     right iliac artery stent   . MASTECTOMY, PARTIAL Left 04/22/2018   Procedure: MASTECTOMY PARTIAL;  Surgeon: Robert Bellow, MD;  Location: [facility] ORS;  Service: General;  Laterality: Left;  . ORIF PATELLA Left 03/22/2016   Dr. Tristan Schroeder  . PARATHYROIDECTOMY  2012   [facility]  . SENTINEL NODE BIOPSY Left 04/22/2018   Procedure: SENTINEL NODE BIOPSY;  Surgeon: Robert Bellow, MD;  Location: [facility] ORS;  Service: General;  Laterality: Left;  . SKIN BIOPSY     precancerous   Family History  Problem Relation Age of Onset  .  Heart attack Father 79  . Alcohol abuse Father   . Heart disease Father   . Liver disease Father   . Hypertension Father   . Heart disease Sister 5       CAD; s/p stent placement  . Stroke Sister   . AAA (abdominal aortic aneurysm) Sister   . Dementia Sister   . Stroke Mother   . Hypertension Mother   . Diabetes Mother   . Stroke Maternal Grandmother   . Heart attack Maternal Grandfather   . Stroke Paternal Grandmother   . Breast cancer Maternal Aunt 65  . Breast cancer Paternal Aunt   . Bone cancer Paternal Uncle    Social History   Socioeconomic History  . Marital status: Married    Spouse name: Not on file  . Number of children: Not on file  . Years of education: Not on file  . Highest education level: High school graduate  Occupational History  . Occupation: part time [employer]   Tobacco Use  . Smoking status: Former Smoker    Packs/day: 1.00    Years: 42.00    Pack years: 42.00    Types: Cigarettes    Quit date: 08/28/2000    Years since quitting: 19.6  . Smokeless tobacco: Never Used  Vaping Use  . Vaping Use: Never used  Substance and  Sexual Activity  . Alcohol use: No  . Drug use: No  . Sexual activity: Not Currently  Other Topics Concern  . 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 Difficulty of Paying Living Expenses: Not hard at all  Food Insecurity: No Food Insecurity  . Worried About Charity fundraiser in the Last Year: Never true  . Ran Out of Food in the Last Year: Never true  Transportation Needs: No Transportation Needs  . Lack of Transportation (Medical): No  . Lack of Transportation (Non-Medical): No  Physical Activity: Inactive  . Days of Exercise per Week: 0 days  . Minutes of Exercise per Session: 0 min  Stress: Stress Concern Present  . Feeling of Stress : To some extent  Social Connections: Socially Integrated  . Frequency of Communication with Friends and Family: More than three times a week  . Frequency of Social Gatherings with Friends and Family: More than three times a week  . Attends Religious Services: More than 4 times per year  . Active Member of Clubs or Organizations: Yes  . Attends Archivist Meetings: More than 4 times per year  . Marital Status: Married    Tobacco Counseling Counseling given: Not Answered   Clinical Intake:  Pre-visit preparation completed: Yes  Pain : No/denies pain     Nutritional Status: BMI 25 -29 Overweight Nutritional Risks: None Diabetes: No  How often do you need to have someone help you when you read instructions, pamphlets, or other written materials from your doctor or pharmacy?: 1 - Never What is the last grade level you completed in school?: 12th grade  Diabetic? no  Interpreter Needed?: No  Information entered by :: NAllen LPN   Activities of Daily Living In your present state of health, do you have any difficulty performing the following activities: 04/05/2020 04/24/2019  Hearing? N N    Vision? Y N  Comment cataracts causes some blurriness -  Difficulty  concentrating or making decisions? N N  Walking or climbing stairs? N N  Dressing or bathing? N N  Doing errands, shopping? N N  Preparing Food and eating ? N -  Using the Toilet? N -  In the past six months, have you accidently leaked urine? Y -  Comment with coughing and sneezing -  Do you have problems with loss of bowel control? N -  Managing your Medications? N -  Managing your Finances? N -  Housekeeping or managing your Housekeeping? N -  Some recent data might be hidden    Patient Care Team: Valerie Roys, DO as PCP - General (Family Medicine) Wellington Hampshire, MD as PCP - Cardiology (Cardiology) Jannet Mantis, MD (Dermatology) Dagoberto Ligas, MD as Referring Physician (Internal Medicine) Bary Castilla Forest Gleason, MD (General Surgery) Noreene Filbert, MD as Referring Physician (Radiation Oncology) Greg Cutter, LCSW as Social Worker (Licensed Clinical Social Worker) Vanita Ingles, RN as Case Manager (General Practice)  Indicate any recent Medical Services you may have received from other than [facility] providers in the past year (date may be approximate).     Assessment:   This is a routine wellness examination for Marie Parker.  Hearing/Vision screen  Hearing Screening   _0  _1  _2  _3  _4  _5  _6  _7  _8   Right ear:           Left ear:           Vision Screening Comments: Regular eye exams, Dr. Atilano Median  Dietary issues and exercise activities discussed: Current Exercise Habits: The patient does not participate in regular exercise at present  Goals    .  DIET - INCREASE WATER INTAKE      Recommend drinking at least 6-8 glasses of water a day     .  Patient Stated      04/05/2020, no goals    .  RNCM- I am working towards decreasing my anxiety (pt-stated)      Current Barriers:  [name] Knowledge Deficits related to caregiver strain and reducing daily anxiety . Lacks  caregiver support.   Nurse Case Manager Clinical Goal(s):  [name] Over the next 120 days, patient will work with [facility] to address needs related to reducing caregiver strian  Interventions:  . Evaluation of current treatment plan related to anxiety/stress and patient's adherence to plan as established by provider. The patient has had an increase in her zoloft and her GI doctor started her on Amitriptyline.  The Amitriptyline had been working well. She had to take a half of a Klonopin today because she had gotten so worked up.  She stated that the GI doctor did not want her to use the Klonopin but she does not use it unless she absolutely has to do so.  The patient states she thinks a lot of it has to do with thinking in her mind that she has cancer and the colonoscopy it going to reveal this. Empathetic listening. 01/29/2020:  The patient called today to see if Dr. Wynetta Emery would do a refill for the Amitriptyline. [facility] called the GI provider but the GI provider would not refill the Amitriptyline. The patient ask if Dr. Wynetta Emery would be willing to do a refill. This is helping the patient rest better. The patient wanted the [facility] to know that she is feeling better. She and her husband are currently at the beach and she is happy to be a way for a few days. Will send an inbasket message to  Dr. Wynetta Emery and the pharm D asking for recommendations and guidance.  . Advised patient to think about incorporating exercise into her daily routine, a healthy diet, and taking breaks and time for herself. 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 Discussed mindfulness exercises, writing in a journal, crafts, listening to music, taking time for self, reflection. The patient has a card game on her phone she plays and likes to color with an app she has on her phone.  . Reviewed medications with patient and discussed increase in zoloft has been very helpful.  The  patient saw pcp recently for anxiety and her zoloft has been increased.  . Discussed plans with patient for ongoing care management follow up and provided patient with direct contact information for care management team . Reviewed with patient how she is handling stress/anxiety. Patient stating she has decreased some of her outside activities to assist her spouse more which has relieved some of her time commitments. The patient states that her husband is doing better right now. She is not having do do daily infusions like she was and that has helped a lot. The patient is having to do a daily medication for him but she states it is nothing like before. She said, "I think everything is just catching up with me".  Education and support given. The patient talked about her strong faith and also that her and her husband are going to [place] next week for a few days. She is looking forward to the trip. 01-29-2020: the patient and her husband are currently at the beach. She was sitting out on the deck enjoying the ocean breeze. The patient says she feels much better today.   [name] LCSW is working with the patient at this time. Will touch base with pharmacy to see if they have any other recommendations for management of anxiety and stress.   Patient Self Care Activities:  . Currently UNABLE TO independently manage stress and anxiety, self reported . Performs ADL's independently . Performs IADL's independently  Please see past updates related to this goal by clicking on the "Past Updates" button in the selected goal      .  RNCM: Pt- "I have not been taking my blood pressure" (pt-stated)      CARE PLAN ENTRY (see longtitudinal plan of care for additional care plan information)  Current Barriers:  . Chronic Disease Management support, education, and care coordination needs related to CAD, HTN, HLD, and chronic diarrhea  Clinical Goal(s) related to CAD, HTN, HLD, and Chronic Diarrhea :  Over the next 120  days, patient will:  . Work with the care management team to address educational, disease management, and care coordination needs  . Begin or continue self health monitoring activities as directed today Measure and record blood pressure 2 times per week and adhere to a heart healthy diet . Call provider office for new or worsened signs and symptoms Blood pressure findings outside established parameters and New or worsened symptom related to CAD/HLD/Diarrhea and other chronic conditions . Call care management team with questions or concerns . Verbalize basic understanding of patient centered plan of care established today  Interventions related to CAD, HTN, HLD, and Chronic Diarrhea :  . Evaluation of current treatment plans and patient's adherence to plan as established by provider.  The patient is having a colonoscopy on 02-09-2020 to evaluate the chronic diarrhea she has  been dealing with. She has worked herself up into thinking she has cancer. Education, support and empathetic listening.  . Assessed patient understanding of disease states.  The patient verbalized understanding of chronic conditions. The patient encouraged to step back and do some self care to help with her health and well being.  . Assessed patient's education and care coordination needs.  Education and support on ways to decrease stress in her life and take care of self while she is also the primary caregiver of her husband.  . Provided disease specific education to patient. Education on the benefits of taking blood pressures a couple of times a week. Blood pressure readings have been elevated recently. The patient states she takes her husbands every day and she hasn't taken hers in a while. Education on the risk of elevated blood pressure of heart attack and stroke. Encouraged the patient to really focus on self care and finding balance in life. 01-29-2020: The patient states she feels this trip to the beach is going to be very  beneficial for her and her husband. She feels relaxed and much better than she did last week when talking to the [facility].  Will continue to monitor.  Nash Dimmer with appropriate clinical care team members regarding patient needs . Review of upcoming appointments. The patient does not see the pcp again until 04-08-2020.  Will have colonoscopy on 02-09-2020.   Patient Self Care Activities related to CAD, HTN, HLD, and Chronic Diarrhea :  . Patient is unable to independently self-manage chronic health conditions  Initial goal documentation     .  SW-I want to improve my self-care and decrease my anxiety (pt-stated)      Current Barriers:  . Financial constraints . Limited social support . Level of care concerns . Mental Health Concerns  . Social Isolation . Limited access to caregiver- in terms of spouse . Lacks knowledge of community resource: available personal care service resources and [facility] resource connection within the area  Clinical Social Work Clinical Goal(s):  [name] Over the next 90 days, client will work with SW to address concerns related to reducing anxiety and improving self-care  Interventions: . Patient interviewed and appropriate assessments performed . Provided mental health counseling with regard to managing anxiety. Educated patient on coping methods to implement into his daily life to combat depressive symptoms and stress. Patient denied any suicidal or homicidal ideations. Encouraged patient to implement deep breathing exercises into her daily routine due to ongoing anxiety. Patient reports having therapy in the past but denies wanting LCSW to make a referral for counseling. Patient was appreciative of the offer but wishes to manage her care without mental health therapy at this time. . Patient reports that since her Zoloft medication was increased by 50 mg, she is able to manage her depressive symptoms a lot more effectively.  . Provided patient with information about available  personal care service resources within the area. . CSW used motivational interviewing techniques to engage pt in meaningful conversation and to assess pt's wilingess to improve self-care and decrease her caregiver burnout symptoms. CSW explored pt's coping skills and access to community services.  [name]  LCSW provided reflective listening and implemented appropriate interventions to help suppport patient and her emotional needs  . Patient was receptive to emotional support provided during session . Discussed plans with patient for ongoing care management follow up and provided patient with direct contact information for care management team . Advised patient to contact CCM program for  any urgent needs. . Assisted patient/caregiver with obtaining information about health plan benefits . Provided education to patient/caregiver regarding level of care options. . Patient reports that her ear ache medication was giving her adverse side effects and she discontinued medication but is still having symptoms. Patient reports that she has had diarrhea for two weeks now and thought it was getting better but it has returned. Patient reports that she was up all night with symptoms. CCM RNCM notified. Patient states that she will contact PCP office again if symptoms continue or get worse. . Patient reports that overall she is doing better and is gaining much needed socialization to combat her anxiety. Patient reports that she is currently out with her aunt and that they plan to go out to eat and get their hair done together. Positive reinforcement provided for self-care and socialization.   Patient Self Care Activities:  . Attends all scheduled provider appointments . Calls provider office for new concerns or questions  Please see past updates related to this goal by clicking on the "Past Updates" button in the selected goal        Depression Screen PHQ 2/9 Scores 04/05/2020 02/20/2020 01/23/2020 01/14/2020 09/17/2019  07/31/2019 04/24/2019  PHQ - 2 Score 0 _0 0 1 0  PHQ- 9 Score 0 _1 - 1 1  Exception Documentation - - - - - - -    Fall Risk Fall Risk  04/05/2020 02/20/2020 04/24/2019 04/15/2019 01/01/2019  Falls in the past year? 0 0 0 0 0  Number falls in past yr: - 0 0 0 -  Injury with Fall? - - 0 0 -  Risk for fall due to : Medication side effect - - - -  Follow up Falls evaluation completed;Education provided;Falls prevention discussed - - - -    Any stairs in or around the home? Yes  If so, are there any without handrails? Yes  Home free of loose throw rugs in walkways, pet beds, electrical cords, etc? Yes  Adequate lighting in your home to reduce risk of falls? Yes   ASSISTIVE DEVICES UTILIZED TO PREVENT FALLS:  Life alert? No  Use of a cane, walker or w/c? No  Grab bars in the bathroom? Yes  Shower chair or bench in shower? Yes  Elevated toilet seat or a handicapped toilet? No   TIMED UP AND GO:  Was the test performed? No .   Cognitive Function:     6CIT Screen 04/05/2020 01/01/2019 12/24/2017 12/21/2016  What Year? 0 points 0 points 0 points 0 points  What month? 0 points 0 points 0 points 0 points  What time? 0 points 0 points 0 points 0 points  Count back from 20 0 points 0 points 0 points 0 points  Months in reverse 0 points 0 points 0 points 2 points  Repeat phrase 4 points 0 points 0 points 4 points  Total Score 4 0 0 6    Immunizations Immunization History  Administered Date(s) Administered  . Fluad Quad(high Dose 65+) 04/24/2019  . Influenza, High Dose Seasonal PF 06/21/2016, 05/24/2018  . Influenza,inj,Quad PF,6+ Mos 06/22/2015  . Influenza-Unspecified 06/02/2014, 05/07/2017  . PFIZER SARS-COV-2 Vaccination 10/24/2019, 11/14/2019  . Pneumococcal Conjugate-13 09/01/2014  . Pneumococcal Polysaccharide-23 12/11/2014  . Td 04/10/2005, 12/21/2015  . Tdap 04/30/2017  . Zoster 08/11/2008    TDAP status: Up to date Flu Vaccine status: Up to date Pneumococcal vaccine  status: Up to date Covid-19 vaccine status: Completed vaccines  Qualifies for Shingles Vaccine? Yes   Zostavax completed yes  Shingrix Completed?: No.    Education has been provided regarding the importance of this vaccine. Patient has been advised to call insurance company to determine out of pocket expense if they have not yet received this vaccine. Advised may also receive vaccine at local pharmacy or Health Dept. Verbalized acceptance and understanding.  Screening Tests Health Maintenance  Topic Date Due  . Fecal DNA (Cologuard)  02/02/2019  . INFLUENZA VACCINE  03/28/2020  . MAMMOGRAM  04/09/2021  . TETANUS/TDAP  05/01/2027  . DEXA SCAN  Completed  . COVID-19 Vaccine  Completed  . PNA vac Low Risk Adult  Completed  . Hepatitis C Screening  Addressed    Health Maintenance  Health Maintenance Due  Topic Date Due  . Fecal DNA (Cologuard)  02/02/2019  . INFLUENZA VACCINE  03/28/2020    Colorectal cancer screening: Completed 02/09/2020. Repeat every 5 years Mammogram status: Completed 04/10/2019. Repeat every year Bone Density status: Completed 06/11/2018.   Lung Cancer Screening: (Low Dose CT Chest recommended if Age 34-80 years, 30 pack-year currently smoking OR have quit w/in 15years.) does not qualify.   Lung Cancer Screening Referral: no   Additional Screening:  Hepatitis C Screening: does qualify; Completed 12/11/2014  Vision Screening: Recommended annual ophthalmology exams for early detection of glaucoma and other disorders of the eye. Is the patient up to date with their annual eye exam?  Yes  Who is the provider or what is the name of the office in which the patient attends annual eye exams? Dr. Atilano Median If pt is not established with a provider, would they like to be referred to a provider to establish care? No .   Dental Screening: Recommended annual dental exams for proper oral hygiene  Community Resource Referral / Chronic Care Management: CRR required this  visit?  No   CCM required this visit?  No      Plan:     I have personally reviewed and noted the following in the patient's chart:   . Medical and social history . Use of alcohol, tobacco or illicit drugs  . Current medications and supplements . Functional ability and status . Nutritional status . Physical activity . Advanced directives . List of other physicians . Hospitalizations, surgeries, and ER visits in previous 12 months . Vitals . Screenings to include cognitive, depression, and falls . Referrals and appointments  In addition, I have reviewed and discussed with patient certain preventive protocols, quality metrics, and best practice recommendations. A written personalized care plan for preventive services as well as general preventive health recommendations were provided to patient.   Due to this being a telephonic visit, the after visit summary with patients personalized plan was offered to patient via mail or my-chart. Patient would like to access on my-chart  Kellie Simmering, LPN   03/29/9561   Nurse Notes:

## 2020-04-07 ENCOUNTER — Telehealth: Payer: Self-pay | Admitting: General Practice

## 2020-04-07 ENCOUNTER — Ambulatory Visit (INDEPENDENT_AMBULATORY_CARE_PROVIDER_SITE_OTHER): Payer: Medicare Other | Admitting: General Practice

## 2020-04-07 DIAGNOSIS — I2510 Atherosclerotic heart disease of native coronary artery without angina pectoris: Secondary | ICD-10-CM | POA: Diagnosis not present

## 2020-04-07 DIAGNOSIS — I129 Hypertensive chronic kidney disease with stage 1 through stage 4 chronic kidney disease, or unspecified chronic kidney disease: Secondary | ICD-10-CM | POA: Diagnosis not present

## 2020-04-07 DIAGNOSIS — F419 Anxiety disorder, unspecified: Secondary | ICD-10-CM

## 2020-04-07 DIAGNOSIS — E782 Mixed hyperlipidemia: Secondary | ICD-10-CM | POA: Diagnosis not present

## 2020-04-07 DIAGNOSIS — Z636 Dependent relative needing care at home: Secondary | ICD-10-CM

## 2020-04-07 DIAGNOSIS — R197 Diarrhea, unspecified: Secondary | ICD-10-CM

## 2020-04-07 DIAGNOSIS — F411 Generalized anxiety disorder: Secondary | ICD-10-CM

## 2020-04-07 NOTE — Chronic Care Management (AMB) (Signed)
Chronic Care Management   Follow Up Note   04/07/2020 Name: Marie Parker MRN: [id number] DOB: 03-Mar-1946  Referred by: Valerie Roys, DO Reason for referral : Chronic Care Management (RNCM Follow up Call for Chronic Disease Management and Care Coordination Needs)   Marie Parker is a 74 y.o. year old female who is a primary care patient of Valerie Roys, DO. The CCM team was consulted for assistance with chronic disease management and care coordination needs.    Review of patient status, including review of consultants reports, relevant laboratory and other test results, and collaboration with appropriate care team members and the patient's provider was performed as part of comprehensive patient evaluation and provision of chronic care management services.    SDOH (Social Determinants of Health) assessments performed: Yes See Care Plan activities for detailed interventions related to [facility])     Outpatient Encounter Medications as of 04/07/2020  Medication Sig  . acetaminophen (TYLENOL) 650 MG CR tablet Take 1,950 mg by mouth daily as needed for pain.  [name] amitriptyline (ELAVIL) 25 MG tablet Take 1 tablet (25 mg total) by mouth at bedtime.  [name] amLODipine (NORVASC) 10 MG tablet TAKE 1 TABLET BY MOUTH  DAILY  . aspirin 81 MG tablet Take 1 tablet (81 mg total) by mouth daily. (Patient taking differently: Take 81 mg by mouth at bedtime. )  . atenolol (TENORMIN) 50 MG tablet TAKE 1 TABLET BY MOUTH  DAILY  . atorvastatin (LIPITOR) 40 MG tablet TAKE 1 TABLET BY MOUTH  DAILY  . benazepril (LOTENSIN) 40 MG tablet TAKE 1 TABLET BY MOUTH  DAILY  . CALCIUM PO Take by mouth.  . clobetasol ointment (TEMOVATE) 0.05 % Daily at night for 6-12 weeks, if improvement, decrease to 2-3x per week  . clonazePAM (KLONOPIN) 0.5 MG tablet Take 0.5-1 tablets (0.25-0.5 mg total) by mouth 2 (two) times daily as needed for anxiety.  . fluticasone (FLONASE) 50 MCG/ACT nasal spray Place 2 sprays into both  nostrils daily.  . hydrALAZINE (APRESOLINE) 25 MG tablet TAKE 1 TABLET BY MOUTH  TWICE DAILY  . letrozole (FEMARA) 2.5 MG tablet Take 1 tablet (2.5 mg total) by mouth daily.  . Loperamide HCl (IMODIUM PO) Take by mouth.   . niacin 500 MG tablet Take 1,000 mg by mouth at bedtime.  [name] nystatin (MYCOSTATIN) 100000 UNIT/ML suspension Take 5 mLs (500,000 Units total) by mouth 4 (four) times daily. (Patient not taking: Reported on 04/05/2020)  . omeprazole (PRILOSEC) 40 MG capsule Take 1 capsule (40 mg total) by mouth 2 (two) times daily before a meal.  . Probiotic Product (PROBIOTIC-10 PO) Take by mouth daily.  . sertraline (ZOLOFT) 100 MG tablet Take 1.5 tablets (150 mg total) by mouth daily.  [name] tiZANidine (ZANAFLEX) 4 MG tablet TAKE 1 TABLET(4 MG) BY MOUTH EVERY 6 HOURS AS NEEDED FOR MUSCLE SPASMS  . traMADol (ULTRAM) 50 MG tablet Take 50 mg by mouth every 6 (six) hours.  . Turmeric 500 MG TABS Take 500 mg by mouth daily.   No facility-administered encounter medications on file as of 04/07/2020.     Objective:  BP Readings from Last 3 Encounters:  04/05/20 123/81  03/08/20 131/68  03/04/20 (!) 144/70    Goals Addressed              This Visit's Progress   .  RNCM- I am working towards decreasing my anxiety (pt-stated)        Current Barriers:  [name] Knowledge Deficits related to  caregiver strain and reducing daily anxiety . Lacks caregiver support.   Nurse Case Manager Clinical Goal(s):  [name] Over the next 120 days, patient will work with [facility] to address needs related to reducing caregiver strain.  04-07-2020: The patients husband at this time is stable and the patients anxiety is better.  Still working with the patient on her anxiety level and maintaining a healthy mental status.  Interventions:  . Evaluation of current treatment plan related to anxiety/stress and patient's adherence to plan as established by provider. The patient has had an increase in her zoloft and her GI doctor started  her on Amitriptyline.  The Amitriptyline had been working well. She had to take a half of a Klonopin today because she had gotten so worked up.  She stated that the GI doctor did not want her to use the Klonopin but she does not use it unless she absolutely has to do so.  The patient states she thinks a lot of it has to do with thinking in her mind that she has cancer and the colonoscopy it going to reveal this. Empathetic listening. 04-07-2020: The patient verbalized she did not know why it took her so long to get through her last bout with anxiety but states she is feeling much better now. She and her husband were on their way to [place] and will be there through Sunday and then she and her husband will travel to [place] to be with their daughter and family for a few days.  She is very happy about being able to get away to the beach and have some time of relaxation. The patient states also she feels better since her colonoscopy is behind her.  . Advised patient to think about incorporating exercise into her daily routine, a healthy diet, and taking breaks and time for herself. 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 Discussed mindfulness exercises, writing in a journal, crafts, listening to music, taking time for self, reflection. The patient has a card game on her phone she plays and likes to color with an app she has on her phone.  . Reviewed medications with patient and discussed increase in zoloft has been very helpful.  The patient saw pcp recently for anxiety and her zoloft has been increased.  . Discussed plans with patient for ongoing care management follow up and provided patient with direct contact information for care management team . Reviewed with patient how she is handling stress/anxiety. Patient stating she has decreased some of her outside activities to assist her spouse more which has relieved some of her time  commitments. The patient states that her husband is doing better right now. She is not having do do daily infusions like she was and that has helped a lot. The patient is having to do a daily medication for him but she states it is nothing like before.  Education and support given. The patient talked about her strong faith.  04-07-2020: The patient and her husband are currently on their way to the beach. This makes her happy that she can still go to the beach.  [name] LCSW is working with the patient at this time. Will touch base with pharmacy to see if they have any other recommendations for management of anxiety and stress.   Patient Self Care Activities:  . Currently UNABLE TO independently manage stress and anxiety, self reported . Performs  ADL's independently . Performs IADL's independently  Please see past updates related to this goal by clicking on the "Past Updates" button in the selected goal      .  RNCM: Pt- "I have not been taking my blood pressure" (pt-stated)        CARE PLAN ENTRY (see longtitudinal plan of care for additional care plan information)  Current Barriers:  . Chronic Disease Management support, education, and care coordination needs related to CAD, HTN, HLD, and chronic diarrhea  Clinical Goal(s) related to CAD, HTN, HLD, and Chronic Diarrhea :  Over the next 120 days, patient will:  . Work with the care management team to address educational, disease management, and care coordination needs  . Begin or continue self health monitoring activities as directed today Measure and record blood pressure 2 times per week and adhere to a heart healthy diet . Call provider office for new or worsened signs and symptoms Blood pressure findings outside established parameters and New or worsened symptom related to CAD/HLD/Diarrhea and other chronic conditions . Call care management team with questions or concerns . Verbalize basic understanding of patient centered plan of care  established today  Interventions related to CAD, HTN, HLD, and Chronic Diarrhea :  . Evaluation of current treatment plans and patient's adherence to plan as established by provider.  The patient is having a colonoscopy on 02-09-2020 to evaluate the chronic diarrhea she has been dealing with. She has worked herself up into thinking she has cancer. Education, support and empathetic listening. 04-07-2020: the patient states they found 3 polyps that were precancerous that were removed and they will do a repeat colonoscopy in 5 years. She is feeling so much better now that her colonoscopy is behind her.  . Assessed patient understanding of disease states.  The patient verbalized understanding of chronic conditions. The patient encouraged to step back and do some self care to help with her health and well being.  . Assessed patient's education and care coordination needs.  Education and support on ways to decrease stress in her life and take care of self while she is also the primary caregiver of her husband.  . Provided disease specific education to patient. Education on the benefits of taking blood pressures a couple of times a week. Blood pressure readings have been elevated recently.  Education on the risk of elevated blood pressure of heart attack and stroke. Encouraged the patient to really focus on self care and finding balance in life. 04-07-2020: The patient has been taking her blood pressure more frequently. The patient states she had been prescribed hydralazine TID but forgets to take it like that. She states when her face gets flushed or she feels different she takes her pressure. Most recent was 150/80.  When her pressures are in the 140 to 150 range she takes a hydralazine and the pressure comes down to 120's.  The patient states that she has been feeling much better since her anxiety is controlled. Discussed her uncontrolled anxiety may have been a factor in her blood pressures being up. Encouraged the  patient to talk to Dr. Wynetta Emery about blood pressure regimen and recommendations on her next scheduled visit on 04-22-2020.  The patient verbalized understanding.   . Evaluation of dietary habits: The patient states she is adhering to a heart healthy diet. She feels her health has stabilized out at this time.  Nash Dimmer with appropriate clinical care team members regarding patient needs . Review of upcoming appointments. The  patient does not see the pcp again until 04-08-2020.    Patient Self Care Activities related to CAD, HTN, HLD, and Chronic Diarrhea :  . Patient is unable to independently self-manage chronic health conditions  Please see past updates related to this goal by clicking on the "Past Updates" button in the selected goal          Plan:   Telephone follow up appointment with care management team member scheduled for: 06-09-2020 at 0930 am    [place], MSN, [facility] Mobile: [phone]

## 2020-04-07 NOTE — Patient Instructions (Signed)
Visit Information  Goals Addressed              This Visit's Progress   .  RNCM- I am working towards decreasing my anxiety (pt-stated)        Current Barriers:  [name] Knowledge Deficits related to caregiver strain and reducing daily anxiety . Lacks caregiver support.   Nurse Case Manager Clinical Goal(s):  [name] Over the next 120 days, patient will work with [facility] to address needs related to reducing caregiver strain.  04-07-2020: The patients husband at this time is stable and the patients anxiety is better.  Still working with the patient on her anxiety level and maintaining a healthy mental status.  Interventions:  . Evaluation of current treatment plan related to anxiety/stress and patient's adherence to plan as established by provider. The patient has had an increase in her zoloft and her GI doctor started her on Amitriptyline.  The Amitriptyline had been working well. She had to take a half of a Klonopin today because she had gotten so worked up.  She stated that the GI doctor did not want her to use the Klonopin but she does not use it unless she absolutely has to do so.  The patient states she thinks a lot of it has to do with thinking in her mind that she has cancer and the colonoscopy it going to reveal this. Empathetic listening. 04-07-2020: The patient verbalized she did not know why it took her so long to get through her last bout with anxiety but states she is feeling much better now. She and her husband were on their way to [place] and will be there through Sunday and then she and her husband will travel to [place] to be with their daughter and family for a few days.  She is very happy about being able to get away to the beach and have some time of relaxation. The patient states also she feels better since her colonoscopy is behind her.  . Advised patient to think about incorporating exercise into her daily routine, a healthy diet, and taking breaks and time for herself. 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 Discussed mindfulness exercises, writing in a journal, crafts, listening to music, taking time for self, reflection. The patient has a card game on her phone she plays and likes to color with an app she has on her phone.  . Reviewed medications with patient and discussed increase in zoloft has been very helpful.  The patient saw pcp recently for anxiety and her zoloft has been increased.  . Discussed plans with patient for ongoing care management follow up and provided patient with direct contact information for care management team . Reviewed with patient how she is handling stress/anxiety. Patient stating she has decreased some of her outside activities to assist her spouse more which has relieved some of her time commitments. The patient states that her husband is doing better right now. She is not having do do daily infusions like she was and that has helped a lot. The patient is having to do a daily medication for him but she states it is nothing like before.  Education and support given. The patient talked about her strong faith.  04-07-2020: The patient and her husband are currently on their way to the beach. This makes her happy that she can still go to the beach.  [name]  LCSW is working with the patient at this time. Will touch base with pharmacy to see if they have any other recommendations for management of anxiety and stress.   Patient Self Care Activities:  . Currently UNABLE TO independently manage stress and anxiety, self reported . Performs ADL's independently . Performs IADL's independently  Please see past updates related to this goal by clicking on the "Past Updates" button in the selected goal      .  RNCM: Pt- "I have not been taking my blood pressure" (pt-stated)        CARE PLAN ENTRY (see longtitudinal plan of care for additional care plan information)  Current Barriers:   . Chronic Disease Management support, education, and care coordination needs related to CAD, HTN, HLD, and chronic diarrhea  Clinical Goal(s) related to CAD, HTN, HLD, and Chronic Diarrhea :  Over the next 120 days, patient will:  . Work with the care management team to address educational, disease management, and care coordination needs  . Begin or continue self health monitoring activities as directed today Measure and record blood pressure 2 times per week and adhere to a heart healthy diet . Call provider office for new or worsened signs and symptoms Blood pressure findings outside established parameters and New or worsened symptom related to CAD/HLD/Diarrhea and other chronic conditions . Call care management team with questions or concerns . Verbalize basic understanding of patient centered plan of care established today  Interventions related to CAD, HTN, HLD, and Chronic Diarrhea :  . Evaluation of current treatment plans and patient's adherence to plan as established by provider.  The patient is having a colonoscopy on 02-09-2020 to evaluate the chronic diarrhea she has been dealing with. She has worked herself up into thinking she has cancer. Education, support and empathetic listening. 04-07-2020: the patient states they found 3 polyps that were precancerous that were removed and they will do a repeat colonoscopy in 5 years. She is feeling so much better now that her colonoscopy is behind her.  . Assessed patient understanding of disease states.  The patient verbalized understanding of chronic conditions. The patient encouraged to step back and do some self care to help with her health and well being.  . Assessed patient's education and care coordination needs.  Education and support on ways to decrease stress in her life and take care of self while she is also the primary caregiver of her husband.  . Provided disease specific education to patient. Education on the benefits of taking blood  pressures a couple of times a week. Blood pressure readings have been elevated recently.  Education on the risk of elevated blood pressure of heart attack and stroke. Encouraged the patient to really focus on self care and finding balance in life. 04-07-2020: The patient has been taking her blood pressure more frequently. The patient states she had been prescribed hydralazine TID but forgets to take it like that. She states when her face gets flushed or she feels different she takes her pressure. Most recent was 150/80.  When her pressures are in the 140 to 150 range she takes a hydralazine and the pressure comes down to 120's.  The patient states that she has been feeling much better since her anxiety is controlled. Discussed her uncontrolled anxiety may have been a factor in her blood pressures being up. Encouraged the patient to talk to Dr. Wynetta Emery about blood pressure regimen and recommendations on her next scheduled visit on 04-22-2020.  The patient  verbalized understanding.   . Evaluation of dietary habits: The patient states she is adhering to a heart healthy diet. She feels her health has stabilized out at this time.  Nash Dimmer with appropriate clinical care team members regarding patient needs . Review of upcoming appointments. The patient does not see the pcp again until 04-08-2020.    Patient Self Care Activities related to CAD, HTN, HLD, and Chronic Diarrhea :  . Patient is unable to independently self-manage chronic health conditions  Please see past updates related to this goal by clicking on the "Past Updates" button in the selected goal         Patient verbalizes understanding of instructions provided today.   Telephone follow up appointment with care management team member scheduled for: 06-09-2020 at [place], MSN, [facility] Mobile: [phone]

## 2020-04-08 ENCOUNTER — Encounter: Payer: Medicare Other | Admitting: Family Medicine

## 2020-04-16 ENCOUNTER — Ambulatory Visit
Admission: RE | Admit: 2020-04-16 | Discharge: 2020-04-16 | Disposition: A | Discharge status: Home / Self Care | Payer: Medicare Other | Source: Ambulatory Visit | Attending: General Surgery | Admitting: General Surgery

## 2020-04-16 ENCOUNTER — Other Ambulatory Visit: Payer: Self-pay

## 2020-04-16 DIAGNOSIS — R928 Other abnormal and inconclusive findings on diagnostic imaging of breast: Secondary | ICD-10-CM | POA: Diagnosis not present

## 2020-04-16 DIAGNOSIS — C50812 Malignant neoplasm of overlapping sites of left female breast: Secondary | ICD-10-CM

## 2020-04-16 DIAGNOSIS — Z853 Personal history of malignant neoplasm of breast: Secondary | ICD-10-CM | POA: Diagnosis not present

## 2020-04-16 HISTORY — DX: Malignant neoplasm of unspecified site of unspecified female breast: C50.919

## 2020-04-21 ENCOUNTER — Telehealth: Payer: Self-pay

## 2020-04-21 ENCOUNTER — Telehealth: Payer: Self-pay | Admitting: Licensed Clinical Social Worker

## 2020-04-21 NOTE — Telephone Encounter (Signed)
  Chronic Care Management    Clinical Social Work General Follow Up Note  04/21/2020 Name: Marie Parker MRN: [id number] DOB: 1945-09-20  Marie Parker is a 74 y.o. year old female who is a primary care patient of Valerie Roys, DO. The CCM team was consulted for assistance with Mental Health Counseling and Resources.   Review of patient status, including review of consultants reports, relevant laboratory and other test results, and collaboration with appropriate care team members and the patient's provider was performed as part of comprehensive patient evaluation and provision of chronic care management services.    LCSW completed CCM outreach attempt today but was unable to reach patient successfully. A HIPPA compliant voice message was left encouraging patient to return call once available. LCSW will route encounter to Scheduling Care Guide and have them reschedule CCM SW appointment as well.  Outpatient Encounter Medications as of 04/21/2020  Medication Sig  . acetaminophen (TYLENOL) 650 MG CR tablet Take 1,950 mg by mouth daily as needed for pain.  [name] amitriptyline (ELAVIL) 25 MG tablet Take 1 tablet (25 mg total) by mouth at bedtime.  [name] amLODipine (NORVASC) 10 MG tablet TAKE 1 TABLET BY MOUTH  DAILY  . aspirin 81 MG tablet Take 1 tablet (81 mg total) by mouth daily. (Patient taking differently: Take 81 mg by mouth at bedtime. )  . atenolol (TENORMIN) 50 MG tablet TAKE 1 TABLET BY MOUTH  DAILY  . atorvastatin (LIPITOR) 40 MG tablet TAKE 1 TABLET BY MOUTH  DAILY  . benazepril (LOTENSIN) 40 MG tablet TAKE 1 TABLET BY MOUTH  DAILY  . CALCIUM PO Take by mouth.  . clobetasol ointment (TEMOVATE) 0.05 % Daily at night for 6-12 weeks, if improvement, decrease to 2-3x per week  . clonazePAM (KLONOPIN) 0.5 MG tablet Take 0.5-1 tablets (0.25-0.5 mg total) by mouth 2 (two) times daily as needed for anxiety.  . fluticasone (FLONASE) 50 MCG/ACT nasal spray Place 2 sprays into both nostrils daily.    . hydrALAZINE (APRESOLINE) 25 MG tablet TAKE 1 TABLET BY MOUTH  TWICE DAILY  . letrozole (FEMARA) 2.5 MG tablet Take 1 tablet (2.5 mg total) by mouth daily.  . Loperamide HCl (IMODIUM PO) Take by mouth.   . niacin 500 MG tablet Take 1,000 mg by mouth at bedtime.  [name] nystatin (MYCOSTATIN) 100000 UNIT/ML suspension Take 5 mLs (500,000 Units total) by mouth 4 (four) times daily. (Patient not taking: Reported on 04/05/2020)  . omeprazole (PRILOSEC) 40 MG capsule Take 1 capsule (40 mg total) by mouth 2 (two) times daily before a meal.  . Probiotic Product (PROBIOTIC-10 PO) Take by mouth daily.  . sertraline (ZOLOFT) 100 MG tablet Take 1.5 tablets (150 mg total) by mouth daily.  [name] tiZANidine (ZANAFLEX) 4 MG tablet TAKE 1 TABLET(4 MG) BY MOUTH EVERY 6 HOURS AS NEEDED FOR MUSCLE SPASMS  . traMADol (ULTRAM) 50 MG tablet Take 50 mg by mouth every 6 (six) hours.  . Turmeric 500 MG TABS Take 500 mg by mouth daily.   No facility-administered encounter medications on file as of 04/21/2020.    Follow Up Plan: [place] will reach out to patient to reschedule appointment.    Eula Fried, BSW, MSW, [facility] [email or web address] Phone: [phone]

## 2020-04-22 ENCOUNTER — Encounter: Payer: Self-pay | Admitting: Family Medicine

## 2020-04-22 ENCOUNTER — Ambulatory Visit: Payer: Medicare Other | Admitting: Gastroenterology

## 2020-04-22 ENCOUNTER — Other Ambulatory Visit: Payer: Self-pay

## 2020-04-22 ENCOUNTER — Ambulatory Visit (INDEPENDENT_AMBULATORY_CARE_PROVIDER_SITE_OTHER): Payer: Medicare Other | Admitting: Family Medicine

## 2020-04-22 ENCOUNTER — Other Ambulatory Visit: Payer: Self-pay | Admitting: Family Medicine

## 2020-04-22 VITALS — BP 143/82 | HR 69 | Temp 98.9°F | Ht 58.66 in | Wt 135.4 lb

## 2020-04-22 DIAGNOSIS — E782 Mixed hyperlipidemia: Secondary | ICD-10-CM

## 2020-04-22 DIAGNOSIS — I2510 Atherosclerotic heart disease of native coronary artery without angina pectoris: Secondary | ICD-10-CM | POA: Diagnosis not present

## 2020-04-22 DIAGNOSIS — Z170 Estrogen receptor positive status [ER+]: Secondary | ICD-10-CM | POA: Diagnosis not present

## 2020-04-22 DIAGNOSIS — Z0000 Encounter for general adult medical examination without abnormal findings: Secondary | ICD-10-CM

## 2020-04-22 DIAGNOSIS — D692 Other nonthrombocytopenic purpura: Secondary | ICD-10-CM

## 2020-04-22 DIAGNOSIS — I739 Peripheral vascular disease, unspecified: Secondary | ICD-10-CM

## 2020-04-22 DIAGNOSIS — K219 Gastro-esophageal reflux disease without esophagitis: Secondary | ICD-10-CM

## 2020-04-22 DIAGNOSIS — C50412 Malignant neoplasm of upper-outer quadrant of left female breast: Secondary | ICD-10-CM | POA: Diagnosis not present

## 2020-04-22 DIAGNOSIS — M1A9XX0 Chronic gout, unspecified, without tophus (tophi): Secondary | ICD-10-CM

## 2020-04-22 DIAGNOSIS — I129 Hypertensive chronic kidney disease with stage 1 through stage 4 chronic kidney disease, or unspecified chronic kidney disease: Secondary | ICD-10-CM

## 2020-04-22 DIAGNOSIS — F411 Generalized anxiety disorder: Secondary | ICD-10-CM

## 2020-04-22 LAB — MICROALBUMIN, URINE WAIVED
Creatinine, Urine Waived: 50 mg/dL (ref 10–300)
Microalb, Ur Waived: 10 mg/L (ref 0–19)

## 2020-04-22 LAB — URINALYSIS, ROUTINE W REFLEX MICROSCOPIC
Bilirubin, UA: NEGATIVE
Glucose, UA: NEGATIVE
Ketones, UA: NEGATIVE
Leukocytes,UA: NEGATIVE
Nitrite, UA: NEGATIVE
Protein,UA: NEGATIVE
RBC, UA: NEGATIVE
Specific Gravity, UA: <1.005 — ABNORMAL LOW (ref 1.005–1.030)
Urobilinogen, Ur: 0.2 mg/dL (ref 0.2–1.0)
pH, UA: 5.5 (ref 5.0–7.5)

## 2020-04-22 MED ORDER — BENAZEPRIL HCL 40 MG PO TABS
40.0000 mg | ORAL_TABLET | Freq: Every day | ORAL | 1 refills | Status: DC
Start: 2020-04-22 — End: 2020-04-22

## 2020-04-22 MED ORDER — ATORVASTATIN CALCIUM 40 MG PO TABS
40.0000 mg | ORAL_TABLET | Freq: Every day | ORAL | 1 refills | Status: DC
Start: 2020-04-22 — End: 2020-04-22

## 2020-04-22 MED ORDER — CLONAZEPAM 0.5 MG PO TABS: 0.2500 mg | ORAL_TABLET | Freq: Two times a day (BID) | ORAL | 0 refills | Status: DC | PRN

## 2020-04-22 MED ORDER — ATORVASTATIN CALCIUM 40 MG PO TABS: 40.0000 mg | ORAL_TABLET | Freq: Every day | ORAL | 1 refills | Status: DC

## 2020-04-22 MED ORDER — SERTRALINE HCL 100 MG PO TABS: 150.0000 mg | ORAL_TABLET | Freq: Every day | ORAL | 1 refills | Status: DC

## 2020-04-22 MED ORDER — HYDRALAZINE HCL 25 MG PO TABS: 25.0000 mg | ORAL_TABLET | Freq: Two times a day (BID) | ORAL | 1 refills | Status: DC

## 2020-04-22 MED ORDER — ATENOLOL 50 MG PO TABS
50.0000 mg | ORAL_TABLET | Freq: Every day | ORAL | 1 refills | Status: DC
Start: 2020-04-22 — End: 2020-04-22

## 2020-04-22 MED ORDER — BENAZEPRIL HCL 40 MG PO TABS
40.0000 mg | ORAL_TABLET | Freq: Every day | ORAL | 1 refills | Status: DC
Start: 2020-04-22 — End: 2020-09-29

## 2020-04-22 MED ORDER — OMEPRAZOLE 40 MG PO CPDR: 40.0000 mg | DELAYED_RELEASE_CAPSULE | Freq: Two times a day (BID) | ORAL | 0 refills | Status: DC

## 2020-04-22 MED ORDER — ATENOLOL 50 MG PO TABS
50.0000 mg | ORAL_TABLET | Freq: Every day | ORAL | 1 refills | Status: DC
Start: 2020-04-22 — End: 2020-09-29

## 2020-04-22 MED ORDER — ATORVASTATIN CALCIUM 40 MG PO TABS
40.0000 mg | ORAL_TABLET | Freq: Every day | ORAL | 1 refills | Status: DC
Start: 2020-04-22 — End: 2020-09-29

## 2020-04-22 MED ORDER — BENAZEPRIL HCL 40 MG PO TABS: 40.0000 mg | ORAL_TABLET | Freq: Every day | ORAL | 1 refills | Status: DC

## 2020-04-22 MED ORDER — SERTRALINE HCL 100 MG PO TABS
150.0000 mg | ORAL_TABLET | Freq: Every day | ORAL | 1 refills | Status: DC
Start: 2020-04-22 — End: 2020-09-29

## 2020-04-22 MED ORDER — SERTRALINE HCL 100 MG PO TABS
150.0000 mg | ORAL_TABLET | Freq: Every day | ORAL | 1 refills | Status: DC
Start: 2020-04-22 — End: 2020-04-22

## 2020-04-22 MED ORDER — AMLODIPINE BESYLATE 10 MG PO TABS
10.0000 mg | ORAL_TABLET | Freq: Every day | ORAL | 1 refills | Status: DC
Start: 2020-04-22 — End: 2020-04-22

## 2020-04-22 MED ORDER — HYDRALAZINE HCL 25 MG PO TABS
25.0000 mg | ORAL_TABLET | Freq: Two times a day (BID) | ORAL | 1 refills | Status: DC
Start: 2020-04-22 — End: 2020-04-22

## 2020-04-22 MED ORDER — AMLODIPINE BESYLATE 10 MG PO TABS
10.0000 mg | ORAL_TABLET | Freq: Every day | ORAL | 1 refills | Status: DC
Start: 2020-04-22 — End: 2020-09-29

## 2020-04-22 MED ORDER — HYDRALAZINE HCL 25 MG PO TABS
25.0000 mg | ORAL_TABLET | Freq: Two times a day (BID) | ORAL | 1 refills | Status: DC
Start: 2020-04-22 — End: 2020-09-29

## 2020-04-22 MED ORDER — FLUTICASONE PROPIONATE 50 MCG/ACT NA SUSP: 2.0000 | Freq: Every day | NASAL | 12 refills | Status: DC

## 2020-04-22 NOTE — Assessment & Plan Note (Signed)
Stable. Will keep BP and cholesterol under good control. Continue to monitor. Call with any concerns.

## 2020-04-22 NOTE — Telephone Encounter (Signed)
Requested Prescriptions  Pending Prescriptions Disp Refills  . sertraline (ZOLOFT) 100 MG tablet 135 tablet 1    Sig: Take 1.5 tablets (150 mg total) by mouth daily.     Psychiatry:  Antidepressants - SSRI Passed - 04/22/2020 10:36 AM      Passed - Valid encounter within last 6 months    Recent Outpatient Visits          Today Routine general medical examination at a health care facility   [facility], [place], DO   2 months ago Benign hypertensive renal disease   [facility] [place], Megan P, DO   3 months ago Acute anxiety   [place], [place], DO   4 months ago Diarrhea of presumed infectious origin   [facility], Megan P, DO   4 months ago Non-recurrent acute suppurative otitis media of right ear without spontaneous rupture of tympanic membrane   [place], [place], DO      Future Appointments            Tomorrow Vanga, Tally Due, MD [facility] [place]   In 5 months Valerie Roys, DO [place], [facility]   In 11 months  [employer] [employer], [facility]           . hydrALAZINE (APRESOLINE) 25 MG tablet 180 tablet 1    Sig: Take 1 tablet (25 mg total) by mouth 2 (two) times daily.     Cardiovascular:  Vasodilators Failed - 04/22/2020 10:36 AM      Failed - Last BP in normal range    BP Readings from Last 1 Encounters:  04/22/20 (!) 143/82         Passed - HCT in normal range and within 360 days    Hematocrit  Date Value Ref Range Status  10/10/2019 40.2 34.0 - 46.6 % Final         Passed - HGB in normal range and within 360 days    Hemoglobin  Date Value Ref Range Status  10/10/2019 13.4 11.1 - 15.9 g/dL Final         Passed - RBC in normal range and within 360 days    RBC  Date Value Ref Range Status  10/10/2019 4.33 3.77 - 5.28 x10E6/uL Final  04/15/2018 3.93 3.80 - 5.20 MIL/uL Final         Passed - WBC in normal range and within 360 days     WBC  Date Value Ref Range Status  10/10/2019 6.7 3.4 - 10.8 x10E3/uL Final  04/15/2018 4.8 3.6 - 11.0 K/uL Final         Passed - PLT in normal range and within 360 days    Platelets  Date Value Ref Range Status  10/10/2019 211 150 - 450 x10E3/uL Final         Passed - Valid encounter within last 12 months    Recent Outpatient Visits          Today Routine general medical examination at a health care facility   [facility], [place], DO   2 months ago Benign hypertensive renal disease   [place], Megan P, DO   3 months ago Acute anxiety   [facility] [place], [place], DO   4 months ago Diarrhea of presumed infectious origin   [facility], Megan P, DO   4 months ago Non-recurrent acute  suppurative otitis media of right ear without spontaneous rupture of tympanic membrane   [place], [place], DO      Future Appointments            Tomorrow Vanga, Tally Due, MD [facility] [place]   In 5 months Wynetta Emery, Barb Merino, DO [facility], [facility]   In 11 months  [facility] [employer], [facility]           . benazepril (LOTENSIN) 40 MG tablet 90 tablet 1    Sig: Take 1 tablet (40 mg total) by mouth daily.     Cardiovascular:  ACE Inhibitors Failed - 04/22/2020 10:36 AM      Failed - K in normal range and within 180 days    Potassium  Date Value Ref Range Status  10/10/2019 4.0 3.5 - 5.2 mmol/L Final  01/20/2013 4.0 3.5 - 5.1 mmol/L Final         Failed - Last BP in normal range    BP Readings from Last 1 Encounters:  04/22/20 (!) 143/82         Passed - Cr in normal range and within 180 days    Creatinine  Date Value Ref Range Status  01/20/2013 1.05 0.60 - 1.30 mg/dL Final   Creatinine, Ser  Date Value Ref Range Status  12/22/2019 0.98 0.44 - 1.00 mg/dL Final         Passed - Patient is not pregnant      Passed - Valid encounter within last 6 months     Recent Outpatient Visits          Today Routine general medical examination at a health care facility   [facility], [place] P, DO   2 months ago Benign hypertensive renal disease   [place], Megan P, DO   3 months ago Acute anxiety   [place], [employer], DO   4 months ago Diarrhea of presumed infectious origin   [employer], Megan P, DO   4 months ago Non-recurrent acute suppurative otitis media of right ear without spontaneous rupture of tympanic membrane   [place], [place], DO      Future Appointments            Tomorrow Vanga, Tally Due, MD [place]   In 5 months Valerie Roys, DO [place], [facility]   In 73 months  [employer], [place]           . atorvastatin (LIPITOR) 40 MG tablet 90 tablet 1    Sig: Take 1 tablet (40 mg total) by mouth daily.     Cardiovascular:  Antilipid - Statins Failed - 04/22/2020 10:36 AM      Failed - LDL in normal range and within 360 days    LDL Chol Calc (NIH)  Date Value Ref Range Status  10/10/2019 56 0 - 99 mg/dL Final         Failed - Triglycerides in normal range and within 360 days    Triglycerides  Date Value Ref Range Status  10/10/2019 153 (H) 0 - 149 mg/dL Final   Triglycerides Piccolo,Waived  Date Value Ref Range Status  06/21/2016 136 <150 mg/dL Final    Comment:                            Normal                   <  150                         Borderline High     150 - 199                         High                200 - 499                         Very High                >499          Passed - Total Cholesterol in normal range and within 360 days    Cholesterol, Total  Date Value Ref Range Status  10/10/2019 138 100 - 199 mg/dL Final   Cholesterol Piccolo, Waived  Date Value Ref Range Status  06/21/2016 129 <200 mg/dL Final    Comment:                             Desirable                <200                         Borderline High      200- 239                         High                     >239          Passed - HDL in normal range and within 360 days    HDL  Date Value Ref Range Status  10/10/2019 56 >39 mg/dL Final         Passed - Patient is not pregnant      Passed - Valid encounter within last 12 months    Recent Outpatient Visits          Today Routine general medical examination at a health care facility   [facility], [place], DO   2 months ago Benign hypertensive renal disease   [facility] [place], Megan P, DO   3 months ago Acute anxiety   [place], [place], DO   4 months ago Diarrhea of presumed infectious origin   [employer], Megan P, DO   4 months ago Non-recurrent acute suppurative otitis media of right ear without spontaneous rupture of tympanic membrane   [place], [place], DO      Future Appointments            Tomorrow Vanga, Tally Due, MD [facility] [place]   In 5 months [place], Barb Merino, DO [place], [facility]   In 11 months  [facility], [facility]           . atenolol (TENORMIN) 50 MG tablet 90 tablet 1    Sig: Take 1 tablet (50 mg total) by mouth daily.     Cardiovascular:  Beta Blockers Failed - 04/22/2020 10:36 AM      Failed - Last BP in normal range    BP Readings from Last 1 Encounters:  04/22/20 (!) 143/82  Passed - Last Heart Rate in normal range    Pulse Readings from Last 1 Encounters:  04/22/20 69         Passed - Valid encounter within last 6 months    Recent Outpatient Visits          Today Routine general medical examination at a health care facility   [facility], [place] P, DO   2 months ago Benign hypertensive renal disease   [facility] [place], Megan P, DO   3 months ago Acute anxiety   [place], [place], DO   4 months ago Diarrhea of presumed infectious origin   [facility], Megan P, DO   4 months ago Non-recurrent acute suppurative otitis media of right ear without spontaneous rupture of tympanic membrane   [place], [place], DO      Future Appointments            Tomorrow Vanga, Tally Due, MD [facility] [place]   In 5 months Wynetta Emery, Barb Merino, DO [place], [facility]   In 11 months  [facility], [facility]           . amLODipine (NORVASC) 10 MG tablet 90 tablet 1    Sig: Take 1 tablet (10 mg total) by mouth daily.     Cardiovascular:  Calcium Channel Blockers Failed - 04/22/2020 10:36 AM      Failed - Last BP in normal range    BP Readings from Last 1 Encounters:  04/22/20 (!) 143/82         Passed - Valid encounter within last 6 months    Recent Outpatient Visits          Today Routine general medical examination at a health care facility   [facility], [place] P, DO   2 months ago Benign hypertensive renal disease   [facility] [place], Megan P, DO   3 months ago Acute anxiety   [place], [place], DO   4 months ago Diarrhea of presumed infectious origin   [facility], Megan P, DO   4 months ago Non-recurrent acute suppurative otitis media of right ear without spontaneous rupture of tympanic membrane   [place], [place], DO      Future Appointments            Tomorrow Vanga, Tally Due, MD [place]   In 5 months Valerie Roys, DO [place], [place]   In 78 months  [employer], [place]

## 2020-04-22 NOTE — Assessment & Plan Note (Signed)
Under good control on current regimen. Continue current regimen. Continue to monitor. Call with any concerns. Refills given. Labs drawn today.   

## 2020-04-22 NOTE — Progress Notes (Signed)
BP (!) 143/82 (BP Location: Left Arm, Patient Position: Sitting, Cuff Size: Normal)   Pulse 69   Temp 98.9 F (37.2 C) (Oral)   Ht 4' 10.66" (1.49 m)   Wt 135 lb 6.4 oz (61.4 kg)   SpO2 99%   BMI 27.66 kg/m    Subjective:    Patient ID: Marie Parker, female    DOB: 1945/10/25, 74 y.o.   MRN: [id number]  HPI: Marie Parker is a 74 y.o. female presenting on 04/22/2020 for comprehensive medical examination. Current medical complaints include:  Having bad pain in her foot. Wants to see a podiatrist.  Continues with a bad taste in her mouth, has not seen dentist.   HYPERTENSION / HYPERLIPIDEMIA Satisfied with current treatment? no Duration of hypertension: chronic BP monitoring frequency: not checking BP medication side effects: no Past BP meds: amlodipine, benazepril, atenolol, hydralazine Duration of hyperlipidemia: chronic Cholesterol medication side effects: no Cholesterol supplements: none Past cholesterol medications: atorvastatin Medication compliance: excellent compliance Aspirin: yes Recent stressors: yes Recurrent headaches: no Visual changes: no Palpitations: no Dyspnea: no Chest pain: no Lower extremity edema: no Dizzy/lightheaded: no  ANXIETY/DEPRESSION- feeling better now.  Duration: Chronic Status:better Anxious mood: yes  Excessive worrying: yes Irritability: yes  Sweating: no Nausea: no Palpitations:yes Hyperventilation: no Panic attacks: yes Agoraphobia: no  Obscessions/compulsions: no Depressed mood: yes Depression screen [facility] 2/9 04/22/2020 04/05/2020 02/20/2020 01/23/2020 01/14/2020  Decreased Interest 0 0 1 2 2   Down, Depressed, Hopeless 0 0 2 0 0  PHQ - 2 Score 0 0 3 2 2   Altered sleeping 0 0 0 0 0  Tired, decreased energy 0 0 1 2 2   Change in appetite 0 0 0 1 1  Feeling bad or failure about yourself  0 0 0 2 2  Trouble concentrating 0 0 0 0 0  Moving slowly or fidgety/restless 0 0 0 2 2  Suicidal thoughts 0 0 0 0 0  PHQ-9 Score 0 0  4 9 9   Difficult doing work/chores Not difficult at all Not difficult at all Somewhat difficult Not difficult at all Not difficult at all  Some recent data might be hidden   GAD 7 : Generalized Anxiety Score 04/22/2020 02/20/2020 01/14/2020 07/31/2019  Nervous, Anxious, on Edge 1 2 3 1   Control/stop worrying 0 2 3 0  Worry too much - different things 1 2 2  0  Trouble relaxing 1 1 1  0  Restless 0 1 0 0  Easily annoyed or irritable 0 1 1 1   Afraid - awful might happen 1 1 3 1   Total GAD 7 Score 4 10 13 3   Anxiety Difficulty Somewhat difficult Not difficult at all Not difficult at all Not difficult at all   Anhedonia: no Weight changes: no Insomnia: no   Hypersomnia: yes Fatigue/loss of energy: yes Feelings of worthlessness: no Feelings of guilt: yes Impaired concentration/indecisiveness: yes Suicidal ideations: no  Crying spells: no Recent Stressors/Life Changes: yes   Relationship problems: no   Family stress: yes     Financial stress: yes    Job stress: no    Recent death/loss: no  No gout flares. Tolerating the medicine well.   She currently lives with: husband Menopausal Symptoms: no  Depression Screen done today and results listed below:  Depression screen [facility] 2/9 04/22/2020 04/05/2020 02/20/2020 01/23/2020 01/14/2020  Decreased Interest 0 0 1 2 2   Down, Depressed, Hopeless 0 0 2 0 0  PHQ - 2 Score 0 0 3 2 2  Altered sleeping 0 0 0 0 0  Tired, decreased energy 0 0 1 2 2   Change in appetite 0 0 0 1 1  Feeling bad or failure about yourself  0 0 0 2 2  Trouble concentrating 0 0 0 0 0  Moving slowly or fidgety/restless 0 0 0 2 2  Suicidal thoughts 0 0 0 0 0  PHQ-9 Score 0 0 4 9 9   Difficult doing work/chores Not difficult at all Not difficult at all Somewhat difficult Not difficult at all Not difficult at all  Some recent data might be hidden    Past Medical History:  Past Medical History:  Diagnosis Date  . Anxiety   . Arthritis   . Breast cancer ([place])   . Breast  cancer of upper-outer quadrant of left female breast ([place]) 04/22/2018   16 mm, T1c, N0.  ER/PR positive, HER-2/neu not overexpressed.  Low MammaPrint score.    [name] CAD (coronary artery disease)   . Cancer (HCC)    squamous cell  . Cataract   . Cholecystitis 04/10/2018  . Chronic kidney disease   . Colon cancer screening 2017?   negative cologuard  . Depression   . Family history of adverse reaction to anesthesia    mother had N/V  . Generalized osteoarthritis   . GERD (gastroesophageal reflux disease)   . Gout   . History of endoscopy 11/2012   Gastritis  . Hyperlipidemia   . Hypertension   . IFG (impaired fasting glucose)   . Insomnia   . Menopause   . Morton's neuroma of right foot 11/28/2019  . Osteoporosis   . Parathyroid adenoma   . Personal history of radiation therapy 2020   Left breast  . Personal history of tobacco use, presenting hazards to health 02/02/2016  . PVD (peripheral vascular disease) ([place])   . PVD (peripheral vascular disease) ([place])   . Rotator cuff syndrome of shoulder and allied disorders   . Tobacco abuse     Surgical History:  Past Surgical History:  Procedure Laterality Date  . BREAST BIOPSY Left 2014   COMPLEX FIBROADENOMA  . BREAST BIOPSY Left 04/05/2018   [facility]  . BREAST EXCISIONAL BIOPSY Left 1970's   neg  . BREAST FIBROADENOMA SURGERY    . BREAST LUMPECTOMY    . BREAST MAMMOSITE Left 04/2018  . CARDIAC CATHETERIZATION  09/17/2007   Insignificant CAD with 40-50% stenosis of mid LAD.   [name] COLONOSCOPY WITH PROPOFOL N/A 02/09/2020   Procedure: COLONOSCOPY WITH PROPOFOL;  Surgeon: Lin Landsman, MD;  Location: [facility] ENDOSCOPY;  Service: Gastroenterology;  Laterality: N/A;  . ESOPHAGOGASTRODUODENOSCOPY (EGD) WITH PROPOFOL N/A 02/09/2020   Procedure: ESOPHAGOGASTRODUODENOSCOPY (EGD) WITH PROPOFOL;  Surgeon: Lin Landsman, MD;  Location: [facility] ENDOSCOPY;  Service: Gastroenterology;  Laterality: N/A;  . ILIAC ARTERY STENT     right iliac artery  stent   . MASTECTOMY, PARTIAL Left 04/22/2018   Procedure: MASTECTOMY PARTIAL;  Surgeon: Robert Bellow, MD;  Location: [facility] ORS;  Service: General;  Laterality: Left;  . ORIF PATELLA Left 03/22/2016   Dr. Tristan Schroeder  . PARATHYROIDECTOMY  2012   [facility]  . SENTINEL NODE BIOPSY Left 04/22/2018   Procedure: SENTINEL NODE BIOPSY;  Surgeon: Robert Bellow, MD;  Location: [facility] ORS;  Service: General;  Laterality: Left;  . SKIN BIOPSY     precancerous    Medications:  Current Outpatient Medications on File Prior to Visit  Medication Sig  . acetaminophen (TYLENOL) 650 MG CR tablet  Take 1,950 mg by mouth daily as needed for pain.  [name] amitriptyline (ELAVIL) 25 MG tablet Take 1 tablet (25 mg total) by mouth at bedtime.  [name] aspirin 81 MG tablet Take 1 tablet (81 mg total) by mouth daily. (Patient taking differently: Take 81 mg by mouth at bedtime. )  . CALCIUM PO Take by mouth.  . clobetasol ointment (TEMOVATE) 0.05 % Daily at night for 6-12 weeks, if improvement, decrease to 2-3x per week  . letrozole (FEMARA) 2.5 MG tablet Take 1 tablet (2.5 mg total) by mouth daily.  . niacin 500 MG tablet Take 1,000 mg by mouth at bedtime.  . Probiotic Product (PROBIOTIC-10 PO) Take by mouth daily.  . traMADol (ULTRAM) 50 MG tablet Take 50 mg by mouth every 6 (six) hours.  . Turmeric 500 MG TABS Take 500 mg by mouth daily.  . Loperamide HCl (IMODIUM PO) Take by mouth.  (Patient not taking: Reported on 04/22/2020)  . tiZANidine (ZANAFLEX) 4 MG tablet TAKE 1 TABLET(4 MG) BY MOUTH EVERY 6 HOURS AS NEEDED FOR MUSCLE SPASMS (Patient not taking: Reported on 04/22/2020)   No current facility-administered medications on file prior to visit.    Allergies:  Allergies  Allergen Reactions  . Alendronate Nausea Only and Other (See Comments)    Other reaction(s): NAUSEA GI upset  . Amoxicillin-Pot Clavulanate Diarrhea  . Crestor [Rosuvastatin] Nausea Only  . Cyclobenzaprine Other (See Comments)    Vertigo, couldn't  focus eyes  . Erythromycin Other (See Comments)    GI upset   . Fosamax [Alendronate Sodium] Nausea And Vomiting    Social History:  Social History   Socioeconomic History  . Marital status: Married    Spouse name: Not on file  . Number of children: Not on file  . Years of education: Not on file  . Highest education level: High school graduate  Occupational History  . Occupation: part time [employer]   Tobacco Use  . Smoking status: Former Smoker    Packs/day: 1.00    Years: 42.00    Pack years: 42.00    Types: Cigarettes    Quit date: 08/28/2000    Years since quitting: 19.6  . Smokeless tobacco: Never Used  Vaping Use  . Vaping Use: Never used  Substance and Sexual Activity  . Alcohol use: No  . Drug use: No  . Sexual activity: Not Currently  Other Topics Concern  . 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 Difficulty of Paying Living Expenses: Not hard at all  Food Insecurity: No Food Insecurity  . Worried About Charity fundraiser in the Last Year: Never true  . Ran Out of Food in the Last Year: Never true  Transportation Needs: No Transportation Needs  . Lack of Transportation (Medical): No  . Lack of Transportation (Non-Medical): No  Physical Activity: Inactive  . Days of Exercise per Week: 0 days  . Minutes of Exercise per Session: 0 min  Stress: Stress Concern Present  . Feeling of Stress : To some extent  Social Connections: Socially Integrated  . Frequency of Communication with Friends and Family: More than three times a week  . Frequency of Social Gatherings with Friends and Family: More than three times a week  . Attends Religious Services: More than 4 times per year  .  Active Member of Clubs or Organizations: Yes  . Attends Archivist Meetings: More than 4 times per year  . Marital Status: Married    Human resources officer Violence: Not At Risk  . Fear of Current or Ex-Partner: No  . Emotionally Abused: No  . Physically Abused: No  . Sexually Abused: No   Social History   Tobacco Use  Smoking Status Former Smoker  . Packs/day: 1.00  . Years: 42.00  . Pack years: 42.00  . Types: Cigarettes  . Quit date: 08/28/2000  . Years since quitting: 19.6  Smokeless Tobacco Never Used   Social History   Substance and Sexual Activity  Alcohol Use No    Family History:  Family History  Problem Relation Age of Onset  . Heart attack Father 39  . Alcohol abuse Father   . Heart disease Father   . Liver disease Father   . Hypertension Father   . Heart disease Sister 53       CAD; s/p stent placement  . Stroke Sister   . AAA (abdominal aortic aneurysm) Sister   . Dementia Sister   . Stroke Mother   . Hypertension Mother   . Diabetes Mother   . Stroke Maternal Grandmother   . Heart attack Maternal Grandfather   . Stroke Paternal Grandmother   . Breast cancer Maternal Aunt 65  . Breast cancer Paternal Aunt   . Bone cancer Paternal Uncle     Past medical history, surgical history, medications, allergies, family history and social history reviewed with patient today and changes made to appropriate areas of the chart.   Review of Systems  Constitutional: Positive for diaphoresis. Negative for chills, fever, malaise/fatigue and weight loss.  HENT: Negative.   Eyes: Positive for blurred vision. Negative for double vision, photophobia, pain, discharge and redness.  Respiratory: Negative.   Cardiovascular: Negative.   Gastrointestinal: Positive for abdominal pain and heartburn. Negative for blood in stool, constipation, diarrhea, melena, nausea and vomiting.  Genitourinary: Negative.   Musculoskeletal: Positive for joint pain. Negative for back pain, falls, myalgias and neck pain.  Skin: Negative.   Neurological: Positive for tingling. Negative for dizziness, tremors, sensory change,  speech change, focal weakness, seizures, loss of consciousness, weakness and headaches.  Endo/Heme/Allergies: Negative for environmental allergies and polydipsia. Bruises/bleeds easily.  Psychiatric/Behavioral: Positive for depression. Negative for hallucinations, memory loss, substance abuse and suicidal ideas. The patient is nervous/anxious. The patient does not have insomnia.     All other ROS negative except what is listed above and in the HPI.      Objective:    BP (!) 143/82 (BP Location: Left Arm, Patient Position: Sitting, Cuff Size: Normal)   Pulse 69   Temp 98.9 F (37.2 C) (Oral)   Ht 4' 10.66" (1.49 m)   Wt 135 lb 6.4 oz (61.4 kg)   SpO2 99%   BMI 27.66 kg/m   Wt Readings from Last 3 Encounters:  04/22/20 135 lb 6.4 oz (61.4 kg)  04/05/20 133 lb 12.8 oz (60.7 kg)  03/08/20 135 lb (61.2 kg)    Physical Exam Vitals and nursing note reviewed.  Constitutional:      General: She is not in acute distress.    Appearance: Normal appearance. She is not ill-appearing, toxic-appearing or diaphoretic.  HENT:     Head: Normocephalic and atraumatic.     Right Ear: Tympanic membrane, ear canal and external ear normal. There is no impacted cerumen.     Left Ear:  Tympanic membrane, ear canal and external ear normal. There is no impacted cerumen.     Nose: Nose normal. No congestion or rhinorrhea.     Mouth/Throat:     Mouth: Mucous membranes are moist.     Pharynx: Oropharynx is clear. No oropharyngeal exudate or posterior oropharyngeal erythema.  Eyes:     General: No scleral icterus.       Right eye: No discharge.        Left eye: No discharge.     Extraocular Movements: Extraocular movements intact.     Conjunctiva/sclera: Conjunctivae normal.     Pupils: Pupils are equal, round, and reactive to light.  Neck:     Vascular: No carotid bruit.  Cardiovascular:     Rate and Rhythm: Normal rate and regular rhythm.     Pulses: Normal pulses.     Heart sounds: No murmur heard.   No friction rub. No gallop.   Pulmonary:     Effort: Pulmonary effort is normal. No respiratory distress.     Breath sounds: Normal breath sounds. No stridor. No wheezing, rhonchi or rales.  Chest:     Chest wall: No tenderness.  Abdominal:     General: Abdomen is flat. Bowel sounds are normal. There is no distension.     Palpations: Abdomen is soft. There is no mass.     Tenderness: There is no abdominal tenderness. There is no right CVA tenderness, left CVA tenderness, guarding or rebound.     Hernia: No hernia is present.  Genitourinary:    Comments: Breast and pelvic exams deferred with shared decision making Musculoskeletal:        General: No swelling, tenderness, deformity or signs of injury.     Cervical back: Normal range of motion and neck supple. No rigidity. No muscular tenderness.     Right lower leg: No edema.     Left lower leg: No edema.  Lymphadenopathy:     Cervical: No cervical adenopathy.  Skin:    General: Skin is warm and dry.     Capillary Refill: Capillary refill takes less than 2 seconds.     Coloration: Skin is not jaundiced or pale.     Findings: No bruising, erythema, lesion or rash.  Neurological:     General: No focal deficit present.     Mental Status: She is alert and oriented to person, place, and time. Mental status is at baseline.     Cranial Nerves: No cranial nerve deficit.     Sensory: No sensory deficit.     Motor: No weakness.     Coordination: Coordination normal.     Gait: Gait normal.     Deep Tendon Reflexes: Reflexes normal.  Psychiatric:        Mood and Affect: Mood normal.        Behavior: Behavior normal.        Thought Content: Thought content normal.        Judgment: Judgment normal.     Results for orders placed or performed in visit on 04/22/20  Microalbumin, Urine Waived  Result Value Ref Range   Microalb, Ur Waived 10 0 - 19 mg/L   Creatinine, Urine Waived 50 10 - 300 mg/dL   Microalb/Creat Ratio 30-300 (H) <30  mg/g  Urinalysis, Routine w reflex microscopic  Result Value Ref Range   Specific Gravity, UA <1.005 (L) 1.005 - 1.030   pH, UA 5.5 5.0 - 7.5   Color, UA Yellow Yellow  Appearance Ur Clear Clear   Leukocytes,UA Negative Negative   Protein,UA Negative Negative/Trace   Glucose, UA Negative Negative   Ketones, UA Negative Negative   RBC, UA Negative Negative   Bilirubin, UA Negative Negative   Urobilinogen, Ur 0.2 0.2 - 1.0 mg/dL   Nitrite, UA Negative Negative      Assessment & Plan:   Problem List Items Addressed This Visit      Cardiovascular and Mediastinum   Peripheral vascular disease ([place])    Stable. Will keep BP and cholesterol under good control. Continue to monitor. Call with any concerns.       Relevant Orders   CBC with Differential/Platelet   Comprehensive metabolic panel   TSH   CAD (coronary artery disease)    Stable. Will keep BP and cholesterol under good control. Continue to monitor. Call with any concerns.       Relevant Orders   CBC with Differential/Platelet   Comprehensive metabolic panel   TSH   Senile purpura (HCC)    Stable. Reassured patient. Call with any concerns.       Relevant Orders   CBC with Differential/Platelet   Comprehensive metabolic panel   TSH     Digestive   GERD (gastroesophageal reflux disease)    Under good control on current regimen. Continue current regimen. Continue to monitor. Call with any concerns. Refills given. Labs drawn today.       Relevant Medications   omeprazole (PRILOSEC) 40 MG capsule   Other Relevant Orders   CBC with Differential/Platelet   Comprehensive metabolic panel   TSH     Genitourinary   Benign hypertensive renal disease    Under good control on current regimen. Continue current regimen. Continue to monitor. Call with any concerns. Refills given. Labs drawn today       Relevant Orders   CBC with Differential/Platelet   Comprehensive metabolic panel   Microalbumin, Urine Waived  (Completed)   TSH   Urinalysis, Routine w reflex microscopic (Completed)     Other   Hyperlipidemia    Under good control on current regimen. Continue current regimen. Continue to monitor. Call with any concerns. Refills given. Labs drawn today       Relevant Orders   CBC with Differential/Platelet   Comprehensive metabolic panel   Lipid Panel w/o Chol/HDL Ratio   TSH   Gout    Under good control on current regimen. Continue current regimen. Continue to monitor. Call with any concerns. Refills given. Labs drawn today.       Relevant Orders   CBC with Differential/Platelet   Comprehensive metabolic panel   TSH   Uric acid   GAD (generalized anxiety disorder)    Under good control on current regimen. Continue current regimen. Continue to monitor. Call with any concerns. Refills given. Labs drawn today.       Relevant Orders   CBC with Differential/Platelet   Comprehensive metabolic panel   TSH    Other Visit Diagnoses    Routine general medical examination at a health care facility    -  Primary   Vaccines up to date. Screening labs checked today. Mammogram and colonoscopy up to date. Continue diet and exercise. Call with any concerns.        Follow up plan: Return in about 6 months (around 10/23/2020).   LABORATORY TESTING:  - Pap smear: not applicable  IMMUNIZATIONS:   - Tdap: Tetanus vaccination status reviewed: last tetanus booster within 10 years. - Influenza:  Postponed to flu season - Pneumovax: Up to date - Prevnar: Up to date - COVID: Up to date  SCREENING: -Mammogram: Up to date  - Colonoscopy: Up to date  - Bone Density: Up to date   PATIENT COUNSELING:   Advised to take 1 mg of folate supplement per day if capable of pregnancy.   Sexuality: Discussed sexually transmitted diseases, partner selection, use of condoms, avoidance of unintended pregnancy  and contraceptive alternatives.   Advised to avoid cigarette smoking.  I discussed with the  patient that most people either abstain from alcohol or drink within safe limits (<=14/week and <=4 drinks/occasion for males, <=7/weeks and <= 3 drinks/occasion for females) and that the risk for alcohol disorders and other health effects rises proportionally with the number of drinks per week and how often a drinker exceeds daily limits.  Discussed cessation/primary prevention of drug use and availability of treatment for abuse.   Diet: Encouraged to adjust caloric intake to maintain  or achieve ideal body weight, to reduce intake of dietary saturated fat and total fat, to limit sodium intake by avoiding high sodium foods and not adding table salt, and to maintain adequate dietary potassium and calcium preferably from fresh fruits, vegetables, and low-fat dairy products.    stressed the importance of regular exercise  Injury prevention: Discussed safety belts, safety helmets, smoke detector, smoking near bedding or upholstery.   Dental health: Discussed importance of regular tooth brushing, flossing, and dental visits.    NEXT PREVENTATIVE PHYSICAL DUE IN 1 YEAR. Return in about 6 months (around 10/23/2020).

## 2020-04-22 NOTE — Patient Instructions (Signed)
Health Maintenance After Age 73 After age 73, you are at a higher risk for certain long-term diseases and infections as well as injuries from falls. Falls are a major cause of broken bones and head injuries in people who are older than age 73. Getting regular preventive care can help to keep you healthy and well. Preventive care includes getting regular testing and making lifestyle changes as recommended by your health care provider. Talk with your health care provider about:  Which screenings and tests you should have. A screening is a test that checks for a disease when you have no symptoms.  A diet and exercise plan that is right for you. What should I know about screenings and tests to prevent falls? Screening and testing are the best ways to find a health problem early. Early diagnosis and treatment give you the best chance of managing medical conditions that are common after age 73. Certain conditions and lifestyle choices may make you more likely to have a fall. Your health care provider may recommend:  Regular vision checks. Poor vision and conditions such as cataracts can make you more likely to have a fall. If you wear glasses, make sure to get your prescription updated if your vision changes.  Medicine review. Work with your health care provider to regularly review all of the medicines you are taking, including over-the-counter medicines. Ask your health care provider about any side effects that may make you more likely to have a fall. Tell your health care provider if any medicines that you take make you feel dizzy or sleepy.  Osteoporosis screening. Osteoporosis is a condition that causes the bones to get weaker. This can make the bones weak and cause them to break more easily.  Blood pressure screening. Blood pressure changes and medicines to control blood pressure can make you feel dizzy.  Strength and balance checks. Your health care provider may recommend certain tests to check your  strength and balance while standing, walking, or changing positions.  Foot health exam. Foot pain and numbness, as well as not wearing proper footwear, can make you more likely to have a fall.  Depression screening. You may be more likely to have a fall if you have a fear of falling, feel emotionally low, or feel unable to do activities that you used to do.  Alcohol use screening. Using too much alcohol can affect your balance and may make you more likely to have a fall. What actions can I take to lower my risk of falls? General instructions  Talk with your health care provider about your risks for falling. Tell your health care provider if: ? You fall. Be sure to tell your health care provider about all falls, even ones that seem minor. ? You feel dizzy, sleepy, or off-balance.  Take over-the-counter and prescription medicines only as told by your health care provider. These include any supplements.  Eat a healthy diet and maintain a healthy weight. A healthy diet includes low-fat dairy products, low-fat (lean) meats, and fiber from whole grains, beans, and lots of fruits and vegetables. Home safety  Remove any tripping hazards, such as rugs, cords, and clutter.  Install safety equipment such as grab bars in bathrooms and safety rails on stairs.  Keep rooms and walkways well-lit. Activity   Follow a regular exercise program to stay fit. This will help you maintain your balance. Ask your health care provider what types of exercise are appropriate for you.  If you need a cane or   walker, use it as recommended by your health care provider.  Wear supportive shoes that have nonskid soles. Lifestyle  Do not drink alcohol if your health care provider tells you not to drink.  If you drink alcohol, limit how much you have: ? 0-1 drink a day for women. ? 0-2 drinks a day for men.  Be aware of how much alcohol is in your drink. In the [place] one drink equals one typical bottle of beer (12  oz), one-half glass of wine (5 oz), or one shot of hard liquor (1 oz).  Do not use any products that contain nicotine or tobacco, such as cigarettes and e-cigarettes. If you need help quitting, ask your health care provider. Summary  Having a healthy lifestyle and getting preventive care can help to protect your health and wellness after age 73.  Screening and testing are the best way to find a health problem early and help you avoid having a fall. Early diagnosis and treatment give you the best chance for managing medical conditions that are more common for people who are older than age 73.  Falls are a major cause of broken bones and head injuries in people who are older than age 73. Take precautions to prevent a fall at home.  Work with your health care provider to learn what changes you can make to improve your health and wellness and to prevent falls. This information is not intended to replace advice given to you by your health care provider. Make sure you discuss any questions you have with your health care provider. Document Revised: 12/05/2018 Document Reviewed: 06/27/2017 Elsevier Patient Education  2020 Elsevier Inc.  

## 2020-04-22 NOTE — Addendum Note (Signed)
Addended by: Mliss Sax on: 04/22/2020 10:36 AM   Modules accepted: Orders

## 2020-04-22 NOTE — Telephone Encounter (Signed)
PT need a refill atorvastatin (LIPITOR) 40 MG tablet [[id number]]  sertraline (ZOLOFT) 100 MG tablet [[id number]]  benazepril (LOTENSIN) 40 MG tablet [[id number]]  amLODipine (NORVASC) 10 MG tablet [[id number]]  atenolol (TENORMIN) 50 MG tablet [[id number]]  hydrALAZINE (APRESOLINE) 25 MG tablet [[id number]]  [facility], [address], [address], [place], [place] [address]  Phone:  [phone] Fax:  [phone]

## 2020-04-22 NOTE — Assessment & Plan Note (Signed)
Stable. Reassured patient. Call with any concerns.

## 2020-04-22 NOTE — Telephone Encounter (Signed)
Pt has been r/s for 09/272021 °

## 2020-04-23 ENCOUNTER — Encounter: Payer: Self-pay | Admitting: Gastroenterology

## 2020-04-23 ENCOUNTER — Ambulatory Visit (INDEPENDENT_AMBULATORY_CARE_PROVIDER_SITE_OTHER): Payer: Medicare Other | Admitting: Gastroenterology

## 2020-04-23 VITALS — BP 155/79 | HR 67 | Temp 97.8°F | Wt 135.5 lb

## 2020-04-23 DIAGNOSIS — F411 Generalized anxiety disorder: Secondary | ICD-10-CM | POA: Diagnosis not present

## 2020-04-23 DIAGNOSIS — K219 Gastro-esophageal reflux disease without esophagitis: Secondary | ICD-10-CM | POA: Diagnosis not present

## 2020-04-23 LAB — COMPREHENSIVE METABOLIC PANEL
ALT: 21 IU/L (ref 0–32)
AST: 27 IU/L (ref 0–40)
Albumin/Globulin Ratio: 1.8 (ref 1.2–2.2)
Albumin: 4.4 g/dL (ref 3.7–4.7)
Alkaline Phosphatase: 100 IU/L (ref 48–121)
BUN/Creatinine Ratio: 13 (ref 12–28)
BUN: 11 mg/dL (ref 8–27)
Bilirubin Total: 0.3 mg/dL (ref 0.0–1.2)
CO2: 26 mmol/L (ref 20–29)
Calcium: 9 mg/dL (ref 8.7–10.3)
Chloride: 102 mmol/L (ref 96–106)
Creatinine, Ser: 0.82 mg/dL (ref 0.57–1.00)
GFR calc Af Amer: 83 mL/min/{1.73_m2} (ref >59)
GFR calc non Af Amer: 72 mL/min/{1.73_m2} (ref >59)
Globulin, Total: 2.5 g/dL (ref 1.5–4.5)
Glucose: 90 mg/dL (ref 65–99)
Potassium: 4.5 mmol/L (ref 3.5–5.2)
Sodium: 141 mmol/L (ref 134–144)
Total Protein: 6.9 g/dL (ref 6.0–8.5)

## 2020-04-23 LAB — CBC WITH DIFFERENTIAL/PLATELET
Basophils Absolute: 0 10*3/uL (ref 0.0–0.2)
Basos: 0 %
EOS (ABSOLUTE): 0.3 10*3/uL (ref 0.0–0.4)
Eos: 5 %
Hematocrit: 38.8 % (ref 34.0–46.6)
Hemoglobin: 12.7 g/dL (ref 11.1–15.9)
Immature Grans (Abs): 0 10*3/uL (ref 0.0–0.1)
Immature Granulocytes: 0 %
Lymphocytes Absolute: 1.3 10*3/uL (ref 0.7–3.1)
Lymphs: 24 %
MCH: 30.5 pg (ref 26.6–33.0)
MCHC: 32.7 g/dL (ref 31.5–35.7)
MCV: 93 fL (ref 79–97)
Monocytes Absolute: 0.5 10*3/uL (ref 0.1–0.9)
Monocytes: 9 %
Neutrophils Absolute: 3.3 10*3/uL (ref 1.4–7.0)
Neutrophils: 62 %
Platelets: 181 10*3/uL (ref 150–450)
RBC: 4.16 x10E6/uL (ref 3.77–5.28)
RDW: 13 % (ref 11.7–15.4)
WBC: 5.4 10*3/uL (ref 3.4–10.8)

## 2020-04-23 LAB — LIPID PANEL W/O CHOL/HDL RATIO
Cholesterol, Total: 141 mg/dL (ref 100–199)
HDL: 57 mg/dL (ref >39)
LDL Chol Calc (NIH): 67 mg/dL (ref 0–99)
Triglycerides: 87 mg/dL (ref 0–149)
VLDL Cholesterol Cal: 17 mg/dL (ref 5–40)

## 2020-04-23 LAB — URIC ACID: Uric Acid: 6.8 mg/dL (ref 3.1–7.9)

## 2020-04-23 LAB — TSH: TSH: 1.69 u[IU]/mL (ref 0.450–4.500)

## 2020-04-23 MED ORDER — AMITRIPTYLINE HCL 25 MG PO TABS: 25.0000 mg | ORAL_TABLET | Freq: Every day | ORAL | 3 refills | Status: DC

## 2020-04-23 NOTE — Progress Notes (Signed)
Cephas Darby, MD [address]  [place]  [place], [place] [address]  Main: [phone]  Fax: [phone]    Gastroenterology Consultation  Referring Provider:     Valerie Roys, DO Primary Care Physician:  Valerie Roys, DO Primary Gastroenterologist:  Dr. Cephas Darby Reason for Consultation:     Acid reflux, personal history of tubular adenomas of colon        HPI:   Marie Parker is a 74 y.o. female referred by Dr. Wynetta Emery, Barb Merino, DO  for consultation & management of acid reflux.  Patient reports that she has chronic history of intermittent heartburn, however within last 6 months to 1 year, she has been going through a lot of anxiety and stress.  She reports heartburn, including nocturnal symptoms associated with abdominal bloating, epigastric pain, regurgitation.  She used to take Protonix in the past, due to worsening of symptoms this was switched to [place].  She could not afford due to high co-pay.  She is currently on Protonix 40 mg twice daily with partial relief.  She avoids coffee, chocolates and other triggers to help with her symptoms.  She denies any dysphagia.  She reports that Zantac really worked well in the past.  Patient had ear infection more than a month ago for which she was treated with antibiotics that led to diarrhea, persistent for 5 weeks.  C. difficile was negative.  She is no longer experiencing diarrhea.  She reports having 2-3 formed bowel movements which is her baseline.  She denies rectal bleeding, weight loss.  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  Review of labs revealed normal CBC, CMP  Patient underwent CT abdomen pelvis when she had bout of diarrhea which revealed mild pancolitis  Follow-up visit 04/23/2020 Patient reports that her reflux symptoms are under control.  She is currently taking omeprazole 40 mg 2 times a day which is  modestly helping with her reflux. She is on amitriptyline 25 mg at bedtime which is significantly helping her anxiety.  She does not smoke or drink alcohol  NSAIDs: None  Antiplts/Anticoagulants/Anti thrombotics: None  GI Procedures:   EGD and colonoscopy 02/09/2020 - Normal duodenal bulb and second portion of the duodenum. - Normal stomach. Biopsied. - Esophagogastric landmarks identified. - Normal gastroesophageal junction and esophagus. Biopsied.  - Three 3 to 5 mm polyps in the descending colon, removed with a cold snare. Resected and retrieved. - Diverticulosis in the sigmoid colon and in the descending colon. - The distal rectum and anal verge are normal on retroflexion view.  DIAGNOSIS:  A. STOMACH; COLD BIOPSY:  - OXYNTIC MUCOSA WITH FOCAL CHANGES CONSISTENT WITH HEALING MUCOSAL  INJURY.  - CHANGES CONSISTENT WITH PROTON PUMP INHIBITOR EFFECT.  - UNREMARKABLE ANTRAL MUCOSA.  - NEGATIVE FOR H. PYLORI, DYSPLASIA, AND MALIGNANCY.   B. ESOPHAGUS; COLD BIOPSY:  - FRAGMENTS OF UNREMARKABLE SQUAMOUS MUCOSA.  - NEGATIVE FOR INTRAEPITHELIAL EOSINOPHILS, DYSPLASIA, AND MALIGNANCY.   C. COLON POLYPS X3, DESCENDING; COLD SNARE:  - TUBULAR ADENOMA (3).  - NEGATIVE FOR HIGH-GRADE DYSPLASIA AND MALIGNANCY.   Upper endoscopy 12/15/2012 Diagnosis:  Part A: ANTRUM COLD BIOPSY:  - ANTRAL MUCOSA WITH MARKED REACTIVE GASTROPATHY.  - NO ACTIVE INFLAMMATION, ATROPHY, OR INTESTINAL METAPLASIA.  - NEGATIVE FOR HELICOBACTER PYLORI ON DIFF-QUIK STAIN.  [name]  Part B: BODY OF STOMACH COLD BIOPSY:  - OXYNTIC MUCOSA, NO PATHOLOGIC  CHANGES.  .  Part C: GASTROESOPHAGEAL JUNCTION COLD BIOPSY:  - SQUAMOUS EPITHELIUM WITH RARE INTRAEPITHELIAL EOSINOPHILS  COMPATIBLE WITH MINIMAL REFLUX ESOPHAGITIS.  - COLUMNAR-LINED MUCOSA WITH MILD CHRONIC INFLAMMATION.  - NEGATIVE FOR GOBLET CELLS, DYSPLASIA, AND MALIGNANCY.   Colonoscopy 07/22/2012  Pathology revealed tubular adenomas of colon  Past  Medical History:  Diagnosis Date  . Anxiety   . Arthritis   . Breast cancer ([place])   . Breast cancer of upper-outer quadrant of left female breast ([place]) 04/22/2018   16 mm, T1c, N0.  ER/PR positive, HER-2/neu not overexpressed.  Low MammaPrint score.    [name] CAD (coronary artery disease)   . Cancer ([facility])    squamous cell  . Cataract   . Cholecystitis 04/10/2018  . Chronic kidney disease   . Colon cancer screening 2017?   negative cologuard  . Depression   . Family history of adverse reaction to anesthesia    mother had N/V  . Generalized osteoarthritis   . GERD (gastroesophageal reflux disease)   . Gout   . History of endoscopy 11/2012   Gastritis  . Hyperlipidemia   . Hypertension   . IFG (impaired fasting glucose)   . Insomnia   . Menopause   . Morton's neuroma of right foot 11/28/2019  . Osteoporosis   . Parathyroid adenoma   . Personal history of radiation therapy 2020   Left breast  . Personal history of tobacco use, presenting hazards to health 02/02/2016  . PVD (peripheral vascular disease) ([place])   . PVD (peripheral vascular disease) ([place])   . Rotator cuff syndrome of shoulder and allied disorders   . Tobacco abuse     Past Surgical History:  Procedure Laterality Date  . BREAST BIOPSY Left 2014   COMPLEX FIBROADENOMA  . BREAST BIOPSY Left 04/05/2018   [facility]  . BREAST EXCISIONAL BIOPSY Left 1970's   neg  . BREAST FIBROADENOMA SURGERY    . BREAST LUMPECTOMY    . BREAST MAMMOSITE Left 04/2018  . CARDIAC CATHETERIZATION  09/17/2007   Insignificant CAD with 40-50% stenosis of mid LAD.   [name] COLONOSCOPY WITH PROPOFOL N/A 02/09/2020   Procedure: COLONOSCOPY WITH PROPOFOL;  Surgeon: Lin Landsman, MD;  Location[facility] ENDOSCOPY;  Service: Gastroenterology;  Laterality: N/A;  . ESOPHAGOGASTRODUODENOSCOPY (EGD) WITH PROPOFOL N/A 02/09/2020   Procedure: ESOPHAGOGASTRODUODENOSCOPY (EGD) WITH PROPOFOL;  Surgeon: Lin Landsman, MD;  Location: [facility] ENDOSCOPY;  Service:  Gastroenterology;  Laterality: N/A;  . ILIAC ARTERY STENT     right iliac artery stent   . MASTECTOMY, PARTIAL Left 04/22/2018   Procedure: MASTECTOMY PARTIAL;  Surgeon: Robert Bellow, MD;  Location: [facility] ORS;  Service: General;  Laterality: Left;  . ORIF PATELLA Left 03/22/2016   Dr. Tristan Schroeder  . PARATHYROIDECTOMY  2012   [facility]  . SENTINEL NODE BIOPSY Left 04/22/2018   Procedure: SENTINEL NODE BIOPSY;  Surgeon: Robert Bellow, MD;  Location: [facility] ORS;  Service: General;  Laterality: Left;  . SKIN BIOPSY     precancerous    Current Outpatient Medications:  .  acetaminophen (TYLENOL) 650 MG CR tablet, Take 1,950 mg by mouth daily as needed for pain., Disp: , Rfl:  .  amitriptyline (ELAVIL) 25 MG tablet, Take 1 tablet (25 mg total) by mouth at bedtime., Disp: 90 tablet, Rfl: 3 .  amLODipine (NORVASC) 10 MG tablet, Take 1 tablet (10 mg total) by mouth daily., Disp: 90 tablet, Rfl: 1 .  aspirin 81 MG tablet, Take 1 tablet (81  mg total) by mouth daily. (Patient taking differently: Take 81 mg by mouth at bedtime. ), Disp: 90 tablet, Rfl: 3 .  atenolol (TENORMIN) 50 MG tablet, Take 1 tablet (50 mg total) by mouth daily., Disp: 90 tablet, Rfl: 1 .  atorvastatin (LIPITOR) 40 MG tablet, Take 1 tablet (40 mg total) by mouth daily., Disp: 90 tablet, Rfl: 1 .  benazepril (LOTENSIN) 40 MG tablet, Take 1 tablet (40 mg total) by mouth daily., Disp: 90 tablet, Rfl: 1 .  CALCIUM PO, Take by mouth., Disp: , Rfl:  .  clobetasol ointment (TEMOVATE) 0.05 %, Daily at night for 6-12 weeks, if improvement, decrease to 2-3x per week, Disp: 60 g, Rfl: 1 .  clonazePAM (KLONOPIN) 0.5 MG tablet, Take 0.5-1 tablets (0.25-0.5 mg total) by mouth 2 (two) times daily as needed for anxiety., Disp: 30 tablet, Rfl: 0 .  dicyclomine (BENTYL) 10 MG capsule, See admin instructions., Disp: , Rfl:  .  fluticasone (FLONASE) 50 MCG/ACT nasal spray, Place 2 sprays into both nostrils daily., Disp: 48 g, Rfl: 12 .  hydrALAZINE  (APRESOLINE) 25 MG tablet, Take 1 tablet (25 mg total) by mouth 2 (two) times daily., Disp: 180 tablet, Rfl: 1 .  letrozole (FEMARA) 2.5 MG tablet, Take 1 tablet (2.5 mg total) by mouth daily., Disp: 30 tablet, Rfl: 12 .  Loperamide HCl (IMODIUM PO), Take by mouth. , Disp: , Rfl:  .  niacin 500 MG tablet, Take 1,000 mg by mouth at bedtime., Disp: , Rfl:  .  omeprazole (PRILOSEC) 40 MG capsule, Take 1 capsule (40 mg total) by mouth 2 (two) times daily before a meal., Disp: 180 capsule, Rfl: 0 .  Probiotic Product (PROBIOTIC-10 PO), Take by mouth daily., Disp: , Rfl:  .  sertraline (ZOLOFT) 100 MG tablet, Take 1.5 tablets (150 mg total) by mouth daily., Disp: 135 tablet, Rfl: 1 .  tiZANidine (ZANAFLEX) 4 MG tablet, TAKE 1 TABLET(4 MG) BY MOUTH EVERY 6 HOURS AS NEEDED FOR MUSCLE SPASMS, Disp: 30 tablet, Rfl: 3 .  traMADol (ULTRAM) 50 MG tablet, Take 50 mg by mouth every 6 (six) hours., Disp: , Rfl:  .  Turmeric 500 MG TABS, Take 500 mg by mouth daily., Disp: , Rfl:     Current Outpatient Medications:  .  acetaminophen (TYLENOL) 650 MG CR tablet, Take 1,950 mg by mouth daily as needed for pain., Disp: , Rfl:  .  amitriptyline (ELAVIL) 25 MG tablet, Take 1 tablet (25 mg total) by mouth at bedtime., Disp: 90 tablet, Rfl: 3 .  amLODipine (NORVASC) 10 MG tablet, Take 1 tablet (10 mg total) by mouth daily., Disp: 90 tablet, Rfl: 1 .  aspirin 81 MG tablet, Take 1 tablet (81 mg total) by mouth daily. (Patient taking differently: Take 81 mg by mouth at bedtime. ), Disp: 90 tablet, Rfl: 3 .  atenolol (TENORMIN) 50 MG tablet, Take 1 tablet (50 mg total) by mouth daily., Disp: 90 tablet, Rfl: 1 .  atorvastatin (LIPITOR) 40 MG tablet, Take 1 tablet (40 mg total) by mouth daily., Disp: 90 tablet, Rfl: 1 .  benazepril (LOTENSIN) 40 MG tablet, Take 1 tablet (40 mg total) by mouth daily., Disp: 90 tablet, Rfl: 1 .  CALCIUM PO, Take by mouth., Disp: , Rfl:  .  clobetasol ointment (TEMOVATE) 0.05 %, Daily at night  for 6-12 weeks, if improvement, decrease to 2-3x per week, Disp: 60 g, Rfl: 1 .  clonazePAM (KLONOPIN) 0.5 MG tablet, Take 0.5-1 tablets (0.25-0.5 mg total) by  mouth 2 (two) times daily as needed for anxiety., Disp: 30 tablet, Rfl: 0 .  dicyclomine (BENTYL) 10 MG capsule, See admin instructions., Disp: , Rfl:  .  fluticasone (FLONASE) 50 MCG/ACT nasal spray, Place 2 sprays into both nostrils daily., Disp: 48 g, Rfl: 12 .  hydrALAZINE (APRESOLINE) 25 MG tablet, Take 1 tablet (25 mg total) by mouth 2 (two) times daily., Disp: 180 tablet, Rfl: 1 .  letrozole (FEMARA) 2.5 MG tablet, Take 1 tablet (2.5 mg total) by mouth daily., Disp: 30 tablet, Rfl: 12 .  Loperamide HCl (IMODIUM PO), Take by mouth. , Disp: , Rfl:  .  niacin 500 MG tablet, Take 1,000 mg by mouth at bedtime., Disp: , Rfl:  .  omeprazole (PRILOSEC) 40 MG capsule, Take 1 capsule (40 mg total) by mouth 2 (two) times daily before a meal., Disp: 180 capsule, Rfl: 0 .  Probiotic Product (PROBIOTIC-10 PO), Take by mouth daily., Disp: , Rfl:  .  sertraline (ZOLOFT) 100 MG tablet, Take 1.5 tablets (150 mg total) by mouth daily., Disp: 135 tablet, Rfl: 1 .  tiZANidine (ZANAFLEX) 4 MG tablet, TAKE 1 TABLET(4 MG) BY MOUTH EVERY 6 HOURS AS NEEDED FOR MUSCLE SPASMS, Disp: 30 tablet, Rfl: 3 .  traMADol (ULTRAM) 50 MG tablet, Take 50 mg by mouth every 6 (six) hours., Disp: , Rfl:  .  Turmeric 500 MG TABS, Take 500 mg by mouth daily., Disp: , Rfl:    Family History  Problem Relation Age of Onset  . Heart attack Father 53  . Alcohol abuse Father   . Heart disease Father   . Liver disease Father   . Hypertension Father   . Heart disease Sister 55       CAD; s/p stent placement  . Stroke Sister   . AAA (abdominal aortic aneurysm) Sister   . Dementia Sister   . Stroke Mother   . Hypertension Mother   . Diabetes Mother   . Stroke Maternal Grandmother   . Heart attack Maternal Grandfather   . Stroke Paternal Grandmother   . Breast cancer  Maternal Aunt 65  . Breast cancer Paternal Aunt   . Bone cancer Paternal Uncle      Social History   Tobacco Use  . Smoking status: Former Smoker    Packs/day: 1.00    Years: 42.00    Pack years: 42.00    Types: Cigarettes    Quit date: 08/28/2000    Years since quitting: 19.6  . Smokeless tobacco: Never Used  Vaping Use  . Vaping Use: Never used  Substance Use Topics  . Alcohol use: No  . Drug use: No    Allergies as of 04/23/2020 - Review Complete 04/23/2020  Allergen Reaction Noted  . Alendronate Nausea Only and Other (See Comments) 03/24/2015  . Amoxicillin-pot clavulanate Diarrhea 12/02/2019  . Crestor [rosuvastatin] Nausea Only 01/21/2013  . Cyclobenzaprine Other (See Comments) 03/24/2015  . Erythromycin Other (See Comments) 01/21/2013  . Fosamax [alendronate sodium] Nausea And Vomiting 01/21/2013    Review of Systems:    All systems reviewed and negative except where noted in HPI.   Physical Exam:  BP (!) 155/79 (BP Location: Left Arm, Patient Position: Sitting, Cuff Size: Normal)   Pulse 67   Temp 97.8 F (36.6 C) (Oral)   Wt 135 lb 8 oz (61.5 kg)   BMI 27.68 kg/m  No LMP recorded. Patient is postmenopausal.  General:   Alert,  Well-developed, well-nourished, pleasant and cooperative in NAD  Head:  Normocephalic and atraumatic. Eyes:  Sclera clear, no icterus.   Conjunctiva pink. Ears:  Normal auditory acuity. Nose:  No deformity, discharge, or lesions. Mouth:  No deformity or lesions,oropharynx pink & moist. Neck:  Supple; no masses or thyromegaly. Lungs:  Respirations even and unlabored.  Clear throughout to auscultation.   No wheezes, crackles, or rhonchi. No acute distress. Heart:  Regular rate and rhythm; no murmurs, clicks, rubs, or gallops. Abdomen:  Normal bowel sounds. Soft, non-tender and non-distended without masses, hepatosplenomegaly or hernias noted.  No guarding or rebound tenderness.   Rectal: Not performed Msk:  Symmetrical without gross  deformities. Good, equal movement & strength bilaterally. Pulses:  Normal pulses noted. Extremities:  No clubbing or edema.  No cyanosis. Neurologic:  Alert and oriented x3;  grossly normal neurologically. Skin:  Intact without significant lesions or rashes. No jaundice. Psych:  Alert and cooperative. Normal mood and affect.  Imaging Studies: Reviewed  Assessment and Plan:   Marie Parker is a 74 y.o. female with history of anxiety, breast cancer survivor, hypertension, hyperlipidemia, generalized anxiety is seen in consultation for poorly controlled GERD and surveillance colonoscopy  Chronic GERD EGD is unremarkable Continue omeprazole 40 mg daily before breakfast for 1 more month, then decrease to 20 mg daily Continue antireflux lifestyle  Personal history of tubular adenomas of the colon surveillance colonoscopy with pediatric colonoscope in 01/2025  Generalized anxiety, amitriptyline is really working well Likely contributing to worsening of reflux symptoms Continue amitriptyline 25 mg at bedtime in addition to Zoloft   Follow up as needed   Cephas Darby, MD

## 2020-05-04 ENCOUNTER — Other Ambulatory Visit: Payer: Self-pay | Admitting: General Surgery

## 2020-05-04 DIAGNOSIS — Z79811 Long term (current) use of aromatase inhibitors: Secondary | ICD-10-CM

## 2020-05-24 ENCOUNTER — Ambulatory Visit (INDEPENDENT_AMBULATORY_CARE_PROVIDER_SITE_OTHER): Payer: Medicare Other | Admitting: Licensed Clinical Social Worker

## 2020-05-24 DIAGNOSIS — M2041 Other hammer toe(s) (acquired), right foot: Secondary | ICD-10-CM | POA: Diagnosis not present

## 2020-05-24 DIAGNOSIS — E782 Mixed hyperlipidemia: Secondary | ICD-10-CM

## 2020-05-24 DIAGNOSIS — I2510 Atherosclerotic heart disease of native coronary artery without angina pectoris: Secondary | ICD-10-CM

## 2020-05-24 DIAGNOSIS — I739 Peripheral vascular disease, unspecified: Secondary | ICD-10-CM

## 2020-05-24 DIAGNOSIS — G5761 Lesion of plantar nerve, right lower limb: Secondary | ICD-10-CM | POA: Diagnosis not present

## 2020-05-24 DIAGNOSIS — F411 Generalized anxiety disorder: Secondary | ICD-10-CM

## 2020-05-24 DIAGNOSIS — M2011 Hallux valgus (acquired), right foot: Secondary | ICD-10-CM | POA: Diagnosis not present

## 2020-05-24 DIAGNOSIS — Z636 Dependent relative needing care at home: Secondary | ICD-10-CM

## 2020-05-24 DIAGNOSIS — M79671 Pain in right foot: Secondary | ICD-10-CM | POA: Diagnosis not present

## 2020-05-24 NOTE — Chronic Care Management (AMB) (Signed)
Chronic Care Management    Clinical Social Work Follow Up Note  05/24/2020 Name: Marie Parker MRN: [id number] DOB: 1945-10-04  Marie Parker is a 74 y.o. year old female who is a primary care patient of Valerie Roys, DO. The CCM team was consulted for assistance with Mental Health Counseling and Resources.   Review of patient status, including review of consultants reports, other relevant assessments, and collaboration with appropriate care team members and the patient's provider was performed as part of comprehensive patient evaluation and provision of chronic care management services.    SDOH (Social Determinants of Health) assessments performed: Yes    Outpatient Encounter Medications as of 05/24/2020  Medication Sig  . acetaminophen (TYLENOL) 650 MG CR tablet Take 1,950 mg by mouth daily as needed for pain.  [name] amitriptyline (ELAVIL) 25 MG tablet Take 1 tablet (25 mg total) by mouth at bedtime.  [name] amLODipine (NORVASC) 10 MG tablet Take 1 tablet (10 mg total) by mouth daily.  [name] aspirin 81 MG tablet Take 1 tablet (81 mg total) by mouth daily. (Patient taking differently: Take 81 mg by mouth at bedtime. )  . atenolol (TENORMIN) 50 MG tablet Take 1 tablet (50 mg total) by mouth daily.  [name] atorvastatin (LIPITOR) 40 MG tablet Take 1 tablet (40 mg total) by mouth daily.  . benazepril (LOTENSIN) 40 MG tablet Take 1 tablet (40 mg total) by mouth daily.  [name] CALCIUM PO Take by mouth.  . clobetasol ointment (TEMOVATE) 0.05 % Daily at night for 6-12 weeks, if improvement, decrease to 2-3x per week  . clonazePAM (KLONOPIN) 0.5 MG tablet Take 0.5-1 tablets (0.25-0.5 mg total) by mouth 2 (two) times daily as needed for anxiety.  . dicyclomine (BENTYL) 10 MG capsule See admin instructions.  . fluticasone (FLONASE) 50 MCG/ACT nasal spray Place 2 sprays into both nostrils daily.  . hydrALAZINE (APRESOLINE) 25 MG tablet Take 1 tablet (25 mg total) by mouth 2 (two) times daily.  [name] letrozole (FEMARA)  2.5 MG tablet Take 1 tablet (2.5 mg total) by mouth daily.  . Loperamide HCl (IMODIUM PO) Take by mouth.   . niacin 500 MG tablet Take 1,000 mg by mouth at bedtime.  [name] omeprazole (PRILOSEC) 40 MG capsule Take 1 capsule (40 mg total) by mouth 2 (two) times daily before a meal.  . Probiotic Product (PROBIOTIC-10 PO) Take by mouth daily.  . sertraline (ZOLOFT) 100 MG tablet Take 1.5 tablets (150 mg total) by mouth daily.  [name] tiZANidine (ZANAFLEX) 4 MG tablet TAKE 1 TABLET(4 MG) BY MOUTH EVERY 6 HOURS AS NEEDED FOR MUSCLE SPASMS  . traMADol (ULTRAM) 50 MG tablet Take 50 mg by mouth every 6 (six) hours.  . Turmeric 500 MG TABS Take 500 mg by mouth daily.   No facility-administered encounter medications on file as of 05/24/2020.     Goals Addressed    .  SW-I want to improve my self-care and decrease my anxiety (pt-stated)        Current Barriers:  . Financial constraints . Limited social support . Level of care concerns . Mental Health Concerns  . Social Isolation . Limited access to caregiver- in terms of spouse . Lacks knowledge of community resource: available personal care service resources and [facility] resource connection within the area  Clinical Social Work Clinical Goal(s):  [name] Over the next 90 days, client will work with SW to address concerns related to reducing anxiety and improving self-care  Interventions: . Patient interviewed and appropriate assessments  performed . Provided mental health counseling with regard to managing anxiety. Educated patient on coping methods to implement into his daily life to combat depressive symptoms and stress. Patient denied any suicidal or homicidal ideations. Encouraged patient to implement deep breathing exercises into her daily routine due to ongoing anxiety. Patient reports having therapy in the past but denies wanting LCSW to make a referral for counseling. Patient was appreciative of the offer but wishes to manage her care without mental health  therapy at this time. . Patient reports that since her Zoloft medication was increased by 50 mg, she is able to manage her depressive symptoms a lot more effectively. Patient is currently on 150 mg of Zoloft. Patient is also on amitriptyline. Patient reports that both of these medications have helped to alleviate her anxiety and depression.  . Provided patient with information about available personal care service resources within the area. . CSW used motivational interviewing techniques to engage pt in meaningful conversation and to assess pt's wilingess to improve self-care and decrease her caregiver burnout symptoms. CSW explored pt's coping skills and access to community services.  . Patient is agreeable to start implementing deep breathing exercises into her daily routine to combat anxiety. She reports that she is currently doing word puzzles and games to occupy her time and improve her brain functioning. Positive reinforcement provided.  [name]  LCSW provided reflective listening and implemented appropriate interventions to help suppport patient and her emotional needs  . Patient was receptive to emotional support provided during session . Discussed plans with patient for ongoing care management follow up and provided patient with direct contact information for care management team . Advised patient to contact CCM program for any urgent needs. . Assisted patient/caregiver with obtaining information about health plan benefits . Patient reports that she sometimes feels "jittery" which may be a result of her medications but reports that she is able to effectively work through this when it arises. Patient reports "it doesn't last long."  . Provided education to patient/caregiver regarding level of care options. . Patient reports that her right foot has been bothering her and she may need surgery but does not have anyone to help her recover in the home as she is the active caregiver for her spouse. Spouse is  doing better and is able to drive now.  . Patient reports that overall she is doing better and is gaining much needed socialization to combat her anxiety. Patient reports that she is currently out with her aunt and that they plan to go out to eat and get their hair done together. Positive reinforcement provided for self-care and socialization.  . Patient continues to work part time at [employer] where she receives active socialization.   Patient Self Care Activities:  . Attends all scheduled provider appointments . Calls provider office for new concerns or questions  Please see past updates related to this goal by clicking on the "Past Updates" button in the selected goal       Follow Up Plan: SW will follow up with patient by phone over the next quarter  Eula Fried, [place], MSW, [email or web address] Phone: [phone]

## 2020-05-25 ENCOUNTER — Other Ambulatory Visit: Payer: Self-pay | Admitting: Radiation Oncology

## 2020-06-07 DIAGNOSIS — M21621 Bunionette of right foot: Secondary | ICD-10-CM | POA: Diagnosis not present

## 2020-06-07 DIAGNOSIS — G5761 Lesion of plantar nerve, right lower limb: Secondary | ICD-10-CM | POA: Diagnosis not present

## 2020-06-07 DIAGNOSIS — M67471 Ganglion, right ankle and foot: Secondary | ICD-10-CM | POA: Diagnosis not present

## 2020-06-07 DIAGNOSIS — M2011 Hallux valgus (acquired), right foot: Secondary | ICD-10-CM | POA: Diagnosis not present

## 2020-06-07 DIAGNOSIS — M2041 Other hammer toe(s) (acquired), right foot: Secondary | ICD-10-CM | POA: Diagnosis not present

## 2020-06-08 ENCOUNTER — Encounter
Admission: RE | Admit: 2020-06-08 | Discharge: 2020-06-08 | Disposition: A | Discharge status: Home / Self Care | Payer: Medicare Other | Source: Ambulatory Visit | Attending: Podiatry | Admitting: Podiatry

## 2020-06-08 ENCOUNTER — Other Ambulatory Visit: Payer: Self-pay | Admitting: Podiatry

## 2020-06-08 ENCOUNTER — Other Ambulatory Visit: Payer: Self-pay

## 2020-06-08 NOTE — Progress Notes (Signed)
[facility] Perioperative Services: Pre-Admission/Anesthesia Testing   Date: 06/08/20 Name: Sue Fernicola MRN:   [id number]  Re: Consideration of preoperative prophylactic antibiotic change   Request sent to: Samara Deist, DPM (routed and/or faxed via [facility])  Planned Surgical Procedure(s):    Case: [id number] Date/Time: 06/11/20 1002   Procedures:      DOUBLE OSTEOTOMY RIGHT (Right Toe)     EXCISION MORTON'S NEUROMA (Right )     HAMMER TOE CORRECTION T6 (Right Toe)   Anesthesia type: Choice   Pre-op diagnosis:      M79.671 RIGHT FOOT PAIN     M20.11 HALLUX VALGUS RIGHT     M20.41 HAMMERTOE RIGHT     G57.61 LESION RIGHT PLANTAR NERVE   Location: [facility] OR ROOM 02 / [place] ORS FOR ANESTHESIA GROUP   Surgeons: Samara Deist, DPM    Notes: 1. Patient has a documented allergy to PCN   Advising that PCN has caused her to experience diarrhea in the past.  2. Screened as appropriate for cephalosporin use during medication reconciliation . No immediate angioedema, dysphagia, SOB, anaphylaxis symptoms. . No severe rash involving mucous membranes or skin necrosis. . No hospital admissions related to side effects of PCN/cephalosporin use.  . No documented reaction to PCN or cephalosporin in the last 10 years.  Request:  As an evidence based approach to reducing the rate of incidence for post-operative SSI and the development of MDROs, could an agent with narrower coverage for preoperative prophylaxis in this patient's upcoming surgical course be considered?  1. Currently ordered preoperative prophylactic ABX: clindamycin.   2. Specifically requesting change to cephalosporin (CEFAZOLIN).   3. Please communicate decision with me and I will change the orders in Epic as per your direction.   Things to consider:  Many patients report that they were "allergic" to PCN earlier in life, however this does not translate into a true lifelong allergy. Patients can lose  sensitivity to specific IgE antibodies over time if PCN is avoided (Kleris & Lugar, 2019).   Up to 10% of the adult population and 15% of hospitalized patients report an allergy to PCN, however clinical studies suggest that 90% of those reporting an allergy can tolerate PCN antibiotics (Kleris & Lugar, 2019).   Cross-sensitivity between PCN and cephalosporins has been documented as being as high as 10%, however this estimation included data believed to have been collected in a setting where there was contamination. Newer data suggests that the prevalence of cross-sensitivity between PCN and cephalosporins is actually estimated to be closer to 1% (Hermanides et al., 2018).    Patients labeled as PCN allergic, whether they are truly allergic or not, have been found to have inferior outcomes in terms of rates of serious infection, and these patients tend to have longer hospital stays ([place], 2019).   Treatment related secondary infections, such as Clostridioides difficile, have been linked to the improper use of broad spectrum antibiotics in patients improperly labeled as PCN allergic (Kleris & Lugar, 2019).   Anaphylaxis from cephalosporins is rare and the evidence suggests that there is no increased risk of an anaphylactic type reaction when cephalosporins are used in a PCN allergic patient (Pichichero, 2006).  Citations: Hermanides J, Lemkes BA, Prins Pearla Dubonnet MW, Terreehorst I. Presumed ?-Lactam Allergy and Cross-reactivity in the Operating Theater: A Practical Approach. Anesthesiology. 2018 Aug;129(2):335-342. doi: 10.1097/ALN.0000000000002252. PMID: 85027741.  Kleris, Vinings., & Lugar, P. L. (2019). Things We Do For No Reason: Failing to Question a  Penicillin Allergy History. Journal of hospital medicine, 14(10), 7258361511. Advance online publication. https://www.wallace-m[place]leton.info/  Pichichero, M. E. (2006). Cephalosporins can be prescribed safely for penicillin-allergic patients.  Journal of family medicine, 55(2), 106-112. Accessed: https://cdn.mdedge.com/files/s40fs-public/Document/September-2017/5502JFP_[email or web address].pdf   Honor Loh, MSN, APRN, FNP-C, CEN [facility]  Peri-operative Services Nurse Practitioner 06/08/20 12:29 PM

## 2020-06-08 NOTE — Patient Instructions (Signed)
Your procedure is scheduled on: 06/11/20 Report to [facility]. To find out your arrival time please call [phone] between 1PM - 3PM on 09/11/19.  Remember: Instructions that are not followed completely may result in serious medical risk, up to and including death, or upon the discretion of your surgeon and anesthesiologist your surgery may need to be rescheduled.     _X__ 1. Do not eat food after midnight the night before your procedure.                 No gum chewing or hard candies. You may drink clear liquids up to 2 hours                 before you are scheduled to arrive for your surgery- DO not drink clear                 liquids within 2 hours of the start of your surgery.                 Clear Liquids include:  water, apple juice without pulp, clear carbohydrate                 drink such as Clearfast or Gatorade, Black Coffee or Tea (Do not add                 anything to coffee or tea). Diabetics water only  __X__2.  On the morning of surgery brush your teeth with toothpaste and water, you                 may rinse your mouth with mouthwash if you wish.  Do not swallow any              toothpaste of mouthwash.     _X__ 3.  No Alcohol for 24 hours before or after surgery.   _X__ 4.  Do Not Smoke or use e-cigarettes For 24 Hours Prior to Your Surgery.                 Do not use any chewable tobacco products for at least 6 hours prior to                 surgery.  ____  5.  Bring all medications with you on the day of surgery if instructed.   __X__  6.  Notify your doctor if there is any change in your medical condition      (cold, fever, infections).     Do not wear jewelry, make-up, hairpins, clips or nail polish. Do not wear lotions, powders, or perfumes.  Do not shave 48 hours prior to surgery. Men may shave face and neck. Do not bring valuables to the hospital.    [facility] is not responsible for any belongings  or valuables.  Contacts, dentures/partials or body piercings may not be worn into surgery. Bring a case for your contacts, glasses or hearing aids, a denture cup will be supplied. Leave your suitcase in the car. After surgery it may be brought to your room. For patients admitted to the hospital, discharge time is determined by your treatment team.   Patients discharged the day of surgery will not be allowed to drive home.   Please read over the following fact sheets that you were given:   MRSA Information  __X__ Take these medicines the morning of surgery with A SIP OF WATER:  1. amLODipine (NORVASC) 10 MG tablet  2. atenolol (TENORMIN) 50 MG tablet  3. hydrALAZINE (APRESOLINE) 25 MG tablet  4. omeprazole (PRILOSEC) 40 MG capsule  5. sertraline (ZOLOFT) 100 MG tablet  6.  ____ Fleet Enema (as directed)   __X__ Use CHG Soap/SAGE wipes as directed  ____ Use inhalers on the day of surgery  ____ Stop metformin/Janumet/Farxiga 2 days prior to surgery    ____ Take 1/2 of usual insulin dose the night before surgery. No insulin the morning          of surgery.   ____ Stop Blood Thinners Coumadin/Plavix/Xarelto/Pleta/Pradaxa/Eliquis/Effient/Aspirin  on   Or contact your Surgeon, Cardiologist or Medical Doctor regarding  ability to stop your blood thinners  __X__ Stop Anti-inflammatories 7 days before surgery such as Advil, Ibuprofen, Motrin,  BC or Goodies Powder, Naprosyn, Naproxen, Aleve, Aspirin    __X__ Stop all herbal supplements, fish oil or vitamin E until after surgery. STOP TURMERIC TODAY 06/08/20   ____ Bring C-Pap to the hospital.      How to Use Chlorhexidine for Bathing Chlorhexidine gluconate (CHG) is a germ-killing (antiseptic) solution that is used to clean the skin. It can get rid of the bacteria that normally live on the skin and can keep them away for about 24 hours. To clean your skin with CHG, you may be given:  A CHG solution to use in the shower or as  part of a sponge bath.  A prepackaged cloth that contains CHG. Cleaning your skin with CHG may help lower the risk for infection:  While you are staying in the intensive care unit of the hospital.  If you have a vascular access, such as a central line, to provide short-term or long-term access to your veins.  If you have a catheter to drain urine from your bladder.  If you are on a ventilator. A ventilator is a machine that helps you breathe by moving air in and out of your lungs.  After surgery. What are the risks? Risks of using CHG include:  A skin reaction.  Hearing loss, if CHG gets in your ears.  Eye injury, if CHG gets in your eyes and is not rinsed out.  The CHG product catching fire. Make sure that you avoid smoking and flames after applying CHG to your skin. Do not use CHG:  If you have a chlorhexidine allergy or have previously reacted to chlorhexidine.  On babies younger than 53 months of age. How to use CHG solution  Use CHG only as told by your health care provider, and follow the instructions on the label.  Use the full amount of CHG as directed. Usually, this is one bottle. During a shower Follow these steps when using CHG solution during a shower (unless your health care provider gives you different instructions): 1. Start the shower. 2. Use your normal soap and shampoo to wash your face and hair. 3. Turn off the shower or move out of the shower stream. 4. Pour the CHG onto a clean washcloth. Do not use any type of brush or rough-edged sponge. 5. Starting at your neck, lather your body down to your toes. Make sure you follow these instructions: ? If you will be having surgery, pay special attention to the part of your body where you will be having surgery. Scrub this area for at least 1 minute. ? Do not use CHG on your head or face. If the solution gets into your ears or eyes, rinse them  well with water. ? Avoid your genital area. ? Avoid any areas of skin  that have broken skin, cuts, or scrapes. ? Scrub your back and under your arms. Make sure to wash skin folds. 6. Let the lather sit on your skin for 1-2 minutes or as long as told by your health care provider. 7. Thoroughly rinse your entire body in the shower. Make sure that all body creases and crevices are rinsed well. 8. Dry off with a clean towel. Do not put any substances on your body afterward--such as powder, lotion, or perfume--unless you are told to do so by your health care provider. Only use lotions that are recommended by the manufacturer. 9. Put on clean clothes or pajamas. 10. If it is the night before your surgery, sleep in clean sheets.  During a sponge bath Follow these steps when using CHG solution during a sponge bath (unless your health care provider gives you different instructions): 1. Use your normal soap and shampoo to wash your face and hair. 2. Pour the CHG onto a clean washcloth. 3. Starting at your neck, lather your body down to your toes. Make sure you follow these instructions: ? If you will be having surgery, pay special attention to the part of your body where you will be having surgery. Scrub this area for at least 1 minute. ? Do not use CHG on your head or face. If the solution gets into your ears or eyes, rinse them well with water. ? Avoid your genital area. ? Avoid any areas of skin that have broken skin, cuts, or scrapes. ? Scrub your back and under your arms. Make sure to wash skin folds. 4. Let the lather sit on your skin for 1-2 minutes or as long as told by your health care provider. 5. Using a different clean, wet washcloth, thoroughly rinse your entire body. Make sure that all body creases and crevices are rinsed well. 6. Dry off with a clean towel. Do not put any substances on your body afterward--such as powder, lotion, or perfume--unless you are told to do so by your health care provider. Only use lotions that are recommended by the  manufacturer. 7. Put on clean clothes or pajamas. 8. If it is the night before your surgery, sleep in clean sheets. How to use CHG prepackaged cloths  Only use CHG cloths as told by your health care provider, and follow the instructions on the label.  Use the CHG cloth on clean, dry skin.  Do not use the CHG cloth on your head or face unless your health care provider tells you to.  When washing with the CHG cloth: ? Avoid your genital area. ? Avoid any areas of skin that have broken skin, cuts, or scrapes. Before surgery Follow these steps when using a CHG cloth to clean before surgery (unless your health care provider gives you different instructions): 1. Using the CHG cloth, vigorously scrub the part of your body where you will be having surgery. Scrub using a back-and-forth motion for 3 minutes. The area on your body should be completely wet with CHG when you are done scrubbing. 2. Do not rinse. Discard the cloth and let the area air-dry. Do not put any substances on the area afterward, such as powder, lotion, or perfume. 3. Put on clean clothes or pajamas. 4. If it is the night before your surgery, sleep in clean sheets.  For general bathing Follow these steps when using CHG cloths for general bathing (unless  your health care provider gives you different instructions). 1. Use a separate CHG cloth for each area of your body. Make sure you wash between any folds of skin and between your fingers and toes. Wash your body in the following order, switching to a new cloth after each step: ? The front of your neck, shoulders, and chest. ? Both of your arms, under your arms, and your hands. ? Your stomach and groin area, avoiding the genitals. ? Your right leg and foot. ? Your left leg and foot. ? The back of your neck, your back, and your buttocks. 2. Do not rinse. Discard the cloth and let the area air-dry. Do not put any substances on your body afterward--such as powder, lotion, or  perfume--unless you are told to do so by your health care provider. Only use lotions that are recommended by the manufacturer. 3. Put on clean clothes or pajamas. Contact a health care provider if:  Your skin gets irritated after scrubbing.  You have questions about using your solution or cloth. Get help right away if:  Your eyes become very red or swollen.  Your eyes itch badly.  Your skin itches badly and is red or swollen.  Your hearing changes.  You have trouble seeing.  You have swelling or tingling in your mouth or throat.  You have trouble breathing.  You swallow any chlorhexidine. Summary  Chlorhexidine gluconate (CHG) is a germ-killing (antiseptic) solution that is used to clean the skin. Cleaning your skin with CHG may help to lower your risk for infection.  You may be given CHG to use for bathing. It may be in a bottle or in a prepackaged cloth to use on your skin. Carefully follow your health care provider's instructions and the instructions on the product label.  Do not use CHG if you have a chlorhexidine allergy.  Contact your health care provider if your skin gets irritated after scrubbing. This information is not intended to replace advice given to you by your health care provider. Make sure you discuss any questions you have with your health care provider. Document Revised: 10/31/2018 Document Reviewed: 07/12/2017 Elsevier Patient Education  [place].    How to Use an Incentive Spirometer An incentive spirometer is a tool that measures how well you are filling your lungs with each breath. Learning to take long, deep breaths using this tool can help you keep your lungs clear and active. This may help to reverse or lessen your chance of developing breathing (pulmonary) problems, especially infection. You may be asked to use a spirometer:  After a surgery.  If you have a lung problem or a history of smoking.  After a long period of time when you  have been unable to move or be active. If the spirometer includes an indicator to show the highest number that you have reached, your health care provider or respiratory therapist will help you set a goal. Keep a list (log) of your progress as told by your health care provider. What are the risks?  Breathing too quickly may cause dizziness or cause you to pass out. Take your time so you do not get dizzy or light-headed.  If you are in pain, you may need to take pain medicine before doing incentive spirometry. It is harder to take a deep breath if you are having pain. How to use your incentive spirometer  1. Sit up on the edge of your bed or on a chair. 2. Hold the incentive  spirometer so that it is in an upright position. 3. Before you use the spirometer, breathe out normally. 4. Place the mouthpiece in your mouth. Make sure your lips are closed tightly around it. 5. Breathe in slowly and as deeply as you can through your mouth, causing the piston or the ball to rise toward the top of the chamber. 6. Hold your breath for 3-5 seconds, or for as long as possible. ? If the spirometer includes a coach indicator, use this to guide you in breathing. Slow down your breathing if the indicator goes above the marked areas. 7. Remove the mouthpiece from your mouth and breathe out normally. The piston or ball will return to the bottom of the chamber. 8. Rest for a few seconds, then repeat the steps 10 or more times. ? Take your time and take a few normal breaths between deep breaths so that you do not get dizzy or light-headed. ? Do this every 1-2 hours when you are awake. 9. If the spirometer includes a goal marker to show the highest number you have reached (best effort), use this as a goal to work toward during each repetition. 10. After each set of 10 deep breaths, cough a few times. This will help to make sure that your lungs are clear. ? If you have an incision on your chest or abdomen from surgery,  place a pillow or a rolled-up towel firmly against the incision when you cough. This can help to reduce pain from coughing. General tips  When you become able to get out of bed, walk around often and continue to cough to help clear your lungs.  Keep using the incentive spirometer until your health care provider says it is okay to stop using it. If you have been in the hospital, you may be told to keep using the spirometer at home. Contact a health care provider if:  You are having difficulty using the spirometer.  You have trouble using the spirometer as often as instructed.  Your pain medicine is not giving enough relief for you to use the spirometer as told.  You have a fever.  You develop shortness of breath. Get help right away if:  You develop a cough with bloody mucus from the lungs (bloody sputum).  You have fluid or blood coming from an incision site after you cough. Summary  An incentive spirometer is a tool that can help you learn to take long, deep breaths to keep your lungs clear and active.  You may be asked to use a spirometer after a surgery, if you have a lung problem or a history of smoking, or if you have been inactive for a long period of time.  Use your incentive spirometer as instructed every 1-2 hours while you are awake.  If you have an incision on your chest or abdomen, place a pillow or a rolled-up towel firmly against your incision when you cough. This will help to reduce pain. This information is not intended to replace advice given to you by your health care provider. Make sure you discuss any questions you have with your health care provider. Document Revised: 03/14/2019 Document Reviewed: 06/27/2017 Elsevier Patient Education  2020 [employer].

## 2020-06-09 ENCOUNTER — Telehealth: Payer: Self-pay | Admitting: General Practice

## 2020-06-09 ENCOUNTER — Ambulatory Visit (INDEPENDENT_AMBULATORY_CARE_PROVIDER_SITE_OTHER): Payer: Medicare Other | Admitting: General Practice

## 2020-06-09 ENCOUNTER — Other Ambulatory Visit
Admission: RE | Admit: 2020-06-09 | Discharge: 2020-06-09 | Disposition: A | Discharge status: Home / Self Care | Payer: Medicare Other | Source: Ambulatory Visit | Attending: Podiatry | Admitting: Podiatry

## 2020-06-09 DIAGNOSIS — I2510 Atherosclerotic heart disease of native coronary artery without angina pectoris: Secondary | ICD-10-CM | POA: Diagnosis not present

## 2020-06-09 DIAGNOSIS — Z636 Dependent relative needing care at home: Secondary | ICD-10-CM

## 2020-06-09 DIAGNOSIS — Z01812 Encounter for preprocedural laboratory examination: Secondary | ICD-10-CM | POA: Insufficient documentation

## 2020-06-09 DIAGNOSIS — F419 Anxiety disorder, unspecified: Secondary | ICD-10-CM

## 2020-06-09 DIAGNOSIS — E782 Mixed hyperlipidemia: Secondary | ICD-10-CM | POA: Diagnosis not present

## 2020-06-09 DIAGNOSIS — M2042 Other hammer toe(s) (acquired), left foot: Secondary | ICD-10-CM

## 2020-06-09 DIAGNOSIS — F411 Generalized anxiety disorder: Secondary | ICD-10-CM

## 2020-06-09 DIAGNOSIS — Z20822 Contact with and (suspected) exposure to covid-19: Secondary | ICD-10-CM | POA: Diagnosis not present

## 2020-06-09 DIAGNOSIS — M21619 Bunion of unspecified foot: Secondary | ICD-10-CM

## 2020-06-09 DIAGNOSIS — M2041 Other hammer toe(s) (acquired), right foot: Secondary | ICD-10-CM

## 2020-06-09 NOTE — Chronic Care Management (AMB) (Signed)
Chronic Care Management   Follow Up Note   06/09/2020 Name: Keniesha Adderly MRN: [id number] DOB: 1945-11-26  Referred by: Valerie Roys, DO Reason for referral : Chronic Care Management (RNCM Follow up Chronic Disease Management and Care Coordination Needs)   Ilona Colley is a 74 y.o. year old female who is a primary care patient of Valerie Roys, DO. The CCM team was consulted for assistance with chronic disease management and care coordination needs.    Review of patient status, including review of consultants reports, relevant laboratory and other test results, and collaboration with appropriate care team members and the patient's provider was performed as part of comprehensive patient evaluation and provision of chronic care management services.    SDOH (Social Determinants of Health) assessments performed: Yes See Care Plan activities for detailed interventions related to [facility])     Outpatient Encounter Medications as of 06/09/2020  Medication Sig   acetaminophen (TYLENOL) 650 MG CR tablet Take 1,300 mg by mouth daily as needed for pain.    amitriptyline (ELAVIL) 25 MG tablet Take 1 tablet (25 mg total) by mouth at bedtime.   amLODipine (NORVASC) 10 MG tablet Take 1 tablet (10 mg total) by mouth daily.   aspirin 81 MG tablet Take 1 tablet (81 mg total) by mouth daily. (Patient taking differently: Take 81 mg by mouth at bedtime. )   atenolol (TENORMIN) 50 MG tablet Take 1 tablet (50 mg total) by mouth daily.   atorvastatin (LIPITOR) 40 MG tablet Take 1 tablet (40 mg total) by mouth daily. (Patient taking differently: Take 40 mg by mouth at bedtime. )   benazepril (LOTENSIN) 40 MG tablet Take 1 tablet (40 mg total) by mouth daily.   CALCIUM-VITAMIN D PO Take 2 tablets by mouth daily.   clonazePAM (KLONOPIN) 0.5 MG tablet Take 0.5-1 tablets (0.25-0.5 mg total) by mouth 2 (two) times daily as needed for anxiety.   dicyclomine (BENTYL) 10 MG capsule Take 10 mg by  mouth daily as needed for spasms.    fluticasone (FLONASE) 50 MCG/ACT nasal spray Place 2 sprays into both nostrils daily. (Patient taking differently: Place 2 sprays into both nostrils daily as needed for allergies. )   hydrALAZINE (APRESOLINE) 25 MG tablet Take 1 tablet (25 mg total) by mouth 2 (two) times daily.   letrozole (FEMARA) 2.5 MG tablet TAKE 1 TABLET(2.5 MG) BY MOUTH DAILY (Patient taking differently: Take 2.5 mg by mouth daily. )   Loperamide HCl (IMODIUM PO) Take by mouth.  (Patient not taking: Reported on 06/02/2020)   Multiple Vitamin (MULTIVITAMIN WITH MINERALS) TABS tablet Take 1 tablet by mouth daily.   niacin 500 MG tablet Take 1,000 mg by mouth at bedtime.   omeprazole (PRILOSEC) 40 MG capsule Take 1 capsule (40 mg total) by mouth 2 (two) times daily before a meal.   Probiotic Product (PROBIOTIC-10 PO) Take by mouth daily.    sertraline (ZOLOFT) 100 MG tablet Take 1.5 tablets (150 mg total) by mouth daily.   tiZANidine (ZANAFLEX) 4 MG tablet TAKE 1 TABLET(4 MG) BY MOUTH EVERY 6 HOURS AS NEEDED FOR MUSCLE SPASMS (Patient not taking: Reported on 06/02/2020)   traMADol (ULTRAM) 50 MG tablet Take 50 mg by mouth every 6 (six) hours as needed for moderate pain.    Turmeric 500 MG TABS Take 500 mg by mouth daily.    No facility-administered encounter medications on file as of 06/09/2020.     Objective:  BP Readings from Last 3 Encounters:  04/23/20 [name])  155/79  04/22/20 (!) 143/82  04/05/20 123/81    Goals Addressed              This Visit's Progress     RNCM- I am working towards decreasing my anxiety (pt-stated)        Current Barriers:   Knowledge Deficits related to caregiver strain and reducing daily anxiety  Lacks caregiver support.   Nurse Case Manager Clinical Goal(s):   Over the next 120 days, patient will work with [facility] to address needs related to reducing caregiver strain.  04-07-2020: The patients husband at this time is stable and the  patients anxiety is better.  Still working with the patient on her anxiety level and maintaining a healthy mental status.  Interventions:   Evaluation of current treatment plan related to anxiety/stress and patient's adherence to plan as established by provider. The patient has had an increase in her zoloft and her GI doctor started her on Amitriptyline.  The Amitriptyline had been working well. She had to take a half of a Klonopin today because she had gotten so worked up.  She stated that the GI doctor did not want her to use the Klonopin but she does not use it unless she absolutely has to do so.  The patient states she thinks a lot of it has to do with thinking in her mind that she has cancer and the colonoscopy it going to reveal this. Empathetic listening. 06-09-2020: The patient is happy to report that she feels her anxiety and depression are under good control. Has upcoming surgery on 06-10-2020 on her foot and is hopeful that she will not have any new issues with her depression and anxiety due to not being as mobile for a couple of months.   Advised patient to think about incorporating exercise into her daily routine, a healthy diet, and taking breaks and time for herself. 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 Discussed mindfulness exercises, writing in a journal, crafts, listening to music, taking time for self, reflection. The patient has a card game on her phone she plays and likes to color with an app she has on her phone. 06-09-2020: Reflective listening as the patient recounted the past year and the many things she has dealt with personally.  Praised the patient for being proactive in her care and utilization of resources to help her. She is thankful that her husband is doing so well. She told him now was the time for her to take care of him while she was down for a couple of months.   Reviewed medications with patient  and discussed increase in zoloft has been very helpful.  The patient saw pcp recently for anxiety and her zoloft has been increased. 06-09-2020: the medication changes have been effective in managing her anxiety and depression. The patient expressed positive thoughts and hopefulness on today's call.   Discussed plans with patient for ongoing care management follow up and provided patient with direct contact information for care management team  Reviewed with patient how she is handling stress/anxiety. Patient stating she has decreased some of her outside activities to assist her spouse more which has relieved some of her time commitments. The patient states that her husband is doing better right now. She is not having do do daily infusions like she was and that has helped a lot. The patient is having to do a daily  medication for him but she states it is nothing like before.  Education and support given. The patient talked about her strong faith.  06-09-2020: The patient is happy today and feels like she is in a good place. Looking forward to having her foot surgery to correct some problems she has been having with her right foot. Feels optimistic. Emotional support provided.   Patient Self Care Activities:   Currently UNABLE TO independently manage stress and anxiety, self reported  Performs ADL's independently  Performs IADL's independently  Please see past updates related to this goal by clicking on the "Past Updates" button in the selected goal        RNCM: Pt- "I have not been taking my blood pressure" (pt-stated)        CARE PLAN ENTRY (see longtitudinal plan of care for additional care plan information)  Current Barriers:   Chronic Disease Management support, education, and care coordination needs related to CAD, HTN, HLD, and chronic diarrhea  Clinical Goal(s) related to CAD, HTN, HLD, and Chronic Diarrhea :  Over the next 120 days, patient will:   Work with the care management  team to address educational, disease management, and care coordination needs   Begin or continue self health monitoring activities as directed today Measure and record blood pressure 2 times per week and adhere to a heart healthy diet  Call provider office for new or worsened signs and symptoms Blood pressure findings outside established parameters and New or worsened symptom related to CAD/HLD/Diarrhea and other chronic conditions  Call care management team with questions or concerns  Verbalize basic understanding of patient centered plan of care established today  Interventions related to CAD, HTN, HLD, and Chronic Diarrhea :   Evaluation of current treatment plans and patient's adherence to plan as established by provider.  06-09-2020: the patient is doing well. Denies any issues with CAD, HTN, HLD, diarrhea or other chronic conditions. Is having surgery this week on her right foot to correct several issues. She is hopeful her surgery will go well.   Assessed patient understanding of disease states.  The patient verbalized understanding of chronic conditions. The patient encouraged to step back and do some self care to help with her health and well being. 06-09-2020: The patient is managing her care and doing well. Positive response to medications and changes in life circumstances.   Assessed patient's education and care coordination needs.  Education and support on ways to decrease stress in her life and take care of self while she is also the primary caregiver of her husband. 06-09-2020: Encouraged the patient to continue to work on ways to decrease her anxiety and stress especially with upcoming foot surgery. She is thankful she will have her daughters to help as needed and her granddaughter is excited about helping her after her surgery also.  She is still working her part time job but will be out for a couple of months after her surgery.  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 She is hopeful she will be able to return after she recovers from her surgery as this helps her a lot with socialization.    Provided disease specific education to patient. Education on the benefits of taking blood pressures a couple of times a week. Blood pressure readings have been elevated recently.  Education on the risk of elevated blood pressure of heart attack and stroke. Encouraged the patient to really focus on  self care and finding balance in life. 06-09-2020: Her blood pressures have stablized and she is doing well. Will continue to monitor for changes.   Evaluation of dietary habits: The patient states she is adhering to a heart healthy diet. She feels her health has stabilized out at this time.   Collaborated with appropriate clinical care team members regarding patient needs  Review of upcoming appointments. The patient does not see the pcp again until 09-30-2019 at 0820 am  Patient Self Care Activities related to CAD, HTN, HLD, and Chronic Diarrhea :   Patient is unable to independently self-manage chronic health conditions  Please see past updates related to this goal by clicking on the "Past Updates" button in the selected goal          Plan:   Telephone follow up appointment with care management team member scheduled for: 08-17-2020 at [place] am   [place], MSN, [facility] Mobile: [phone]

## 2020-06-09 NOTE — Patient Instructions (Signed)
Visit Information  Goals Addressed              This Visit's Progress   .  RNCM- I am working towards decreasing my anxiety (pt-stated)        Current Barriers:  [name] Knowledge Deficits related to caregiver strain and reducing daily anxiety . Lacks caregiver support.   Nurse Case Manager Clinical Goal(s):  [name] Over the next 120 days, patient will work with [facility] to address needs related to reducing caregiver strain.  04-07-2020: The patients husband at this time is stable and the patients anxiety is better.  Still working with the patient on her anxiety level and maintaining a healthy mental status.  Interventions:  . Evaluation of current treatment plan related to anxiety/stress and patient's adherence to plan as established by provider. The patient has had an increase in her zoloft and her GI doctor started her on Amitriptyline.  The Amitriptyline had been working well. She had to take a half of a Klonopin today because she had gotten so worked up.  She stated that the GI doctor did not want her to use the Klonopin but she does not use it unless she absolutely has to do so.  The patient states she thinks a lot of it has to do with thinking in her mind that she has cancer and the colonoscopy it going to reveal this. Empathetic listening. 06-09-2020: The patient is happy to report that she feels her anxiety and depression are under good control. Has upcoming surgery on 06-10-2020 on her foot and is hopeful that she will not have any new issues with her depression and anxiety due to not being as mobile for a couple of months.  . Advised patient to think about incorporating exercise into her daily routine, a healthy diet, and taking breaks and time for herself. 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 Discussed mindfulness exercises, writing in a journal, crafts, listening to music, taking time for self, reflection. The patient has a  card game on her phone she plays and likes to color with an app she has on her phone. 06-09-2020: Reflective listening as the patient recounted the past year and the many things she has dealt with personally.  Praised the patient for being proactive in her care and utilization of resources to help her. She is thankful that her husband is doing so well. She told him now was the time for her to take care of him while she was down for a couple of months.  . Reviewed medications with patient and discussed increase in zoloft has been very helpful.  The patient saw pcp recently for anxiety and her zoloft has been increased. 06-09-2020: the medication changes have been effective in managing her anxiety and depression. The patient expressed positive thoughts and hopefulness on today's call.  . Discussed plans with patient for ongoing care management follow up and provided patient with direct contact information for care management team . Reviewed with patient how she is handling stress/anxiety. Patient stating she has decreased some of her outside activities to assist her spouse more which has relieved some of her time commitments. The patient states that her husband is doing better right now. She is not having do do daily infusions like she was and that has helped a lot. The patient is having to do a daily medication for him but she states it is nothing  like before.  Education and support given. The patient talked about her strong faith.  06-09-2020: The patient is happy today and feels like she is in a good place. Looking forward to having her foot surgery to correct some problems she has been having with her right foot. Feels optimistic. Emotional support provided.   Patient Self Care Activities:  . Currently UNABLE TO independently manage stress and anxiety, self reported . Performs ADL's independently . Performs IADL's independently  Please see past updates related to this goal by clicking on the "Past  Updates" button in the selected goal      .  RNCM: Pt- "I have not been taking my blood pressure" (pt-stated)        CARE PLAN ENTRY (see longtitudinal plan of care for additional care plan information)  Current Barriers:  . Chronic Disease Management support, education, and care coordination needs related to CAD, HTN, HLD, and chronic diarrhea  Clinical Goal(s) related to CAD, HTN, HLD, and Chronic Diarrhea :  Over the next 120 days, patient will:  . Work with the care management team to address educational, disease management, and care coordination needs  . Begin or continue self health monitoring activities as directed today Measure and record blood pressure 2 times per week and adhere to a heart healthy diet . Call provider office for new or worsened signs and symptoms Blood pressure findings outside established parameters and New or worsened symptom related to CAD/HLD/Diarrhea and other chronic conditions . Call care management team with questions or concerns . Verbalize basic understanding of patient centered plan of care established today  Interventions related to CAD, HTN, HLD, and Chronic Diarrhea :  . Evaluation of current treatment plans and patient's adherence to plan as established by provider.  06-09-2020: the patient is doing well. Denies any issues with CAD, HTN, HLD, diarrhea or other chronic conditions. Is having surgery this week on her right foot to correct several issues. She is hopeful her surgery will go well.  . Assessed patient understanding of disease states.  The patient verbalized understanding of chronic conditions. The patient encouraged to step back and do some self care to help with her health and well being. 06-09-2020: The patient is managing her care and doing well. Positive response to medications and changes in life circumstances.  . Assessed patient's education and care coordination needs.  Education and support on ways to decrease stress in her life and  take care of self while she is also the primary caregiver of her husband. 06-09-2020: Encouraged the patient to continue to work on ways to decrease her anxiety and stress especially with upcoming foot surgery. She is thankful she will have her daughters to help as needed and her granddaughter is excited about helping her after her surgery also.  She is still working her part time job but will be out for a couple of months after her surgery.  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 She is hopeful she will be able to return after she recovers from her surgery as this helps her a lot with socialization.   . Provided disease specific education to patient. Education on the benefits of taking blood pressures a couple of times a week. Blood pressure readings have been elevated recently.  Education on the risk of elevated blood pressure of heart attack and stroke. Encouraged the patient to really focus on self care and finding balance in life. 06-09-2020: Her  blood pressures have stablized and she is doing well. Will continue to monitor for changes.  . Evaluation of dietary habits: The patient states she is adhering to a heart healthy diet. She feels her health has stabilized out at this time.  Nash Dimmer with appropriate clinical care team members regarding patient needs . Review of upcoming appointments. The patient does not see the pcp again until 09-30-2019 at 0820 am  Patient Self Care Activities related to CAD, HTN, HLD, and Chronic Diarrhea :  . Patient is unable to independently self-manage chronic health conditions  Please see past updates related to this goal by clicking on the "Past Updates" button in the selected goal         Patient verbalizes understanding of instructions provided today.   Telephone follow up appointment with care management team member scheduled for: 08-17-2020 at [place] am  Noreene Larsson RN, MSN, [facility] Mobile: [phone]

## 2020-06-10 LAB — SARS CORONAVIRUS 2 (TAT 6-24 HRS): SARS Coronavirus 2: NEGATIVE

## 2020-06-11 ENCOUNTER — Encounter: Payer: Self-pay | Admitting: Podiatry

## 2020-06-11 ENCOUNTER — Ambulatory Visit
Admission: RE | Admit: 2020-06-11 | Discharge: 2020-06-11 | Disposition: A | Discharge status: Home / Self Care | Payer: Medicare Other | Attending: Podiatry | Admitting: Podiatry

## 2020-06-11 ENCOUNTER — Ambulatory Visit: Payer: Medicare Other | Admitting: Certified Registered Nurse Anesthetist

## 2020-06-11 ENCOUNTER — Ambulatory Visit: Payer: Medicare Other

## 2020-06-11 ENCOUNTER — Other Ambulatory Visit: Payer: Self-pay

## 2020-06-11 ENCOUNTER — Encounter
Admission: RE | Disposition: A | Discharge status: Home / Self Care | Payer: Self-pay | Source: Home / Self Care | Attending: Podiatry

## 2020-06-11 DIAGNOSIS — Z87891 Personal history of nicotine dependence: Secondary | ICD-10-CM | POA: Diagnosis not present

## 2020-06-11 DIAGNOSIS — F419 Anxiety disorder, unspecified: Secondary | ICD-10-CM | POA: Diagnosis not present

## 2020-06-11 DIAGNOSIS — G5761 Lesion of plantar nerve, right lower limb: Secondary | ICD-10-CM | POA: Diagnosis not present

## 2020-06-11 DIAGNOSIS — M2041 Other hammer toe(s) (acquired), right foot: Secondary | ICD-10-CM | POA: Insufficient documentation

## 2020-06-11 DIAGNOSIS — M810 Age-related osteoporosis without current pathological fracture: Secondary | ICD-10-CM | POA: Diagnosis not present

## 2020-06-11 DIAGNOSIS — Z79811 Long term (current) use of aromatase inhibitors: Secondary | ICD-10-CM | POA: Insufficient documentation

## 2020-06-11 DIAGNOSIS — N189 Chronic kidney disease, unspecified: Secondary | ICD-10-CM | POA: Diagnosis not present

## 2020-06-11 DIAGNOSIS — M205X1 Other deformities of toe(s) (acquired), right foot: Secondary | ICD-10-CM | POA: Diagnosis not present

## 2020-06-11 DIAGNOSIS — Z79899 Other long term (current) drug therapy: Secondary | ICD-10-CM | POA: Insufficient documentation

## 2020-06-11 DIAGNOSIS — M159 Polyosteoarthritis, unspecified: Secondary | ICD-10-CM | POA: Insufficient documentation

## 2020-06-11 DIAGNOSIS — M899 Disorder of bone, unspecified: Secondary | ICD-10-CM | POA: Diagnosis not present

## 2020-06-11 DIAGNOSIS — M2011 Hallux valgus (acquired), right foot: Secondary | ICD-10-CM | POA: Insufficient documentation

## 2020-06-11 DIAGNOSIS — F32A Depression, unspecified: Secondary | ICD-10-CM | POA: Diagnosis not present

## 2020-06-11 DIAGNOSIS — I2510 Atherosclerotic heart disease of native coronary artery without angina pectoris: Secondary | ICD-10-CM | POA: Insufficient documentation

## 2020-06-11 DIAGNOSIS — Z170 Estrogen receptor positive status [ER+]: Secondary | ICD-10-CM | POA: Insufficient documentation

## 2020-06-11 DIAGNOSIS — Z7982 Long term (current) use of aspirin: Secondary | ICD-10-CM | POA: Insufficient documentation

## 2020-06-11 DIAGNOSIS — M79671 Pain in right foot: Secondary | ICD-10-CM | POA: Diagnosis not present

## 2020-06-11 DIAGNOSIS — R2241 Localized swelling, mass and lump, right lower limb: Secondary | ICD-10-CM | POA: Diagnosis not present

## 2020-06-11 DIAGNOSIS — K219 Gastro-esophageal reflux disease without esophagitis: Secondary | ICD-10-CM | POA: Diagnosis not present

## 2020-06-11 DIAGNOSIS — G709 Myoneural disorder, unspecified: Secondary | ICD-10-CM | POA: Diagnosis not present

## 2020-06-11 DIAGNOSIS — I129 Hypertensive chronic kidney disease with stage 1 through stage 4 chronic kidney disease, or unspecified chronic kidney disease: Secondary | ICD-10-CM | POA: Diagnosis not present

## 2020-06-11 DIAGNOSIS — Z923 Personal history of irradiation: Secondary | ICD-10-CM | POA: Diagnosis not present

## 2020-06-11 DIAGNOSIS — I739 Peripheral vascular disease, unspecified: Secondary | ICD-10-CM | POA: Insufficient documentation

## 2020-06-11 DIAGNOSIS — C50412 Malignant neoplasm of upper-outer quadrant of left female breast: Secondary | ICD-10-CM | POA: Insufficient documentation

## 2020-06-11 HISTORY — PX: EXCISION MORTON'S NEUROMA: SHX5013

## 2020-06-11 HISTORY — PX: AIKEN OSTEOTOMY: SHX6331

## 2020-06-11 HISTORY — PX: HAMMER TOE SURGERY: SHX385

## 2020-06-11 SURGERY — BUNIONECTOMY, AKIN
Anesthesia: General | Site: Toe | Laterality: Right

## 2020-06-11 MED ORDER — BUPIVACAINE LIPOSOME 1.3 % IJ SUSP
INTRAMUSCULAR | Status: DC | PRN
  Administered 2020-06-11 (×2): 10 mL

## 2020-06-11 MED ORDER — DEXAMETHASONE SODIUM PHOSPHATE 10 MG/ML IJ SOLN
INTRAMUSCULAR | Status: DC | PRN
  Administered 2020-06-11: 5 mg via INTRAVENOUS

## 2020-06-11 MED ORDER — BUPIVACAINE LIPOSOME 1.3 % IJ SUSP
INTRAMUSCULAR | Status: AC
  Filled 2020-06-11: qty 20

## 2020-06-11 MED ORDER — CLINDAMYCIN PHOSPHATE 900 MG/50ML IV SOLN
INTRAVENOUS | Status: AC
  Filled 2020-06-11: qty 50

## 2020-06-11 MED ORDER — PROPOFOL 10 MG/ML IV BOLUS
INTRAVENOUS | Status: AC
  Filled 2020-06-11: qty 20

## 2020-06-11 MED ORDER — BUPIVACAINE HCL (PF) 0.5 % IJ SOLN
INTRAMUSCULAR | Status: AC
  Filled 2020-06-11: qty 30

## 2020-06-11 MED ORDER — LIDOCAINE HCL (CARDIAC) PF 100 MG/5ML IV SOSY
PREFILLED_SYRINGE | INTRAVENOUS | Status: DC | PRN
  Administered 2020-06-11: 40 mg via INTRAVENOUS

## 2020-06-11 MED ORDER — FAMOTIDINE 20 MG PO TABS
ORAL_TABLET | ORAL | Status: AC
  Filled 2020-06-11: qty 1

## 2020-06-11 MED ORDER — ONDANSETRON HCL 4 MG/2ML IJ SOLN
INTRAMUSCULAR | Status: AC
  Filled 2020-06-11: qty 2

## 2020-06-11 MED ORDER — DEXAMETHASONE SODIUM PHOSPHATE 10 MG/ML IJ SOLN
INTRAMUSCULAR | Status: AC
  Filled 2020-06-11: qty 1

## 2020-06-11 MED ORDER — PROPOFOL 10 MG/ML IV BOLUS
INTRAVENOUS | Status: DC | PRN
  Administered 2020-06-11: 100 mg via INTRAVENOUS
  Administered 2020-06-11: 50 mg via INTRAVENOUS

## 2020-06-11 MED ORDER — LIDOCAINE HCL (PF) 1 % IJ SOLN
INTRAMUSCULAR | Status: AC
  Filled 2020-06-11: qty 30

## 2020-06-11 MED ORDER — LIDOCAINE-EPINEPHRINE 1 %-1:100000 IJ SOLN
INTRAMUSCULAR | Status: AC
  Filled 2020-06-11: qty 1

## 2020-06-11 MED ORDER — BUPIVACAINE HCL (PF) 0.25 % IJ SOLN
INTRAMUSCULAR | Status: AC
  Filled 2020-06-11: qty 30

## 2020-06-11 MED ORDER — FENTANYL CITRATE (PF) 100 MCG/2ML IJ SOLN
INTRAMUSCULAR | Status: DC | PRN
Start: 2020-06-11 — End: 2020-06-11
  Administered 2020-06-11 (×4): 25 ug via INTRAVENOUS

## 2020-06-11 MED ORDER — CHLORHEXIDINE GLUCONATE 0.12 % MT SOLN
OROMUCOSAL | Status: AC
  Administered 2020-06-11: 15 mL via OROMUCOSAL
  Filled 2020-06-11: qty 15

## 2020-06-11 MED ORDER — CHLORHEXIDINE GLUCONATE 0.12 % MT SOLN: 15.0000 mL | Freq: Once | OROMUCOSAL | Status: AC

## 2020-06-11 MED ORDER — SODIUM CHLORIDE 0.9 % IV SOLN
INTRAVENOUS | Status: DC | PRN
  Administered 2020-06-11: 30 ug/min via INTRAVENOUS

## 2020-06-11 MED ORDER — CLINDAMYCIN PHOSPHATE 900 MG/50ML IV SOLN
900.0000 mg | INTRAVENOUS | Status: AC
  Administered 2020-06-11: 900 mg via INTRAVENOUS

## 2020-06-11 MED ORDER — EPHEDRINE SULFATE 50 MG/ML IJ SOLN
INTRAMUSCULAR | Status: DC | PRN
  Administered 2020-06-11 (×3): 10 mg via INTRAVENOUS

## 2020-06-11 MED ORDER — FAMOTIDINE 20 MG PO TABS: 20.0000 mg | ORAL_TABLET | Freq: Once | ORAL | Status: DC

## 2020-06-11 MED ORDER — FENTANYL CITRATE (PF) 100 MCG/2ML IJ SOLN
INTRAMUSCULAR | Status: AC
  Filled 2020-06-11: qty 2

## 2020-06-11 MED ORDER — ORAL CARE MOUTH RINSE: 15.0000 mL | Freq: Once | OROMUCOSAL | Status: AC

## 2020-06-11 MED ORDER — BUPIVACAINE HCL (PF) 0.25 % IJ SOLN
INTRAMUSCULAR | Status: DC | PRN
  Administered 2020-06-11 (×2): 10 mL
  Administered 2020-06-11: 2 mL

## 2020-06-11 MED ORDER — POVIDONE-IODINE 10 % EX SWAB: 2.0000 "application " | Freq: Once | CUTANEOUS | Status: DC

## 2020-06-11 MED ORDER — OXYCODONE-ACETAMINOPHEN 5-325 MG PO TABS: 1.0000 | ORAL_TABLET | ORAL | 0 refills | Status: DC | PRN

## 2020-06-11 MED ORDER — ONDANSETRON HCL 4 MG/2ML IJ SOLN
INTRAMUSCULAR | Status: DC | PRN
  Administered 2020-06-11: 4 mg via INTRAVENOUS

## 2020-06-11 MED ORDER — FENTANYL CITRATE (PF) 100 MCG/2ML IJ SOLN: 25.0000 ug | INTRAMUSCULAR | Status: DC | PRN

## 2020-06-11 MED ORDER — ONDANSETRON HCL 4 MG/2ML IJ SOLN: 4.0000 mg | Freq: Once | INTRAMUSCULAR | Status: DC | PRN

## 2020-06-11 MED ORDER — LACTATED RINGERS IV SOLN: INTRAVENOUS | Status: DC

## 2020-06-11 MED ORDER — EPHEDRINE 5 MG/ML INJ
INTRAVENOUS | Status: AC
  Filled 2020-06-11: qty 10

## 2020-06-11 SURGICAL SUPPLY — 77 items
APL SKNCLS STERI-STRIP NONHPOA (GAUZE/BANDAGES/DRESSINGS) ×2
BENZOIN TINCTURE PRP APPL 2/3 (GAUZE/BANDAGES/DRESSINGS) ×3 IMPLANT
BIT DRILL CANNULATED 2.0 (BIT) ×3 IMPLANT
BLADE MED AGGRESSIVE (BLADE) ×3 IMPLANT
BLADE OSC/SAGITTAL MD 5.5X18 (BLADE) ×3 IMPLANT
BLADE SURG 15 STRL LF DISP TIS (BLADE) IMPLANT
BLADE SURG 15 STRL SS (BLADE)
BNDG CMPR 75X41 PLY HI ABS (GAUZE/BANDAGES/DRESSINGS) ×2
BNDG CMPR STD VLCR NS LF 5.8X4 (GAUZE/BANDAGES/DRESSINGS) ×2
BNDG COHESIVE 4X5 TAN STRL (GAUZE/BANDAGES/DRESSINGS) ×3 IMPLANT
BNDG CONFORM 2 STRL LF (GAUZE/BANDAGES/DRESSINGS) ×3 IMPLANT
BNDG CONFORM 3 STRL LF (GAUZE/BANDAGES/DRESSINGS) ×3 IMPLANT
BNDG ELASTIC 4X5.8 VLCR NS LF (GAUZE/BANDAGES/DRESSINGS) ×3 IMPLANT
BNDG ESMARK 4X12 TAN STRL LF (GAUZE/BANDAGES/DRESSINGS) ×3 IMPLANT
BNDG GAUZE 4.5X4.1 6PLY STRL (MISCELLANEOUS) ×3 IMPLANT
BNDG STRETCH 4X75 STRL LF (GAUZE/BANDAGES/DRESSINGS) ×3 IMPLANT
CANISTER SUCT 1200ML W/VALVE (MISCELLANEOUS) ×3 IMPLANT
CNTRSNK DRL 2.5X2.5X (MISCELLANEOUS) ×2 IMPLANT
COUNTERSINK 2.5 (MISCELLANEOUS) ×3
COVER LIGHT HANDLE STERIS (MISCELLANEOUS) ×6 IMPLANT
COVER WAND RF STERILE (DRAPES) ×3 IMPLANT
CUFF TOURN SGL QUICK 12 (TOURNIQUET CUFF) IMPLANT
CUFF TOURN SGL QUICK 18X4 (TOURNIQUET CUFF) ×3 IMPLANT
DRAPE FLUOR MINI C-ARM 54X84 (DRAPES) ×3 IMPLANT
DURAPREP 26ML APPLICATOR (WOUND CARE) ×3 IMPLANT
ELECT REM PT RETURN 9FT ADLT (ELECTROSURGICAL) ×3
ELECTRODE REM PT RTRN 9FT ADLT (ELECTROSURGICAL) ×2 IMPLANT
GAUZE SPONGE 4X4 12PLY STRL (GAUZE/BANDAGES/DRESSINGS) ×3 IMPLANT
GAUZE XEROFORM 1X8 LF (GAUZE/BANDAGES/DRESSINGS) ×3 IMPLANT
GLOVE BIO SURGEON STRL SZ7.5 (GLOVE) ×3 IMPLANT
GLOVE INDICATOR 8.0 STRL GRN (GLOVE) ×3 IMPLANT
GOWN STRL REUS W/ TWL XL LVL3 (GOWN DISPOSABLE) ×4 IMPLANT
GOWN STRL REUS W/TWL XL LVL3 (GOWN DISPOSABLE) ×6
GUIDEWIRE .9 (WIRE) ×6 IMPLANT
K-WIRE DBL END TROCAR 6X.045 (WIRE) ×3
K-WIRE DBL END TROCAR 6X.062 (WIRE) ×3
KIT PROCEDURE DRILL (DRILL) ×3 IMPLANT
KIT TURNOVER KIT A (KITS) ×3 IMPLANT
KWIRE DBL END TROCAR 6X.045 (WIRE) ×2 IMPLANT
KWIRE DBL END TROCAR 6X.062 (WIRE) ×2 IMPLANT
LABEL OR SOLS (LABEL) ×3 IMPLANT
NEEDLE FILTER BLUNT 18X 1/2SAF (NEEDLE) ×1
NEEDLE FILTER BLUNT 18X1 1/2 (NEEDLE) ×2 IMPLANT
NEEDLE HYPO 22GX1.5 SAFETY (NEEDLE) ×3 IMPLANT
NEEDLE HYPO 25X1 1.5 SAFETY (NEEDLE) ×6 IMPLANT
NS IRRIG 500ML POUR BTL (IV SOLUTION) ×3 IMPLANT
PACK EXTREMITY (MISCELLANEOUS) ×3 IMPLANT
PENCIL ELECTRO HAND CTR (MISCELLANEOUS) ×3 IMPLANT
PIN BALLS 3/8 F/.045 WIRE (MISCELLANEOUS) ×3 IMPLANT
RASP SM TEAR CROSS CUT (RASP) IMPLANT
SCREW 2.5X16 HEADLESS (Screw) ×3 IMPLANT
STAPLE DYNACLIP 8X8 (Staple) ×3 IMPLANT
STOCKINETTE IMPERV 14X48 (MISCELLANEOUS) ×3 IMPLANT
STOCKINETTE M/LG 89821 (MISCELLANEOUS) ×3 IMPLANT
STRAP SAFETY 5IN WIDE (MISCELLANEOUS) IMPLANT
STRIP CLOSURE SKIN 1/4X4 (GAUZE/BANDAGES/DRESSINGS) ×3 IMPLANT
SUT ETHILON 4-0 (SUTURE)
SUT ETHILON 4-0 FS2 18XMFL BLK (SUTURE)
SUT ETHILON 5-0 FS-2 18 BLK (SUTURE) IMPLANT
SUT ETHILON NAB PS2 4-0 18IN (SUTURE) ×3 IMPLANT
SUT MNCRL 5-0+ PC-1 (SUTURE) ×2 IMPLANT
SUT MNCRL+ 5-0 UNDYED PC-3 (SUTURE) ×2 IMPLANT
SUT MONOCRYL 5-0 (SUTURE) ×6
SUT VIC AB 0 SH 27 (SUTURE) ×6 IMPLANT
SUT VIC AB 2-0 SH 27 (SUTURE)
SUT VIC AB 2-0 SH 27XBRD (SUTURE) IMPLANT
SUT VIC AB 3-0 SH 27 (SUTURE) ×3
SUT VIC AB 3-0 SH 27X BRD (SUTURE) ×2 IMPLANT
SUT VIC AB 4-0 FS2 27 (SUTURE) ×3 IMPLANT
SUT VIC AB 4-0 SH 27 (SUTURE) ×3
SUT VIC AB 4-0 SH 27XANBCTRL (SUTURE) ×2 IMPLANT
SUTURE ETHLN 4-0 FS2 18XMF BLK (SUTURE) IMPLANT
SWABSTK COMLB BENZOIN TINCTURE (MISCELLANEOUS) ×3 IMPLANT
SYR 10ML LL (SYRINGE) ×3 IMPLANT
SYR 50ML LL SCALE MARK (SYRINGE) IMPLANT
TOWEL OR 17X26 4PK STRL BLUE (TOWEL DISPOSABLE) ×3 IMPLANT
k-wire ×6 IMPLANT

## 2020-06-11 NOTE — Discharge Instructions (Signed)
[facility]  POST OPERATIVE INSTRUCTIONS FOR DR. TROXLER, DR. FOWLER, AND DR. [facility] PODIATRY DEPARTMENT   1. Take your medication as prescribed.  Pain medication should be taken only as needed.  2. Keep the dressing clean, dry and intact.  3. Keep your foot elevated above the heart level for the first 48 hours.  4. Walking to the bathroom and brief periods of walking are acceptable, unless we have instructed you to be non-weight bearing.  5. Always wear your post-op shoe when walking.  Always use your crutches if you are to be non-weight bearing.  6. Do not take a shower. Baths are permissible as long as the foot is kept out of the water.   7. Every hour you are awake:  - Bend your knee 15 times. - Flex foot 15 times - Massage calf 15 times  8. Call [facility] ([phone]) if any of the following problems occur: - You develop a temperature or fever. - The bandage becomes saturated with blood. - Medication does not stop your pain. - Injury of the foot occurs. - Any symptoms of infection including redness, odor, or red streaks running from wound.  AMBULATORY SURGERY  DISCHARGE INSTRUCTIONS  1) The drugs that you were given will stay in your system until tomorrow so for the next 24 hours you should not: A) Drive an automobile B) Make any legal decisions C) Drink any alcoholic beverage  2) You may resume regular meals tomorrow.  Today it is better to start with liquids and gradually work up to solid foods. You may eat anything you prefer, but it is better to start with liquids, then soup and crackers, and gradually work up to solid foods.  3) Please notify your doctor immediately if you have any unusual bleeding, trouble breathing, redness and pain at the surgery site, drainage, fever, or pain not relieved by medication.  4) Additional Instructions:   Please contact your physician with any problems or Same  Day Surgery at [phone], Monday through Friday 6 am to 4 pm, or [place] [facility] [place] Main number at [phone]. 

## 2020-06-11 NOTE — H&P (Signed)
HISTORY AND PHYSICAL INTERVAL NOTE:  06/11/2020  11:05 AM  Marie Parker  has presented today for surgery, with the diagnosis of M79.671 RIGHT FOOT PAIN M20.11 HALLUX VALGUS RIGHT M20.41 HAMMERTOE RIGHT G57.61 LESION RIGHT PLANTAR NERVE.  The various methods of treatment have been discussed with the patient.  No guarantees were given.  After consideration of risks, benefits and other options for treatment, the patient has consented to surgery.  I have reviewed the patients' chart and labs.     Parker history and physical examination was performed in my office.  The patient was reexamined.  There have been no changes to this history and physical examination.  Marie Parker

## 2020-06-11 NOTE — Op Note (Signed)
Operative note   Surgeon:Namon Villarin Lawyer: None    Preop diagnosis: 1.  Hallux valgus deformity right foot 2.  Hammertoe contracture right second toe 3.  Neuroma right second webspace 4.  Tailor's exostosis right fifth metatarsal 5.  Soft tissue mass dorsal lateral right foot    Postop diagnosis: Same    Procedure: 1.  [place] with Akin double osteotomy right foot 2.  Hammertoe repair with PIPJ arthrodesis right second toe 3.  Excision Morton's neuroma right second webspace 4.  Tailor's exostectomy dorsal lateral fifth MTPJ 5.  Open incision and exploration of soft tissue mass dorsal right foot    EBL: Minimal    Anesthesia:local and general    Hemostasis: Ankle tourniquet inflated to 200 mmHg for approximately 90 minutes    Specimen: Morton's neuroma right second webspace    Complications: None    Operative indications:Marie Parker is an 74 y.o. that presents today for surgical intervention.  The risks/benefits/alternatives/complications have been discussed and consent has been given.    Procedure:  Patient was brought into the OR and placed on the operating table in thesupine position. After anesthesia was obtained theright lower extremity was prepped and draped in usual sterile fashion.  Attention was directed to the dorsomedial aspect of the right first MTPJ where a dorsomedial incision was performed.  Sharp and blunt dissection was carried down to the capsule.  A T capsulotomy was performed.  The prominent medial eminence was noted and transected with a power saw.  Next a V osteotomy was created.  The capital fragment was translocated and rotated laterally to a better aligned position.  Greater than 50% displacement was noted at this time.  Good reduction of the intermetatarsal angle was noted.  This was then stabilized with a small 2.0 mm cannulated headless screw.  This was supplemented with a 0.045 K wire for stabilization.  The ensuing overhanging ledge was transected  with a power saw.  Mild residual valgus of the great toe was noted.  Next an Akin osteotomy was created with the apex laterally.  This was then stabilized with a compression bone staple.  Good alignment was noted.  Wounds were closed with a 3-0 Vicryl for the capsule.  A small capsulorrhaphy was performed medially.  4-0 Vicryl for the subcutaneous tissue and a 4 Monocryl for the skin.  Attention was then directed to the second toe where a longitudinal incision was performed at the PIPJ.  Sharp and blunt dissection carried down to the extensor tendon.  The tendon was then incised.  The head of the proximal phalanx and base of the middle phalanx was exposed.  This was then excised with a power saw.  A 0.045 K wire was driven from the base of the middle phalanx to the tip of the toe and retrograded back to the base of the proximal phalanx.  Good alignment and stability was noted.  Wounds were flushed with copious amounts of irrigation.  Closure was performed with a 4-0 Vicryl the deeper tissues and tendon and a 4-0 nylon for skin.    Attention was then directed to the second webspace where longitudinal with splitting incision was performed.  Sharp and blunt dissection was carried down to the DTI L.  This was then transected.  Further dissection revealed a bulbous lesion consistent with a Morton's neuroma.  The distal arms were then transected and this was brought back proximal to the metatarsal head and transected.  This was sent for pathological  examination.  The wound was flushed with copious amounts of irrigation and closure was performed with a 4-0 Vicryl for the subcutaneous tissue and a 4-0 nylon for skin.  Attention was directed to the dorsal lateral right fifth MTPJ where a dorsal lateral incision was performed.  Sharp and blunt dissection were carried down to the capsule.  A longitudinal capsulotomy was then performed.  The dorsal lateral head of the fifth metatarsal was then exposed.  This was then  transected with a power saw.  This was smoothed.  This wound was then closed with a 4-0 Vicryl the deeper and subcutaneous tissue and a 5-0 Monocryl for skin.  Finally a small soft tissue mass was noted to the dorsal lateral right foot.  This was at the level of the fourth metatarsal base.  The dorsal lateral incision was performed.  Dissection was carried down.  The superficial nerve was found in this region.  At this time further dissection revealed a muscle body to one of the lesser tendons on the dorsal lateral foot.  This was found to be within normal limits.  No obvious abnormality was noted.  Suspect the muscle had herniated through some of the superficial fascia.  This looked to be normal tissue.  At this time the wound was then closed.  Closure was performed with a 4-0 Vicryl the deeper layer and a 4-0 nylon for skin.    Patient tolerated the procedure and anesthesia well.  Was transported from the OR to the PACU with all vital signs stable and vascular status intact. To be discharged per routine protocol.  Will follow up in approximately 1 week in the outpatient clinic.

## 2020-06-11 NOTE — Transfer of Care (Signed)
Immediate Anesthesia Transfer of Care Note  Patient: Marie Parker  Procedure(s) Performed: DOUBLE OSTEOTOMY RIGHT (Right Toe) EXCISION MORTON'S NEUROMA (Right Foot) HAMMER TOE CORRECTION T6 (Right Toe)  Patient Location: PACU  Anesthesia Type:General  Level of Consciousness: awake, alert , oriented and patient cooperative  Airway & Oxygen Therapy: Patient Spontanous Breathing  Post-op Assessment: Report given to RN and Post -op Vital signs reviewed and stable  Post vital signs: Reviewed and stable  Last Vitals:  Vitals Value Taken Time  BP 120/87 06/11/20 1324  Temp 36.9 C 06/11/20 1322  Pulse 88 06/11/20 1326  Resp 15 06/11/20 1326  SpO2 90 % 06/11/20 1326  Vitals shown include unvalidated device data.  Last Pain:  Vitals:   06/11/20 0859  TempSrc: Temporal  PainSc: 0-No pain         Complications: No complications documented.

## 2020-06-11 NOTE — Anesthesia Procedure Notes (Signed)
Procedure Name: LMA Insertion Date/Time: 06/11/2020 11:16 AM Performed by: Eben Burow, CRNA Pre-anesthesia Checklist: Patient identified, Emergency Drugs available, Suction available and Patient being monitored Patient Re-evaluated:Patient Re-evaluated prior to induction Oxygen Delivery Method: Circle system utilized Preoxygenation: Pre-oxygenation with 100% oxygen Induction Type: IV induction Ventilation: Mask ventilation without difficulty LMA: LMA inserted LMA Size: 3.5 Number of attempts: 1 Placement Confirmation: positive ETCO2 Tube secured with: Tape Dental Injury: Teeth and Oropharynx as per pre-operative assessment

## 2020-06-14 ENCOUNTER — Encounter: Payer: Self-pay | Admitting: Podiatry

## 2020-06-14 LAB — SURGICAL PATHOLOGY

## 2020-06-14 NOTE — Anesthesia Postprocedure Evaluation (Signed)
Anesthesia Post Note  Patient: [facility]  Procedure(s) Performed: DOUBLE OSTEOTOMY RIGHT (Right Toe) EXCISION MORTON'S NEUROMA (Right Foot) HAMMER TOE CORRECTION T6 (Right Toe)  Patient location during evaluation: PACU Anesthesia Type: General Level of consciousness: awake and alert Pain management: pain level controlled Vital Signs Assessment: post-procedure vital signs reviewed and stable Respiratory status: spontaneous breathing, nonlabored ventilation, respiratory function stable and patient connected to nasal cannula oxygen Cardiovascular status: blood pressure returned to baseline and stable Postop Assessment: no apparent nausea or vomiting Anesthetic complications: no   No complications documented.   Last Vitals:  Vitals:   06/11/20 1400 06/11/20 1412  BP: 128/70 (!) 122/93  Pulse: 83 88  Resp: 16 16  Temp: 36.7 C 36.4 C  SpO2: 96% 98%    Last Pain:  Vitals:   06/11/20 1412  TempSrc: Temporal  PainSc: 0-No pain                 Molli Barrows

## 2020-06-14 NOTE — Anesthesia Preprocedure Evaluation (Signed)
Anesthesia Evaluation  Patient identified by MRN, date of birth, ID band Patient awake    Reviewed: Allergy & Precautions, H&P , NPO status , Patient's Chart, lab work & pertinent test results, reviewed documented beta blocker date and time   Airway Mallampati: II   Neck ROM: full    Dental  (+) Teeth Intact   Pulmonary neg pulmonary ROS, former smoker,    Pulmonary exam normal        Cardiovascular Exercise Tolerance: Poor hypertension, On Medications + CAD and + Peripheral Vascular Disease  Normal cardiovascular exam Rhythm:regular Rate:Normal     Neuro/Psych PSYCHIATRIC DISORDERS Anxiety Depression  Neuromuscular disease    GI/Hepatic Neg liver ROS, GERD  Medicated,  Endo/Other  negative endocrine ROS  Renal/GU Renal disease  negative genitourinary   Musculoskeletal   Abdominal   Peds  Hematology negative hematology ROS (+)   Anesthesia Other Findings Past Medical History: No date: Anxiety No date: Arthritis No date: Breast cancer ([place]) 04/22/2018: Breast cancer of upper-outer quadrant of left female  breast ([place])     Comment:  16 mm, T1c, N0.  ER/PR positive, HER-2/neu not               overexpressed.  Low MammaPrint score.   No date: CAD (coronary artery disease) No date: Cancer [facility])     Comment:  squamous cell No date: Cataract 04/10/2018: Cholecystitis No date: Chronic kidney disease 2017?: Colon cancer screening     Comment:  negative cologuard No date: Depression No date: Family history of adverse reaction to anesthesia     Comment:  mother had N/V No date: Generalized osteoarthritis No date: GERD (gastroesophageal reflux disease) No date: Gout 11/2012: History of endoscopy     Comment:  Gastritis No date: Hyperlipidemia No date: Hypertension No date: IFG (impaired fasting glucose) No date: Insomnia No date: Menopause 11/28/2019: Morton's neuroma of right foot No date: Osteoporosis No  date: Parathyroid adenoma 2020: Personal history of radiation therapy     Comment:  Left breast 02/02/2016: Personal history of tobacco use, presenting hazards to  health No date: PVD (peripheral vascular disease) ([place]) No date: PVD (peripheral vascular disease) ([place]) No date: Rotator cuff syndrome of shoulder and allied disorders No date: Tobacco abuse Past Surgical History: 2014: BREAST BIOPSY; Left     Comment:  COMPLEX FIBROADENOMA 04/05/2018: BREAST BIOPSY; Left     Comment:  [facility] 1970[facility]: BREAST EXCISIONAL BIOPSY; Left     Comment:  neg No date: BREAST FIBROADENOMA SURGERY No date: BREAST LUMPECTOMY 04/2018: BREAST MAMMOSITE; Left 09/17/2007: CARDIAC CATHETERIZATION     Comment:  Insignificant CAD with 40-50% stenosis of mid LAD.  02/09/2020: COLONOSCOPY WITH PROPOFOL; N/A     Comment:  Procedure: COLONOSCOPY WITH PROPOFOL;  Surgeon: Lin Landsman, MD;  Location: [facility] ENDOSCOPY;  Service:               Gastroenterology;  Laterality: N/A; 02/09/2020: ESOPHAGOGASTRODUODENOSCOPY (EGD) WITH PROPOFOL; N/A     Comment:  Procedure: ESOPHAGOGASTRODUODENOSCOPY (EGD) WITH               PROPOFOL;  Surgeon: Lin Landsman, MD;  Location:               [facility] ENDOSCOPY;  Service: Gastroenterology;  Laterality:               N/A; No date: ILIAC ARTERY STENT     Comment:  right iliac artery stent  04/22/2018: MASTECTOMY, PARTIAL; Left     Comment:  Procedure: MASTECTOMY PARTIAL;  Surgeon: Robert Bellow, MD;  Location: [facility] ORS;  Service: General;                Laterality: Left; 03/22/2016: ORIF PATELLA; Left     Comment:  Dr. Tristan Schroeder 2012: PARATHYROIDECTOMY     Comment:  [facility] 04/22/2018: SENTINEL NODE BIOPSY; Left     Comment:  Procedure: SENTINEL NODE BIOPSY;  Surgeon: Robert Bellow, MD;  Location: [facility] ORS;  Service: General;                Laterality: Left; No date: SKIN BIOPSY     Comment:  precancerous BMI    Body Mass  Index: 27.27 kg/m     Reproductive/Obstetrics negative OB ROS                             Anesthesia Physical Anesthesia Plan  ASA: III  Anesthesia Plan: General   Post-op Pain Management:    Induction:   PONV Risk Score and Plan:   Airway Management Planned:   Additional Equipment:   Intra-op Plan:   Post-operative Plan:   Informed Consent: I have reviewed the patients History and Physical, chart, labs and discussed the procedure including the risks, benefits and alternatives for the proposed anesthesia with the patient or authorized representative who has indicated his/her understanding and acceptance.     Dental Advisory Given  Plan Discussed with: CRNA  Anesthesia Plan Comments:         Anesthesia Quick Evaluation

## 2020-06-15 ENCOUNTER — Other Ambulatory Visit: Payer: Medicare Other

## 2020-06-16 DIAGNOSIS — M2011 Hallux valgus (acquired), right foot: Secondary | ICD-10-CM | POA: Diagnosis not present

## 2020-06-23 DIAGNOSIS — M2011 Hallux valgus (acquired), right foot: Secondary | ICD-10-CM | POA: Diagnosis not present

## 2020-07-26 ENCOUNTER — Ambulatory Visit: Payer: Medicare Other | Admitting: Licensed Clinical Social Worker

## 2020-07-26 DIAGNOSIS — F419 Anxiety disorder, unspecified: Secondary | ICD-10-CM

## 2020-07-26 DIAGNOSIS — Z636 Dependent relative needing care at home: Secondary | ICD-10-CM

## 2020-07-26 DIAGNOSIS — I2510 Atherosclerotic heart disease of native coronary artery without angina pectoris: Secondary | ICD-10-CM

## 2020-07-26 DIAGNOSIS — E782 Mixed hyperlipidemia: Secondary | ICD-10-CM

## 2020-07-26 DIAGNOSIS — M2011 Hallux valgus (acquired), right foot: Secondary | ICD-10-CM | POA: Diagnosis not present

## 2020-07-26 NOTE — Chronic Care Management (AMB) (Signed)
Chronic Care Management    Clinical Social Work Follow Up Note  07/26/2020 Name: Marie Parker MRN: [id number] DOB: Jun 24, 1946  Marie Parker is a 74 y.o. year old female who is a primary care patient of Valerie Roys, DO. The CCM team was consulted for assistance with Mental Health Counseling and Resources.   Review of patient status, including review of consultants reports, other relevant assessments, and collaboration with appropriate care team members and the patient's provider was performed as part of comprehensive patient evaluation and provision of chronic care management services.    SDOH (Social Determinants of Health) assessments performed: Yes    Outpatient Encounter Medications as of 07/26/2020  Medication Sig  . acetaminophen (TYLENOL) 650 MG CR tablet Take 1,300 mg by mouth daily as needed for pain.   [name] amitriptyline (ELAVIL) 25 MG tablet Take 1 tablet (25 mg total) by mouth at bedtime.  [name] amLODipine (NORVASC) 10 MG tablet Take 1 tablet (10 mg total) by mouth daily.  [name] aspirin 81 MG tablet Take 1 tablet (81 mg total) by mouth daily. (Patient taking differently: Take 81 mg by mouth at bedtime. )  . atenolol (TENORMIN) 50 MG tablet Take 1 tablet (50 mg total) by mouth daily.  [name] atorvastatin (LIPITOR) 40 MG tablet Take 1 tablet (40 mg total) by mouth daily. (Patient taking differently: Take 40 mg by mouth at bedtime. )  . benazepril (LOTENSIN) 40 MG tablet Take 1 tablet (40 mg total) by mouth daily.  [name] CALCIUM-VITAMIN D PO Take 2 tablets by mouth daily.  . clonazePAM (KLONOPIN) 0.5 MG tablet Take 0.5-1 tablets (0.25-0.5 mg total) by mouth 2 (two) times daily as needed for anxiety.  . dicyclomine (BENTYL) 10 MG capsule Take 10 mg by mouth daily as needed for spasms.   . fluticasone (FLONASE) 50 MCG/ACT nasal spray Place 2 sprays into both nostrils daily. (Patient taking differently: Place 2 sprays into both nostrils daily as needed for allergies. )  . hydrALAZINE  (APRESOLINE) 25 MG tablet Take 1 tablet (25 mg total) by mouth 2 (two) times daily.  [name] letrozole (FEMARA) 2.5 MG tablet TAKE 1 TABLET(2.5 MG) BY MOUTH DAILY (Patient taking differently: Take 2.5 mg by mouth daily. )  . Multiple Vitamin (MULTIVITAMIN WITH MINERALS) TABS tablet Take 1 tablet by mouth daily.  . niacin 500 MG tablet Take 1,000 mg by mouth at bedtime.  [name] omeprazole (PRILOSEC) 40 MG capsule Take 1 capsule (40 mg total) by mouth 2 (two) times daily before a meal.  . oxyCODONE-acetaminophen (PERCOCET) 5-325 MG tablet Take 1-2 tablets by mouth every 4 (four) hours as needed for severe pain.  . Probiotic Product (PROBIOTIC-10 PO) Take by mouth daily.   . sertraline (ZOLOFT) 100 MG tablet Take 1.5 tablets (150 mg total) by mouth daily.  . traMADol (ULTRAM) 50 MG tablet Take 50 mg by mouth every 6 (six) hours as needed for moderate pain.   . Turmeric 500 MG TABS Take 500 mg by mouth daily.    No facility-administered encounter medications on file as of 07/26/2020.   Patient Care Plan: General Social Work (Adult)    Problem Identified: SW- I want to continue taking good care of myself   Priority: Medium  Onset Date: 07/26/2020    Goal: Anxiety Symptoms Monitored and Managed   Start Date: 07/26/2020  Priority: Medium  Note:   Evidence-based guidance:   Discuss adherence to medication therapy; assess and manage barriers, such as costs, side effects, feeling little or no  improvement, stigma or low motivation.   Discuss past experiences with psychotropic medication, potential side effects and need to continue medication until treatment becomes effective (e.g., 4 to 8 weeks).   Explain the interplay of stress and physical symptoms, as well as the relationship between illness and emotional problems.   Explore complementary and alternative therapy options, such as applied relaxation, mindfulness, meditation, music therapy, aromatherapy, acupuncture or massage.   Prepare patient for  antidepressant pharmacologic therapy which may include additional short-term medications until maintenance medications demonstrate therapeutic effect.    Assess response; monitor and manage side effects.   Prepare patient for use of pharmacologic therapy (e.g., anxiolytic, selective serotonin-reuptake inhibitor, serotonin norepinephrine-reuptake inhibitor, tricyclic antidepressant, antiepileptic, antipsychotic for comorbid depression,    phosphodiesterase-inhibitor for sexual dysfunction, sedative or hypnotic for sleep disturbance); monitor and manage side effects.   Prepare patient for long-term pharmacologic therapy to prevent relapse (e.g., 12 months after symptom improvement).   Promote cognitive behavioral therapy, individualized to severity of symptoms [e.g., nonfacilitated self-help, facilitated (computerized or manual-based) self-help, face-to-face individual treatment].   Encourage patient to develop a self-management plan that identifies and manages lifestyle triggers (e.g., caffeine, stimulants, nicotine, stress), improves sleep, exercise or physical activity based on tolerance.   Prepare patient for a referral to a psychiatrist when patient has a poor response to treatment, atypical presentation or comorbid psychiatric disorder.   Provide frequent follow-up (e.g., every 2 weeks for the first 6 to 8 weeks of treatment and monthly thereafter).   Explore means to support work reintegration, such as modified working hours, peer support or coaching or a community jobs program.   Notes:   Current Barriers:  . Film/video editor . Limited social support . Level of care concerns . Mental Health Concerns  . Social Isolation . Limited access to caregiver- in terms of spouse . Lacks knowledge of community resource: available personal care service resources and [facility] resource connection within the area  Clinical Social Work Clinical Goal(s):  [name] Over the next 90 days, client will work  with SW to address concerns related to reducing anxiety and improving self-care  Interventions: . Patient interviewed and appropriate assessments performed . Provided mental health counseling with regard to managing anxiety. Educated patient on coping methods to implement into his daily life to combat depressive symptoms and stress. Patient denied any suicidal or homicidal ideations. Encouraged patient to implement deep breathing exercises into her daily routine due to ongoing anxiety. Patient reports having therapy in the past but denies wanting LCSW to make a referral for counseling. Patient was appreciative of the offer but wishes to manage her care without mental health therapy at this time. . Patient reports that since her Zoloft medication was increased by 50 mg, she is able to manage her depressive symptoms a lot more effectively. Patient is currently on 150 mg of Zoloft. Patient is also on amitriptyline. Patient reports that both of these medications have helped to alleviate her anxiety and depression. UPDATE 07/26/20-Patient reports that her overall anxiety has decreased. . Provided patient with information about available personal care service resources within the area. . CSW used motivational interviewing techniques to engage pt in meaningful conversation and to assess pt's wilingess to improve self-care and decrease her caregiver burnout symptoms. CSW explored pt's coping skills and access to community services.  . Patient is agreeable to start implementing deep breathing exercises into her daily routine to combat anxiety. She reports that she is currently doing word puzzles and games to occupy her  time and improve her brain functioning. Positive reinforcement provided.  [name]  LCSW provided reflective listening and implemented appropriate interventions to help suppport patient and her emotional needs  . Patient was receptive to emotional support provided during session . Discussed plans with  patient for ongoing care management follow up and provided patient with direct contact information for care management team . Advised patient to contact CCM program for any urgent needs. . Assisted patient/caregiver with obtaining information about health plan benefits . Patient reports that she sometimes feels "jittery" which may be a result of her medications but reports that she is able to effectively work through this when it arises. Patient reports "it doesn't last long."  . Provided education to patient/caregiver regarding level of care options. . Patient reports that she visited her foot surgeon today and no longer has to wear her boot. Patient reports her her foot is healing well.  . Patient reports that overall she is doing better and is gaining much needed socialization to combat her anxiety. Patient reports that she is currently out with her aunt and that they plan to go out to eat and get their hair done together. Positive reinforcement provided for self-care and socialization.  . Patient continues to work part time at [employer] where she receives active socialization.   Patient Self Care Activities:  . Attends all scheduled provider appointments . Calls provider office for new concerns or questions  Please see past updates related to this goal by clicking on the "Past Updates" button in the selected goal      Task: Alleviate Barriers to Anxiety Treatment Success   Note:   Care Management Activities:    - activity or exercise based on tolerance encouraged - awareness of emotional and physical triggers encouraged - barriers to care or meeting goals identified - cognitive behavior therapy provided - complementary therapy use encouraged - coping strategies encouraged - gradual re-participation in activities encouraged - sleep quality monitored    Notes:      Follow Up Plan: SW will follow up with patient by phone over the next quarter  Eula Fried, BSW, MSW,  [email or web address] Phone: [phone]

## 2020-07-27 ENCOUNTER — Other Ambulatory Visit: Payer: Self-pay | Admitting: Family Medicine

## 2020-07-27 DIAGNOSIS — K219 Gastro-esophageal reflux disease without esophagitis: Secondary | ICD-10-CM

## 2020-08-17 ENCOUNTER — Ambulatory Visit: Payer: Self-pay | Admitting: General Practice

## 2020-08-17 ENCOUNTER — Telehealth: Payer: Self-pay | Admitting: General Practice

## 2020-08-17 DIAGNOSIS — F419 Anxiety disorder, unspecified: Secondary | ICD-10-CM

## 2020-08-17 DIAGNOSIS — F411 Generalized anxiety disorder: Secondary | ICD-10-CM

## 2020-08-17 DIAGNOSIS — I129 Hypertensive chronic kidney disease with stage 1 through stage 4 chronic kidney disease, or unspecified chronic kidney disease: Secondary | ICD-10-CM

## 2020-08-17 DIAGNOSIS — E782 Mixed hyperlipidemia: Secondary | ICD-10-CM

## 2020-08-17 DIAGNOSIS — R197 Diarrhea, unspecified: Secondary | ICD-10-CM

## 2020-08-17 DIAGNOSIS — I2510 Atherosclerotic heart disease of native coronary artery without angina pectoris: Secondary | ICD-10-CM

## 2020-08-17 NOTE — Patient Instructions (Signed)
Visit Information  Goals Addressed              This Visit's Progress   .  RNCM- I am working towards decreasing my anxiety (pt-stated)        Current Barriers:  [name] Knowledge Deficits related to caregiver strain and reducing daily anxiety . Lacks caregiver support.   Nurse Case Manager Clinical Goal(s):  [name] Over the next 120 days, patient will work with [facility] to address needs related to reducing caregiver strain.  04-07-2020: The patients husband at this time is stable and the patients anxiety is better.  Still working with the patient on her anxiety level and maintaining a healthy mental status.  Interventions:  . Evaluation of current treatment plan related to anxiety/stress and patient's adherence to plan as established by provider. The patient has had an increase in her zoloft and her GI doctor started her on Amitriptyline.  The Amitriptyline had been working well. She had to take a half of a Klonopin today because she had gotten so worked up.  She stated that the GI doctor did not want her to use the Klonopin but she does not use it unless she absolutely has to do so.  The patient states she thinks a lot of it has to do with thinking in her mind that she has cancer and the colonoscopy it going to reveal this. Empathetic listening. 08-17-2020: The patient states that she is doing well. Had her surgery on her foot and is back to work. She denies any acute distress.  She states her surgery went well and she is doing very well.  . Advised patient to think about incorporating exercise into her daily routine, a healthy diet, and taking breaks and time for herself. 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 Discussed mindfulness exercises, writing in a journal, crafts, listening to music, taking time for self, reflection. The patient has a card game on her phone she plays and likes to color with an app she has on her phone.  06-09-2020: Reflective listening as the patient recounted the past year and the many things she has dealt with personally.  Praised the patient for being proactive in her care and utilization of resources to help her. She is thankful that her husband is doing so well. She told him now was the time for her to take care of him while she was down for a couple of months. 08-17-2020: The patient and her husband both are stable. She is thankful for her husbands health stabilizing and is happy that she is stable as well.  . Reviewed medications with patient and discussed increase in zoloft has been very helpful.  The patient saw pcp recently for anxiety and her zoloft has been increased. 08-17-2020: the medication changes have been effective in managing her anxiety and depression. The patient expressed positive thoughts and hopefulness on today's call. The patient is happy and was getting her hair done before Christmas. She has plans with her family at Christmas and is looking forward to this time. . Discussed plans with patient for ongoing care management follow up and provided patient with direct contact information for care management team . Reviewed with patient how she is handling stress/anxiety. Patient stating she has decreased some of her outside activities to assist her spouse more which has relieved some of her time commitments. The patient states that her husband is doing  better right now. She is not having do do daily infusions like she was and that has helped a lot. The patient is having to do a daily medication for him but she states it is nothing like before.  Education and support given. The patient talked about her strong faith.  06-09-2020: The patient is happy today and feels like she is in a good place. Looking forward to having her foot surgery to correct some problems she has been having with her right foot. Feels optimistic. Emotional support provided.   Patient Self Care Activities:   . Currently UNABLE TO independently manage stress and anxiety, self reported . Performs ADL's independently . Performs IADL's independently  Please see past updates related to this goal by clicking on the "Past Updates" button in the selected goal      .  RNCM: Pt- "I have not been taking my blood pressure" (pt-stated)        CARE PLAN ENTRY (see longtitudinal plan of care for additional care plan information)  Current Barriers:  . Chronic Disease Management support, education, and care coordination needs related to CAD, HTN, HLD, and chronic diarrhea  Clinical Goal(s) related to CAD, HTN, HLD, and Chronic Diarrhea :  Over the next 120 days, patient will:  . Work with the care management team to address educational, disease management, and care coordination needs  . Begin or continue self health monitoring activities as directed today Measure and record blood pressure 2 times per week and adhere to a heart healthy diet . Call provider office for new or worsened signs and symptoms Blood pressure findings outside established parameters and New or worsened symptom related to CAD/HLD/Diarrhea and other chronic conditions . Call care management team with questions or concerns . Verbalize basic understanding of patient centered plan of care established today  Interventions related to CAD, HTN, HLD, and Chronic Diarrhea :  . Evaluation of current treatment plans and patient's adherence to plan as established by provider.  08-17-2020: The patient endorses that she is doing well and has not new concerns with her health at this time. She is stable and not having any further issues with diarrhea.  . Assessed patient understanding of disease states.  The patient verbalized understanding of chronic conditions. The patient encouraged to step back and do some self care to help with her health and well being. 08-17-2020: The patient is managing her care and doing well. Positive response to medications and  changes in life circumstances. States medication regimen is working well for her and she denies any new concerns.  . Assessed patient's education and care coordination needs.  Education and support on ways to decrease stress in her life and take care of self while she is also the primary caregiver of her husband. 08-17-2020: Her foot surgery went well. She is back to work and very happy.  Review of SDOH, the patient denies any needs at this time. Will continue to monitor.   . Provided disease specific education to patient. Education on the benefits of taking blood pressures a couple of times a week. Blood pressure readings have been elevated recently.  Education on the risk of elevated blood pressure of heart attack and stroke. Encouraged the patient to really focus on self care and finding balance in life. 08-17-2020: Her blood pressures have stablized and she is doing well. She states she does not check her blood pressure often but when she does it is WNL.  She normally can tell when it  is elevated because of the way she feels. Denies any episodes of high blood pressures recently. Will continue to monitor for changes.  . Evaluation of dietary habits: The patient states she is adhering to a heart healthy diet. She feels her health has stabilized out at this time.  Nash Dimmer with appropriate clinical care team members regarding patient needs . Review of upcoming appointments. The patient does not see the pcp again until 09-30-2019 at 0820 am.  The RNCM plans to see the patient face to face on this day also.  The patient knows to call for changes or needs before next expected outreach.   Patient Self Care Activities related to CAD, HTN, HLD, and Chronic Diarrhea :  . Patient is unable to independently self-manage chronic health conditions  Please see past updates related to this goal by clicking on the "Past Updates" button in the selected goal         The patient verbalized understanding of  instructions, educational materials, and care plan provided today and declined offer to receive copy of patient instructions, educational materials, and care plan.   Face to Face appointment with care management team member scheduled for:  09-29-2020 at 0900 am  Noreene Larsson RN, MSN, [facility] Mobile: [phone]

## 2020-08-17 NOTE — Chronic Care Management (AMB) (Signed)
Chronic Care Management   Follow Up Note   08/17/2020 Name: Marie Parker MRN: [id number] DOB: April 08, 1946  Referred by: Valerie Roys, DO Reason for referral : Chronic Care Management (RNCM For Chronic Disease Management and Care Coordination Needs)   Marie Parker is a 74 y.o. year old female who is a primary care patient of Valerie Roys, DO. The CCM team was consulted for assistance with chronic disease management and care coordination needs.    Review of patient status, including review of consultants reports, relevant laboratory and other test results, and collaboration with appropriate care team members and the patient's provider was performed as part of comprehensive patient evaluation and provision of chronic care management services.    SDOH (Social Determinants of Health) assessments performed: Yes See Care Plan activities for detailed interventions related to [facility])     Outpatient Encounter Medications as of 08/17/2020  Medication Sig  . acetaminophen (TYLENOL) 650 MG CR tablet Take 1,300 mg by mouth daily as needed for pain.   [name] amitriptyline (ELAVIL) 25 MG tablet Take 1 tablet (25 mg total) by mouth at bedtime.  [name] amLODipine (NORVASC) 10 MG tablet Take 1 tablet (10 mg total) by mouth daily.  [name] aspirin 81 MG tablet Take 1 tablet (81 mg total) by mouth daily. (Patient taking differently: Take 81 mg by mouth at bedtime. )  . atenolol (TENORMIN) 50 MG tablet Take 1 tablet (50 mg total) by mouth daily.  [name] atorvastatin (LIPITOR) 40 MG tablet Take 1 tablet (40 mg total) by mouth daily. (Patient taking differently: Take 40 mg by mouth at bedtime. )  . benazepril (LOTENSIN) 40 MG tablet Take 1 tablet (40 mg total) by mouth daily.  [name] CALCIUM-VITAMIN D PO Take 2 tablets by mouth daily.  . clonazePAM (KLONOPIN) 0.5 MG tablet Take 0.5-1 tablets (0.25-0.5 mg total) by mouth 2 (two) times daily as needed for anxiety.  . dicyclomine (BENTYL) 10 MG capsule Take 10 mg by mouth  daily as needed for spasms.   . fluticasone (FLONASE) 50 MCG/ACT nasal spray Place 2 sprays into both nostrils daily. (Patient taking differently: Place 2 sprays into both nostrils daily as needed for allergies. )  . hydrALAZINE (APRESOLINE) 25 MG tablet Take 1 tablet (25 mg total) by mouth 2 (two) times daily.  [name] letrozole (FEMARA) 2.5 MG tablet TAKE 1 TABLET(2.5 MG) BY MOUTH DAILY (Patient taking differently: Take 2.5 mg by mouth daily. )  . Multiple Vitamin (MULTIVITAMIN WITH MINERALS) TABS tablet Take 1 tablet by mouth daily.  . niacin 500 MG tablet Take 1,000 mg by mouth at bedtime.  [name] omeprazole (PRILOSEC) 40 MG capsule TAKE 1 CAPSULE BY MOUTH  TWICE DAILY BEFORE A MEAL  . oxyCODONE-acetaminophen (PERCOCET) 5-325 MG tablet Take 1-2 tablets by mouth every 4 (four) hours as needed for severe pain.  . Probiotic Product (PROBIOTIC-10 PO) Take by mouth daily.   . sertraline (ZOLOFT) 100 MG tablet Take 1.5 tablets (150 mg total) by mouth daily.  . traMADol (ULTRAM) 50 MG tablet Take 50 mg by mouth every 6 (six) hours as needed for moderate pain.   . Turmeric 500 MG TABS Take 500 mg by mouth daily.    No facility-administered encounter medications on file as of 08/17/2020.     Objective:  BP Readings from Last 3 Encounters:  06/11/20 (!) 122/93  04/23/20 (!) 155/79  04/22/20 (!) 143/82    Goals Addressed  This Visit's Progress   .  RNCM- I am working towards decreasing my anxiety (pt-stated)        Current Barriers:  [name] Knowledge Deficits related to caregiver strain and reducing daily anxiety . Lacks caregiver support.   Nurse Case Manager Clinical Goal(s):  [name] Over the next 120 days, patient will work with [facility] to address needs related to reducing caregiver strain.  04-07-2020: The patients husband at this time is stable and the patients anxiety is better.  Still working with the patient on her anxiety level and maintaining a healthy mental status.  Interventions:   . Evaluation of current treatment plan related to anxiety/stress and patient's adherence to plan as established by provider. The patient has had an increase in her zoloft and her GI doctor started her on Amitriptyline.  The Amitriptyline had been working well. She had to take a half of a Klonopin today because she had gotten so worked up.  She stated that the GI doctor did not want her to use the Klonopin but she does not use it unless she absolutely has to do so.  The patient states she thinks a lot of it has to do with thinking in her mind that she has cancer and the colonoscopy it going to reveal this. Empathetic listening. 08-17-2020: The patient states that she is doing well. Had her surgery on her foot and is back to work. She denies any acute distress.  She states her surgery went well and she is doing very well.  . Advised patient to think about incorporating exercise into her daily routine, a healthy diet, and taking breaks and time for herself. Patient stating she was gifted a Armed forces technical officer and even though it is extra work she is thrilled to have a new dog. Discussed mindfulness exercises, writing in a journal, crafts, listening to music, taking time for self, reflection. The patient has a card game on her phone she plays and likes to color with an app she has on her phone. 06-09-2020: Reflective listening as the patient recounted the past year and the many things she has dealt with personally.  Praised the patient for being proactive in her care and utilization of resources to help her. She is thankful that her husband is doing so well. She told him now was the time for her to take care of him while she was down for a couple of months. 08-17-2020: The patient and her husband both are stable. She is thankful for her husbands health stabilizing and is happy that she is stable as well.  . Reviewed medications with patient and discussed increase in zoloft has been very helpful.  The patient saw pcp  recently for anxiety and her zoloft has been increased. 08-17-2020: the medication changes have been effective in managing her anxiety and depression. The patient expressed positive thoughts and hopefulness on today's call. The patient is happy and was getting her hair done before Christmas. She has plans with her family at Christmas and is looking forward to this time. . Discussed plans with patient for ongoing care management follow up and provided patient with direct contact information for care management team . Reviewed with patient how she is handling stress/anxiety. Patient stating she has decreased some of her outside activities to assist her spouse more which has relieved some of her time commitments. The patient states that her husband is doing better right now. She is not having do do daily infusions like she was and that has  helped a lot. The patient is having to do a daily medication for him but she states it is nothing like before.  Education and support given. The patient talked about her strong faith.  06-09-2020: The patient is happy today and feels like she is in a good place. Looking forward to having her foot surgery to correct some problems she has been having with her right foot. Feels optimistic. Emotional support provided.   Patient Self Care Activities:  . Currently UNABLE TO independently manage stress and anxiety, self reported . Performs ADL's independently . Performs IADL's independently  Please see past updates related to this goal by clicking on the "Past Updates" button in the selected goal      .  RNCM: Pt- "I have not been taking my blood pressure" (pt-stated)        CARE PLAN ENTRY (see longtitudinal plan of care for additional care plan information)  Current Barriers:  . Chronic Disease Management support, education, and care coordination needs related to CAD, HTN, HLD, and chronic diarrhea  Clinical Goal(s) related to CAD, HTN, HLD, and Chronic Diarrhea :   Over the next 120 days, patient will:  . Work with the care management team to address educational, disease management, and care coordination needs  . Begin or continue self health monitoring activities as directed today Measure and record blood pressure 2 times per week and adhere to a heart healthy diet . Call provider office for new or worsened signs and symptoms Blood pressure findings outside established parameters and New or worsened symptom related to CAD/HLD/Diarrhea and other chronic conditions . Call care management team with questions or concerns . Verbalize basic understanding of patient centered plan of care established today  Interventions related to CAD, HTN, HLD, and Chronic Diarrhea :  . Evaluation of current treatment plans and patient's adherence to plan as established by provider.  08-17-2020: The patient endorses that she is doing well and has not new concerns with her health at this time. She is stable and not having any further issues with diarrhea.  . Assessed patient understanding of disease states.  The patient verbalized understanding of chronic conditions. The patient encouraged to step back and do some self care to help with her health and well being. 08-17-2020: The patient is managing her care and doing well. Positive response to medications and changes in life circumstances. States medication regimen is working well for her and she denies any new concerns.  . Assessed patient's education and care coordination needs.  Education and support on ways to decrease stress in her life and take care of self while she is also the primary caregiver of her husband. 08-17-2020: Her foot surgery went well. She is back to work and very happy.  Review of SDOH, the patient denies any needs at this time. Will continue to monitor.   . Provided disease specific education to patient. Education on the benefits of taking blood pressures a couple of times a week. Blood pressure readings have  been elevated recently.  Education on the risk of elevated blood pressure of heart attack and stroke. Encouraged the patient to really focus on self care and finding balance in life. 08-17-2020: Her blood pressures have stablized and she is doing well. She states she does not check her blood pressure often but when she does it is WNL.  She normally can tell when it is elevated because of the way she feels. Denies any episodes of high blood pressures recently. Will  continue to monitor for changes.  . Evaluation of dietary habits: The patient states she is adhering to a heart healthy diet. She feels her health has stabilized out at this time.  Nash Dimmer with appropriate clinical care team members regarding patient needs . Review of upcoming appointments. The patient does not see the pcp again until 09-30-2019 at 0820 am.  The RNCM plans to see the patient face to face on this day also.  The patient knows to call for changes or needs before next expected outreach.   Patient Self Care Activities related to CAD, HTN, HLD, and Chronic Diarrhea :  . Patient is unable to independently self-manage chronic health conditions  Please see past updates related to this goal by clicking on the "Past Updates" button in the selected goal           Plan:   Face to Face appointment with care management team member scheduled for:  09-29-2020 at 0900.  The patient has an appointment with Dr. Wynetta Emery same day at [place] am.   Noreene Larsson RN, MSN, [facility] Mobile: [phone]

## 2020-09-03 DIAGNOSIS — D485 Neoplasm of uncertain behavior of skin: Secondary | ICD-10-CM | POA: Diagnosis not present

## 2020-09-03 DIAGNOSIS — L918 Other hypertrophic disorders of the skin: Secondary | ICD-10-CM | POA: Diagnosis not present

## 2020-09-06 ENCOUNTER — Other Ambulatory Visit: Payer: Medicare Other

## 2020-09-06 DIAGNOSIS — M2011 Hallux valgus (acquired), right foot: Secondary | ICD-10-CM | POA: Diagnosis not present

## 2020-09-07 ENCOUNTER — Ambulatory Visit
Admission: RE | Admit: 2020-09-07 | Discharge: 2020-09-07 | Disposition: A | Discharge status: Home / Self Care | Payer: Medicare Other | Source: Ambulatory Visit | Attending: General Surgery | Admitting: General Surgery

## 2020-09-07 ENCOUNTER — Other Ambulatory Visit: Payer: Self-pay

## 2020-09-07 DIAGNOSIS — M85852 Other specified disorders of bone density and structure, left thigh: Secondary | ICD-10-CM | POA: Diagnosis not present

## 2020-09-07 DIAGNOSIS — R29890 Loss of height: Secondary | ICD-10-CM | POA: Diagnosis not present

## 2020-09-07 DIAGNOSIS — M8589 Other specified disorders of bone density and structure, multiple sites: Secondary | ICD-10-CM | POA: Insufficient documentation

## 2020-09-07 DIAGNOSIS — Z13820 Encounter for screening for osteoporosis: Secondary | ICD-10-CM | POA: Insufficient documentation

## 2020-09-07 DIAGNOSIS — Z853 Personal history of malignant neoplasm of breast: Secondary | ICD-10-CM | POA: Diagnosis not present

## 2020-09-07 DIAGNOSIS — Z780 Asymptomatic menopausal state: Secondary | ICD-10-CM | POA: Insufficient documentation

## 2020-09-07 DIAGNOSIS — Z79811 Long term (current) use of aromatase inhibitors: Secondary | ICD-10-CM | POA: Insufficient documentation

## 2020-09-22 DIAGNOSIS — H2513 Age-related nuclear cataract, bilateral: Secondary | ICD-10-CM | POA: Diagnosis not present

## 2020-09-29 ENCOUNTER — Encounter: Payer: Self-pay | Admitting: Family Medicine

## 2020-09-29 ENCOUNTER — Ambulatory Visit (INDEPENDENT_AMBULATORY_CARE_PROVIDER_SITE_OTHER): Payer: Medicare Other | Admitting: Family Medicine

## 2020-09-29 ENCOUNTER — Telehealth: Payer: Self-pay

## 2020-09-29 ENCOUNTER — Other Ambulatory Visit: Payer: Self-pay

## 2020-09-29 ENCOUNTER — Telehealth: Payer: Self-pay | Admitting: General Practice

## 2020-09-29 VITALS — BP 133/80 | HR 70 | Temp 98.4°F | Wt 139.8 lb

## 2020-09-29 DIAGNOSIS — K219 Gastro-esophageal reflux disease without esophagitis: Secondary | ICD-10-CM

## 2020-09-29 DIAGNOSIS — R438 Other disturbances of smell and taste: Secondary | ICD-10-CM

## 2020-09-29 DIAGNOSIS — F411 Generalized anxiety disorder: Secondary | ICD-10-CM | POA: Diagnosis not present

## 2020-09-29 DIAGNOSIS — I129 Hypertensive chronic kidney disease with stage 1 through stage 4 chronic kidney disease, or unspecified chronic kidney disease: Secondary | ICD-10-CM

## 2020-09-29 DIAGNOSIS — M1A9XX0 Chronic gout, unspecified, without tophus (tophi): Secondary | ICD-10-CM

## 2020-09-29 DIAGNOSIS — E782 Mixed hyperlipidemia: Secondary | ICD-10-CM

## 2020-09-29 MED ORDER — CLONAZEPAM 0.5 MG PO TABS
0.2500 mg | ORAL_TABLET | Freq: Two times a day (BID) | ORAL | 0 refills | Status: DC | PRN
Start: 2020-09-29 — End: 2021-04-26

## 2020-09-29 MED ORDER — AMITRIPTYLINE HCL 25 MG PO TABS: 25.0000 mg | ORAL_TABLET | Freq: Every day | ORAL | 1 refills | Status: DC

## 2020-09-29 MED ORDER — ATENOLOL 50 MG PO TABS
50.0000 mg | ORAL_TABLET | Freq: Every day | ORAL | 1 refills | Status: DC
Start: 2020-09-29 — End: 2020-10-01

## 2020-09-29 MED ORDER — AMLODIPINE BESYLATE 10 MG PO TABS
10.0000 mg | ORAL_TABLET | Freq: Every day | ORAL | 1 refills | Status: DC
Start: 2020-09-29 — End: 2020-10-01

## 2020-09-29 MED ORDER — HYDRALAZINE HCL 25 MG PO TABS
25.0000 mg | ORAL_TABLET | Freq: Two times a day (BID) | ORAL | 1 refills | Status: DC
Start: 2020-09-29 — End: 2020-10-01

## 2020-09-29 MED ORDER — SERTRALINE HCL 100 MG PO TABS
150.0000 mg | ORAL_TABLET | Freq: Every day | ORAL | 1 refills | Status: DC
Start: 2020-09-29 — End: 2020-10-01

## 2020-09-29 MED ORDER — OMEPRAZOLE 40 MG PO CPDR: DELAYED_RELEASE_CAPSULE | ORAL | 1 refills | Status: DC

## 2020-09-29 MED ORDER — BENAZEPRIL HCL 40 MG PO TABS
40.0000 mg | ORAL_TABLET | Freq: Every day | ORAL | 1 refills | Status: DC
Start: 2020-09-29 — End: 2020-10-01

## 2020-09-29 MED ORDER — ATORVASTATIN CALCIUM 40 MG PO TABS
40.0000 mg | ORAL_TABLET | Freq: Every evening | ORAL | 1 refills | Status: DC
Start: 2020-09-29 — End: 2020-10-01

## 2020-09-29 NOTE — Progress Notes (Signed)
BP 133/80   Pulse 70   Temp 98.4 F (36.9 C)   Wt 139 lb 12.8 oz (63.4 kg)   SpO2 97%   BMI 28.24 kg/m    Subjective:    Patient ID: Marie Parker, female    DOB: 1945/11/28, 74 y.o.   MRN: [id number]  HPI: Marie Parker is a 75 y.o. female  Chief Complaint  Patient presents with  . Anxiety  . Hyperlipidemia  . Hypertension   HYPERTENSION / [place] Satisfied with current treatment? yes Duration of hypertension: chronic BP monitoring frequency: not checking BP medication side effects: no Duration of hyperlipidemia: chronic Cholesterol medication side effects: no Cholesterol supplements: none Past cholesterol medications: atorvastatin Medication compliance: excellent compliance Aspirin: yes Recent stressors: no Recurrent headaches: no Visual changes: no Palpitations: no Dyspnea: no Chest pain: no Lower extremity edema: no Dizzy/lightheaded: no  ANXIETY/STRESS Duration:stable Anxious mood: no  Excessive worrying: no Irritability: no  Sweating: no Nausea: no Palpitations:no Hyperventilation: no Panic attacks: no Agoraphobia: no  Obscessions/compulsions: no Depressed mood: no Depression screen [facility] 2/9 04/22/2020 04/05/2020 02/20/2020 01/23/2020 01/14/2020  Decreased Interest 0 0 1 2 2   Down, Depressed, Hopeless 0 0 2 0 0  PHQ - 2 Score 0 0 3 2 2   Altered sleeping 0 0 0 0 0  Tired, decreased energy 0 0 1 2 2   Change in appetite 0 0 0 1 1  Feeling bad or failure about yourself  0 0 0 2 2  Trouble concentrating 0 0 0 0 0  Moving slowly or fidgety/restless 0 0 0 2 2  Suicidal thoughts 0 0 0 0 0  PHQ-9 Score 0 0 4 9 9   Difficult doing work/chores Not difficult at all Not difficult at all Somewhat difficult Not difficult at all Not difficult at all  Some recent data might be hidden   Anhedonia: no Weight changes: no Insomnia: no   Hypersomnia: no Fatigue/loss of energy: no Feelings of worthlessness: no Feelings of guilt: no Impaired  concentration/indecisiveness: no Suicidal ideations: no  Crying spells: no Recent Stressors/Life Changes: no   Relationship problems: no   Family stress: no     Financial stress: no    Job stress: no    Recent death/loss: no   Relevant past medical, surgical, family and social history reviewed and updated as indicated. Interim medical history since our last visit reviewed. Allergies and medications reviewed and updated.  Review of Systems  Constitutional: Negative.   Respiratory: Negative.   Cardiovascular: Negative.   Gastrointestinal: Negative.   Musculoskeletal: Negative.   Psychiatric/Behavioral: Negative.     Per HPI unless specifically indicated above     Objective:    BP 133/80   Pulse 70   Temp 98.4 F (36.9 C)   Wt 139 lb 12.8 oz (63.4 kg)   SpO2 97%   BMI 28.24 kg/m   Wt Readings from Last 3 Encounters:  09/29/20 139 lb 12.8 oz (63.4 kg)  06/11/20 135 lb (61.2 kg)  06/08/20 135 lb (61.2 kg)    Physical Exam Vitals and nursing note reviewed.  Constitutional:      General: She is not in acute distress.    Appearance: Normal appearance. She is not ill-appearing, toxic-appearing or diaphoretic.  HENT:     Head: Normocephalic and atraumatic.     Right Ear: External ear normal.     Left Ear: External ear normal.     Nose: Nose normal.     Mouth/Throat:  Mouth: Mucous membranes are moist.     Pharynx: Oropharynx is clear.  Eyes:     General: No scleral icterus.       Right eye: No discharge.        Left eye: No discharge.     Extraocular Movements: Extraocular movements intact.     Conjunctiva/sclera: Conjunctivae normal.     Pupils: Pupils are equal, round, and reactive to light.  Cardiovascular:     Rate and Rhythm: Normal rate and regular rhythm.     Pulses: Normal pulses.     Heart sounds: Normal heart sounds. No murmur heard. No friction rub. No gallop.   Pulmonary:     Effort: Pulmonary effort is normal. No respiratory distress.      Breath sounds: Normal breath sounds. No stridor. No wheezing, rhonchi or rales.  Chest:     Chest wall: No tenderness.  Musculoskeletal:        General: Normal range of motion.     Cervical back: Normal range of motion and neck supple.  Skin:    General: Skin is warm and dry.     Capillary Refill: Capillary refill takes less than 2 seconds.     Coloration: Skin is not jaundiced or pale.     Findings: No bruising, erythema, lesion or rash.  Neurological:     General: No focal deficit present.     Mental Status: She is alert and oriented to person, place, and time. Mental status is at baseline.  Psychiatric:        Mood and Affect: Mood normal.        Behavior: Behavior normal.        Thought Content: Thought content normal.        Judgment: Judgment normal.     Results for orders placed or performed in visit on 09/29/20  Comprehensive metabolic panel  Result Value Ref Range   Glucose 96 65 - 99 mg/dL   BUN 10 8 - 27 mg/dL   Creatinine, Ser 0.88 0.57 - 1.00 mg/dL   GFR calc non Af Amer 65 >59 mL/min/1.73   GFR calc Af Amer 75 >59 mL/min/1.73   BUN/Creatinine Ratio 11 (L) 12 - 28   Sodium 144 134 - 144 mmol/L   Potassium 3.8 3.5 - 5.2 mmol/L   Chloride 103 96 - 106 mmol/L   CO2 26 20 - 29 mmol/L   Calcium 8.9 8.7 - 10.3 mg/dL   Total Protein 6.6 6.0 - 8.5 g/dL   Albumin 4.4 3.7 - 4.7 g/dL   Globulin, Total 2.2 1.5 - 4.5 g/dL   Albumin/Globulin Ratio 2.0 1.2 - 2.2   Bilirubin Total 0.3 0.0 - 1.2 mg/dL   Alkaline Phosphatase 107 44 - 121 IU/L   AST 29 0 - 40 IU/L   ALT 19 0 - 32 IU/L  Lipid Panel w/o Chol/HDL Ratio  Result Value Ref Range   Cholesterol, Total 140 100 - 199 mg/dL   Triglycerides 81 0 - 149 mg/dL   HDL 59 >39 mg/dL   VLDL Cholesterol Cal 16 5 - 40 mg/dL   LDL Chol Calc (NIH) 65 0 - 99 mg/dL  CBC with Differential/Platelet  Result Value Ref Range   WBC 5.5 3.4 - 10.8 x10E3/uL   RBC 4.10 3.77 - 5.28 x10E6/uL   Hemoglobin 11.8 11.1 - 15.9 g/dL    Hematocrit 36.1 34.0 - 46.6 %   MCV 88 79 - 97 fL   MCH 28.8 26.6 - 33.0  pg   MCHC 32.7 31.5 - 35.7 g/dL   RDW 13.4 11.7 - 15.4 %   Platelets 189 150 - 450 x10E3/uL   Neutrophils 64 Not Estab. %   Lymphs 23 Not Estab. %   Monocytes 8 Not Estab. %   Eos 5 Not Estab. %   Basos 0 Not Estab. %   Neutrophils Absolute 3.5 1.4 - 7.0 x10E3/uL   Lymphocytes Absolute 1.3 0.7 - 3.1 x10E3/uL   Monocytes Absolute 0.4 0.1 - 0.9 x10E3/uL   EOS (ABSOLUTE) 0.3 0.0 - 0.4 x10E3/uL   Basophils Absolute 0.0 0.0 - 0.2 x10E3/uL   Immature Granulocytes 0 Not Estab. %   Immature Grans (Abs) 0.0 0.0 - 0.1 x10E3/uL  Uric acid  Result Value Ref Range   Uric Acid 6.4 3.1 - 7.9 mg/dL      Assessment & Plan:   Problem List Items Addressed This Visit      Digestive   GERD (gastroesophageal reflux disease)    Under good control on current regimen. Continue current regimen. Continue to monitor. Call with any concerns. Refills given. Labs drawn today.          Genitourinary   Benign hypertensive renal disease - Primary    Under good control on current regimen. Continue current regimen. Continue to monitor. Call with any concerns. Refills given. Labs drawn today.        Relevant Orders   Comprehensive metabolic panel (Completed)   CBC with Differential/Platelet (Completed)     Other   Hyperlipidemia    Under good control on current regimen. Continue current regimen. Continue to monitor. Call with any concerns. Refills given. Labs drawn today.        Relevant Orders   Comprehensive metabolic panel (Completed)   Lipid Panel w/o Chol/HDL Ratio (Completed)   CBC with Differential/Platelet (Completed)   Gout    Under good control on current regimen. Continue current regimen. Continue to monitor. Call with any concerns. Refills given. Labs drawn today.        Relevant Orders   Uric acid (Completed)   GAD (generalized anxiety disorder)    Under good control on current regimen. Continue current  regimen. Continue to monitor. Call with any concerns. Refills given. Labs drawn today.        Relevant Orders   Comprehensive metabolic panel (Completed)   CBC with Differential/Platelet (Completed)    Other Visit Diagnoses    Generalized anxiety disorder       Bad taste in mouth       Advised following up with dentistry.         Follow up plan: Return in about 6 months (around 03/29/2021) for Physical.

## 2020-09-29 NOTE — Telephone Encounter (Signed)
Are you able to resend prescriptions to [facility] except for clonazepam that goes to [facility].

## 2020-09-29 NOTE — Telephone Encounter (Signed)
  Chronic Care Management   Outreach Note  09/29/2020 Name: Marie Parker MRN: [id number] DOB: September 19, 1945  Referred by: Valerie Roys, DO Reason for referral : Appointment (RNCM: Follow up for Chronic Disease Management and Care Coordination Needs)   An unsuccessful telephone outreach was attempted today. The patient was referred to the case management team for assistance with care management and care coordination. The patient answered but she was in [place] and states she would have a dropped call. The patient is doing well and saw pcp this am. Ask for a reschedule. Denies any needs.   Follow Up Plan: Telephone follow up appointment with care management team member scheduled for: 12-01-2020 at 0900 am Noreene Larsson RN, MSN, [facility] Mobile: [phone]

## 2020-09-29 NOTE — Telephone Encounter (Signed)
Copied from [place] [phone]. Topic: General - Other >> Sep 29, 2020 10:45 AM Leward Quan A wrote: Reason for CRM: Patient called in to inform Dr Wynetta Emery that the only medication that needed to go to [facility] is the clonazePAM (KLONOPIN) 0.5 MG tablet say that all the rest need to go to [place], [place], [address]  Phone:  [phone] Fax:  [phone]

## 2020-09-30 LAB — COMPREHENSIVE METABOLIC PANEL
ALT: 19 IU/L (ref 0–32)
AST: 29 IU/L (ref 0–40)
Albumin/Globulin Ratio: 2 (ref 1.2–2.2)
Albumin: 4.4 g/dL (ref 3.7–4.7)
Alkaline Phosphatase: 107 IU/L (ref 44–121)
BUN/Creatinine Ratio: 11 — ABNORMAL LOW (ref 12–28)
BUN: 10 mg/dL (ref 8–27)
Bilirubin Total: 0.3 mg/dL (ref 0.0–1.2)
CO2: 26 mmol/L (ref 20–29)
Calcium: 8.9 mg/dL (ref 8.7–10.3)
Chloride: 103 mmol/L (ref 96–106)
Creatinine, Ser: 0.88 mg/dL (ref 0.57–1.00)
GFR calc Af Amer: 75 mL/min/{1.73_m2} (ref >59)
GFR calc non Af Amer: 65 mL/min/{1.73_m2} (ref >59)
Globulin, Total: 2.2 g/dL (ref 1.5–4.5)
Glucose: 96 mg/dL (ref 65–99)
Potassium: 3.8 mmol/L (ref 3.5–5.2)
Sodium: 144 mmol/L (ref 134–144)
Total Protein: 6.6 g/dL (ref 6.0–8.5)

## 2020-09-30 LAB — LIPID PANEL W/O CHOL/HDL RATIO
Cholesterol, Total: 140 mg/dL (ref 100–199)
HDL: 59 mg/dL (ref >39)
LDL Chol Calc (NIH): 65 mg/dL (ref 0–99)
Triglycerides: 81 mg/dL (ref 0–149)
VLDL Cholesterol Cal: 16 mg/dL (ref 5–40)

## 2020-09-30 LAB — CBC WITH DIFFERENTIAL/PLATELET
Basophils Absolute: 0 10*3/uL (ref 0.0–0.2)
Basos: 0 %
EOS (ABSOLUTE): 0.3 10*3/uL (ref 0.0–0.4)
Eos: 5 %
Hematocrit: 36.1 % (ref 34.0–46.6)
Hemoglobin: 11.8 g/dL (ref 11.1–15.9)
Immature Grans (Abs): 0 10*3/uL (ref 0.0–0.1)
Immature Granulocytes: 0 %
Lymphocytes Absolute: 1.3 10*3/uL (ref 0.7–3.1)
Lymphs: 23 %
MCH: 28.8 pg (ref 26.6–33.0)
MCHC: 32.7 g/dL (ref 31.5–35.7)
MCV: 88 fL (ref 79–97)
Monocytes Absolute: 0.4 10*3/uL (ref 0.1–0.9)
Monocytes: 8 %
Neutrophils Absolute: 3.5 10*3/uL (ref 1.4–7.0)
Neutrophils: 64 %
Platelets: 189 10*3/uL (ref 150–450)
RBC: 4.1 x10E6/uL (ref 3.77–5.28)
RDW: 13.4 % (ref 11.7–15.4)
WBC: 5.5 10*3/uL (ref 3.4–10.8)

## 2020-09-30 LAB — URIC ACID: Uric Acid: 6.4 mg/dL (ref 3.1–7.9)

## 2020-10-01 ENCOUNTER — Telehealth: Payer: Self-pay

## 2020-10-01 MED ORDER — ATENOLOL 50 MG PO TABS
50.0000 mg | ORAL_TABLET | Freq: Every day | ORAL | 1 refills | Status: DC
Start: 2020-10-01 — End: 2021-04-26

## 2020-10-01 MED ORDER — OMEPRAZOLE 40 MG PO CPDR: DELAYED_RELEASE_CAPSULE | ORAL | 1 refills | Status: DC

## 2020-10-01 MED ORDER — FLUTICASONE PROPIONATE 50 MCG/ACT NA SUSP
2.0000 | Freq: Every day | NASAL | 12 refills | Status: DC
Start: 2020-10-01 — End: 2021-10-25

## 2020-10-01 MED ORDER — SERTRALINE HCL 100 MG PO TABS
150.0000 mg | ORAL_TABLET | Freq: Every day | ORAL | 1 refills | Status: DC
Start: 2020-10-01 — End: 2021-03-25

## 2020-10-01 MED ORDER — AMLODIPINE BESYLATE 10 MG PO TABS
10.0000 mg | ORAL_TABLET | Freq: Every day | ORAL | 1 refills | Status: DC
Start: 2020-10-01 — End: 2021-04-26

## 2020-10-01 MED ORDER — ATORVASTATIN CALCIUM 40 MG PO TABS
40.0000 mg | ORAL_TABLET | Freq: Every evening | ORAL | 1 refills | Status: DC
Start: 2020-10-01 — End: 2021-04-26

## 2020-10-01 MED ORDER — BENAZEPRIL HCL 40 MG PO TABS
40.0000 mg | ORAL_TABLET | Freq: Every day | ORAL | 1 refills | Status: DC
Start: 2020-10-01 — End: 2021-04-26

## 2020-10-01 MED ORDER — AMITRIPTYLINE HCL 25 MG PO TABS: 25.0000 mg | ORAL_TABLET | Freq: Every day | ORAL | 1 refills | Status: DC

## 2020-10-01 MED ORDER — HYDRALAZINE HCL 25 MG PO TABS
25.0000 mg | ORAL_TABLET | Freq: Two times a day (BID) | ORAL | 1 refills | Status: DC
Start: 2020-10-01 — End: 2021-04-26

## 2020-10-04 NOTE — Assessment & Plan Note (Signed)
Under good control on current regimen. Continue current regimen. Continue to monitor. Call with any concerns. Refills given. Labs drawn today.   

## 2020-10-12 DIAGNOSIS — H25813 Combined forms of age-related cataract, bilateral: Secondary | ICD-10-CM | POA: Diagnosis not present

## 2020-10-18 DIAGNOSIS — H43811 Vitreous degeneration, right eye: Secondary | ICD-10-CM | POA: Diagnosis not present

## 2020-10-18 DIAGNOSIS — H2513 Age-related nuclear cataract, bilateral: Secondary | ICD-10-CM | POA: Diagnosis not present

## 2020-10-21 ENCOUNTER — Ambulatory Visit (INDEPENDENT_AMBULATORY_CARE_PROVIDER_SITE_OTHER): Payer: Medicare Other | Admitting: Licensed Clinical Social Worker

## 2020-10-21 DIAGNOSIS — E782 Mixed hyperlipidemia: Secondary | ICD-10-CM

## 2020-10-21 DIAGNOSIS — F411 Generalized anxiety disorder: Secondary | ICD-10-CM

## 2020-10-21 DIAGNOSIS — F419 Anxiety disorder, unspecified: Secondary | ICD-10-CM

## 2020-10-21 NOTE — Chronic Care Management (AMB) (Signed)
Chronic Care Management    Clinical Social Work Note  10/21/2020 Name: Marie Parker MRN: [id number] DOB: 02-19-1946  [name] is a 75 y.o. year old female who is a primary care patient of Valerie Roys, DO. The CCM team was consulted to assist the patient with chronic disease management and/or care coordination needs related to: Mental Health Counseling and Resources.   Engaged with patient by telephone for follow up visit in response to provider referral for social work chronic care management and care coordination services.   Consent to Services:  The patient was given the following information about Chronic Care Management services today, agreed to services, and gave verbal consent: 1. CCM service includes personalized support from designated clinical staff supervised by the primary care provider, including individualized plan of care and coordination with other care providers 2. 24/7 contact phone numbers for assistance for urgent and routine care needs. 3. Service will only be billed when office clinical staff spend 20 minutes or more in a month to coordinate care. 4. Only one practitioner may furnish and bill the service in a calendar month. 5.The patient may stop CCM services at any time (effective at the end of the month) by phone call to the office staff. 6. The patient will be responsible for cost sharing (co-pay) of up to 20% of the service fee (after annual deductible is met). Patient agreed to services and consent obtained.  Patient agreed to services and consent obtained.   Assessment: Review of patient past medical history, allergies, medications, and health status, including review of relevant consultants reports was performed today as part of a comprehensive evaluation and provision of chronic care management and care coordination services.     SDOH (Social Determinants of Health) assessments and interventions performed:    Advanced Directives Status: See Care Plan  for related entries.  CCM Care Plan  Allergies  Allergen Reactions  . Alendronate Nausea Only and Other (See Comments)    Other reaction(s): NAUSEA GI upset  . Amoxicillin-Pot Clavulanate Diarrhea  . Crestor [Rosuvastatin] Nausea Only  . Cyclobenzaprine Other (See Comments)    Vertigo, couldn't focus eyes  . Erythromycin Other (See Comments)    GI upset   . Fosamax [Alendronate Sodium] Nausea And Vomiting    Outpatient Encounter Medications as of 10/21/2020  Medication Sig  . acetaminophen (TYLENOL) 650 MG CR tablet Take 1,300 mg by mouth daily as needed for pain.   [name] amitriptyline (ELAVIL) 25 MG tablet Take 1 tablet (25 mg total) by mouth at bedtime.  [name] amLODipine (NORVASC) 10 MG tablet Take 1 tablet (10 mg total) by mouth daily.  [name] aspirin 81 MG tablet Take 1 tablet (81 mg total) by mouth daily. (Patient taking differently: Take 81 mg by mouth at bedtime.)  . atenolol (TENORMIN) 50 MG tablet Take 1 tablet (50 mg total) by mouth daily.  [name] atorvastatin (LIPITOR) 40 MG tablet Take 1 tablet (40 mg total) by mouth at bedtime.  . benazepril (LOTENSIN) 40 MG tablet Take 1 tablet (40 mg total) by mouth daily.  [name] CALCIUM-VITAMIN D PO Take 2 tablets by mouth daily.  . clonazePAM (KLONOPIN) 0.5 MG tablet Take 0.5-1 tablets (0.25-0.5 mg total) by mouth 2 (two) times daily as needed for anxiety.  . fluticasone (FLONASE) 50 MCG/ACT nasal spray Place 2 sprays into both nostrils daily.  . hydrALAZINE (APRESOLINE) 25 MG tablet Take 1 tablet (25 mg total) by mouth 2 (two) times daily.  [name] letrozole (FEMARA) 2.5 MG tablet  TAKE 1 TABLET(2.5 MG) BY MOUTH DAILY (Patient taking differently: Take 2.5 mg by mouth daily.)  . Multiple Vitamin (MULTIVITAMIN WITH MINERALS) TABS tablet Take 1 tablet by mouth daily.  . niacin 500 MG tablet Take 1,000 mg by mouth at bedtime.  [name] omeprazole (PRILOSEC) 40 MG capsule TAKE 1 CAPSULE BY MOUTH  TWICE DAILY BEFORE A MEAL  . Probiotic Product (PROBIOTIC-10 PO) Take by  mouth daily.   . sertraline (ZOLOFT) 100 MG tablet Take 1.5 tablets (150 mg total) by mouth daily.  . traMADol (ULTRAM) 50 MG tablet Take 50 mg by mouth every 6 (six) hours as needed for moderate pain.   . Turmeric 500 MG TABS Take 500 mg by mouth daily.    No facility-administered encounter medications on file as of 10/21/2020.    Patient Active Problem List   Diagnosis Date Noted  . History of colonic polyps   . Morton's neuroma of right foot 11/28/2019  . Osteoarthritis of elbow 11/28/2019  . Bunion 09/09/2019  . Right elbow pain 09/05/2019  . Osteopenia 06/17/2018  . Malignant neoplasm of overlapping sites of left female breast ([place]) 04/10/2018  . Liver cyst 02/22/2018  . Senile purpura ([place]) 12/27/2017  . GAD (generalized anxiety disorder) 06/21/2016  . Personal history of tobacco use, presenting hazards to health 02/02/2016  . Multiple pulmonary nodules determined by computed tomography of lung 12/21/2015  . Benign hypertensive renal disease 06/22/2015  . Hyperlipidemia 06/22/2015  . Gout 06/22/2015  . Lichen sclerosus 49/70/2637  . Lower abdominal pain 05/31/2013  . GERD (gastroesophageal reflux disease) 01/22/2013  . Peripheral vascular disease ([place]) 01/22/2013  . CAD (coronary artery disease) 01/22/2013    Conditions to be addressed/monitored: Anxiety and Depression; Mental Health Concerns   Care Plan : General Social Work (Adult)  Updates made by Greg Cutter, LCSW since 10/21/2020 12:00 AM    Problem: SW- I want to continue taking good care of myself   Priority: Medium  Onset Date: 07/26/2020    Long-Range Goal: Anxiety Symptoms Monitored and Managed   Start Date: 10/21/2020  Priority: Medium  Note:   Evidence-based guidance:   Discuss adherence to medication therapy; assess and manage barriers, such as costs, side effects, feeling little or no improvement, stigma or low motivation.   Discuss past experiences with psychotropic medication, potential side  effects and need to continue medication until treatment becomes effective (e.g., 4 to 8 weeks).   Explain the interplay of stress and physical symptoms, as well as the relationship between illness and emotional problems.   Explore complementary and alternative therapy options, such as applied relaxation, mindfulness, meditation, music therapy, aromatherapy, acupuncture or massage.   Prepare patient for antidepressant pharmacologic therapy which may include additional short-term medications until maintenance medications demonstrate therapeutic effect.    Assess response; monitor and manage side effects.   Prepare patient for use of pharmacologic therapy (e.g., anxiolytic, selective serotonin-reuptake inhibitor, serotonin norepinephrine-reuptake inhibitor, tricyclic antidepressant, antiepileptic, antipsychotic for comorbid depression,    phosphodiesterase-inhibitor for sexual dysfunction, sedative or hypnotic for sleep disturbance); monitor and manage side effects.   Prepare patient for long-term pharmacologic therapy to prevent relapse (e.g., 12 months after symptom improvement).   Promote cognitive behavioral therapy, individualized to severity of symptoms [e.g., nonfacilitated self-help, facilitated (computerized or manual-based) self-help, face-to-face individual treatment].   Encourage patient to develop a self-management plan that identifies and manages lifestyle triggers (e.g., caffeine, stimulants, nicotine, stress), improves sleep, exercise or physical activity based on tolerance.   Prepare patient  for a referral to a psychiatrist when patient has a poor response to treatment, atypical presentation or comorbid psychiatric disorder.   Provide frequent follow-up (e.g., every 2 weeks for the first 6 to 8 weeks of treatment and monthly thereafter).   Explore means to support work reintegration, such as modified working hours, peer support or coaching or a community jobs program.   Notes:     Timeframe:  Long-Range Goal Priority:  Medium  Start Date:  10/21/20                         Expected End Date:  01/18/21                    Follow Up Date- 12/09/20  Current Barriers:  . Financial constraints . Limited social support . Level of care concerns . Mental Health Concerns  . Social Isolation . Limited access to caregiver- in terms of spouse . Lacks knowledge of community resource: available personal care service resources and [facility] resource connection within the area  Clinical Social Work Clinical Goal(s):  [name] Over the next 90 days, client will work with SW to address concerns related to reducing anxiety and improving self-care  Interventions: . Patient interviewed and appropriate assessments performed . UPDATE 10/21/20- Patient declines any recent falls. She reports that she is managing her anxiety extremely well at this time and has not had to take anxiety medication that was increased in July of 2021 but twice.  . Patient reports that she had a successful foot surgery procedure and is recovery well.  . She reports that her spouse has prostate cancer and has several upcoming follow up appointments for this but reports that things are stable at this time.  . Provided mental health counseling with regard to managing anxiety. Educated patient on coping methods to implement into his daily life to combat depressive symptoms and stress. Patient denied any suicidal or homicidal ideations. Encouraged patient to implement deep breathing exercises into her daily routine due to ongoing anxiety. Patient reports having therapy in the past but denies wanting LCSW to make a referral for counseling. Patient was appreciative of the offer but wishes to manage her care without mental health therapy at this time. . Patient reports that since her Zoloft medication was increased by 50 mg, she is able to manage her depressive symptoms a lot more effectively. Patient is currently on 150 mg of Zoloft.  Patient is also on amitriptyline. Patient reports that both of these medications have helped to alleviate her anxiety and depression. PAST UPDATE 07/26/20-Patient reports that her overall anxiety has decreased. . Provided patient with information about available personal care service resources within the area. . CSW used motivational interviewing techniques to engage pt in meaningful conversation and to assess pt's wilingess to improve self-care and decrease her caregiver burnout symptoms. CSW explored pt's coping skills and access to community services.  . Patient is agreeable to start implementing deep breathing exercises into her daily routine to combat anxiety. She reports that she is currently doing word puzzles and games to occupy her time and improve her brain functioning. Positive reinforcement provided.  [name]  LCSW provided reflective listening and implemented appropriate interventions to help suppport patient and her emotional needs  . Patient was receptive to emotional support provided during session . Discussed plans with patient for ongoing care management follow up and provided patient with direct contact information for care management team . Advised patient  to contact CCM program for any urgent needs. . Assisted patient/caregiver with obtaining information about health plan benefits . In the past, patient reported that she sometimes feels "jittery" which may be a result of her medications but reports that she is able to effectively work through this when it arises. Patient reports "it doesn't last long."  . Provided education to patient/caregiver regarding level of care options. . Patient reports that she visited her surgeon today and no longer has to wear her boot. Patient reports her her foot is healing well.  . Patient reports that overall she is doing better and is gaining much needed socialization to combat her anxiety. Patient reports that she is currently out with her aunt and that  they plan to go out to eat and get their hair done together. Positive reinforcement provided for self-care and socialization.  . Patient continues to work part time at [employer] where she receives active socialization.   Patient Self Care Activities:  . Attends all scheduled provider appointments . Calls provider office for new concerns or questions  Please see past updates related to this goal by clicking on the "Past Updates" button in the selected goal         Follow Up Plan: SW will follow up with patient by phone over the next quarter      Eula Fried, [place], MSW, [email or web address] Phone: [phone]

## 2020-11-23 DIAGNOSIS — I700 Atherosclerosis of aorta: Secondary | ICD-10-CM | POA: Insufficient documentation

## 2020-11-24 DIAGNOSIS — M19071 Primary osteoarthritis, right ankle and foot: Secondary | ICD-10-CM | POA: Diagnosis not present

## 2020-11-24 DIAGNOSIS — M79671 Pain in right foot: Secondary | ICD-10-CM | POA: Diagnosis not present

## 2020-12-01 ENCOUNTER — Ambulatory Visit (INDEPENDENT_AMBULATORY_CARE_PROVIDER_SITE_OTHER): Payer: Medicare Other | Admitting: General Practice

## 2020-12-01 ENCOUNTER — Telehealth: Payer: Self-pay | Admitting: General Practice

## 2020-12-01 DIAGNOSIS — I10 Essential (primary) hypertension: Secondary | ICD-10-CM | POA: Diagnosis not present

## 2020-12-01 DIAGNOSIS — I2510 Atherosclerotic heart disease of native coronary artery without angina pectoris: Secondary | ICD-10-CM

## 2020-12-01 DIAGNOSIS — F411 Generalized anxiety disorder: Secondary | ICD-10-CM

## 2020-12-01 DIAGNOSIS — H2513 Age-related nuclear cataract, bilateral: Secondary | ICD-10-CM | POA: Diagnosis not present

## 2020-12-01 DIAGNOSIS — E782 Mixed hyperlipidemia: Secondary | ICD-10-CM

## 2020-12-01 DIAGNOSIS — H43811 Vitreous degeneration, right eye: Secondary | ICD-10-CM | POA: Diagnosis not present

## 2020-12-01 NOTE — Patient Instructions (Signed)
Visit Information  PATIENT GOALS: Goals Addressed              This Visit's Progress   .  COMPLETED: RNCM- I am working towards decreasing my anxiety (pt-stated)        Current Barriers: Closing goal and opening in ELS . Knowledge Deficits related to caregiver strain and reducing daily anxiety . Lacks caregiver support.   Nurse Case Manager Clinical Goal(s):  [name] Over the next 120 days, patient will work with [facility] to address needs related to reducing caregiver strain.  04-07-2020: The patients husband at this time is stable and the patients anxiety is better.  Still working with the patient on her anxiety level and maintaining a healthy mental status.  Interventions:  . Evaluation of current treatment plan related to anxiety/stress and patient's adherence to plan as established by provider. The patient has had an increase in her zoloft and her GI doctor started her on Amitriptyline.  The Amitriptyline had been working well. She had to take a half of a Klonopin today because she had gotten so worked up.  She stated that the GI doctor did not want her to use the Klonopin but she does not use it unless she absolutely has to do so.  The patient states she thinks a lot of it has to do with thinking in her mind that she has cancer and the colonoscopy it going to reveal this. Empathetic listening. 08-17-2020: The patient states that she is doing well. Had her surgery on her foot and is back to work. She denies any acute distress.  She states her surgery went well and she is doing very well.  . Advised patient to think about incorporating exercise into her daily routine, a healthy diet, and taking breaks and time for herself. 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 Discussed mindfulness exercises, writing in a journal, crafts, listening to music, taking time for self, reflection. The patient has a card game on her phone she plays and  likes to color with an app she has on her phone. 06-09-2020: Reflective listening as the patient recounted the past year and the many things she has dealt with personally.  Praised the patient for being proactive in her care and utilization of resources to help her. She is thankful that her husband is doing so well. She told him now was the time for her to take care of him while she was down for a couple of months. 08-17-2020: The patient and her husband both are stable. She is thankful for her husbands health stabilizing and is happy that she is stable as well.  . Reviewed medications with patient and discussed increase in zoloft has been very helpful.  The patient saw pcp recently for anxiety and her zoloft has been increased. 08-17-2020: the medication changes have been effective in managing her anxiety and depression. The patient expressed positive thoughts and hopefulness on today's call. The patient is happy and was getting her hair done before Christmas. She has plans with her family at Christmas and is looking forward to this time. . Discussed plans with patient for ongoing care management follow up and provided patient with direct contact information for care management team . Reviewed with patient how she is handling stress/anxiety. Patient stating she has decreased some of her outside activities to assist her spouse more which has relieved some of her time commitments.  The patient states that her husband is doing better right now. She is not having do do daily infusions like she was and that has helped a lot. The patient is having to do a daily medication for him but she states it is nothing like before.  Education and support given. The patient talked about her strong faith.  06-09-2020: The patient is happy today and feels like she is in a good place. Looking forward to having her foot surgery to correct some problems she has been having with her right foot. Feels optimistic. Emotional support  provided.   Patient Self Care Activities:  . Currently UNABLE TO independently manage stress and anxiety, self reported . Performs ADL's independently . Performs IADL's independently  Please see past updates related to this goal by clicking on the "Past Updates" button in the selected goal      .  COMPLETED: RNCM: Pt- "I have not been taking my blood pressure" (pt-stated)        CARE PLAN ENTRY (see longtitudinal plan of care for additional care plan information)  Current Barriers: Closing this goal and opening in new ELS . Chronic Disease Management support, education, and care coordination needs related to CAD, HTN, HLD, and chronic diarrhea  Clinical Goal(s) related to CAD, HTN, HLD, and Chronic Diarrhea :  Over the next 120 days, patient will:  . Work with the care management team to address educational, disease management, and care coordination needs  . Begin or continue self health monitoring activities as directed today Measure and record blood pressure 2 times per week and adhere to a heart healthy diet . Call provider office for new or worsened signs and symptoms Blood pressure findings outside established parameters and New or worsened symptom related to CAD/HLD/Diarrhea and other chronic conditions . Call care management team with questions or concerns . Verbalize basic understanding of patient centered plan of care established today  Interventions related to CAD, HTN, HLD, and Chronic Diarrhea :  . Evaluation of current treatment plans and patient's adherence to plan as established by provider.  08-17-2020: The patient endorses that she is doing well and has not new concerns with her health at this time. She is stable and not having any further issues with diarrhea.  . Assessed patient understanding of disease states.  The patient verbalized understanding of chronic conditions. The patient encouraged to step back and do some self care to help with her health and well being.  08-17-2020: The patient is managing her care and doing well. Positive response to medications and changes in life circumstances. States medication regimen is working well for her and she denies any new concerns.  . Assessed patient's education and care coordination needs.  Education and support on ways to decrease stress in her life and take care of self while she is also the primary caregiver of her husband. 08-17-2020: Her foot surgery went well. She is back to work and very happy.  Review of SDOH, the patient denies any needs at this time. Will continue to monitor.   . Provided disease specific education to patient. Education on the benefits of taking blood pressures a couple of times a week. Blood pressure readings have been elevated recently.  Education on the risk of elevated blood pressure of heart attack and stroke. Encouraged the patient to really focus on self care and finding balance in life. 08-17-2020: Her blood pressures have stablized and she is doing well. She states she does not check her blood  pressure often but when she does it is WNL.  She normally can tell when it is elevated because of the way she feels. Denies any episodes of high blood pressures recently. Will continue to monitor for changes.  . Evaluation of dietary habits: The patient states she is adhering to a heart healthy diet. She feels her health has stabilized out at this time.  Nash Dimmer with appropriate clinical care team members regarding patient needs . Review of upcoming appointments. The patient does not see the pcp again until 09-30-2019 at 0820 am.  The RNCM plans to see the patient face to face on this day also.  The patient knows to call for changes or needs before next expected outreach.   Patient Self Care Activities related to CAD, HTN, HLD, and Chronic Diarrhea :  . Patient is unable to independently self-manage chronic health conditions  Please see past updates related to this goal by clicking on the "Past  Updates" button in the selected goal      .  RNCM: Track and Manage My Symptoms-Depression        Timeframe:  Long-Range Goal Priority:  High Start Date:                             Expected End Date:   12-01-2021                    Follow Up Date 02/09/2021   - avoid negative self-talk - develop a personal safety plan - develop a plan to deal with triggers like holidays, anniversaries - exercise at least 2 to 3 times per week - have a plan for how to handle bad days - journal feelings and what helps to feel better or worse - spend time or talk with others at least 2 to 3 times per week - spend time or talk with others every day - watch for early signs of feeling worse    Why is this important?    Keeping track of your progress will help your treatment team find the right mix of medicine and therapy for you.   Write in your journal every day.   Day-to-day changes in depression symptoms are normal. It may be more helpful to check your progress at the end of each week instead of every day.     Notes: The patient states that her depression and anxiety are stable.The patient is doing better since her husband is stable. She states since the last RNCM call she has only had to use 2 of her anxiety pills.      Patient Care Plan: General Social Work (Adult)    Problem Identified: SW- I want to continue taking good care of myself   Priority: Medium  Onset Date: 07/26/2020    Long-Range Goal: Anxiety Symptoms Monitored and Managed   Start Date: 10/21/2020  Priority: Medium  Note:   Evidence-based guidance:   Discuss adherence to medication therapy; assess and manage barriers, such as costs, side effects, feeling little or no improvement, stigma or low motivation.   Discuss past experiences with psychotropic medication, potential side effects and need to continue medication until treatment becomes effective (e.g., 4 to 8 weeks).   Explain the interplay of stress and physical  symptoms, as well as the relationship between illness and emotional problems.   Explore complementary and alternative therapy options, such as applied relaxation, mindfulness, meditation, music therapy, aromatherapy, acupuncture or  massage.   Prepare patient for antidepressant pharmacologic therapy which may include additional short-term medications until maintenance medications demonstrate therapeutic effect.    Assess response; monitor and manage side effects.   Prepare patient for use of pharmacologic therapy (e.g., anxiolytic, selective serotonin-reuptake inhibitor, serotonin norepinephrine-reuptake inhibitor, tricyclic antidepressant, antiepileptic, antipsychotic for comorbid depression,    phosphodiesterase-inhibitor for sexual dysfunction, sedative or hypnotic for sleep disturbance); monitor and manage side effects.   Prepare patient for long-term pharmacologic therapy to prevent relapse (e.g., 12 months after symptom improvement).   Promote cognitive behavioral therapy, individualized to severity of symptoms [e.g., nonfacilitated self-help, facilitated (computerized or manual-based) self-help, face-to-face individual treatment].   Encourage patient to develop a self-management plan that identifies and manages lifestyle triggers (e.g., caffeine, stimulants, nicotine, stress), improves sleep, exercise or physical activity based on tolerance.   Prepare patient for a referral to a psychiatrist when patient has a poor response to treatment, atypical presentation or comorbid psychiatric disorder.   Provide frequent follow-up (e.g., every 2 weeks for the first 6 to 8 weeks of treatment and monthly thereafter).   Explore means to support work reintegration, such as modified working hours, peer support or coaching or a community jobs program.   Notes:    Timeframe:  Long-Range Goal Priority:  Medium  Start Date:  10/21/20                         Expected End Date:  01/18/21                     Follow Up Date- 12/09/20  Current Barriers:  . Financial constraints . Limited social support . Level of care concerns . Mental Health Concerns  . Social Isolation . Limited access to caregiver- in terms of spouse . Lacks knowledge of community resource: available personal care service resources and [facility] resource connection within the area  Clinical Social Work Clinical Goal(s):  [name] Over the next 90 days, client will work with SW to address concerns related to reducing anxiety and improving self-care  Interventions: . Patient interviewed and appropriate assessments performed . UPDATE 10/21/20- Patient declines any recent falls. She reports that she is managing her anxiety extremely well at this time and has not had to take anxiety medication that was increased in July of 2021 but twice.  . Patient reports that she had a successful foot surgery procedure and is recovery well.  . She reports that her spouse has prostate cancer and has several upcoming follow up appointments for this but reports that things are stable at this time.  . Provided mental health counseling with regard to managing anxiety. Educated patient on coping methods to implement into his daily life to combat depressive symptoms and stress. Patient denied any suicidal or homicidal ideations. Encouraged patient to implement deep breathing exercises into her daily routine due to ongoing anxiety. Patient reports having therapy in the past but denies wanting LCSW to make a referral for counseling. Patient was appreciative of the offer but wishes to manage her care without mental health therapy at this time. . Patient reports that since her Zoloft medication was increased by 50 mg, she is able to manage her depressive symptoms a lot more effectively. Patient is currently on 150 mg of Zoloft. Patient is also on amitriptyline. Patient reports that both of these medications have helped to alleviate her anxiety and depression. PAST UPDATE  07/26/20-Patient reports that her overall anxiety has decreased. [name]  Provided patient with information about available personal care service resources within the area. . CSW used motivational interviewing techniques to engage pt in meaningful conversation and to assess pt's wilingess to improve self-care and decrease her caregiver burnout symptoms. CSW explored pt's coping skills and access to community services.  . Patient is agreeable to start implementing deep breathing exercises into her daily routine to combat anxiety. She reports that she is currently doing word puzzles and games to occupy her time and improve her brain functioning. Positive reinforcement provided.  [name]  LCSW provided reflective listening and implemented appropriate interventions to help suppport patient and her emotional needs  . Patient was receptive to emotional support provided during session . Discussed plans with patient for ongoing care management follow up and provided patient with direct contact information for care management team . Advised patient to contact CCM program for any urgent needs. . Assisted patient/caregiver with obtaining information about health plan benefits . In the past, patient reported that she sometimes feels "jittery" which may be a result of her medications but reports that she is able to effectively work through this when it arises. Patient reports "it doesn't last long."  . Provided education to patient/caregiver regarding level of care options. . Patient reports that she visited her surgeon today and no longer has to wear her boot. Patient reports her her foot is healing well.  . Patient reports that overall she is doing better and is gaining much needed socialization to combat her anxiety. Patient reports that she is currently out with her aunt and that they plan to go out to eat and get their hair done together. Positive reinforcement provided for self-care and socialization.  . Patient continues  to work part time at [employer] where she receives active socialization.   Patient Self Care Activities:  . Attends all scheduled provider appointments . Calls provider office for new concerns or questions  Please see past updates related to this goal by clicking on the "Past Updates" button in the selected goal      Task: Alleviate Barriers to Anxiety Treatment Success   Note:   Care Management Activities:    - activity or exercise based on tolerance encouraged - awareness of emotional and physical triggers encouraged - barriers to care or meeting goals identified - cognitive behavior therapy provided - complementary therapy use encouraged - coping strategies encouraged - gradual re-participation in activities encouraged - sleep quality monitored    Notes:    Patient Care Plan: RNCM: Coronary Artery Disease (Adult) and HLD    Problem Identified: RNCM: Disease Progression (Coronary Artery Disease) and HLD   Priority: Medium    Long-Range Goal: RNCM: Management of CAD and HLD   Priority: Medium  Note:   Current Barriers:  . Poorly controlled hyperlipidemia, complicated by HTN, anxiety . Current antihyperlipidemic regimen: Lipitor 40 mg QD . Most recent lipid panel:     Component Value Date/Time   CHOL 140 09/29/2020 0852   CHOL 129 06/21/2016 0839   TRIG 81 09/29/2020 0852   TRIG 136 06/21/2016 0839   HDL 59 09/29/2020 0852   VLDL 27 06/21/2016 0839   LDLCALC 65 09/29/2020 0852 .   [name] ASCVD risk enhancing conditions: age >86,  HTN,  former smoker . Lacks social connections . Does not contact provider office for questions/concerns RN Care Manager Clinical Goal(s):  . patient will work with Consulting civil engineer, providers, and care team towards execution of optimized self-health management plan . patient  will verbalize understanding of plan for effective management of HLD and CAD . patient will work with [facility] and pcp to address needs related to effective management of  HLD and CAD . patient will attend all scheduled medical appointments: 04-26-2021 Interventions: . Collaboration with Valerie Roys, DO regarding development and update of comprehensive plan of care as evidenced by provider attestation and co-signature . Inter-disciplinary care team collaboration (see longitudinal plan of care) . Medication review performed; medication list updated in electronic medical record.  Bertram Savin care team collaboration (see longitudinal plan of care) . Referred to pharmacy team for assistance with HLD and CAD medication management . Evaluation of current treatment plan related to HLD and CAD and patient's adherence to plan as established by provider. . Advised patient to call the office for changes in condition or questions . Provided education to patient re: heart healthy diet, taking medications as prescribed and working with the CCM team to optimize health and well being . Reviewed scheduled/upcoming provider appointments including: 04-26-2021 . Discussed plans with patient for ongoing care management follow up and provided patient with direct contact information for care management team Patient Goals/Self-Care Activities: - call for medicine refill 2 or 3 days before it runs out - call if I am sick and can't take my medicine - keep a list of all the medicines I take; vitamins and herbals too - learn to read medicine labels - use a pillbox to sort medicine - use an alarm clock or phone to remind me to take my medicine - drink 6 to 8 glasses of water each day - eat 3 to 5 servings of fruits and vegetables each day - eat 5 or 6 small meals each day - fill half the plate with nonstarchy vegetables - limit fast food meals to no more than 1 per week - manage portion size - prepare main meal at home 3 to 5 days each week - read food labels for fat, fiber, carbohydrates and portion size - be open to making changes - I can manage, know and watch for signs  of a heart attack - if I have chest pain, call for help - learn about small changes that will make a big difference - learn my personal risk factors - barriers to treatment adherence reviewed and addressed - functional limitation screening reviewed - healthy lifestyle promoted - medication-adherence assessment completed - medication side effects managed - rescue (action) plan developed - response to pharmacologic therapy monitored - self-awareness of signs/symptoms of worsening disease encouraged Follow Up Plan: Telephone follow up appointment with care management team member scheduled for: 02-09-2021 at 1030 am     Task: RNCM: Alleviate Barriers to Coronary Artery Disease Therapy and HLD   Note:   Care Management Activities:    - barriers to treatment adherence reviewed and addressed - functional limitation screening reviewed - healthy lifestyle promoted - medication-adherence assessment completed - medication side effects managed - rescue (action) plan developed - response to pharmacologic therapy monitored - self-awareness of signs/symptoms of worsening disease encouraged       Patient Care Plan: RNCM: Hypertension (Adult)    Problem Identified: RNCM: Hypertension (Hypertension)   Priority: Medium    Long-Range Goal: RNCM: Hypertension Monitored   Priority: Medium  Note:   Objective:  . Last practice recorded BP readings:  BP Readings from Last 3 Encounters:  09/29/20 133/80  06/11/20 (!) 122/93  04/23/20 (!) 155/79 .   [name] Most recent eGFR/CrCl: No results found for:  EGFR  No components found for: CRCL Current Barriers:  [name] Knowledge Deficits related to basic understanding of hypertension pathophysiology and self care management . Knowledge Deficits related to understanding of medications prescribed for management of hypertension . Limited Social Support . Lacks social connections . Does not contact provider office for questions/concerns Case Manager Clinical  Goal(s):  . patient will verbalize understanding of plan for hypertension management . patient will attend all scheduled medical appointments: 04-26-2021 . patient will demonstrate improved adherence to prescribed treatment plan for hypertension as evidenced by taking all medications as prescribed, monitoring and recording blood pressure as directed, adhering to low sodium/DASH diet . patient will demonstrate improved health management independence as evidenced by checking blood pressure as directed and notifying PCP if SBP>160 or DBP > 90, taking all medications as prescribe, and adhering to a low sodium diet as discussed. . patient will verbalize basic understanding of hypertension disease process and self health management plan as evidenced by compliance with heart healthy diet, compliance with medications regimen and working with the CCM team to optimize health and well being.  Interventions:  . Collaboration with Valerie Roys, DO regarding development and update of comprehensive plan of care as evidenced by provider attestation and co-signature . Inter-disciplinary care team collaboration (see longitudinal plan of care) . Evaluation of current treatment plan related to hypertension self management and patient's adherence to plan as established by provider. . Provided education to patient re: stroke prevention, s/s of heart attack and stroke, DASH diet, complications of uncontrolled blood pressure . Reviewed medications with patient and discussed importance of compliance . Discussed plans with patient for ongoing care management follow up and provided patient with direct contact information for care management team . Advised patient, providing education and rationale, to monitor blood pressure daily and record, calling PCP for findings outside established parameters.  . Reviewed scheduled/upcoming provider appointments including: 04-26-2021 Self-Care Activities: - Self administers medications  as prescribed Attends all scheduled provider appointments Calls provider office for new concerns, questions, or BP outside discussed parameters Checks BP and records as discussed Follows a low sodium diet/DASH diet Patient Goals: - check blood pressure weekly - choose a place to take my blood pressure (home, clinic or office, retail store) - write blood pressure results in a log or diary - agree on reward when goals are met - agree to work together to make changes - ask questions to understand - have a family meeting to talk about healthy habits - learn about high blood pressure - blood pressure trends reviewed - depression screen reviewed - home or ambulatory blood pressure monitoring encouraged  Follow Up Plan: Telephone follow up appointment with care management team member scheduled for: 02-09-2021 at 1030 am   Task: RNCM: Identify and Monitor Blood Pressure Elevation   Note:   Care Management Activities:    - blood pressure trends reviewed - depression screen reviewed - home or ambulatory blood pressure monitoring encouraged       Patient Care Plan: RNCM: Depression (Adult) and anxiety    Problem Identified: RNCM: Depression Identification (Depression) and anxiety   Priority: Medium    Long-Range Goal: RNCM: Depressive Symptoms Identified and anxiety   Priority: Medium  Note:   Current Barriers:  . Chronic Disease Management support and education needs related to depression and anxiety  . Lacks caregiver support.  Leodis [place] social connections . Does not contact provider office for questions/concerns  Nurse Case Manager Clinical Goal(s):  . patient will verbalize  understanding of plan for effective management of depression and anxeity  . patient will work with [facility] and pcp  to address needs related to effective management of depression and anxiety  . patient will attend all scheduled medical appointments: 04-26-2021 . patient will demonstrate improved adherence to  prescribed treatment plan for depression and anxiety  as evidenced bytaking medications as prescribed, regular MD appointments and working with the CCM team to optimize health and well being.   Interventions:  . 1:1 collaboration with Valerie Roys, DO regarding development and update of comprehensive plan of care as evidenced by provider attestation and co-signature . Inter-disciplinary care team collaboration (see longitudinal plan of care) . Evaluation of current treatment plan related to depression and anxiety  and patient's adherence to plan as established by provider. . Advised patient to call the office for changes in mood/anxiety/ depression . Provided education to patient re: effective management of depression and anxiety including diversion/meditation and faith.  Discussed how the patient has been through a lot in the last couple of years and has overcome obstacles that she thought she would not be able to. Empathetic listening, and support.  . Reviewed medications with patient and discussed compliance. The patient states she has not had to take but a couple of her pills since the last [facility] outreach. She says there was a time she needed them all the time but now she only takes when she absolutely has to have them. She is thankful that her life is more balanced now.  . Discussed plans with patient for ongoing care management follow up and provided patient with direct contact information for care management team  Patient Goals/Self-Care Activities Over the next 120 days, patient will:  - Patient will self administer medications as prescribed Patient will attend all scheduled provider appointments Patient will call pharmacy for medication refills Patient will attend church or other social activities Patient will continue to perform ADL's independently Patient will continue to perform IADL's independently Patient will call provider office for new concerns or questions Patient will work  with BSW to address care coordination needs and will continue to work with the clinical team to address health care and disease management related needs.   - anxiety screen reviewed - depression screen reviewed - medication list reviewed  Follow Up Plan: Telephone follow up appointment with care management team member scheduled for: 02-09-2021 at 1030 am       Task: RNCM: Identify Depressive Symptoms and Facilitate Treatment   Note:   Care Management Activities:    - anxiety screen reviewed - depression screen reviewed - medication list reviewed         Patient verbalizes understanding of instructions provided today and agrees to view in [place].   Telephone follow up appointment with care management team member scheduled for: 02-09-2021 at 71 am  Noreene Larsson RN, MSN, [facility] Mobile: [phone]

## 2020-12-01 NOTE — Chronic Care Management (AMB) (Signed)
Chronic Care Management   CCM RN Visit Note  12/01/2020 Name: Marie Parker MRN: [id number] DOB: 1945/09/22  Subjective: Marie Parker is a 75 y.o. year old female who is a primary care patient of Valerie Roys, DO. The care management team was consulted for assistance with disease management and care coordination needs.    Engaged with patient by telephone for follow up visit in response to provider referral for case management and/or care coordination services.   Consent to Services:  The patient was given information about Chronic Care Management services, agreed to services, and gave verbal consent prior to initiation of services.  Please see initial visit note for detailed documentation.   Patient agreed to services and verbal consent obtained.   Assessment: Review of patient past medical history, allergies, medications, health status, including review of consultants reports, laboratory and other test data, was performed as part of comprehensive evaluation and provision of chronic care management services.   SDOH (Social Determinants of Health) assessments and interventions performed:    CCM Care Plan  Allergies  Allergen Reactions  . Alendronate Nausea Only and Other (See Comments)    Other reaction(s): NAUSEA GI upset  . Amoxicillin-Pot Clavulanate Diarrhea  . Crestor [Rosuvastatin] Nausea Only  . Cyclobenzaprine Other (See Comments)    Vertigo, couldn't focus eyes  . Erythromycin Other (See Comments)    GI upset   . Fosamax [Alendronate Sodium] Nausea And Vomiting    Outpatient Encounter Medications as of 12/01/2020  Medication Sig  . acetaminophen (TYLENOL) 650 MG CR tablet Take 1,300 mg by mouth daily as needed for pain.   [name] amitriptyline (ELAVIL) 25 MG tablet Take 1 tablet (25 mg total) by mouth at bedtime.  [name] amLODipine (NORVASC) 10 MG tablet Take 1 tablet (10 mg total) by mouth daily.  [name] aspirin 81 MG tablet Take 1 tablet (81 mg total) by mouth daily.  (Patient taking differently: Take 81 mg by mouth at bedtime.)  . atenolol (TENORMIN) 50 MG tablet Take 1 tablet (50 mg total) by mouth daily.  [name] atorvastatin (LIPITOR) 40 MG tablet Take 1 tablet (40 mg total) by mouth at bedtime.  . benazepril (LOTENSIN) 40 MG tablet Take 1 tablet (40 mg total) by mouth daily.  [name] CALCIUM-VITAMIN D PO Take 2 tablets by mouth daily.  . clonazePAM (KLONOPIN) 0.5 MG tablet Take 0.5-1 tablets (0.25-0.5 mg total) by mouth 2 (two) times daily as needed for anxiety.  . fluticasone (FLONASE) 50 MCG/ACT nasal spray Place 2 sprays into both nostrils daily.  . hydrALAZINE (APRESOLINE) 25 MG tablet Take 1 tablet (25 mg total) by mouth 2 (two) times daily.  [name] letrozole (FEMARA) 2.5 MG tablet TAKE 1 TABLET(2.5 MG) BY MOUTH DAILY (Patient taking differently: Take 2.5 mg by mouth daily.)  . Multiple Vitamin (MULTIVITAMIN WITH MINERALS) TABS tablet Take 1 tablet by mouth daily.  . niacin 500 MG tablet Take 1,000 mg by mouth at bedtime.  [name] omeprazole (PRILOSEC) 40 MG capsule TAKE 1 CAPSULE BY MOUTH  TWICE DAILY BEFORE A MEAL  . Probiotic Product (PROBIOTIC-10 PO) Take by mouth daily.   . sertraline (ZOLOFT) 100 MG tablet Take 1.5 tablets (150 mg total) by mouth daily.  . traMADol (ULTRAM) 50 MG tablet Take 50 mg by mouth every 6 (six) hours as needed for moderate pain.   . Turmeric 500 MG TABS Take 500 mg by mouth daily.    No facility-administered encounter medications on file as of 12/01/2020.    Patient Active  Problem List   Diagnosis Date Noted  . Aortic atherosclerosis ([place]) 11/23/2020  . History of colonic polyps   . Morton's neuroma of right foot 11/28/2019  . Osteoarthritis of elbow 11/28/2019  . Bunion 09/09/2019  . Right elbow pain 09/05/2019  . Osteopenia 06/17/2018  . Malignant neoplasm of overlapping sites of left female breast ([place]) 04/10/2018  . Liver cyst 02/22/2018  . Senile purpura ([place]) 12/27/2017  . GAD (generalized anxiety disorder) 06/21/2016  .  Personal history of tobacco use, presenting hazards to health 02/02/2016  . Multiple pulmonary nodules determined by computed tomography of lung 12/21/2015  . Benign hypertensive renal disease 06/22/2015  . Hyperlipidemia 06/22/2015  . Gout 06/22/2015  . Lichen sclerosus 46/50/3546  . Lower abdominal pain 05/31/2013  . GERD (gastroesophageal reflux disease) 01/22/2013  . Peripheral vascular disease ([place]) 01/22/2013  . CAD (coronary artery disease) 01/22/2013    Conditions to be addressed/monitored:CAD, HTN, HLD, Anxiety and Depression  Care Plan : RNCM: Coronary Artery Disease (Adult) and HLD  Updates made by Vanita Ingles since 12/01/2020 12:00 AM    Problem: RNCM: Disease Progression (Coronary Artery Disease) and HLD   Priority: Medium    Long-Range Goal: RNCM: Management of CAD and HLD   Priority: Medium  Note:   Current Barriers:  . Poorly controlled hyperlipidemia, complicated by HTN, anxiety . Current antihyperlipidemic regimen: Lipitor 40 mg QD . Most recent lipid panel:     Component Value Date/Time   CHOL 140 09/29/2020 0852   CHOL 129 06/21/2016 0839   TRIG 81 09/29/2020 0852   TRIG 136 06/21/2016 0839   HDL 59 09/29/2020 0852   VLDL 27 06/21/2016 0839   LDLCALC 65 09/29/2020 0852 .   [name] ASCVD risk enhancing conditions: age >31,  HTN,  former smoker . Lacks social connections . Does not contact provider office for questions/concerns RN Care Manager Clinical Goal(s):  . patient will work with Consulting civil engineer, providers, and care team towards execution of optimized self-health management plan . patient will verbalize understanding of plan for effective management of HLD and CAD . patient will work with [facility] and pcp to address needs related to effective management of HLD and CAD . patient will attend all scheduled medical appointments: 04-26-2021 Interventions: . Collaboration with Valerie Roys, DO regarding development and update of comprehensive plan of care as  evidenced by provider attestation and co-signature . Inter-disciplinary care team collaboration (see longitudinal plan of care) . Medication review performed; medication list updated in electronic medical record.  Bertram Savin care team collaboration (see longitudinal plan of care) . Referred to pharmacy team for assistance with HLD and CAD medication management . Evaluation of current treatment plan related to HLD and CAD and patient's adherence to plan as established by provider. . Advised patient to call the office for changes in condition or questions . Provided education to patient re: heart healthy diet, taking medications as prescribed and working with the CCM team to optimize health and well being . Reviewed scheduled/upcoming provider appointments including: 04-26-2021 . Discussed plans with patient for ongoing care management follow up and provided patient with direct contact information for care management team Patient Goals/Self-Care Activities: - call for medicine refill 2 or 3 days before it runs out - call if I am sick and can't take my medicine - keep a list of all the medicines I take; vitamins and herbals too - learn to read medicine labels - use a pillbox to sort medicine - use  an alarm clock or phone to remind me to take my medicine - drink 6 to 8 glasses of water each day - eat 3 to 5 servings of fruits and vegetables each day - eat 5 or 6 small meals each day - fill half the plate with nonstarchy vegetables - limit fast food meals to no more than 1 per week - manage portion size - prepare main meal at home 3 to 5 days each week - read food labels for fat, fiber, carbohydrates and portion size - be open to making changes - I can manage, know and watch for signs of a heart attack - if I have chest pain, call for help - learn about small changes that will make a big difference - learn my personal risk factors - barriers to treatment adherence reviewed and  addressed - functional limitation screening reviewed - healthy lifestyle promoted - medication-adherence assessment completed - medication side effects managed - rescue (action) plan developed - response to pharmacologic therapy monitored - self-awareness of signs/symptoms of worsening disease encouraged Follow Up Plan: Telephone follow up appointment with care management team member scheduled for: 02-09-2021 at 1030 am     Task: RNCM: Alleviate Barriers to Coronary Artery Disease Therapy and HLD   Note:   Care Management Activities:    - barriers to treatment adherence reviewed and addressed - functional limitation screening reviewed - healthy lifestyle promoted - medication-adherence assessment completed - medication side effects managed - rescue (action) plan developed - response to pharmacologic therapy monitored - self-awareness of signs/symptoms of worsening disease encouraged       Care Plan : RNCM: Hypertension (Adult)  Updates made by Vanita Ingles since 12/01/2020 12:00 AM    Problem: RNCM: Hypertension (Hypertension)   Priority: Medium    Long-Range Goal: RNCM: Hypertension Monitored   Priority: Medium  Note:   Objective:  . Last practice recorded BP readings:  BP Readings from Last 3 Encounters:  09/29/20 133/80  06/11/20 (!) 122/93  04/23/20 (!) 155/79 .   [name] Most recent eGFR/CrCl: No results found for: EGFR  No components found for: CRCL Current Barriers:  [name] Knowledge Deficits related to basic understanding of hypertension pathophysiology and self care management . Knowledge Deficits related to understanding of medications prescribed for management of hypertension . Limited Social Support . Lacks social connections . Does not contact provider office for questions/concerns Case Manager Clinical Goal(s):  . patient will verbalize understanding of plan for hypertension management . patient will attend all scheduled medical appointments: 04-26-2021 . patient  will demonstrate improved adherence to prescribed treatment plan for hypertension as evidenced by taking all medications as prescribed, monitoring and recording blood pressure as directed, adhering to low sodium/DASH diet . patient will demonstrate improved health management independence as evidenced by checking blood pressure as directed and notifying PCP if SBP>160 or DBP > 90, taking all medications as prescribe, and adhering to a low sodium diet as discussed. . patient will verbalize basic understanding of hypertension disease process and self health management plan as evidenced by compliance with heart healthy diet, compliance with medications regimen and working with the CCM team to optimize health and well being.  Interventions:  . Collaboration with Valerie Roys, DO regarding development and update of comprehensive plan of care as evidenced by provider attestation and co-signature . Inter-disciplinary care team collaboration (see longitudinal plan of care) . Evaluation of current treatment plan related to hypertension self management and patient's adherence to plan as established by  provider. . Provided education to patient re: stroke prevention, s/s of heart attack and stroke, DASH diet, complications of uncontrolled blood pressure . Reviewed medications with patient and discussed importance of compliance . Discussed plans with patient for ongoing care management follow up and provided patient with direct contact information for care management team . Advised patient, providing education and rationale, to monitor blood pressure daily and record, calling PCP for findings outside established parameters.  . Reviewed scheduled/upcoming provider appointments including: 04-26-2021 Self-Care Activities: - Self administers medications as prescribed Attends all scheduled provider appointments Calls provider office for new concerns, questions, or BP outside discussed parameters Checks BP and  records as discussed Follows a low sodium diet/DASH diet Patient Goals: - check blood pressure weekly - choose a place to take my blood pressure (home, clinic or office, retail store) - write blood pressure results in a log or diary - agree on reward when goals are met - agree to work together to make changes - ask questions to understand - have a family meeting to talk about healthy habits - learn about high blood pressure - blood pressure trends reviewed - depression screen reviewed - home or ambulatory blood pressure monitoring encouraged  Follow Up Plan: Telephone follow up appointment with care management team member scheduled for: 02-09-2021 at 1030 am   Task: RNCM: Identify and Monitor Blood Pressure Elevation   Note:   Care Management Activities:    - blood pressure trends reviewed - depression screen reviewed - home or ambulatory blood pressure monitoring encouraged       Care Plan : RNCM: Depression (Adult) and anxiety  Updates made by Vanita Ingles since 12/01/2020 12:00 AM    Problem: RNCM: Depression Identification (Depression) and anxiety   Priority: Medium    Long-Range Goal: RNCM: Depressive Symptoms Identified and anxiety   Priority: Medium  Note:   Current Barriers:  . Chronic Disease Management support and education needs related to depression and anxiety  . Lacks caregiver support.  Leodis [place] social connections . Does not contact provider office for questions/concerns  Nurse Case Manager Clinical Goal(s):  . patient will verbalize understanding of plan for effective management of depression and anxeity  . patient will work with [facility] and pcp  to address needs related to effective management of depression and anxiety  . patient will attend all scheduled medical appointments: 04-26-2021 . patient will demonstrate improved adherence to prescribed treatment plan for depression and anxiety  as evidenced bytaking medications as prescribed, regular MD appointments  and working with the CCM team to optimize health and well being.   Interventions:  . 1:1 collaboration with Valerie Roys, DO regarding development and update of comprehensive plan of care as evidenced by provider attestation and co-signature . Inter-disciplinary care team collaboration (see longitudinal plan of care) . Evaluation of current treatment plan related to depression and anxiety  and patient's adherence to plan as established by provider. . Advised patient to call the office for changes in mood/anxiety/ depression . Provided education to patient re: effective management of depression and anxiety including diversion/meditation and faith.  Discussed how the patient has been through a lot in the last couple of years and has overcome obstacles that she thought she would not be able to. Empathetic listening, and support.  . Reviewed medications with patient and discussed compliance. The patient states she has not had to take but a couple of her pills since the last [facility] outreach. She says there was a time  she needed them all the time but now she only takes when she absolutely has to have them. She is thankful that her life is more balanced now.  . Discussed plans with patient for ongoing care management follow up and provided patient with direct contact information for care management team  Patient Goals/Self-Care Activities Over the next 120 days, patient will:  - Patient will self administer medications as prescribed Patient will attend all scheduled provider appointments Patient will call pharmacy for medication refills Patient will attend church or other social activities Patient will continue to perform ADL's independently Patient will continue to perform IADL's independently Patient will call provider office for new concerns or questions Patient will work with BSW to address care coordination needs and will continue to work with the clinical team to address health care and disease  management related needs.   - anxiety screen reviewed - depression screen reviewed - medication list reviewed  Follow Up Plan: Telephone follow up appointment with care management team member scheduled for: 02-09-2021 at 11 am       Task: RNCM: Identify Depressive Symptoms and Facilitate Treatment   Note:   Care Management Activities:    - anxiety screen reviewed - depression screen reviewed - medication list reviewed         Plan:Telephone follow up appointment with care management team member scheduled for:  02-09-2021 at 104 am  [place], MSN, [facility] Mobile: [phone]

## 2020-12-09 ENCOUNTER — Telehealth: Payer: Self-pay

## 2020-12-26 HISTORY — PX: CATARACT EXTRACTION, BILATERAL: SHX1313

## 2020-12-29 DIAGNOSIS — I129 Hypertensive chronic kidney disease with stage 1 through stage 4 chronic kidney disease, or unspecified chronic kidney disease: Secondary | ICD-10-CM | POA: Diagnosis not present

## 2020-12-29 DIAGNOSIS — H52212 Irregular astigmatism, left eye: Secondary | ICD-10-CM | POA: Diagnosis not present

## 2020-12-29 DIAGNOSIS — H25812 Combined forms of age-related cataract, left eye: Secondary | ICD-10-CM | POA: Diagnosis not present

## 2020-12-29 DIAGNOSIS — N189 Chronic kidney disease, unspecified: Secondary | ICD-10-CM | POA: Diagnosis not present

## 2021-01-10 ENCOUNTER — Ambulatory Visit (INDEPENDENT_AMBULATORY_CARE_PROVIDER_SITE_OTHER): Payer: Medicare Other | Admitting: Licensed Clinical Social Worker

## 2021-01-10 DIAGNOSIS — I2510 Atherosclerotic heart disease of native coronary artery without angina pectoris: Secondary | ICD-10-CM | POA: Diagnosis not present

## 2021-01-10 DIAGNOSIS — E782 Mixed hyperlipidemia: Secondary | ICD-10-CM | POA: Diagnosis not present

## 2021-01-10 DIAGNOSIS — I129 Hypertensive chronic kidney disease with stage 1 through stage 4 chronic kidney disease, or unspecified chronic kidney disease: Secondary | ICD-10-CM

## 2021-01-10 DIAGNOSIS — F411 Generalized anxiety disorder: Secondary | ICD-10-CM

## 2021-01-10 DIAGNOSIS — K219 Gastro-esophageal reflux disease without esophagitis: Secondary | ICD-10-CM

## 2021-01-10 NOTE — Patient Instructions (Signed)
Visit Information  Goals Addressed              This Visit's Progress     Patient Stated   .  SW-I want to improve my self-care and decrease my anxiety (pt-stated)   On track     Patient Self Care Activities/Goals:  . Attend all scheduled provider appointments . Call provider office for new concerns or questions . Continue compliance with medication management . Continue utilizing healthy coping skills       Patient verbalizes understanding of instructions provided today.   Telephone follow up appointment with care management team member scheduled for: 04/04/21 Christa See, MSW, [email or web address] Phone [phone] 9:12 AM

## 2021-01-10 NOTE — Chronic Care Management (AMB) (Signed)
Chronic Care Management    Clinical Social Work Note  01/10/2021 Name: Marie Parker MRN: [id number] DOB: Jan 29, 1946  Marie Parker is a 75 y.o. year old female who is a primary care patient of Valerie Roys, DO. The CCM team was consulted to assist the patient with chronic disease management and/or care coordination needs related to: Mental Health Counseling and Resources.   Engaged with patient by telephone for follow up visit in response to provider referral for social work chronic care management and care coordination services.   Consent to Services:  The patient was given information about Chronic Care Management services, agreed to services, and gave verbal consent prior to initiation of services.  Please see initial visit note for detailed documentation.   Patient agreed to services and consent obtained.   Assessment: Patient is engaged in conversation, continues to maintain positive progress with care plan goals. Reports decrease in anxiety symptoms with continued compliance with medications. See Care Plan below for interventions and patient self-care actives. Recent life changes /stressors: Management of health conditions Recommendation: Patient may benefit from, and is in agreement to continued utilization of healthy coping skills.  Follow up Plan: Patient would like continued follow-up.  CCM LCSW will follow up with patient on 04/04/21. Patient will call office if needed prior to next encounter.  SDOH (Social Determinants of Health) assessments and interventions performed:    Advanced Directives Status: Not addressed in this encounter.  CCM Care Plan  Allergies  Allergen Reactions  . Alendronate Nausea Only and Other (See Comments)    Other reaction(s): NAUSEA GI upset  . Amoxicillin-Pot Clavulanate Diarrhea  . Crestor [Rosuvastatin] Nausea Only  . Cyclobenzaprine Other (See Comments)    Vertigo, couldn't focus eyes  . Erythromycin Other (See Comments)    GI  upset   . Fosamax [Alendronate Sodium] Nausea And Vomiting    Outpatient Encounter Medications as of 01/10/2021  Medication Sig  . acetaminophen (TYLENOL) 650 MG CR tablet Take 1,300 mg by mouth daily as needed for pain.   [name] amitriptyline (ELAVIL) 25 MG tablet Take 1 tablet (25 mg total) by mouth at bedtime.  [name] amLODipine (NORVASC) 10 MG tablet Take 1 tablet (10 mg total) by mouth daily.  [name] aspirin 81 MG tablet Take 1 tablet (81 mg total) by mouth daily. (Patient taking differently: Take 81 mg by mouth at bedtime.)  . atenolol (TENORMIN) 50 MG tablet Take 1 tablet (50 mg total) by mouth daily.  [name] atorvastatin (LIPITOR) 40 MG tablet Take 1 tablet (40 mg total) by mouth at bedtime.  . benazepril (LOTENSIN) 40 MG tablet Take 1 tablet (40 mg total) by mouth daily.  [name] CALCIUM-VITAMIN D PO Take 2 tablets by mouth daily.  . clonazePAM (KLONOPIN) 0.5 MG tablet Take 0.5-1 tablets (0.25-0.5 mg total) by mouth 2 (two) times daily as needed for anxiety.  . fluticasone (FLONASE) 50 MCG/ACT nasal spray Place 2 sprays into both nostrils daily.  . hydrALAZINE (APRESOLINE) 25 MG tablet Take 1 tablet (25 mg total) by mouth 2 (two) times daily.  [name] letrozole (FEMARA) 2.5 MG tablet TAKE 1 TABLET(2.5 MG) BY MOUTH DAILY (Patient taking differently: Take 2.5 mg by mouth daily.)  . Multiple Vitamin (MULTIVITAMIN WITH MINERALS) TABS tablet Take 1 tablet by mouth daily.  . niacin 500 MG tablet Take 1,000 mg by mouth at bedtime.  [name] omeprazole (PRILOSEC) 40 MG capsule TAKE 1 CAPSULE BY MOUTH  TWICE DAILY BEFORE A MEAL  . Probiotic Product (PROBIOTIC-10 PO) Take  by mouth daily.   . sertraline (ZOLOFT) 100 MG tablet Take 1.5 tablets (150 mg total) by mouth daily.  . traMADol (ULTRAM) 50 MG tablet Take 50 mg by mouth every 6 (six) hours as needed for moderate pain.   . Turmeric 500 MG TABS Take 500 mg by mouth daily.    No facility-administered encounter medications on file as of 01/10/2021.    Patient Active Problem  List   Diagnosis Date Noted  . Aortic atherosclerosis ([place]) 11/23/2020  . History of colonic polyps   . Morton's neuroma of right foot 11/28/2019  . Osteoarthritis of elbow 11/28/2019  . Bunion 09/09/2019  . Right elbow pain 09/05/2019  . Osteopenia 06/17/2018  . Malignant neoplasm of overlapping sites of left female breast ([place]) 04/10/2018  . Liver cyst 02/22/2018  . Senile purpura ([place]) 12/27/2017  . GAD (generalized anxiety disorder) 06/21/2016  . Personal history of tobacco use, presenting hazards to health 02/02/2016  . Multiple pulmonary nodules determined by computed tomography of lung 12/21/2015  . Benign hypertensive renal disease 06/22/2015  . Hyperlipidemia 06/22/2015  . Gout 06/22/2015  . Lichen sclerosus 23/55/7322  . Lower abdominal pain 05/31/2013  . GERD (gastroesophageal reflux disease) 01/22/2013  . Peripheral vascular disease ([place]) 01/22/2013  . CAD (coronary artery disease) 01/22/2013    Conditions to be addressed/monitored: Anxiety; Mental Health Concerns   Care Plan : General Social Work (Adult)  Updates made by Rebekah Chesterfield, LCSW since 01/10/2021 12:00 AM    Problem: SW- I want to continue taking good care of myself   Priority: Medium  Onset Date: 07/26/2020    Long-Range Goal: Anxiety Symptoms Monitored and Managed   Start Date: 10/21/2020  This Visit's Progress: On track  Priority: Medium  Note:   Timeframe:  Long-Range Goal Priority:  Medium  Start Date:  10/21/20                         Expected End Date:  04/27/21                    Follow Up Date- 04/04/21  Current Barriers:  . Financial constraints . Limited social support . Level of care concerns . Mental Health Concerns  . Social Isolation . Limited access to caregiver- in terms of spouse . Lacks knowledge of community resource: available personal care service resources and [facility] resource connection within the area  Clinical Social Work Clinical Goal(s):  [name] Over the next 90 days,  client will work with SW to address concerns related to reducing anxiety and improving self-care  Interventions: . Patient interviewed and appropriate assessments performed . Patient shared that she is doing well. Symptoms of anxiety has decreased significantly . Patient reports compliance with medications prescribed by PCP . Patient denies any current stressors or resource needs . LCSW provided reflective listening and implemented appropriate interventions to help suppport patient and her emotional needs  . Discussed plans with patient for ongoing care management follow up and provided patient with direct contact information for care management team  Patient Self Care Activities/Goals:  . Attend all scheduled provider appointments . Call provider office for new concerns or questions . Continue compliance with medication management . Continue utilizing healthy coping skills        Christa See, MSW, [email or web address] Phone [phone] 9:09 AM

## 2021-01-12 DIAGNOSIS — I129 Hypertensive chronic kidney disease with stage 1 through stage 4 chronic kidney disease, or unspecified chronic kidney disease: Secondary | ICD-10-CM | POA: Diagnosis not present

## 2021-01-12 DIAGNOSIS — N189 Chronic kidney disease, unspecified: Secondary | ICD-10-CM | POA: Diagnosis not present

## 2021-01-12 DIAGNOSIS — H25811 Combined forms of age-related cataract, right eye: Secondary | ICD-10-CM | POA: Diagnosis not present

## 2021-01-14 ENCOUNTER — Encounter: Payer: Self-pay | Admitting: *Deleted

## 2021-02-09 ENCOUNTER — Telehealth: Payer: Self-pay

## 2021-02-09 NOTE — Telephone Encounter (Signed)
  Care Management   Follow Up Note   02/09/2021 Name: Marie Parker MRN: [id number] DOB: 1946-06-25   Referred by: Valerie Roys, DO Reason for referral : Chronic Care Management (RN CM follow up for chronic care management and care coordination needs.)   Successful contact was made with the patient to discuss care management and care coordination services. Patient declines engagement at this time.   Follow Up Plan: The care management team will reach out to the patient again over the next 60 days.   Salvatore Marvel RN, Agricultural consultant Health  [employer] planner Family Practice Mobile: [phone]

## 2021-02-15 DIAGNOSIS — N6325 Unspecified lump in the left breast, overlapping quadrants: Secondary | ICD-10-CM | POA: Diagnosis not present

## 2021-02-21 DIAGNOSIS — M2011 Hallux valgus (acquired), right foot: Secondary | ICD-10-CM | POA: Diagnosis not present

## 2021-02-21 DIAGNOSIS — M19071 Primary osteoarthritis, right ankle and foot: Secondary | ICD-10-CM | POA: Diagnosis not present

## 2021-03-01 ENCOUNTER — Other Ambulatory Visit: Payer: Self-pay | Admitting: Family Medicine

## 2021-03-01 DIAGNOSIS — K219 Gastro-esophageal reflux disease without esophagitis: Secondary | ICD-10-CM

## 2021-03-02 DIAGNOSIS — Z872 Personal history of diseases of the skin and subcutaneous tissue: Secondary | ICD-10-CM | POA: Diagnosis not present

## 2021-03-02 DIAGNOSIS — L821 Other seborrheic keratosis: Secondary | ICD-10-CM | POA: Diagnosis not present

## 2021-03-02 DIAGNOSIS — L578 Other skin changes due to chronic exposure to nonionizing radiation: Secondary | ICD-10-CM | POA: Diagnosis not present

## 2021-03-02 DIAGNOSIS — Z859 Personal history of malignant neoplasm, unspecified: Secondary | ICD-10-CM | POA: Diagnosis not present

## 2021-03-02 DIAGNOSIS — D225 Melanocytic nevi of trunk: Secondary | ICD-10-CM | POA: Diagnosis not present

## 2021-03-03 ENCOUNTER — Other Ambulatory Visit: Payer: Self-pay | Admitting: General Surgery

## 2021-03-08 ENCOUNTER — Other Ambulatory Visit: Payer: Self-pay | Admitting: General Surgery

## 2021-03-08 DIAGNOSIS — Z1231 Encounter for screening mammogram for malignant neoplasm of breast: Secondary | ICD-10-CM

## 2021-03-10 ENCOUNTER — Ambulatory Visit: Payer: Medicare Other | Admitting: Radiation Oncology

## 2021-03-17 ENCOUNTER — Encounter: Payer: Self-pay | Admitting: Radiation Oncology

## 2021-03-17 ENCOUNTER — Ambulatory Visit
Admission: RE | Admit: 2021-03-17 | Discharge: 2021-03-17 | Disposition: A | Discharge status: Home / Self Care | Payer: Medicare Other | Source: Ambulatory Visit | Attending: Radiation Oncology | Admitting: Radiation Oncology

## 2021-03-17 VITALS — BP 143/72 | HR 67 | Temp 97.3°F | Wt 143.0 lb

## 2021-03-17 DIAGNOSIS — N6323 Unspecified lump in the left breast, lower outer quadrant: Secondary | ICD-10-CM | POA: Insufficient documentation

## 2021-03-17 DIAGNOSIS — C50812 Malignant neoplasm of overlapping sites of left female breast: Secondary | ICD-10-CM | POA: Insufficient documentation

## 2021-03-17 DIAGNOSIS — Z170 Estrogen receptor positive status [ER+]: Secondary | ICD-10-CM | POA: Diagnosis not present

## 2021-03-17 DIAGNOSIS — Z923 Personal history of irradiation: Secondary | ICD-10-CM | POA: Insufficient documentation

## 2021-03-17 DIAGNOSIS — Z79811 Long term (current) use of aromatase inhibitors: Secondary | ICD-10-CM | POA: Diagnosis not present

## 2021-03-17 NOTE — Progress Notes (Signed)
Radiation Oncology Follow up Note  Name: Marie Parker   Date:   03/17/2021 MRN:  [id number] DOB: [birth year]    This 75 y.o. female presents to the clinic today for 3-year follow-up status post accelerated partial breast radiation to her left breast for stage I ER/PR positive invasive mammary carcinoma.[name]  REFERRING PROVIDER: Valerie Roys, DO  HPI: Patient is a 75 year old female now at 3 years having completed accelerated partial breast radiation to her left breast for stage I ER/PR positive invasive mammary carcinoma seen today in routine follow-up she is doing well she felt a small little nodule at approximately the 4 o'clock position of the left breast approximately 2 cm from the nipple.  Dr. Tollie Pizza has evaluated that with ultrasound believes it is a fat necrosis.  She is scheduled for follow-up mammograms next month which I will review..  She is currently on Femara tolerant at well without side effect  COMPLICATIONS OF TREATMENT: none  FOLLOW UP COMPLIANCE: keeps appointments   PHYSICAL EXAM:  BP (!) 143/72   Pulse 67   Temp (!) 97.3 F (36.3 C) (Tympanic)   Wt 143 lb (64.9 kg)   BMI 28.88 kg/m she has a small firm little nodule measuring less than half a centimeter at the 4 o'clock position of the left breast about 2 cm from the nipple Lungs are clear to A&P cardiac examination essentially unremarkable with regular rate and rhythm. No dominant mass or nodularity is noted in either breast in 2 positions examined. Incision is well-healed. No axillary or supraclavicular adenopathy is appreciated. Cosmetic result is excellent.  Well-developed well-nourished patient in NAD. HEENT reveals PERLA, EOMI, discs not visualized.  Oral cavity is clear. No oral mucosal lesions are identified. Neck is clear without evidence of cervical or supraclavicular adenopathy. Lungs are clear to A&P. Cardiac examination is essentially unremarkable with regular rate and rhythm without murmur rub or thrill.  Abdomen is benign with no organomegaly or masses noted. Motor sensory and DTR levels are equal and symmetric in the upper and lower extremities. Cranial nerves II through XII are grossly intact. Proprioception is intact. No peripheral adenopathy or edema is identified. No motor or sensory levels are noted. Crude visual fields are within normal range.  RADIOLOGY RESULTS: Mammograms and ultrasound reviewed.  We will review her mammograms from next month when they become available.  PLAN: Present time patient is doing well mammogram should be helpful in deciphering exactly what this lesion is in the left breast.  I believe it is probably some fat necrosis.  She will follow-up after mammograms with Dr. Tollie Pizza.  I otherwise asked to see her back in 1 year for follow-up.  Patient knows to call with any concerns.  I would like to take this opportunity to thank you for allowing me to participate in the care of your patient.Noreene Filbert, MD

## 2021-03-24 ENCOUNTER — Other Ambulatory Visit: Payer: Self-pay | Admitting: Family Medicine

## 2021-03-24 DIAGNOSIS — F411 Generalized anxiety disorder: Secondary | ICD-10-CM

## 2021-04-04 ENCOUNTER — Ambulatory Visit (INDEPENDENT_AMBULATORY_CARE_PROVIDER_SITE_OTHER): Payer: Medicare Other | Admitting: Licensed Clinical Social Worker

## 2021-04-04 DIAGNOSIS — F411 Generalized anxiety disorder: Secondary | ICD-10-CM

## 2021-04-04 DIAGNOSIS — I2510 Atherosclerotic heart disease of native coronary artery without angina pectoris: Secondary | ICD-10-CM

## 2021-04-04 DIAGNOSIS — I10 Essential (primary) hypertension: Secondary | ICD-10-CM

## 2021-04-04 DIAGNOSIS — Z636 Dependent relative needing care at home: Secondary | ICD-10-CM

## 2021-04-05 ENCOUNTER — Ambulatory Visit: Payer: Self-pay | Admitting: General Practice

## 2021-04-05 ENCOUNTER — Telehealth: Payer: Medicare Other | Admitting: General Practice

## 2021-04-05 DIAGNOSIS — I2510 Atherosclerotic heart disease of native coronary artery without angina pectoris: Secondary | ICD-10-CM

## 2021-04-05 DIAGNOSIS — E782 Mixed hyperlipidemia: Secondary | ICD-10-CM | POA: Diagnosis not present

## 2021-04-05 DIAGNOSIS — I10 Essential (primary) hypertension: Secondary | ICD-10-CM

## 2021-04-05 DIAGNOSIS — F419 Anxiety disorder, unspecified: Secondary | ICD-10-CM

## 2021-04-05 DIAGNOSIS — F411 Generalized anxiety disorder: Secondary | ICD-10-CM

## 2021-04-05 DIAGNOSIS — M25521 Pain in right elbow: Secondary | ICD-10-CM

## 2021-04-05 DIAGNOSIS — N644 Mastodynia: Secondary | ICD-10-CM

## 2021-04-05 DIAGNOSIS — M79671 Pain in right foot: Secondary | ICD-10-CM

## 2021-04-05 NOTE — Chronic Care Management (AMB) (Signed)
Chronic Care Management   CCM RN Visit Note  04/05/2021 Name: Marie Parker MRN: [id number] DOB: 1945-10-22  Subjective: Marie Parker is a 75 y.o. year old female who is a primary care patient of Valerie Roys, DO. The care management team was consulted for assistance with disease management and care coordination needs.    Engaged with patient by telephone for follow up visit in response to provider referral for case management and/or care coordination services.   Consent to Services:  The patient was given information about Chronic Care Management services, agreed to services, and gave verbal consent prior to initiation of services.  Please see initial visit note for detailed documentation.   Patient agreed to services and verbal consent obtained.   Assessment: Review of patient past medical history, allergies, medications, health status, including review of consultants reports, laboratory and other test data, was performed as part of comprehensive evaluation and provision of chronic care management services.   SDOH (Social Determinants of Health) assessments and interventions performed:  SDOH Interventions    Flowsheet Row Most Recent Value  SDOH Interventions   Physical Activity Interventions Other (Comments)  [The patient is working 3 days a week- staying active]  Stress Interventions Other (Comment)  [stressors are better, is doing well and feeling much better]        CCM Care Plan  Allergies  Allergen Reactions   Alendronate Nausea Only and Other (See Comments)    Other reaction(s): NAUSEA GI upset   Amoxicillin-Pot Clavulanate Diarrhea   Crestor [Rosuvastatin] Nausea Only   Cyclobenzaprine Other (See Comments)    Vertigo, couldn't focus eyes   Erythromycin Other (See Comments)    GI upset    Fosamax [Alendronate Sodium] Nausea And Vomiting    Outpatient Encounter Medications as of 04/05/2021  Medication Sig   acetaminophen (TYLENOL) 650 MG CR tablet Take  1,300 mg by mouth daily as needed for pain.    amitriptyline (ELAVIL) 25 MG tablet TAKE 1 TABLET BY MOUTH AT  BEDTIME   amLODipine (NORVASC) 10 MG tablet Take 1 tablet (10 mg total) by mouth daily.   aspirin 81 MG tablet Take 1 tablet (81 mg total) by mouth daily. (Patient taking differently: Take 81 mg by mouth at bedtime.)   atenolol (TENORMIN) 50 MG tablet Take 1 tablet (50 mg total) by mouth daily.   atorvastatin (LIPITOR) 40 MG tablet Take 1 tablet (40 mg total) by mouth at bedtime.   benazepril (LOTENSIN) 40 MG tablet Take 1 tablet (40 mg total) by mouth daily.   CALCIUM-VITAMIN D PO Take 2 tablets by mouth daily.   clonazePAM (KLONOPIN) 0.5 MG tablet Take 0.5-1 tablets (0.25-0.5 mg total) by mouth 2 (two) times daily as needed for anxiety.   fluticasone (FLONASE) 50 MCG/ACT nasal spray Place 2 sprays into both nostrils daily.   hydrALAZINE (APRESOLINE) 25 MG tablet Take 1 tablet (25 mg total) by mouth 2 (two) times daily.   letrozole (FEMARA) 2.5 MG tablet TAKE 1 TABLET(2.5 MG) BY MOUTH DAILY (Patient taking differently: Take 2.5 mg by mouth daily.)   Multiple Vitamin (MULTIVITAMIN WITH MINERALS) TABS tablet Take 1 tablet by mouth daily.   niacin 500 MG tablet Take 1,000 mg by mouth at bedtime.   omeprazole (PRILOSEC) 40 MG capsule TAKE 1 CAPSULE BY MOUTH  TWICE DAILY BEFORE A MEAL   Probiotic Product (PROBIOTIC-10 PO) Take by mouth daily.    sertraline (ZOLOFT) 100 MG tablet TAKE 1 AND 1/2 TABLETS BY  MOUTH DAILY  traMADol (ULTRAM) 50 MG tablet Take 50 mg by mouth every 6 (six) hours as needed for moderate pain.    Turmeric 500 MG TABS Take 500 mg by mouth daily.    No facility-administered encounter medications on file as of 04/05/2021.    Patient Active Problem List   Diagnosis Date Noted   Aortic atherosclerosis ([place]) 11/23/2020   History of colonic polyps    Morton's neuroma of right foot 11/28/2019   Osteoarthritis of elbow 11/28/2019   Bunion 09/09/2019   Right elbow pain  09/05/2019   Osteopenia 06/17/2018   Malignant neoplasm of overlapping sites of left female breast ([place]) 04/10/2018   Liver cyst 02/22/2018   Senile purpura ([place]) 12/27/2017   GAD (generalized anxiety disorder) 06/21/2016   Personal history of tobacco use, presenting hazards to health 02/02/2016   Multiple pulmonary nodules determined by computed tomography of lung 12/21/2015   Benign hypertensive renal disease 06/22/2015   Hyperlipidemia 06/22/2015   Gout 25/42/7062   Lichen sclerosus 37/62/8315   Lower abdominal pain 05/31/2013   GERD (gastroesophageal reflux disease) 01/22/2013   Peripheral vascular disease ([place]) 01/22/2013   CAD (coronary artery disease) 01/22/2013    Conditions to be addressed/monitored:CAD, HTN, HLD, Anxiety, Depression, and Chronic pain  Care Plan : RNCM: Coronary Artery Disease (Adult) and HLD  Updates made by Vanita Ingles since 04/05/2021 12:00 AM     Problem: RNCM: Disease Progression (Coronary Artery Disease) and HLD   Priority: Medium     Long-Range Goal: RNCM: Management of CAD and HLD   Start Date: 12/01/2020  Expected End Date: 12/01/2021  This Visit's Progress: On track  Priority: Medium  Note:   Current Barriers:  Poorly controlled hyperlipidemia, complicated by HTN, anxiety Current antihyperlipidemic regimen: Lipitor 40 mg QD Most recent lipid panel:     Component Value Date/Time   CHOL 140 09/29/2020 0852   CHOL 129 06/21/2016 0839   TRIG 81 09/29/2020 0852   TRIG 136 06/21/2016 0839   HDL 59 09/29/2020 0852   VLDL 27 06/21/2016 0839   LDLCALC 65 09/29/2020 0852   ASCVD risk enhancing conditions: age >30,  HTN,  former smoker Lacks Scientist, product/process development Does not contact provider office for Education officer, museum Clinical Goal(s):  patient will work with Consulting civil engineer, providers, and care team towards execution of optimized self-health management plan patient will verbalize understanding of plan for effective management of  HLD and CAD patient will work with [facility] and pcp to address needs related to effective management of HLD and CAD patient will attend all scheduled medical appointments: 04-26-2021 Interventions: Collaboration with Valerie Roys, DO regarding development and update of comprehensive plan of care as evidenced by provider attestation and co-signature Inter-disciplinary care team collaboration (see longitudinal plan of care) Medication review performed; medication list updated in electronic medical record.  Inter-disciplinary care team collaboration (see longitudinal plan of care) Referred to pharmacy team for assistance with HLD and CAD medication management Evaluation of current treatment plan related to HLD and CAD and patient's adherence to plan as established by provider. 04-05-2021: The patient is doing well and denies any issues related to HLD and CAD. Is eating well and has gained weight back. Is compliant with the poc for HLD and CAD. Advised patient to call the office for changes in condition or questions Provided education to patient re: 04-05-2021: Reviewed- heart healthy diet, taking medications as prescribed and working with the CCM team to optimize health and well being Reviewed scheduled/upcoming  provider appointments including: 04-26-2021 at 0800 am, the patient will see pcp 04-06-2021 for a possible right ear infection.  Discussed plans with patient for ongoing care management follow up and provided patient with direct contact information for care management team Patient Goals/Self-Care Activities: - call for medicine refill 2 or 3 days before it runs out - call if I am sick and can't take my medicine - keep a list of all the medicines I take; vitamins and herbals too - learn to read medicine labels - use a pillbox to sort medicine - use an alarm clock or phone to remind me to take my medicine - drink 6 to 8 glasses of water each day - eat 3 to 5 servings of fruits and vegetables each  day - eat 5 or 6 small meals each day - fill half the plate with nonstarchy vegetables - limit fast food meals to no more than 1 per week - manage portion size - prepare main meal at home 3 to 5 days each week - read food labels for fat, fiber, carbohydrates and portion size - be open to making changes - I can manage, know and watch for signs of a heart attack - if I have chest pain, call for help - learn about small changes that will make a big difference - learn my personal risk factors - barriers to treatment adherence reviewed and addressed - functional limitation screening reviewed - healthy lifestyle promoted - medication-adherence assessment completed - medication side effects managed - rescue (action) plan developed - response to pharmacologic therapy monitored - self-awareness of signs/symptoms of worsening disease encouraged Follow Up Plan: Telephone follow up appointment with care management team member scheduled for: 06-07-2021 at 0900 am      Task: RNCM: Alleviate Barriers to Coronary Artery Disease Therapy and HLD Completed 04/05/2021  Outcome: Positive  Note:   Care Management Activities:    - barriers to treatment adherence reviewed and addressed - functional limitation screening reviewed - healthy lifestyle promoted - medication-adherence assessment completed - medication side effects managed - rescue (action) plan developed - response to pharmacologic therapy monitored - self-awareness of signs/symptoms of worsening disease encouraged        Care Plan : RNCM: Hypertension (Adult)  Updates made by Vanita Ingles since 04/05/2021 12:00 AM     Problem: RNCM: Hypertension (Hypertension)   Priority: Medium     Long-Range Goal: RNCM: Hypertension Monitored   Start Date: 12/01/2020  Expected End Date: 12/01/2021  This Visit's Progress: On track  Priority: Medium  Note:   Objective:  Last practice recorded BP readings:  BP Readings from Last 3 Encounters:   03/17/21 (!) 143/72  09/29/20 133/80  06/11/20 (!) 122/93    Most recent eGFR/CrCl: No results found for: EGFR  No components found for: CRCL Current Barriers:  Knowledge Deficits related to basic understanding of hypertension pathophysiology and self care management Knowledge Deficits related to understanding of medications prescribed for management of hypertension Limited Social Support Lacks social connections Does not contact provider office for questions/concerns Case Manager Clinical Goal(s):  patient will verbalize understanding of plan for hypertension management patient will attend all scheduled medical appointments: 04-26-2021, seeing pcp on 04-06-2021 for possible right ear infection. patient will demonstrate improved adherence to prescribed treatment plan for hypertension as evidenced by taking all medications as prescribed, monitoring and recording blood pressure as directed, adhering to low sodium/DASH diet patient will demonstrate improved health management independence as evidenced by checking blood pressure as  directed and notifying PCP if SBP>160 or DBP > 90, taking all medications as prescribe, and adhering to a low sodium diet as discussed. patient will verbalize basic understanding of hypertension disease process and self health management plan as evidenced by compliance with heart healthy diet, compliance with medications regimen and working with the CCM team to optimize health and well being.  Interventions:  Collaboration with Valerie Roys, DO regarding development and update of comprehensive plan of care as evidenced by provider attestation and co-signature Inter-disciplinary care team collaboration (see longitudinal plan of care) Evaluation of current treatment plan related to hypertension self management and patient's adherence to plan as established by provider. 04-05-2021: The patient denies any issues with HTN. States she has been having good Bp readings. States  she has a current issue going on with her right ear and will see the pcp on 04-06-2021 for evaluation.  Provided education to patient re: stroke prevention, s/s of heart attack and stroke, DASH diet, complications of uncontrolled blood pressure Reviewed medications with patient and discussed importance of compliance. 04-05-2021: Is compliant with medications  Discussed plans with patient for ongoing care management follow up and provided patient with direct contact information for care management team Advised patient, providing education and rationale, to monitor blood pressure daily and record, calling PCP for findings outside established parameters.  Reviewed scheduled/upcoming provider appointments including: 04-26-2021 Self-Care Activities: - Self administers medications as prescribed Attends all scheduled provider appointments Calls provider office for new concerns, questions, or BP outside discussed parameters Checks BP and records as discussed Follows a low sodium diet/DASH diet Patient Goals: - check blood pressure weekly - choose a place to take my blood pressure (home, clinic or office, retail store) - write blood pressure results in a log or diary - agree on reward when goals are met - agree to work together to make changes - ask questions to understand - have a family meeting to talk about healthy habits - learn about high blood pressure - blood pressure trends reviewed - depression screen reviewed - home or ambulatory blood pressure monitoring encouraged  Follow Up Plan: Telephone follow up appointment with care management team member scheduled for: 06-07-2021 at  0900 am    Task: RNCM: Identify and Monitor Blood Pressure Elevation Completed 04/05/2021  Outcome: Positive  Note:   Care Management Activities:    - blood pressure trends reviewed - depression screen reviewed - home or ambulatory blood pressure monitoring encouraged        Care Plan : RNCM: Depression  (Adult) and anxiety  Updates made by Vanita Ingles since 04/05/2021 12:00 AM     Problem: RNCM: Depression Identification (Depression) and anxiety   Priority: Medium     Long-Range Goal: RNCM: Depressive Symptoms Identified and anxiety   Start Date: 12/01/2020  Expected End Date: 12/01/2021  This Visit's Progress: On track  Priority: Medium  Note:   Current Barriers:  Chronic Disease Management support and education needs related to depression and anxiety  Lacks caregiver support.  Lacks social connections Does not contact provider office for questions/concerns  Nurse Case Manager Clinical Goal(s):  patient will verbalize understanding of plan for effective management of depression and anxeity  patient will work with [facility] and pcp  to address needs related to effective management of depression and anxiety  patient will attend all scheduled medical appointments: 04-26-2021 patient will demonstrate improved adherence to prescribed treatment plan for depression and anxiety  as evidenced bytaking medications as prescribed, regular  MD appointments and working with the CCM team to optimize health and well being.   Interventions:  1:1 collaboration with Valerie Roys, DO regarding development and update of comprehensive plan of care as evidenced by provider attestation and co-signature Inter-disciplinary care team collaboration (see longitudinal plan of care) Evaluation of current treatment plan related to depression and anxiety  and patient's adherence to plan as established by provider. 04-05-2021: The patient feels she is in a good place right now with her depression and anxiety. The patient denies any issues with exacerbation. She is working 3 days a week and is thankful her husband is doing so much better. The patient denies any new concerns except for some pain in her foot and elbow and possible ear infection she is currently dealing with. Will continue to monitor for changes.  Advised patient  to call the office for changes in mood/anxiety/ depression Provided education to patient re: effective management of depression and anxiety including diversion/meditation and faith.  Discussed how the patient has been through a lot in the last couple of years and has overcome obstacles that she thought she would not be able to. Empathetic listening, and support.  Reviewed medications with patient and discussed compliance. The patient states she has not had to take but a couple of her pills since the last [facility] outreach. She says there was a time she needed them all the time but now she only takes when she absolutely has to have them. She is thankful that her life is more balanced now. 04-05-2021: The patient is compliant with medications.  Discussed plans with patient for ongoing care management follow up and provided patient with direct contact information for care management team  Patient Goals/Self-Care Activities Over the next 120 days, patient will:  - Patient will self administer medications as prescribed Patient will attend all scheduled provider appointments Patient will call pharmacy for medication refills Patient will attend church or other social activities Patient will continue to perform ADL's independently Patient will continue to perform IADL's independently Patient will call provider office for new concerns or questions Patient will work with BSW to address care coordination needs and will continue to work with the clinical team to address health care and disease management related needs.   - anxiety screen reviewed - depression screen reviewed - medication list reviewed  Follow Up Plan: Telephone follow up appointment with care management team member scheduled for: 06-07-2021 at 0900 am        Task: RNCM: Identify Depressive Symptoms and Facilitate Treatment Completed 04/05/2021  Outcome: Positive  Note:   Care Management Activities:    - anxiety screen reviewed -  depression screen reviewed - medication list reviewed        Care Plan : RNCM Chronic Pain (Adult)  Updates made by Vanita Ingles since 04/05/2021 12:00 AM     Problem: RNCM: Chronic Pain Management (Chronic Pain)   Priority: High  Onset Date: 04/05/2021     Long-Range Goal: Chronic Pain Managed   Start Date: 04/05/2021  Expected End Date: 04/05/2022  This Visit's Progress: On track  Priority: High  Note:   Current Barriers:  Knowledge Deficits related to managing acute/chronic pain Non-adherence to scheduled provider appointments Non-adherence to prescribed medication regimen Difficulty obtaining medications Chronic Disease Management support and education needs related to chronic pain Unable to independently manage chronic pain to left breast and right foot, sometime elbow pain Nurse Case Manager Clinical Goal(s):  patient will verbalize understanding of plan  for managing pain patient will work with RN Care Manager to address new concerns related to uncontrolled pain and discomfort  patient will attend all scheduled medical appointments: 04-06-2021 with the pcp for possible ear infection and 04-26-2021 for physical patient will demonstrate use of different relaxation  skills and/or diversional activities to assist with pain reduction (distraction, imagery, relaxation, massage, acupressure, TENS, heat, and cold application patient will report pain at a level less than 3 to 4 on a 10-10 rating scale patient will use pharmacological and nonpharmacological pain relief strategies patient will verbalize acceptable level of pain relief and ability to engage in desired activities patient will engage in desired activities without an increase in pain level Interventions:  Collaboration with Valerie Roys, DO regarding development and update of comprehensive plan of care as evidenced by provider attestation and co-signature Inter-disciplinary care team collaboration (see longitudinal plan of  care) - careful application of heat or ice encouraged - deep breathing, relaxation and mindfulness use promoted - effectiveness of pharmacologic therapy monitored - medication-induced side effects managed - misuse of pain medication assessed - motivation and barriers to change assessed and addressed - mutually acceptable comfort goal set - pain assessed - pain treatment goals reviewed - premedication prior to activity encouraged Evaluation of current treatment plan related to pain in left breast and right foot, sometimes elbow  and patient's adherence to plan as established by provider. 04-05-2021: The patient states that she is having testing on her left breast and they say it is fat necrosis. The patient will have a mammogram on 04-19-2021 and follow up with the provider on 04-28-2021. She has had an ultrasound recently. She saw podiatrist recently and he states that it is muscle in her right foot that is causing the issue. Discussed heat application today to help with pain to elbow and foot.  Advised patient to discuss with the pcp the effectiveness of Tramadol she has had in the past and if it is acceptable to have a refill for Tramadol. The patient does take Tylenol, sometimes it helps sometimes it does not.  Provided education to patient re: alternative methods to pain control. The patient has not had good response from topical applications. She has not tried heat or cold but is considering it. Reviewed medications with patient and discussed compliance and discussing options with the pcp Discussed plans with patient for ongoing care management follow up and provided patient with direct contact information for care management team Allow patient to maintain a diary of pain ratings, timing, precipitating events, medications, treatments, and what works best to relieve pain,  Refer to support groups and self-help groups Educate patient about the use of pharmacological interventions for pain management-  antianxiety, antidepressants, NSAIDS, opioid analgesics,  Explain the importance of lifestyle modifications to effective pain management  Patient Goals/Self Care Activities:  - careful application of heat or ice encouraged - deep breathing, relaxation and mindfulness use promoted - effectiveness of pharmacologic therapy monitored - medication-induced side effects managed - misuse of pain medication assessed - motivation and barriers to change assessed and addressed - mutually acceptable comfort goal set - pain assessed - pain treatment goals reviewed - premedication prior to activity encouraged Self-administers medications as prescribed Attends all scheduled provider appointments Calls pharmacy for medication refills Calls provider office for new concerns or questions Follow Up Plan: Telephone follow up appointment with care management team member scheduled for: 06-07-2021 at 0900 am      Task: Alleviate Barriers to Chronic Pain Management  Completed 04/05/2021  Outcome: Positive  Note:   Care Management Activities:    - careful application of heat or ice encouraged - deep breathing, relaxation and mindfulness use promoted - effectiveness of pharmacologic therapy monitored - medication-induced side effects managed - misuse of pain medication assessed - motivation and barriers to change assessed and addressed - mutually acceptable comfort goal set - pain assessed - pain treatment goals reviewed - premedication prior to activity encouraged    Notes:      Plan:Telephone follow up appointment with care management team member scheduled for:  06-07-2021  Noreene Larsson RN, MSN, [facility] Mobile: [phone]

## 2021-04-05 NOTE — Patient Instructions (Signed)
Visit Information  PATIENT GOALS:  Goals Addressed             This Visit's Progress    RNCM: Cope with Chronic Pain       Timeframe:  Long-Range Goal Priority:  High Start Date:        04-05-2021                     Expected End Date:     04-05-2022                  Follow Up Date 06/07/2021    - join a support group - learn how to meditate - learn relaxation techniques - practice acceptance of chronic pain - practice relaxation or meditation daily - spend time with positive people - tell myself I can (not I can't) - think of new ways to do favorite things - use distraction techniques - use relaxation during pain    Why is this important?   Stress makes chronic pain feel worse.  Feelings like depression, anxiety, stress and anger can make your body more sensitive to pain.  Learning ways to cope with stress or depression may help you find some relief from the pain.     Notes: 04-05-2021: Having pain in her right foot and saw podiatrist recently. States it hurts 'all the time", Tylenol sometimes effective and the patient states that she has taken tramadol that helps a lot. Denies any acute distress. Will discuss with the pcp possible refill for Tramadol. Was having some pain in left breast and was evaluated and found a lump. The MD states it is a fat necrosis. The patient had an [place] and will have a mammogram on the 23rd with follow up with the provider on 04-28-2021. Will continue to monitor.      RNCM: Track and Manage My Symptoms-Depression       Timeframe:  Long-Range Goal Priority:  High Start Date:   12-01-2020                          Expected End Date:   12-01-2021                    Follow Up Date 06/07/2021   - avoid negative self-talk - develop a personal safety plan - develop a plan to deal with triggers like holidays, anniversaries - exercise at least 2 to 3 times per week - have a plan for how to handle bad days - journal feelings and what helps to feel better or  worse - spend time or talk with others at least 2 to 3 times per week - spend time or talk with others every day - watch for early signs of feeling worse    Why is this important?   Keeping track of your progress will help your treatment team find the right mix of medicine and therapy for you.  Write in your journal every day.  Day-to-day changes in depression symptoms are normal. It may be more helpful to check your progress at the end of each week instead of every day.     Notes: The patient states that her depression and anxiety are stable.The patient is doing better since her husband is stable. She states since the last RNCM call she has only had to use 2 of her anxiety pills. 04-05-2021: The patient is stable. She has a current issue going on with her  right ear and will follow up with the pcp on 04-06-2021.  The patient is at a good place.  Her husband is stable. She is working 3 days a week and enjoying this. She is pacing her activity and sleeping well.         Patient verbalizes understanding of instructions provided today and agrees to view in [place].   Telephone follow up appointment with care management team member scheduled for: 06-07-2021 at 0900 am  Noreene Larsson RN, MSN, [facility] Mobile: [phone]

## 2021-04-06 ENCOUNTER — Ambulatory Visit (INDEPENDENT_AMBULATORY_CARE_PROVIDER_SITE_OTHER): Payer: Medicare Other | Admitting: Family Medicine

## 2021-04-06 ENCOUNTER — Encounter: Payer: Self-pay | Admitting: Family Medicine

## 2021-04-06 ENCOUNTER — Other Ambulatory Visit: Payer: Self-pay

## 2021-04-06 VITALS — BP 148/79 | HR 72 | Temp 99.0°F | Wt 141.0 lb

## 2021-04-06 DIAGNOSIS — H6123 Impacted cerumen, bilateral: Secondary | ICD-10-CM | POA: Diagnosis not present

## 2021-04-06 NOTE — Patient Instructions (Signed)
Visit Information   Goals Addressed               This Visit's Progress     Patient Stated     SW-I want to improve my self-care and decrease my anxiety (pt-stated)   On track     Patient Self Care Activities/Goals:  Attend all scheduled provider appointments Call provider office for new concerns or questions Continue compliance with medication management Continue utilizing healthy coping skills        Patient verbalizes understanding of instructions provided today and agrees to view in [place].   Telephone follow up appointment with care management team member scheduled for:06/24/21  Christa See, MSW, [email or web address] Phone [phone] 8:08 AM

## 2021-04-06 NOTE — Chronic Care Management (AMB) (Signed)
Chronic Care Management    Clinical Social Work Note  04/06/2021 Name: Marie Parker MRN: [id number] DOB: [birth year]  Marie Parker is a 75 y.o. year old female who is a primary care patient of Valerie Roys, DO. The CCM team was consulted to assist the patient with chronic disease management and/or care coordination needs related to: Mental Health Counseling and Resources and Caregiver Stress.   Engaged with patient by telephone for follow up visit in response to provider referral for social work chronic care management and care coordination services.   Consent to Services:  The patient was given information about Chronic Care Management services, agreed to services, and gave verbal consent prior to initiation of services.  Please see initial visit note for detailed documentation.   Patient agreed to services and consent obtained.   Assessment: Patient is engaged in conversation, continues to maintain positive progress with care plan goals. Patient continues compliance with medication management and reports decrease in anxiety symptoms. See Care Plan below for interventions and patient self-care actives.  Recommendation: Patient may benefit from, and is in agreement to work with LCSW to address care coordination needs and will continue to work with the clinical team to address health care and disease management related needs.   Follow up Plan: Patient would like continued follow-up from CCM LCSW .  Follow up scheduled in 06/24/21. Patient will call office if needed prior to next encounter.    SDOH (Social Determinants of Health) assessments and interventions performed:    Advanced Directives Status: Not addressed in this encounter.  CCM Care Plan  Allergies  Allergen Reactions   Alendronate Nausea Only and Other (See Comments)    Other reaction(s): NAUSEA GI upset   Amoxicillin-Pot Clavulanate Diarrhea   Crestor [Rosuvastatin] Nausea Only   Cyclobenzaprine Other (See  Comments)    Vertigo, couldn't focus eyes   Erythromycin Other (See Comments)    GI upset    Fosamax [Alendronate Sodium] Nausea And Vomiting    Outpatient Encounter Medications as of 04/04/2021  Medication Sig   acetaminophen (TYLENOL) 650 MG CR tablet Take 1,300 mg by mouth daily as needed for pain.    amitriptyline (ELAVIL) 25 MG tablet TAKE 1 TABLET BY MOUTH AT  BEDTIME   amLODipine (NORVASC) 10 MG tablet Take 1 tablet (10 mg total) by mouth daily.   aspirin 81 MG tablet Take 1 tablet (81 mg total) by mouth daily. (Patient taking differently: Take 81 mg by mouth at bedtime.)   atenolol (TENORMIN) 50 MG tablet Take 1 tablet (50 mg total) by mouth daily.   atorvastatin (LIPITOR) 40 MG tablet Take 1 tablet (40 mg total) by mouth at bedtime.   benazepril (LOTENSIN) 40 MG tablet Take 1 tablet (40 mg total) by mouth daily.   CALCIUM-VITAMIN D PO Take 2 tablets by mouth daily.   clonazePAM (KLONOPIN) 0.5 MG tablet Take 0.5-1 tablets (0.25-0.5 mg total) by mouth 2 (two) times daily as needed for anxiety.   fluticasone (FLONASE) 50 MCG/ACT nasal spray Place 2 sprays into both nostrils daily.   hydrALAZINE (APRESOLINE) 25 MG tablet Take 1 tablet (25 mg total) by mouth 2 (two) times daily.   letrozole (FEMARA) 2.5 MG tablet TAKE 1 TABLET(2.5 MG) BY MOUTH DAILY (Patient taking differently: Take 2.5 mg by mouth daily.)   Multiple Vitamin (MULTIVITAMIN WITH MINERALS) TABS tablet Take 1 tablet by mouth daily.   niacin 500 MG tablet Take 1,000 mg by mouth at bedtime.   omeprazole (PRILOSEC) 40 MG  capsule TAKE 1 CAPSULE BY MOUTH  TWICE DAILY BEFORE A MEAL   Probiotic Product (PROBIOTIC-10 PO) Take by mouth daily.    sertraline (ZOLOFT) 100 MG tablet TAKE 1 AND 1/2 TABLETS BY  MOUTH DAILY   traMADol (ULTRAM) 50 MG tablet Take 50 mg by mouth every 6 (six) hours as needed for moderate pain.    Turmeric 500 MG TABS Take 500 mg by mouth daily.    No facility-administered encounter medications on file as of  04/04/2021.    Patient Active Problem List   Diagnosis Date Noted   Aortic atherosclerosis ([place]) 11/23/2020   History of colonic polyps    Morton's neuroma of right foot 11/28/2019   Osteoarthritis of elbow 11/28/2019   Bunion 09/09/2019   Right elbow pain 09/05/2019   Osteopenia 06/17/2018   Malignant neoplasm of overlapping sites of left female breast ([place]) 04/10/2018   Liver cyst 02/22/2018   Senile purpura ([place]) 12/27/2017   GAD (generalized anxiety disorder) 06/21/2016   Personal history of tobacco use, presenting hazards to health 02/02/2016   Multiple pulmonary nodules determined by computed tomography of lung 12/21/2015   Benign hypertensive renal disease 06/22/2015   Hyperlipidemia 06/22/2015   Gout [id number]   Lichen sclerosus 0000000   Lower abdominal pain 05/31/2013   GERD (gastroesophageal reflux disease) 01/22/2013   Peripheral vascular disease ([place]) 01/22/2013   CAD (coronary artery disease) 01/22/2013    Conditions to be addressed/monitored: Anxiety; Mental Health Concerns   Care Plan : General Social Work (Adult)  Updates made by Rebekah Chesterfield, LCSW since 04/06/2021 12:00 AM     Problem: SW- I want to continue taking good care of myself   Priority: Medium  Onset Date: 07/26/2020     Long-Range Goal: Anxiety Symptoms Monitored and Managed   Start Date: 10/21/2020  This Visit's Progress: On track  Recent Progress: On track  Priority: Medium  Note:   Timeframe:  Long-Range Goal Priority:  Medium  Start Date:  10/21/20                         Expected End Date:  04/27/21                    Follow Up Date- 06/24/21  Current Barriers:  Financial constraints Limited social support Level of care concerns Mental Health Concerns  Social Isolation Limited access to caregiver- in terms of spouse Lacks knowledge of community resource: available personal care service resources and [facility] resource connection within the area  Clinical Social Work Clinical  Goal(s):  Over the next 90 days, client will work with SW to address concerns related to reducing anxiety and improving self-care  Interventions: Patient interviewed and appropriate assessments performed Patient reports she been "very well" She recently had cataracts surgery in both eyes stating, it was "the best thing I ever done. I can see so much better" Patient currently uses reading glasses while at work part-time Patient reported that her anxiety was triggered earlier in the year when spouse was very sick. She often felt uncomfortable preparing his heart medications; however, his medications were adjusted to an inhaler, which has worked effectively for spouse. Patient shared that his health has since improved Caregiver stress was acknowledged. CCM LCSW provided validation and encouragement Patient has an upcoming mammogram this month  Patient continues medication management to assist with management of symptoms Patient denies any current stressors or resource needs LCSW provided reflective listening and implemented  appropriate interventions to help suppport patient and her emotional needs  Discussed plans with patient for ongoing care management follow up and provided patient with direct contact information for care management team  Patient Self Care Activities/Goals:  Attend all scheduled provider appointments Call provider office for new concerns or questions Continue compliance with medication management Continue utilizing healthy coping skills        Christa See, MSW, [email or web address] Phone [phone] 8:06 AM

## 2021-04-06 NOTE — Progress Notes (Signed)
BP (!) 148/79   Pulse 72   Temp 99 F (37.2 C) (Oral)   Wt 141 lb (64 kg)   SpO2 98%   BMI 28.48 kg/m    Subjective:    Patient ID: Marie Parker, female    DOB: [birth year], 75 y.o.   MRN: [id number]  HPI: Marie Parker is a 75 y.o. female  Chief Complaint  Patient presents with   Ear Pain    Pt states her R ear started bothering her around this past Friday. States it feels full at times and feels like there is water in there    EAR PAIN Duration: 4-5 days Involved ear(s): right Severity:  moderate  Quality:  sharp Fever: no Otorrhea: no Upper respiratory infection symptoms: yes Pruritus: no Hearing loss: no Water immersion no Using Q-tips: no Recurrent otitis media: no Status: fluctuating Treatments attempted: none  Relevant past medical, surgical, family and social history reviewed and updated as indicated. Interim medical history since our last visit reviewed. Allergies and medications reviewed and updated.  Review of Systems  Per HPI unless specifically indicated above     Objective:    BP (!) 148/79   Pulse 72   Temp 99 F (37.2 C) (Oral)   Wt 141 lb (64 kg)   SpO2 98%   BMI 28.48 kg/m   Wt Readings from Last 3 Encounters:  04/06/21 141 lb (64 kg)  03/17/21 143 lb (64.9 kg)  09/29/20 139 lb 12.8 oz (63.4 kg)    Physical Exam Vitals and nursing note reviewed.  Constitutional:      General: She is not in acute distress.    Appearance: Normal appearance. She is not ill-appearing, toxic-appearing or diaphoretic.  HENT:     Head: Normocephalic and atraumatic.     Right Ear: External ear normal. There is impacted cerumen.     Left Ear: External ear normal. There is impacted cerumen.     Nose: Nose normal.     Mouth/Throat:     Mouth: Mucous membranes are moist.     Pharynx: Oropharynx is clear.  Eyes:     General: No scleral icterus.       Right eye: No discharge.        Left eye: No discharge.     Extraocular Movements: Extraocular  movements intact.     Conjunctiva/sclera: Conjunctivae normal.     Pupils: Pupils are equal, round, and reactive to light.  Cardiovascular:     Rate and Rhythm: Normal rate and regular rhythm.     Pulses: Normal pulses.     Heart sounds: Normal heart sounds. No murmur heard.   No friction rub. No gallop.  Pulmonary:     Effort: Pulmonary effort is normal. No respiratory distress.     Breath sounds: Normal breath sounds. No stridor. No wheezing, rhonchi or rales.  Chest:     Chest wall: No tenderness.  Musculoskeletal:        General: Normal range of motion.     Cervical back: Normal range of motion and neck supple.  Skin:    General: Skin is warm and dry.     Capillary Refill: Capillary refill takes less than 2 seconds.     Coloration: Skin is not jaundiced or pale.     Findings: No bruising, erythema, lesion or rash.  Neurological:     General: No focal deficit present.     Mental Status: She is alert and oriented to person, place, and time.  Mental status is at baseline.  Psychiatric:        Mood and Affect: Mood normal.        Behavior: Behavior normal.        Thought Content: Thought content normal.        Judgment: Judgment normal.    Results for orders placed or performed in visit on 09/29/20  Comprehensive metabolic panel  Result Value Ref Range   Glucose 96 65 - 99 mg/dL   BUN 10 8 - 27 mg/dL   Creatinine, Ser 0.88 0.57 - 1.00 mg/dL   GFR calc non Af Amer 65 >59 mL/min/1.73   GFR calc Af Amer 75 >59 mL/min/1.73   BUN/Creatinine Ratio 11 (L) 12 - 28   Sodium 144 134 - 144 mmol/L   Potassium 3.8 3.5 - 5.2 mmol/L   Chloride 103 96 - 106 mmol/L   CO2 26 20 - 29 mmol/L   Calcium 8.9 8.7 - 10.3 mg/dL   Total Protein 6.6 6.0 - 8.5 g/dL   Albumin 4.4 3.7 - 4.7 g/dL   Globulin, Total 2.2 1.5 - 4.5 g/dL   Albumin/Globulin Ratio 2.0 1.2 - 2.2   Bilirubin Total 0.3 0.0 - 1.2 mg/dL   Alkaline Phosphatase 107 44 - 121 IU/L   AST 29 0 - 40 IU/L   ALT 19 0 - 32 IU/L  Lipid  Panel w/o Chol/HDL Ratio  Result Value Ref Range   Cholesterol, Total 140 100 - 199 mg/dL   Triglycerides 81 0 - 149 mg/dL   HDL 59 >39 mg/dL   VLDL Cholesterol Cal 16 5 - 40 mg/dL   LDL Chol Calc (NIH) 65 0 - 99 mg/dL  CBC with Differential/Platelet  Result Value Ref Range   WBC 5.5 3.4 - 10.8 x10E3/uL   RBC 4.10 3.77 - 5.28 x10E6/uL   Hemoglobin 11.8 11.1 - 15.9 g/dL   Hematocrit 36.1 34.0 - 46.6 %   MCV 88 79 - 97 fL   MCH 28.8 26.6 - 33.0 pg   MCHC 32.7 31.5 - 35.7 g/dL   RDW 13.4 11.7 - 15.4 %   Platelets 189 150 - 450 x10E3/uL   Neutrophils 64 Not Estab. %   Lymphs 23 Not Estab. %   Monocytes 8 Not Estab. %   Eos 5 Not Estab. %   Basos 0 Not Estab. %   Neutrophils Absolute 3.5 1.4 - 7.0 x10E3/uL   Lymphocytes Absolute 1.3 0.7 - 3.1 x10E3/uL   Monocytes Absolute 0.4 0.1 - 0.9 x10E3/uL   EOS (ABSOLUTE) 0.3 0.0 - 0.4 x10E3/uL   Basophils Absolute 0.0 0.0 - 0.2 x10E3/uL   Immature Granulocytes 0 Not Estab. %   Immature Grans (Abs) 0.0 0.0 - 0.1 x10E3/uL  Uric acid  Result Value Ref Range   Uric Acid 6.4 3.1 - 7.9 mg/dL      Assessment & Plan:   Problem List Items Addressed This Visit   None Visit Diagnoses     Bilateral impacted cerumen    -  Primary   L ear cleared today. R ear still with wax- will start debrox and return in 2 days for flushing.         Follow up plan: Return in about 2 days (around 04/08/2021).

## 2021-04-06 NOTE — Patient Instructions (Signed)
DEBROX

## 2021-04-07 ENCOUNTER — Ambulatory Visit: Payer: Medicare Other

## 2021-04-08 ENCOUNTER — Ambulatory Visit (INDEPENDENT_AMBULATORY_CARE_PROVIDER_SITE_OTHER): Payer: Medicare Other | Admitting: Family Medicine

## 2021-04-08 ENCOUNTER — Ambulatory Visit (INDEPENDENT_AMBULATORY_CARE_PROVIDER_SITE_OTHER): Payer: Medicare Other

## 2021-04-08 ENCOUNTER — Encounter: Payer: Self-pay | Admitting: Family Medicine

## 2021-04-08 ENCOUNTER — Other Ambulatory Visit: Payer: Self-pay

## 2021-04-08 VITALS — BP 147/76 | HR 83 | Temp 98.3°F

## 2021-04-08 VITALS — Ht 59.0 in | Wt 141.0 lb

## 2021-04-08 DIAGNOSIS — Z0000 Encounter for general adult medical examination without abnormal findings: Secondary | ICD-10-CM

## 2021-04-08 DIAGNOSIS — H6121 Impacted cerumen, right ear: Secondary | ICD-10-CM | POA: Diagnosis not present

## 2021-04-08 NOTE — Patient Instructions (Signed)
Marie Parker , Thank you for taking time to come for your Medicare Wellness Visit. I appreciate your ongoing commitment to your health goals. Please review the following plan we discussed and let me know if I can assist you in the future.   Screening recommendations/referrals: Colonoscopy: completed 02/09/2020, due 02/09/2027 Mammogram: completed 04/16/2020 Bone Density: completed 09/07/2020 Recommended yearly ophthalmology/optometry visit for glaucoma screening and checkup Recommended yearly dental visit for hygiene and checkup  Vaccinations: Influenza vaccine: due Pneumococcal vaccine: completed 12/11/2014 Tdap vaccine: completed 04/30/2017, due 05/01/2027 Shingles vaccine: discussed   Covid-19: 05/31/2020, 11/14/2019, 10/24/2019  Advanced directives: Please bring a copy of your POA (Power of Attorney) and/or Living Will to your next appointment.   Conditions/risks identified: none  Next appointment: Follow up in one year for your annual wellness visit    Preventive Care 65 Years and Older, Female Preventive care refers to lifestyle choices and visits with your health care provider that can promote health and wellness. What does preventive care include? A yearly physical exam. This is also called an annual well check. Dental exams once or twice a year. Routine eye exams. Ask your health care provider how often you should have your eyes checked. Personal lifestyle choices, including: Daily care of your teeth and gums. Regular physical activity. Eating a healthy diet. Avoiding tobacco and drug use. Limiting alcohol use. Practicing safe sex. Taking low-dose aspirin every day. Taking vitamin and mineral supplements as recommended by your health care provider. What happens during an annual well check? The services and screenings done by your health care provider during your annual well check will depend on your age, overall health, lifestyle risk factors, and family history of  disease. Counseling  Your health care provider may ask you questions about your: Alcohol use. Tobacco use. Drug use. Emotional well-being. Home and relationship well-being. Sexual activity. Eating habits. History of falls. Memory and ability to understand (cognition). Work and work Statistician. Reproductive health. Screening  You may have the following tests or measurements: Height, weight, and BMI. Blood pressure. Lipid and cholesterol levels. These may be checked every 5 years, or more frequently if you are over 31 years old. Skin check. Lung cancer screening. You may have this screening every year starting at age 79 if you have a 30-pack-year history of smoking and currently smoke or have quit within the past 15 years. Fecal occult blood test (FOBT) of the stool. You may have this test every year starting at age 29. Flexible sigmoidoscopy or colonoscopy. You may have a sigmoidoscopy every 5 years or a colonoscopy every 10 years starting at age 19. Hepatitis C blood test. Hepatitis B blood test. Sexually transmitted disease (STD) testing. Diabetes screening. This is done by checking your blood sugar (glucose) after you have not eaten for a while (fasting). You may have this done every 1-3 years. Bone density scan. This is done to screen for osteoporosis. You may have this done starting at age 56. Mammogram. This may be done every 1-2 years. Talk to your health care provider about how often you should have regular mammograms. Talk with your health care provider about your test results, treatment options, and if necessary, the need for more tests. Vaccines  Your health care provider may recommend certain vaccines, such as: Influenza vaccine. This is recommended every year. Tetanus, diphtheria, and acellular pertussis (Tdap, Td) vaccine. You may need a Td booster every 10 years. Zoster vaccine. You may need this after age 15. Pneumococcal 13-valent conjugate (PCV13) vaccine. One  dose is recommended after age 35. Pneumococcal polysaccharide (PPSV23) vaccine. One dose is recommended after age 4. Talk to your health care provider about which screenings and vaccines you need and how often you need them. This information is not intended to replace advice given to you by your health care provider. Make sure you discuss any questions you have with your health care provider. Document Released: 09/10/2015 Document Revised: 05/03/2016 Document Reviewed: 06/15/2015 Elsevier Interactive Patient Education  2017 [place] Prevention in the Home Falls can cause injuries. They can happen to people of all ages. There are many things you can do to make your home safe and to help prevent falls. What can I do on the outside of my home? Regularly fix the edges of walkways and driveways and fix any cracks. Remove anything that might make you trip as you walk through a door, such as a raised step or threshold. Trim any bushes or trees on the path to your home. Use bright outdoor lighting. Clear any walking paths of anything that might make someone trip, such as rocks or tools. Regularly check to see if handrails are loose or broken. Make sure that both sides of any steps have handrails. Any raised decks and porches should have guardrails on the edges. Have any leaves, snow, or ice cleared regularly. Use sand or salt on walking paths during winter. Clean up any spills in your garage right away. This includes oil or grease spills. What can I do in the bathroom? Use night lights. Install grab bars by the toilet and in the tub and shower. Do not use towel bars as grab bars. Use non-skid mats or decals in the tub or shower. If you need to sit down in the shower, use a plastic, non-slip stool. Keep the floor dry. Clean up any water that spills on the floor as soon as it happens. Remove soap buildup in the tub or shower regularly. Attach bath mats securely with double-sided  non-slip rug tape. Do not have throw rugs and other things on the floor that can make you trip. What can I do in the bedroom? Use night lights. Make sure that you have a light by your bed that is easy to reach. Do not use any sheets or blankets that are too big for your bed. They should not hang down onto the floor. Have a firm chair that has side arms. You can use this for support while you get dressed. Do not have throw rugs and other things on the floor that can make you trip. What can I do in the kitchen? Clean up any spills right away. Avoid walking on wet floors. Keep items that you use a lot in easy-to-reach places. If you need to reach something above you, use a strong step stool that has a grab bar. Keep electrical cords out of the way. Do not use floor polish or wax that makes floors slippery. If you must use wax, use non-skid floor wax. Do not have throw rugs and other things on the floor that can make you trip. What can I do with my stairs? Do not leave any items on the stairs. Make sure that there are handrails on both sides of the stairs and use them. Fix handrails that are broken or loose. Make sure that handrails are as long as the stairways. Check any carpeting to make sure that it is firmly attached to the stairs. Fix any carpet that is loose or worn. Avoid  having throw rugs at the top or bottom of the stairs. If you do have throw rugs, attach them to the floor with carpet tape. Make sure that you have a light switch at the top of the stairs and the bottom of the stairs. If you do not have them, ask someone to add them for you. What else can I do to help prevent falls? Wear shoes that: Do not have high heels. Have rubber bottoms. Are comfortable and fit you well. Are closed at the toe. Do not wear sandals. If you use a stepladder: Make sure that it is fully opened. Do not climb a closed stepladder. Make sure that both sides of the stepladder are locked into place. Ask  someone to hold it for you, if possible. Clearly mark and make sure that you can see: Any grab bars or handrails. First and last steps. Where the edge of each step is. Use tools that help you move around (mobility aids) if they are needed. These include: Canes. Walkers. Scooters. Crutches. Turn on the lights when you go into a dark area. Replace any light bulbs as soon as they burn out. Set up your furniture so you have a clear path. Avoid moving your furniture around. If any of your floors are uneven, fix them. If there are any pets around you, be aware of where they are. Review your medicines with your doctor. Some medicines can make you feel dizzy. This can increase your chance of falling. Ask your doctor what other things that you can do to help prevent falls. This information is not intended to replace advice given to you by your health care provider. Make sure you discuss any questions you have with your health care provider. Document Released: 06/10/2009 Document Revised: 01/20/2016 Document Reviewed: 09/18/2014 Elsevier Interactive Patient Education  2017 [employer].

## 2021-04-08 NOTE — Progress Notes (Signed)
BP (!) 147/76   Pulse 83   Temp 98.3 F (36.8 C) (Oral)   SpO2 99%    Subjective:    Patient ID: Marie Parker, female    DOB: 1946/02/11, 75 y.o.   MRN: [id number]  HPI: Marie Parker is a 75 y.o. female  Chief Complaint  Patient presents with   Cerumen Impaction   EAG CLOGGED Duration: 7-9 days Involved ear(s):  "right Sensation of feeling clogged/plugged: yes Decreased/muffled hearing:yes Ear pain: no Fever: no Otorrhea: no Hearing loss: yes Upper respiratory infection symptoms: no Using Q-Tips: no Status: better History of cerumenosis: yes Treatments attempted:  debrox   Relevant past medical, surgical, family and social history reviewed and updated as indicated. Interim medical history since our last visit reviewed. Allergies and medications reviewed and updated.  Review of Systems  Constitutional: Negative.   HENT:  Positive for hearing loss. Negative for congestion, dental problem, drooling, ear discharge, ear pain, facial swelling, mouth sores, nosebleeds, postnasal drip, rhinorrhea, sinus pressure, sinus pain, sneezing, sore throat, tinnitus, trouble swallowing and voice change.   Respiratory: Negative.    Cardiovascular: Negative.   Gastrointestinal: Negative.   Musculoskeletal: Negative.   Psychiatric/Behavioral: Negative.     Per HPI unless specifically indicated above     Objective:    BP (!) 147/76   Pulse 83   Temp 98.3 F (36.8 C) (Oral)   SpO2 99%   Wt Readings from Last 3 Encounters:  04/08/21 141 lb (64 kg)  04/06/21 141 lb (64 kg)  03/17/21 143 lb (64.9 kg)    Physical Exam Vitals and nursing note reviewed.  Constitutional:      General: She is not in acute distress.    Appearance: Normal appearance. She is not ill-appearing, toxic-appearing or diaphoretic.  HENT:     Head: Normocephalic and atraumatic.     Right Ear: External ear normal.     Left Ear: External ear normal.     Nose: Nose normal.     Mouth/Throat:      Mouth: Mucous membranes are moist.     Pharynx: Oropharynx is clear.  Eyes:     General: No scleral icterus.       Right eye: No discharge.        Left eye: No discharge.     Extraocular Movements: Extraocular movements intact.     Conjunctiva/sclera: Conjunctivae normal.     Pupils: Pupils are equal, round, and reactive to light.  Cardiovascular:     Rate and Rhythm: Normal rate and regular rhythm.     Pulses: Normal pulses.     Heart sounds: Normal heart sounds. No murmur heard.   No friction rub. No gallop.  Pulmonary:     Effort: Pulmonary effort is normal. No respiratory distress.     Breath sounds: Normal breath sounds. No stridor. No wheezing, rhonchi or rales.  Chest:     Chest wall: No tenderness.  Musculoskeletal:        General: Normal range of motion.     Cervical back: Normal range of motion and neck supple.  Skin:    General: Skin is warm and dry.     Capillary Refill: Capillary refill takes less than 2 seconds.     Coloration: Skin is not jaundiced or pale.     Findings: No bruising, erythema, lesion or rash.  Neurological:     General: No focal deficit present.     Mental Status: She is alert and oriented to person,  place, and time. Mental status is at baseline.  Psychiatric:        Mood and Affect: Mood normal.        Behavior: Behavior normal.        Thought Content: Thought content normal.        Judgment: Judgment normal.    Results for orders placed or performed in visit on 09/29/20  Comprehensive metabolic panel  Result Value Ref Range   Glucose 96 65 - 99 mg/dL   BUN 10 8 - 27 mg/dL   Creatinine, Ser 0.88 0.57 - 1.00 mg/dL   GFR calc non Af Amer 65 >59 mL/min/1.73   GFR calc Af Amer 75 >59 mL/min/1.73   BUN/Creatinine Ratio 11 (L) 12 - 28   Sodium 144 134 - 144 mmol/L   Potassium 3.8 3.5 - 5.2 mmol/L   Chloride 103 96 - 106 mmol/L   CO2 26 20 - 29 mmol/L   Calcium 8.9 8.7 - 10.3 mg/dL   Total Protein 6.6 6.0 - 8.5 g/dL   Albumin 4.4 3.7 - 4.7  g/dL   Globulin, Total 2.2 1.5 - 4.5 g/dL   Albumin/Globulin Ratio 2.0 1.2 - 2.2   Bilirubin Total 0.3 0.0 - 1.2 mg/dL   Alkaline Phosphatase 107 44 - 121 IU/L   AST 29 0 - 40 IU/L   ALT 19 0 - 32 IU/L  Lipid Panel w/o Chol/HDL Ratio  Result Value Ref Range   Cholesterol, Total 140 100 - 199 mg/dL   Triglycerides 81 0 - 149 mg/dL   HDL 59 >39 mg/dL   VLDL Cholesterol Cal 16 5 - 40 mg/dL   LDL Chol Calc (NIH) 65 0 - 99 mg/dL  CBC with Differential/Platelet  Result Value Ref Range   WBC 5.5 3.4 - 10.8 x10E3/uL   RBC 4.10 3.77 - 5.28 x10E6/uL   Hemoglobin 11.8 11.1 - 15.9 g/dL   Hematocrit 36.1 34.0 - 46.6 %   MCV 88 79 - 97 fL   MCH 28.8 26.6 - 33.0 pg   MCHC 32.7 31.5 - 35.7 g/dL   RDW 13.4 11.7 - 15.4 %   Platelets 189 150 - 450 x10E3/uL   Neutrophils 64 Not Estab. %   Lymphs 23 Not Estab. %   Monocytes 8 Not Estab. %   Eos 5 Not Estab. %   Basos 0 Not Estab. %   Neutrophils Absolute 3.5 1.4 - 7.0 x10E3/uL   Lymphocytes Absolute 1.3 0.7 - 3.1 x10E3/uL   Monocytes Absolute 0.4 0.1 - 0.9 x10E3/uL   EOS (ABSOLUTE) 0.3 0.0 - 0.4 x10E3/uL   Basophils Absolute 0.0 0.0 - 0.2 x10E3/uL   Immature Granulocytes 0 Not Estab. %   Immature Grans (Abs) 0.0 0.0 - 0.1 x10E3/uL  Uric acid  Result Value Ref Range   Uric Acid 6.4 3.1 - 7.9 mg/dL      Assessment & Plan:   Problem List Items Addressed This Visit   None Visit Diagnoses     Hearing loss due to cerumen impaction, right    -  Primary   Ear flushed today with good results. Continue to monitor. If not better by Monday- will send in prednisone burst.         Follow up plan: Return if symptoms worsen or fail to improve.

## 2021-04-08 NOTE — Progress Notes (Signed)
I connected with Marie Parker today by telephone and verified that I am speaking with the correct person using two identifiers. Location patient: home Location provider: work Persons participating in the virtual visit: Marie Parker, Glenna Durand LPN.   I discussed the limitations, risks, security and privacy concerns of performing an evaluation and management service by telephone and the availability of in person appointments. I also discussed with the patient that there may be a patient responsible charge related to this service. The patient expressed understanding and verbally consented to this telephonic visit.    Interactive audio and video telecommunications were attempted between this provider and patient, however failed, due to patient having technical difficulties OR patient did not have access to video capability.  We continued and completed visit with audio only.     Vital signs may be patient reported or missing.  Subjective:   Marie Parker is a 75 y.o. female who presents for Medicare Annual (Subsequent) preventive examination.  Review of Systems     Cardiac Risk Factors include: advanced age (>34mn, >>65women);dyslipidemia;sedentary lifestyle     Objective:    Today's Vitals   04/08/21 1112  Weight: 141 lb (64 kg)  Height: 4' 11"  (1.499 m)   Body mass index is 28.48 kg/m.  Advanced Directives 04/08/2021 06/11/2020 06/08/2020 04/05/2020 02/09/2020 01/30/2019 01/01/2019  Does Patient Have a Medical Advance Directive? Yes Yes Yes Yes Yes No Yes  Type of Advance Directive Living will Living will Living will Living will Living will - Living will  Does patient want to make changes to medical advance directive? - No - Patient declined No - Patient declined - - - -  [place]? - - - - - - -  Would patient like information on creating a medical advance directive? - - - - - No - Patient declined -    Current Medications  (verified) Outpatient Encounter Medications as of 04/08/2021  Medication Sig   acetaminophen (TYLENOL) 650 MG CR tablet Take 1,300 mg by mouth daily as needed for pain.    amitriptyline (ELAVIL) 25 MG tablet TAKE 1 TABLET BY MOUTH AT  BEDTIME   amLODipine (NORVASC) 10 MG tablet Take 1 tablet (10 mg total) by mouth daily.   aspirin 81 MG tablet Take 1 tablet (81 mg total) by mouth daily. (Patient taking differently: Take 81 mg by mouth at bedtime.)   atenolol (TENORMIN) 50 MG tablet Take 1 tablet (50 mg total) by mouth daily.   atorvastatin (LIPITOR) 40 MG tablet Take 1 tablet (40 mg total) by mouth at bedtime.   benazepril (LOTENSIN) 40 MG tablet Take 1 tablet (40 mg total) by mouth daily.   CALCIUM-VITAMIN D PO Take 2 tablets by mouth daily.   clonazePAM (KLONOPIN) 0.5 MG tablet Take 0.5-1 tablets (0.25-0.5 mg total) by mouth 2 (two) times daily as needed for anxiety.   fluticasone (FLONASE) 50 MCG/ACT nasal spray Place 2 sprays into both nostrils daily.   hydrALAZINE (APRESOLINE) 25 MG tablet Take 1 tablet (25 mg total) by mouth 2 (two) times daily.   letrozole (FEMARA) 2.5 MG tablet TAKE 1 TABLET(2.5 MG) BY MOUTH DAILY (Patient taking differently: Take 2.5 mg by mouth daily.)   Multiple Vitamin (MULTIVITAMIN WITH MINERALS) TABS tablet Take 1 tablet by mouth daily.   niacin 500 MG tablet Take 1,000 mg by mouth at bedtime.   omeprazole (PRILOSEC) 40 MG capsule TAKE 1 CAPSULE BY MOUTH  TWICE DAILY BEFORE A MEAL  Probiotic Product (PROBIOTIC-10 PO) Take by mouth daily.    sertraline (ZOLOFT) 100 MG tablet TAKE 1 AND 1/2 TABLETS BY  MOUTH DAILY   traMADol (ULTRAM) 50 MG tablet Take 50 mg by mouth every 6 (six) hours as needed for moderate pain.    Turmeric 500 MG TABS Take 500 mg by mouth daily.    No facility-administered encounter medications on file as of 04/08/2021.    Allergies (verified) Alendronate, Amoxicillin-pot clavulanate, Crestor [rosuvastatin], Cyclobenzaprine, Erythromycin, and  Fosamax [alendronate sodium]   History: Past Medical History:  Diagnosis Date   Anxiety    Arthritis    Breast cancer ([place])    Breast cancer of upper-outer quadrant of left female breast ([place]) 04/22/2018   16 mm, T1c, N0.  ER/PR positive, HER-2/neu not overexpressed.  Low MammaPrint score.     CAD (coronary artery disease)    Cancer ([facility])    squamous cell   Cataract    Cholecystitis 04/10/2018   Chronic kidney disease    Colon cancer screening 2017?   negative cologuard   Depression    Family history of adverse reaction to anesthesia    mother had N/V   Generalized osteoarthritis    GERD (gastroesophageal reflux disease)    Gout    History of endoscopy 11/2012   Gastritis   Hyperlipidemia    Hypertension    IFG (impaired fasting glucose)    Insomnia    Menopause    Morton's neuroma of right foot 11/28/2019   Osteoporosis    Parathyroid adenoma    Personal history of radiation therapy 2020   Left breast   Personal history of tobacco use, presenting hazards to health 02/02/2016   PVD (peripheral vascular disease) ([place])    PVD (peripheral vascular disease) ([place])    Rotator cuff syndrome of shoulder and allied disorders    Tobacco abuse    Past Surgical History:  Procedure Laterality Date   AIKEN OSTEOTOMY Right 06/11/2020   Procedure: DOUBLE OSTEOTOMY RIGHT;  Surgeon: Samara Deist, DPM;  Location: [facility] ORS;  Service: Podiatry;  Laterality: Right;   BREAST BIOPSY Left 2014   COMPLEX FIBROADENOMA   [facility]   BREAST EXCISIONAL BIOPSY Left 1970's   neg   BREAST FIBROADENOMA SURGERY     BREAST LUMPECTOMY     BREAST MAMMOSITE Left 04/2018   CARDIAC CATHETERIZATION  09/17/2007   Insignificant CAD with 40-50% stenosis of mid LAD.    CATARACT EXTRACTION, BILATERAL  12/2020   COLONOSCOPY WITH PROPOFOL N/A 02/09/2020   Procedure: COLONOSCOPY WITH PROPOFOL;  Surgeon: Lin Landsman, MD;  [facility] END[facility]COPY;  Service: Gastroenterology;   Laterality: N/A;   ESOPHAGOGASTRODUODENOSCOPY (EGD) WITH PROPOFOL N/A 02/09/2020   Procedure: ESOPHAGOGASTRODUODENOSCOPY (EGD) WITH PROPOFOL;  Surgeon: Lin Landsman, MD;  Location: [facility] ENDOSCOPY;  Service: Gastroenterology;  Laterality: N/A;   EXCISION MORTON'S NEUROMA Right 06/11/2020   Procedure: EXCISION MORTON'S NEUROMA;  Surgeon: Samara Deist, DPM;  Location: [facility] ORS;  Service: Podiatry;  Laterality: Right;   HAMMER TOE SURGERY Right 06/11/2020   Procedure: HAMMER TOE CORRECTION T6;  Surgeon: Samara Deist, DPM;  Location: [facility] ORS;  Service: Podiatry;  Laterality: Right;   ILIAC ARTERY STENT     right iliac artery stent    MASTECTOMY, PARTIAL Left 04/22/2018   Procedure: MASTECTOMY PARTIAL;  Surgeon: Robert Bellow, MD;  Location: [facility] ORS;  Service: General;  Laterality: Left;   ORIF PATELLA Left 03/22/2016   Dr. Tristan Schroeder  PARATHYROIDECTOMY  2012   [facility]   SENTINEL NODE BIOPSY Left 04/22/2018   Procedure: SENTINEL NODE BIOPSY;  Surgeon: Robert Bellow, MD;  Location: [facility] ORS;  Service: General;  Laterality: Left;   SKIN BIOPSY     precancerous   Family History  Problem Relation Age of Onset   Heart attack Father 71   Alcohol abuse Father    Heart disease Father    Liver disease Father    Hypertension Father    Heart disease Sister 84       CAD; s/p stent placement   Stroke Sister    AAA (abdominal aortic aneurysm) Sister    Dementia Sister    Stroke Mother    Hypertension Mother    Diabetes Mother    Stroke Maternal Grandmother    Heart attack Maternal Grandfather    Stroke Paternal Grandmother    Breast cancer Maternal Aunt 54   Breast cancer Paternal Aunt    Bone cancer Paternal Uncle    Social History   Socioeconomic History   Marital status: Married    Spouse name: Not on file   Number of children: Not on file   Years of education: Not on file   Highest education level: High school graduate  Occupational History   Occupation: part time  [employer]   Tobacco Use   Smoking status: Former    Packs/day: 1.00    Years: 42.00    Pack years: 42.00    Types: Cigarettes    Quit date: 08/28/2000    Years since quitting: 20.6   Smokeless tobacco: Never  Vaping Use   Vaping Use: Never used  Substance and Sexual Activity   Alcohol use: No   Drug use: No   Sexual activity: Not Currently  Other Topics Concern   Not on file  Social History Narrative   Working part time   Psychologist, occupational at the hospital on Mondays    Social Determinants of Health   Financial Resource Strain: Low Risk    Difficulty of Paying Living Expenses: Not hard at all  Food Insecurity: No Food Insecurity   Worried About Charity fundraiser in the Last Year: Never true   Arboriculturist in the Last Year: Never true  Transportation Needs: No Transportation Needs   Lack of Transportation (Medical): No   Lack of Transportation (Non-Medical): No  Physical Activity: Inactive   Days of Exercise per Week: 0 days   Minutes of Exercise per Session: 0 min  Stress: No Stress Concern Present   Feeling of Stress : Only a little  Social Connections: Engineer, building services of Communication with Friends and Family: More than three times a week   Frequency of Social Gatherings with Friends and Family: More than three times a week   Attends Religious Services: More than 4 times per year   Active Member of [employer] or Organizations: Yes   Attends Music therapist: More than 4 times per year   Marital Status: Married    Tobacco Counseling Counseling given: Not Answered   Clinical Intake:  Pre-visit preparation completed: Yes  Pain : No/denies pain     Nutritional Status: BMI 25 -29 Overweight Nutritional Risks: None Diabetes: No  How often do you need to have someone help you when you read instructions, pamphlets, or other written materials from your doctor or pharmacy?: 1 - Never What is the last grade level you completed in school?:  12th grade  Diabetic? no  Interpreter Needed?: No  Information entered by :: NAllen LPN   Activities of Daily Living In your present state of health, do you have any difficulty performing the following activities: 04/08/2021 06/08/2020  Hearing? Y -  Comment due to wax build up -  Vision? N -  Difficulty concentrating or making decisions? N -  Walking or climbing stairs? Y N  Dressing or bathing? N -  Doing errands, shopping? N N  Preparing Food and eating ? N -  Using the Toilet? N -  In the past six months, have you accidently leaked urine? Y -  Comment with coughing and sneezing -  Do you have problems with loss of bowel control? N -  Managing your Medications? N -  Managing your Finances? N -  Housekeeping or managing your Housekeeping? N -  Some recent data might be hidden    Patient Care Team: Valerie Roys, DO as PCP - General (Family Medicine) Wellington Hampshire, MD as PCP - Cardiology (Cardiology) Jannet Mantis, MD (Dermatology) Dagoberto Ligas, MD as Referring Physician (Internal Medicine) Bary Castilla Forest Gleason, MD (General Surgery) Noreene Filbert, MD as Referring Physician (Radiation Oncology) Vanita Ingles, RN as Case Manager (General Practice) Rebekah Chesterfield, LCSW as Social Worker (Licensed Clinical Social Worker)  Indicate any recent [place] you may have received from other than [facility] providers in the past year (date may be approximate).     Assessment:   This is a routine wellness examination for Marie Parker.  Hearing/Vision screen Vision Screening - Comments:: Regular eye exams, Dr. Atilano Median  Dietary issues and exercise activities discussed: Current Exercise Habits: The patient does not participate in regular exercise at present   Goals Addressed             This Visit's Progress    Patient Stated       04/08/2021, no goals       Depression Screen PHQ 2/9 Scores 04/08/2021 04/05/2021 04/22/2020 04/05/2020 02/20/2020 01/23/2020 01/14/2020   PHQ - 2 Score 0 0 0 0 3 2 2   PHQ- 9 Score - - 0 0 4 9 9   Exception Documentation - - - - - - -    Fall Risk Fall Risk  04/08/2021 04/22/2020 04/22/2020 04/05/2020 02/20/2020  Falls in the past year? 0 0 0 0 0  Number falls in past yr: - - 0 - 0  Injury with Fall? - - 0 - -  Risk for fall due to : Medication side effect - - Medication side effect -  Follow up Falls evaluation completed;Education provided;Falls prevention discussed - - Falls evaluation completed;Education provided;Falls prevention discussed -    FALL RISK PREVENTION PERTAINING TO THE HOME:  Any stairs in or around the home? Yes  If so, are there any without handrails? No  Home free of loose throw rugs in walkways, pet beds, electrical cords, etc? Yes  Adequate lighting in your home to reduce risk of falls? Yes   ASSISTIVE DEVICES UTILIZED TO PREVENT FALLS:  Life alert? No  Use of a cane, walker or w/c? No  Grab bars in the bathroom? No  Shower chair or bench in shower? Yes  Elevated toilet seat or a handicapped toilet? No   TIMED UP AND GO:  Was the test performed? No .      Cognitive Function:     6CIT Screen 04/08/2021 04/05/2020 01/01/2019 12/24/2017 12/21/2016  What Year? 0 points 0 points 0 points 0 points 0  points  What month? 0 points 0 points 0 points 0 points 0 points  What time? 0 points 0 points 0 points 0 points 0 points  Count back from 20 0 points 0 points 0 points 0 points 0 points  Months in reverse 0 points 0 points 0 points 0 points 2 points  Repeat phrase 0 points 4 points 0 points 0 points 4 points  Total Score 0 4 0 0 6    Immunizations Immunization History  Administered Date(s) Administered   Fluad Quad(high Dose 65+) 04/24/2019   Influenza, High Dose Seasonal PF 06/21/2016, 05/24/2018, 06/07/2020   Influenza,inj,Quad PF,6+ Mos 06/22/2015   Influenza-Unspecified 06/02/2014, 05/07/2017   PFIZER(Purple Top)SARS-COV-2 Vaccination 10/24/2019, 11/14/2019, 05/31/2020   Pneumococcal  Conjugate-13 09/01/2014   Pneumococcal Polysaccharide-23 12/11/2014   Td 04/10/2005, 12/21/2015   Tdap 04/30/2017   Zoster, Live 08/11/2008    TDAP status: Up to date  Flu Vaccine status: Due, Education has been provided regarding the importance of this vaccine. Advised may receive this vaccine at local pharmacy or Health Dept. Aware to provide a copy of the vaccination record if obtained from local pharmacy or Health Dept. Verbalized acceptance and understanding.  Pneumococcal vaccine status: Up to date  Covid-19 vaccine status: Completed vaccines  Qualifies for Shingles Vaccine? Yes   Zostavax completed Yes   Shingrix Completed?: No.    Education has been provided regarding the importance of this vaccine. Patient has been advised to call insurance company to determine out of pocket expense if they have not yet received this vaccine. Advised may also receive vaccine at local pharmacy or Health Dept. Verbalized acceptance and understanding.  Screening Tests Health Maintenance  Topic Date Due   INFLUENZA VACCINE  03/28/2021   COVID-19 Vaccine (4 - Booster for [place] series) 04/22/2021 (Originally 08/31/2020)   Zoster Vaccines- Shingrix (1 of 2) 07/09/2021 (Originally 06/27/1965)   MAMMOGRAM  04/16/2022   COLONOSCOPY (Pts 45-22yr Insurance coverage will need to be confirmed)  02/08/2025   TETANUS/TDAP  05/01/2027   DEXA SCAN  Completed   PNA vac Low Risk Adult  Completed   Hepatitis C Screening  Addressed   HPV VACCINES  Aged Out    Health Maintenance  Health Maintenance Due  Topic Date Due   INFLUENZA VACCINE  03/28/2021    Colorectal cancer screening: Type of screening: Colonoscopy. Completed 02/09/2020. Repeat every 7 years  Mammogram status: Completed 04/16/2020. Repeat every year  Bone Density status: Completed 09/07/2020.   Lung Cancer Screening: (Low Dose CT Chest recommended if Age 74-80years, 30 pack-year currently smoking OR have quit w/in 15years.) does not  qualify.   Lung Cancer Screening Referral: no  Additional Screening:  Hepatitis C Screening: does qualify; Completed 12/11/2014  Vision Screening: Recommended annual ophthalmology exams for early detection of glaucoma and other disorders of the eye. Is the patient up to date with their annual eye exam?  Yes  Who is the provider or what is the name of the office in which the patient attends annual eye exams? Dr. CAtilano MedianIf pt is not established with a provider, would they like to be referred to a provider to establish care? No .   Dental Screening: Recommended annual dental exams for proper oral hygiene  Community Resource Referral / Chronic Care Management: CRR required this visit?  No   CCM required this visit?  No      Plan:     I have personally reviewed and noted the following in the patient's chart:  Medical and social history Use of alcohol, tobacco or illicit drugs  Current medications and supplements including opioid prescriptions.  Functional ability and status Nutritional status Physical activity Advanced directives List of other physicians Hospitalizations, surgeries, and ER visits in previous 12 months Vitals Screenings to include cognitive, depression, and falls Referrals and appointments  In addition, I have reviewed and discussed with patient certain preventive protocols, quality metrics, and best practice recommendations. A written personalized care plan for preventive services as well as general preventive health recommendations were provided to patient.     Kellie Simmering, LPN   [id number]   Nurse Notes:

## 2021-04-19 ENCOUNTER — Ambulatory Visit
Admission: RE | Admit: 2021-04-19 | Discharge: 2021-04-19 | Disposition: A | Discharge status: Home / Self Care | Payer: Medicare Other | Source: Ambulatory Visit | Attending: General Surgery | Admitting: General Surgery

## 2021-04-19 ENCOUNTER — Other Ambulatory Visit: Payer: Self-pay

## 2021-04-19 DIAGNOSIS — Z1231 Encounter for screening mammogram for malignant neoplasm of breast: Secondary | ICD-10-CM | POA: Insufficient documentation

## 2021-04-26 ENCOUNTER — Other Ambulatory Visit: Payer: Self-pay

## 2021-04-26 ENCOUNTER — Encounter: Payer: Self-pay | Admitting: Family Medicine

## 2021-04-26 ENCOUNTER — Ambulatory Visit (INDEPENDENT_AMBULATORY_CARE_PROVIDER_SITE_OTHER): Payer: Medicare Other | Admitting: Family Medicine

## 2021-04-26 VITALS — BP 110/77 | HR 67 | Temp 99.3°F | Ht 59.02 in | Wt 141.6 lb

## 2021-04-26 DIAGNOSIS — E782 Mixed hyperlipidemia: Secondary | ICD-10-CM

## 2021-04-26 DIAGNOSIS — Z0000 Encounter for general adult medical examination without abnormal findings: Secondary | ICD-10-CM

## 2021-04-26 DIAGNOSIS — I129 Hypertensive chronic kidney disease with stage 1 through stage 4 chronic kidney disease, or unspecified chronic kidney disease: Secondary | ICD-10-CM | POA: Diagnosis not present

## 2021-04-26 DIAGNOSIS — D692 Other nonthrombocytopenic purpura: Secondary | ICD-10-CM | POA: Diagnosis not present

## 2021-04-26 DIAGNOSIS — M1A9XX0 Chronic gout, unspecified, without tophus (tophi): Secondary | ICD-10-CM | POA: Diagnosis not present

## 2021-04-26 DIAGNOSIS — K219 Gastro-esophageal reflux disease without esophagitis: Secondary | ICD-10-CM | POA: Diagnosis not present

## 2021-04-26 DIAGNOSIS — F411 Generalized anxiety disorder: Secondary | ICD-10-CM

## 2021-04-26 LAB — MICROALBUMIN, URINE WAIVED
Creatinine, Urine Waived: 200 mg/dL (ref 10–300)
Microalb, Ur Waived: 30 mg/L — ABNORMAL HIGH (ref 0–19)
Microalb/Creat Ratio: <30 mg/g (ref <30)

## 2021-04-26 LAB — URINALYSIS, ROUTINE W REFLEX MICROSCOPIC
Bilirubin, UA: NEGATIVE
Glucose, UA: NEGATIVE
Ketones, UA: NEGATIVE
Leukocytes,UA: NEGATIVE
Nitrite, UA: NEGATIVE
Protein,UA: NEGATIVE
RBC, UA: NEGATIVE
Specific Gravity, UA: <1.005 — ABNORMAL LOW (ref 1.005–1.030)
Urobilinogen, Ur: 0.2 mg/dL (ref 0.2–1.0)
pH, UA: 5.5 (ref 5.0–7.5)

## 2021-04-26 MED ORDER — ATORVASTATIN CALCIUM 40 MG PO TABS: 40.0000 mg | ORAL_TABLET | Freq: Every evening | ORAL | 1 refills | Status: DC

## 2021-04-26 MED ORDER — HYDRALAZINE HCL 25 MG PO TABS: 25.0000 mg | ORAL_TABLET | Freq: Two times a day (BID) | ORAL | 1 refills | Status: DC

## 2021-04-26 MED ORDER — BENAZEPRIL HCL 40 MG PO TABS: 40.0000 mg | ORAL_TABLET | Freq: Every day | ORAL | 1 refills | Status: DC

## 2021-04-26 MED ORDER — CLONAZEPAM 0.5 MG PO TABS: 0.2500 mg | ORAL_TABLET | Freq: Two times a day (BID) | ORAL | 0 refills | Status: DC | PRN

## 2021-04-26 MED ORDER — ATENOLOL 50 MG PO TABS: 50.0000 mg | ORAL_TABLET | Freq: Every day | ORAL | 1 refills | Status: DC

## 2021-04-26 MED ORDER — AMLODIPINE BESYLATE 10 MG PO TABS: 10.0000 mg | ORAL_TABLET | Freq: Every day | ORAL | 1 refills | Status: DC

## 2021-04-26 MED ORDER — SERTRALINE HCL 100 MG PO TABS: 150.0000 mg | ORAL_TABLET | Freq: Every day | ORAL | 1 refills | Status: DC

## 2021-04-26 MED ORDER — AMITRIPTYLINE HCL 25 MG PO TABS: 25.0000 mg | ORAL_TABLET | Freq: Every day | ORAL | 1 refills | Status: DC

## 2021-04-26 NOTE — Assessment & Plan Note (Signed)
Under good control on current regimen. Continue current regimen. Continue to monitor. Call with any concerns. Refills given. Labs drawn today.   

## 2021-04-26 NOTE — Progress Notes (Signed)
BP 110/77   Pulse 67   Temp 99.3 F (37.4 C) (Oral)   Ht 4' 11.02" (1.499 m)   Wt 141 lb 9.6 oz (64.2 kg)   SpO2 98%   BMI 28.58 kg/m    Subjective:    Patient ID: Marie Parker, female    DOB: Jul 04, 1946, 75 y.o.   MRN: [id number]  HPI: Marie Parker is a 75 y.o. female presenting on 04/26/2021 for comprehensive medical examination. Current medical complaints include:  HYPERTENSION / HYPERLIPIDEMIA Satisfied with current treatment? yes Duration of hypertension: chronic BP monitoring frequency: not checking BP medication side effects: no Past BP meds: amlodipine, atenolol, hydralazine, benazepril Duration of hyperlipidemia: chronic Cholesterol medication side effects: no Cholesterol supplements: niacin Past cholesterol medications: atorvastatin Medication compliance: excellent compliance Aspirin: yes Recent stressors: no Recurrent headaches: no Visual changes: no Palpitations: no Dyspnea: no Chest pain: no Lower extremity edema: no Dizzy/lightheaded: no  No gout flares. Feeling OK  ANXIETY/STRESS Duration: chronic Status:controlled Anxious mood: yes  Excessive worrying: yes Irritability: no  Sweating: yes Nausea: no Palpitations:no Hyperventilation: no Panic attacks: no Agoraphobia: no  Obscessions/compulsions: no Depressed mood: no Depression screen [facility] 2/9 04/26/2021 04/08/2021 04/05/2021 04/22/2020 04/05/2020  Decreased Interest 0 0 0 0 0  Down, Depressed, Hopeless 0 0 0 0 0  PHQ - 2 Score 0 0 0 0 0  Altered sleeping 3 - - 0 0  Tired, decreased energy 1 - - 0 0  Change in appetite 0 - - 0 0  Feeling bad or failure about yourself  0 - - 0 0  Trouble concentrating 0 - - 0 0  Moving slowly or fidgety/restless 0 - - 0 0  Suicidal thoughts 0 - - 0 0  PHQ-9 Score 4 - - 0 0  Difficult doing work/chores Not difficult at all - - Not difficult at all Not difficult at all  Some recent data might be hidden   Anhedonia: no Weight changes: no Insomnia: yes    Hypersomnia: no Fatigue/loss of energy: yes Feelings of worthlessness: no Feelings of guilt: no Impaired concentration/indecisiveness: no Suicidal ideations: no  Crying spells: no Recent Stressors/Life Changes: yes   Relationship problems: yes   Family stress: yes     Financial stress: no    Job stress: no    Recent death/loss: no  She currently lives with: husband Menopausal Symptoms: no  Depression Screen done today and results listed below:  Depression screen [facility] 2/9 04/26/2021 04/08/2021 04/05/2021 04/22/2020 04/05/2020  Decreased Interest 0 0 0 0 0  Down, Depressed, Hopeless 0 0 0 0 0  PHQ - 2 Score 0 0 0 0 0  Altered sleeping 3 - - 0 0  Tired, decreased energy 1 - - 0 0  Change in appetite 0 - - 0 0  Feeling bad or failure about yourself  0 - - 0 0  Trouble concentrating 0 - - 0 0  Moving slowly or fidgety/restless 0 - - 0 0  Suicidal thoughts 0 - - 0 0  PHQ-9 Score 4 - - 0 0  Difficult doing work/chores Not difficult at all - - Not difficult at all Not difficult at all  Some recent data might be hidden    Past Medical History:  Past Medical History:  Diagnosis Date   Anxiety    Arthritis    Breast cancer (HCC)    Breast cancer of upper-outer quadrant of left female breast (HCC) 04/22/2018   16 mm, T1c, N0.  ER/PR  positive, HER-2/neu not overexpressed.  Low MammaPrint score.     CAD (coronary artery disease)    Cancer (HCC)    squamous cell   Cataract    Cholecystitis 04/10/2018   Chronic kidney disease    Colon cancer screening 2017?   negative cologuard   Depression    Family history of adverse reaction to anesthesia    mother had N/V   Generalized osteoarthritis    GERD (gastroesophageal reflux disease)    Gout    History of endoscopy 11/2012   Gastritis   Hyperlipidemia    Hypertension    IFG (impaired fasting glucose)    Insomnia    Menopause    Morton's neuroma of right foot 11/28/2019   Osteoporosis    Parathyroid adenoma    Personal history of  radiation therapy 2020   Left breast   Personal history of tobacco use, presenting hazards to health 02/02/2016   PVD (peripheral vascular disease) ([place])    PVD (peripheral vascular disease) ([place])    Rotator cuff syndrome of shoulder and allied disorders    Tobacco abuse     Surgical History:  Past Surgical History:  Procedure Laterality Date   AIKEN OSTEOTOMY Right 06/11/2020   Procedure: DOUBLE OSTEOTOMY RIGHT;  Surgeon: Samara Deist, DPM;  Location: [facility] ORS;  Service: Podiatry;  Laterality: Right;   BREAST BIOPSY Left 2014   COMPLEX FIBROADENOMA   BREAST BIOPSY Left 04/05/2018   [facility]   BREAST EXCISIONAL BIOPSY Left 1970's   neg   BREAST FIBROADENOMA SURGERY     BREAST LUMPECTOMY     BREAST MAMMOSITE Left 04/2018   CARDIAC CATHETERIZATION  09/17/2007   Insignificant CAD with 40-50% stenosis of mid LAD.    CATARACT EXTRACTION, BILATERAL  12/2020   COLONOSCOPY WITH PROPOFOL N/A 02/09/2020   Procedure: COLONOSCOPY WITH PROPOFOL;  Surgeon: Lin Landsman, MD;  Location: [facility] ENDOSCOPY;  Service: Gastroenterology;  Laterality: N/A;   ESOPHAGOGASTRODUODENOSCOPY (EGD) WITH PROPOFOL N/A 02/09/2020   Procedure: ESOPHAGOGASTRODUODENOSCOPY (EGD) WITH PROPOFOL;  Surgeon: Lin Landsman, MD;  Location: [facility] ENDOSCOPY;  Service: Gastroenterology;  Laterality: N/A;   EXCISION MORTON'S NEUROMA Right 06/11/2020   Procedure: EXCISION MORTON'S NEUROMA;  Surgeon: Samara Deist, DPM;  Location: [facility] ORS;  Service: Podiatry;  Laterality: Right;   HAMMER TOE SURGERY Right 06/11/2020   Procedure: HAMMER TOE CORRECTION T6;  Surgeon: Samara Deist, DPM;  Location: [facility] ORS;  Service: Podiatry;  Laterality: Right;   ILIAC ARTERY STENT     right iliac artery stent    MASTECTOMY, PARTIAL Left 04/22/2018   Procedure: MASTECTOMY PARTIAL;  Surgeon: Robert Bellow, MD;  Location: [facility] ORS;  Service: General;  Laterality: Left;   ORIF PATELLA Left 03/22/2016   Dr. Tristan Schroeder   PARATHYROIDECTOMY   2012   [facility]   SENTINEL NODE BIOPSY Left 04/22/2018   Procedure: SENTINEL NODE BIOPSY;  Surgeon: Robert Bellow, MD;  Location: [facility] ORS;  Service: General;  Laterality: Left;   SKIN BIOPSY     precancerous    Medications:  Current Outpatient Medications on File Prior to Visit  Medication Sig   acetaminophen (TYLENOL) 650 MG CR tablet Take 1,300 mg by mouth daily as needed for pain.    aspirin 81 MG tablet Take 1 tablet (81 mg total) by mouth daily. (Patient taking differently: Take 81 mg by mouth at bedtime.)   CALCIUM-VITAMIN D PO Take 2 tablets by mouth daily.   fluticasone (FLONASE) 50 MCG/ACT nasal spray Place 2 sprays  into both nostrils daily.   letrozole (FEMARA) 2.5 MG tablet TAKE 1 TABLET(2.5 MG) BY MOUTH DAILY (Patient taking differently: Take 2.5 mg by mouth daily.)   Multiple Vitamin (MULTIVITAMIN WITH MINERALS) TABS tablet Take 1 tablet by mouth daily.   niacin 500 MG tablet Take 1,000 mg by mouth at bedtime.   omeprazole (PRILOSEC) 40 MG capsule TAKE 1 CAPSULE BY MOUTH  TWICE DAILY BEFORE A MEAL   Probiotic Product (PROBIOTIC-10 PO) Take by mouth daily.    traMADol (ULTRAM) 50 MG tablet Take 50 mg by mouth every 6 (six) hours as needed for moderate pain.    Turmeric 500 MG TABS Take 500 mg by mouth daily.    No current facility-administered medications on file prior to visit.    Allergies:  Allergies  Allergen Reactions   Alendronate Nausea Only and Other (See Comments)    Other reaction(s): NAUSEA GI upset   Amoxicillin-Pot Clavulanate Diarrhea   Crestor [Rosuvastatin] Nausea Only   Cyclobenzaprine Other (See Comments)    Vertigo, couldn't focus eyes   Erythromycin Other (See Comments)    GI upset    Fosamax [Alendronate Sodium] Nausea And Vomiting    Social History:  Social History   Socioeconomic History   Marital status: Married    Spouse name: Not on file   Number of children: Not on file   Years of education: Not on file   Highest education  level: High school graduate  Occupational History   Occupation: part time [employer]   Tobacco Use   Smoking status: Former    Packs/day: 1.00    Years: 42.00    Pack years: 42.00    Types: Cigarettes    Quit date: 08/28/2000    Years since quitting: 20.6   Smokeless tobacco: Never  Vaping Use   Vaping Use: Never used  Substance and Sexual Activity   Alcohol use: No   Drug use: No   Sexual activity: Not Currently  Other Topics Concern   Not on file  Social History Narrative   Working part time   Psychologist, occupational at the hospital on Mondays    Social Determinants of Health   Financial Resource Strain: Low Risk    Difficulty of Paying Living Expenses: Not hard at all  Food Insecurity: No Food Insecurity   Worried About Charity fundraiser in the Last Year: Never true   Arboriculturist in the Last Year: Never true  Transportation Needs: No Transportation Needs   Lack of Transportation (Medical): No   Lack of Transportation (Non-Medical): No  Physical Activity: Inactive   Days of Exercise per Week: 0 days   Minutes of Exercise per Session: 0 min  Stress: No Stress Concern Present   Feeling of Stress : Only a little  Social Connections: Engineer, building services of Communication with Friends and Family: More than three times a week   Frequency of Social Gatherings with Friends and Family: More than three times a week   Attends Religious Services: More than 4 times per year   Active Member of [employer] or Organizations: Yes   Attends Music therapist: More than 4 times per year   Marital Status: Married  Human resources officer Violence: Not At Risk   Fear of Current or Ex-Partner: No   Emotionally Abused: No   Physically Abused: No   Sexually Abused: No   Social History   Tobacco Use  Smoking Status Former   Packs/day: 1.00  Years: 42.00   Pack years: 42.00   Types: Cigarettes   Quit date: 08/28/2000   Years since quitting: 20.6  Smokeless Tobacco Never    Social History   Substance and Sexual Activity  Alcohol Use No    Family History:  Family History  Problem Relation Age of Onset   Stroke Mother    Hypertension Mother    Diabetes Mother    Heart attack Father 76   Alcohol abuse Father    Heart disease Father    Liver disease Father    Hypertension Father    Heart disease Sister 32       CAD; s/p stent placement   Stroke Sister    AAA (abdominal aortic aneurysm) Sister    Dementia Sister    Breast cancer Maternal Aunt 81   Bone cancer Paternal Uncle    Stroke Maternal Grandmother    Heart attack Maternal Grandfather    Stroke Paternal Grandmother    Breast cancer Cousin        mat cousin    Past medical history, surgical history, medications, allergies, family history and social history reviewed with patient today and changes made to appropriate areas of the chart.   Review of Systems  Constitutional: Negative.   HENT:  Positive for ear pain. Negative for congestion, ear discharge, hearing loss, nosebleeds, sinus pain, sore throat and tinnitus.   Eyes: Negative.   Respiratory: Negative.  Negative for stridor.   Cardiovascular: Negative.   Gastrointestinal:  Positive for constipation, diarrhea, heartburn and nausea. Negative for abdominal pain, blood in stool, melena and vomiting.  Genitourinary: Negative.   Musculoskeletal:  Positive for joint pain and myalgias. Negative for back pain, falls and neck pain.  Neurological: Negative.   Endo/Heme/Allergies:  Negative for environmental allergies and polydipsia. Bruises/bleeds easily.  Psychiatric/Behavioral: Negative.    All other ROS negative except what is listed above and in the HPI.      Objective:    BP 110/77   Pulse 67   Temp 99.3 F (37.4 C) (Oral)   Ht 4' 11.02" (1.499 m)   Wt 141 lb 9.6 oz (64.2 kg)   SpO2 98%   BMI 28.58 kg/m   Wt Readings from Last 3 Encounters:  04/26/21 141 lb 9.6 oz (64.2 kg)  04/08/21 141 lb (64 kg)  04/06/21 141 lb (64 kg)     Physical Exam Vitals and nursing note reviewed.  Constitutional:      General: She is not in acute distress.    Appearance: Normal appearance. She is not ill-appearing, toxic-appearing or diaphoretic.  HENT:     Head: Normocephalic and atraumatic.     Right Ear: Tympanic membrane, ear canal and external ear normal. There is no impacted cerumen.     Left Ear: Tympanic membrane, ear canal and external ear normal. There is no impacted cerumen.     Nose: Nose normal. No congestion or rhinorrhea.     Mouth/Throat:     Mouth: Mucous membranes are moist.     Pharynx: Oropharynx is clear. No oropharyngeal exudate or posterior oropharyngeal erythema.  Eyes:     General: No scleral icterus.       Right eye: No discharge.        Left eye: No discharge.     Extraocular Movements: Extraocular movements intact.     Conjunctiva/sclera: Conjunctivae normal.     Pupils: Pupils are equal, round, and reactive to light.  Neck:     Vascular: No  carotid bruit.  Cardiovascular:     Rate and Rhythm: Normal rate and regular rhythm.     Pulses: Normal pulses.     Heart sounds: No murmur heard.   No friction rub. No gallop.  Pulmonary:     Effort: Pulmonary effort is normal. No respiratory distress.     Breath sounds: Normal breath sounds. No stridor. No wheezing, rhonchi or rales.  Chest:     Chest wall: No tenderness.  Abdominal:     General: Abdomen is flat. Bowel sounds are normal. There is no distension.     Palpations: Abdomen is soft. There is no mass.     Tenderness: There is no abdominal tenderness. There is no right CVA tenderness, left CVA tenderness, guarding or rebound.     Hernia: No hernia is present.  Genitourinary:    Comments: Breast and pelvic exams deferred with shared decision making Musculoskeletal:        General: No swelling, tenderness, deformity or signs of injury.     Cervical back: Normal range of motion and neck supple. No rigidity. No muscular tenderness.     Right  lower leg: No edema.     Left lower leg: No edema.  Lymphadenopathy:     Cervical: No cervical adenopathy.  Skin:    General: Skin is warm and dry.     Capillary Refill: Capillary refill takes less than 2 seconds.     Coloration: Skin is not jaundiced or pale.     Findings: No bruising, erythema, lesion or rash.  Neurological:     General: No focal deficit present.     Mental Status: She is alert and oriented to person, place, and time. Mental status is at baseline.     Cranial Nerves: No cranial nerve deficit.     Sensory: No sensory deficit.     Motor: No weakness.     Coordination: Coordination normal.     Gait: Gait normal.     Deep Tendon Reflexes: Reflexes normal.  Psychiatric:        Mood and Affect: Mood normal.        Behavior: Behavior normal.        Thought Content: Thought content normal.        Judgment: Judgment normal.    Results for orders placed or performed in visit on 04/26/21  Urinalysis, Routine w reflex microscopic  Result Value Ref Range   Specific Gravity, UA <1.005 (L) 1.005 - 1.030   pH, UA 5.5 5.0 - 7.5   Color, UA Yellow Yellow   Appearance Ur Clear Clear   Leukocytes,UA Negative Negative   Protein,UA Negative Negative/Trace   Glucose, UA Negative Negative   Ketones, UA Negative Negative   RBC, UA Negative Negative   Bilirubin, UA Negative Negative   Urobilinogen, Ur 0.2 0.2 - 1.0 mg/dL   Nitrite, UA Negative Negative  Microalbumin, Urine Waived  Result Value Ref Range   Microalb, Ur Waived 30 (H) 0 - 19 mg/L   Creatinine, Urine Waived 200 10 - 300 mg/dL   Microalb/Creat Ratio <30 <30 mg/g      Assessment & Plan:   Problem List Items Addressed This Visit       Cardiovascular and Mediastinum   Senile purpura ([place])    Reassured patient. Continue to monitor.       Relevant Medications   hydrALAZINE (APRESOLINE) 25 MG tablet   benazepril (LOTENSIN) 40 MG tablet   atorvastatin (LIPITOR) 40 MG tablet  atenolol (TENORMIN) 50 MG tablet    amLODipine (NORVASC) 10 MG tablet     Digestive   GERD (gastroesophageal reflux disease)    Under good control on current regimen. Continue current regimen. Continue to monitor. Call with any concerns. Refills given. Labs drawn today.       Relevant Orders   CBC with Differential/Platelet   Comprehensive metabolic panel     Genitourinary   Benign hypertensive renal disease    Under good control on current regimen. Continue current regimen. Continue to monitor. Call with any concerns. Refills given. Labs drawn today.      Relevant Orders   CBC with Differential/Platelet   Comprehensive metabolic panel   Urinalysis, Routine w reflex microscopic (Completed)   TSH   Microalbumin, Urine Waived (Completed)     Other   Hyperlipidemia    Under good control on current regimen. Continue current regimen. Continue to monitor. Call with any concerns. Refills given. Labs drawn today.      Relevant Medications   hydrALAZINE (APRESOLINE) 25 MG tablet   benazepril (LOTENSIN) 40 MG tablet   atorvastatin (LIPITOR) 40 MG tablet   atenolol (TENORMIN) 50 MG tablet   amLODipine (NORVASC) 10 MG tablet   Other Relevant Orders   CBC with Differential/Platelet   Comprehensive metabolic panel   Lipid Panel w/o Chol/HDL Ratio   Gout    Under good control on current regimen. Continue current regimen. Continue to monitor. Call with any concerns. Refills given. Labs drawn today.      Relevant Orders   CBC with Differential/Platelet   Comprehensive metabolic panel   Uric acid   GAD (generalized anxiety disorder)    Under good control on current regimen. Continue current regimen. Continue to monitor. Call with any concerns. Refills given. 30 pills should last 3-6 months.       Relevant Medications   sertraline (ZOLOFT) 100 MG tablet   amitriptyline (ELAVIL) 25 MG tablet   Other Relevant Orders   CBC with Differential/Platelet   Comprehensive metabolic panel   TSH   Other Visit Diagnoses      Routine general medical examination at a health care facility    -  Primary   Vaccines up to date. Screening labs checked today. Mammogram and colonoscopy and DEXA up to date. Continue diet and exercise. Call with any concerns.     Generalized anxiety disorder       Relevant Medications   sertraline (ZOLOFT) 100 MG tablet   amitriptyline (ELAVIL) 25 MG tablet        Follow up plan: Return in about 6 months (around 10/25/2021).   LABORATORY TESTING:  - Pap smear: not applicable  IMMUNIZATIONS:   - Tdap: Tetanus vaccination status reviewed: last tetanus booster within 10 years. - Influenza: Postponed to flu season - Pneumovax: Up to date - Prevnar: Up to date - COVID: Up to date - Shingrix vaccine: Refused  SCREENING: -Mammogram: Up to date  - Colonoscopy: Up to date  - Bone Density: Up to date   PATIENT COUNSELING:   Advised to take 1 mg of folate supplement per day if capable of pregnancy.   Sexuality: Discussed sexually transmitted diseases, partner selection, use of condoms, avoidance of unintended pregnancy  and contraceptive alternatives.   Advised to avoid cigarette smoking.  I discussed with the patient that most people either abstain from alcohol or drink within safe limits (<=14/week and <=4 drinks/occasion for males, <=7/weeks and <= 3 drinks/occasion for females) and that the  risk for alcohol disorders and other health effects rises proportionally with the number of drinks per week and how often a drinker exceeds daily limits.  Discussed cessation/primary prevention of drug use and availability of treatment for abuse.   Diet: Encouraged to adjust caloric intake to maintain  or achieve ideal body weight, to reduce intake of dietary saturated fat and total fat, to limit sodium intake by avoiding high sodium foods and not adding table salt, and to maintain adequate dietary potassium and calcium preferably from fresh fruits, vegetables, and low-fat dairy products.     stressed the importance of regular exercise  Injury prevention: Discussed safety belts, safety helmets, smoke detector, smoking near bedding or upholstery.   Dental health: Discussed importance of regular tooth brushing, flossing, and dental visits.    NEXT PREVENTATIVE PHYSICAL DUE IN 1 YEAR. Return in about 6 months (around 10/25/2021).

## 2021-04-26 NOTE — Assessment & Plan Note (Addendum)
Under good control on current regimen. Continue current regimen. Continue to monitor. Call with any concerns. Refills given. 30 pills should last 3-6 months.

## 2021-04-26 NOTE — Assessment & Plan Note (Signed)
Reassured patient. Continue to monitor.  

## 2021-04-27 LAB — CBC WITH DIFFERENTIAL/PLATELET
Basophils Absolute: 0 10*3/uL (ref 0.0–0.2)
Basos: 0 %
EOS (ABSOLUTE): 0.3 10*3/uL (ref 0.0–0.4)
Eos: 5 %
Hematocrit: 36.7 % (ref 34.0–46.6)
Hemoglobin: 12.3 g/dL (ref 11.1–15.9)
Immature Grans (Abs): 0 10*3/uL (ref 0.0–0.1)
Immature Granulocytes: 0 %
Lymphocytes Absolute: 1 10*3/uL (ref 0.7–3.1)
Lymphs: 21 %
MCH: 29.6 pg (ref 26.6–33.0)
MCHC: 33.5 g/dL (ref 31.5–35.7)
MCV: 88 fL (ref 79–97)
Monocytes Absolute: 0.4 10*3/uL (ref 0.1–0.9)
Monocytes: 7 %
Neutrophils Absolute: 3.2 10*3/uL (ref 1.4–7.0)
Neutrophils: 67 %
Platelets: 225 10*3/uL (ref 150–450)
RBC: 4.16 x10E6/uL (ref 3.77–5.28)
RDW: 13.4 % (ref 11.7–15.4)
WBC: 4.8 10*3/uL (ref 3.4–10.8)

## 2021-04-27 LAB — COMPREHENSIVE METABOLIC PANEL
ALT: 20 IU/L (ref 0–32)
AST: 33 IU/L (ref 0–40)
Albumin/Globulin Ratio: 2 (ref 1.2–2.2)
Albumin: 4.5 g/dL (ref 3.7–4.7)
Alkaline Phosphatase: 100 IU/L (ref 44–121)
BUN/Creatinine Ratio: 14 (ref 12–28)
BUN: 12 mg/dL (ref 8–27)
Bilirubin Total: 0.4 mg/dL (ref 0.0–1.2)
CO2: 22 mmol/L (ref 20–29)
Calcium: 9 mg/dL (ref 8.7–10.3)
Chloride: 102 mmol/L (ref 96–106)
Creatinine, Ser: 0.84 mg/dL (ref 0.57–1.00)
Globulin, Total: 2.3 g/dL (ref 1.5–4.5)
Glucose: 105 mg/dL — ABNORMAL HIGH (ref 65–99)
Potassium: 3.9 mmol/L (ref 3.5–5.2)
Sodium: 144 mmol/L (ref 134–144)
Total Protein: 6.8 g/dL (ref 6.0–8.5)
eGFR: 73 mL/min/{1.73_m2} (ref >59)

## 2021-04-27 LAB — LIPID PANEL W/O CHOL/HDL RATIO
Cholesterol, Total: 129 mg/dL (ref 100–199)
HDL: 54 mg/dL (ref >39)
LDL Chol Calc (NIH): 58 mg/dL (ref 0–99)
Triglycerides: 87 mg/dL (ref 0–149)
VLDL Cholesterol Cal: 17 mg/dL (ref 5–40)

## 2021-04-27 LAB — URIC ACID: Uric Acid: 5.9 mg/dL (ref 3.1–7.9)

## 2021-04-27 LAB — TSH: TSH: 2.38 u[IU]/mL (ref 0.450–4.500)

## 2021-04-28 DIAGNOSIS — C50412 Malignant neoplasm of upper-outer quadrant of left female breast: Secondary | ICD-10-CM | POA: Diagnosis not present

## 2021-04-28 DIAGNOSIS — Z170 Estrogen receptor positive status [ER+]: Secondary | ICD-10-CM | POA: Diagnosis not present

## 2021-05-03 ENCOUNTER — Telehealth: Payer: Self-pay

## 2021-05-03 NOTE — Telephone Encounter (Signed)
Copied from [place] [phone]. Topic: General - Other >> May 03, 2021  2:13 PM Tessa Lerner A wrote: Reason for CRM: Patient has tested positive for COVDI 19 via at home test 05/02/21  The patient is experiencing discomfort related to a cough, congestion and an elevated temperature   Please contact further when possible   Patient needs an appointment scheduled virtually ASAP. Please call to schedule.

## 2021-05-06 ENCOUNTER — Telehealth (INDEPENDENT_AMBULATORY_CARE_PROVIDER_SITE_OTHER): Payer: Medicare Other | Admitting: Family Medicine

## 2021-05-06 ENCOUNTER — Encounter: Payer: Self-pay | Admitting: Family Medicine

## 2021-05-06 VITALS — BP 139/79 | HR 77 | Wt 141.0 lb

## 2021-05-06 DIAGNOSIS — U071 COVID-19: Secondary | ICD-10-CM

## 2021-05-06 MED ORDER — MOLNUPIRAVIR EUA 200MG CAPSULE: 4.0000 | ORAL_CAPSULE | Freq: Two times a day (BID) | ORAL | 0 refills | Status: AC

## 2021-05-06 MED ORDER — PREDNISONE 50 MG PO TABS: 50.0000 mg | ORAL_TABLET | Freq: Every day | ORAL | 0 refills | Status: DC

## 2021-05-06 MED ORDER — HYDROCOD POLST-CPM POLST ER 10-8 MG/5ML PO SUER: 5.0000 mL | Freq: Two times a day (BID) | ORAL | 0 refills | Status: DC | PRN

## 2021-05-06 MED ORDER — BENZONATATE 200 MG PO CAPS: 200.0000 mg | ORAL_CAPSULE | Freq: Two times a day (BID) | ORAL | 0 refills | Status: DC | PRN

## 2021-05-06 NOTE — Progress Notes (Signed)
BP 139/79   Pulse 77   Wt 141 lb (64 kg)   SpO2 94%   BMI 28.46 kg/m    Subjective:    Patient ID: Marie Parker, female    DOB: 03/12/1946, 75 y.o.   MRN: [id number]  HPI: Marie Parker is a 75 y.o. female  Chief Complaint  Patient presents with   Covid Positive    Pt states she tested positive for covid 05/02/21. States she had a fever, cough, and fatigue that started 05/01/21.    UPPER RESPIRATORY TRACT INFECTION Duration: 5 Worst symptom: fatigue, cough Fever: yes Cough: yes Shortness of breath: yes Wheezing: no Chest pain: yes, with cough Chest tightness: yes Chest congestion: yes Nasal congestion: yes Runny nose: yes Post nasal drip: yes Sneezing: no Sore throat: no Swollen glands: no Sinus pressure: yes Headache: no Face pain: no Toothache: no Ear pain: no  Ear pressure: yes "right Eyes red/itching:no Eye drainage/crusting: no  Vomiting: no Rash: no Fatigue: yes Sick contacts: yes Strep contacts: no  Context: stable Recurrent sinusitis: no Relief with OTC cold/cough medications: no  Treatments attempted: cold/sinus   Relevant past medical, surgical, family and social history reviewed and updated as indicated. Interim medical history since our last visit reviewed. Allergies and medications reviewed and updated.  Review of Systems  Constitutional: Negative.   Respiratory: Negative.    Cardiovascular: Negative.   Gastrointestinal: Negative.   Musculoskeletal: Negative.   Skin: Negative.   Psychiatric/Behavioral: Negative.     Per HPI unless specifically indicated above     Objective:    BP 139/79   Pulse 77   Wt 141 lb (64 kg)   SpO2 94%   BMI 28.46 kg/m   Wt Readings from Last 3 Encounters:  05/06/21 141 lb (64 kg)  04/26/21 141 lb 9.6 oz (64.2 kg)  04/08/21 141 lb (64 kg)    Physical Exam Vitals and nursing note reviewed.  Pulmonary:     Effort: Pulmonary effort is normal. No respiratory distress.     Comments: Speaking in  full sentences Neurological:     Mental Status: She is alert.  Psychiatric:        Mood and Affect: Mood normal.        Behavior: Behavior normal.        Thought Content: Thought content normal.        Judgment: Judgment normal.    Results for orders placed or performed in visit on 04/26/21  CBC with Differential/Platelet  Result Value Ref Range   WBC 4.8 3.4 - 10.8 x10E3/uL   RBC 4.16 3.77 - 5.28 x10E6/uL   Hemoglobin 12.3 11.1 - 15.9 g/dL   Hematocrit 36.7 34.0 - 46.6 %   MCV 88 79 - 97 fL   MCH 29.6 26.6 - 33.0 pg   MCHC 33.5 31.5 - 35.7 g/dL   RDW 13.4 11.7 - 15.4 %   Platelets 225 150 - 450 x10E3/uL   Neutrophils 67 Not Estab. %   Lymphs 21 Not Estab. %   Monocytes 7 Not Estab. %   Eos 5 Not Estab. %   Basos 0 Not Estab. %   Neutrophils Absolute 3.2 1.4 - 7.0 x10E3/uL   Lymphocytes Absolute 1.0 0.7 - 3.1 x10E3/uL   Monocytes Absolute 0.4 0.1 - 0.9 x10E3/uL   EOS (ABSOLUTE) 0.3 0.0 - 0.4 x10E3/uL   Basophils Absolute 0.0 0.0 - 0.2 x10E3/uL   Immature Granulocytes 0 Not Estab. %   Immature Grans (Abs) 0.0 0.0 -  0.1 x10E3/uL  Comprehensive metabolic panel  Result Value Ref Range   Glucose 105 (H) 65 - 99 mg/dL   BUN 12 8 - 27 mg/dL   Creatinine, Ser 0.84 0.57 - 1.00 mg/dL   eGFR 73 >59 mL/min/1.73   BUN/Creatinine Ratio 14 12 - 28   Sodium 144 134 - 144 mmol/L   Potassium 3.9 3.5 - 5.2 mmol/L   Chloride 102 96 - 106 mmol/L   CO2 22 20 - 29 mmol/L   Calcium 9.0 8.7 - 10.3 mg/dL   Total Protein 6.8 6.0 - 8.5 g/dL   Albumin 4.5 3.7 - 4.7 g/dL   Globulin, Total 2.3 1.5 - 4.5 g/dL   Albumin/Globulin Ratio 2.0 1.2 - 2.2   Bilirubin Total 0.4 0.0 - 1.2 mg/dL   Alkaline Phosphatase 100 44 - 121 IU/L   AST 33 0 - 40 IU/L   ALT 20 0 - 32 IU/L  Lipid Panel w/o Chol/HDL Ratio  Result Value Ref Range   Cholesterol, Total 129 100 - 199 mg/dL   Triglycerides 87 0 - 149 mg/dL   HDL 54 >39 mg/dL   VLDL Cholesterol Cal 17 5 - 40 mg/dL   LDL Chol Calc (NIH) 58 0 - 99 mg/dL   Urinalysis, Routine w reflex microscopic  Result Value Ref Range   Specific Gravity, UA <1.005 (L) 1.005 - 1.030   pH, UA 5.5 5.0 - 7.5   Color, UA Yellow Yellow   Appearance Ur Clear Clear   Leukocytes,UA Negative Negative   Protein,UA Negative Negative/Trace   Glucose, UA Negative Negative   Ketones, UA Negative Negative   RBC, UA Negative Negative   Bilirubin, UA Negative Negative   Urobilinogen, Ur 0.2 0.2 - 1.0 mg/dL   Nitrite, UA Negative Negative  TSH  Result Value Ref Range   TSH 2.380 0.450 - 4.500 uIU/mL  Microalbumin, Urine Waived  Result Value Ref Range   Microalb, Ur Waived 30 (H) 0 - 19 mg/L   Creatinine, Urine Waived 200 10 - 300 mg/dL   Microalb/Creat Ratio <30 <30 mg/g  Uric acid  Result Value Ref Range   Uric Acid 5.9 3.1 - 7.9 mg/dL      Assessment & Plan:   Problem List Items Addressed This Visit   None Visit Diagnoses     COVID-19    -  Primary   Will treat with mulnopirovir, prednisone, tussionex and tessalon. Call if not getting better or getting worse.         Follow up plan: Return if symptoms worsen or fail to improve.    This visit was completed via telephone due to the restrictions of the COVID-19 pandemic. All issues as above were discussed and addressed but no physical exam was performed. If it was felt that the patient should be evaluated in the office, they were directed there. The patient verbally consented to this visit. Patient was unable to complete an audio/visual visit due to Technical difficulties. Due to the catastrophic nature of the COVID-19 pandemic, this visit was done through audio contact only. Location of the patient: home Location of the provider: work Those involved with this call:  Provider: Park Liter, DO CMA: Yvonna Alanis, [place] Desk/Registration: Barth Kirks  Time spent on call:  21 minutes on the phone discussing health concerns. 30 minutes total spent in review of patient's record and preparation  of their chart.

## 2021-06-07 ENCOUNTER — Ambulatory Visit (INDEPENDENT_AMBULATORY_CARE_PROVIDER_SITE_OTHER): Payer: Medicare Other

## 2021-06-07 ENCOUNTER — Telehealth: Payer: Medicare Other | Admitting: General Practice

## 2021-06-07 ENCOUNTER — Telehealth: Payer: Self-pay

## 2021-06-07 DIAGNOSIS — N644 Mastodynia: Secondary | ICD-10-CM

## 2021-06-07 DIAGNOSIS — I2510 Atherosclerotic heart disease of native coronary artery without angina pectoris: Secondary | ICD-10-CM

## 2021-06-07 DIAGNOSIS — E782 Mixed hyperlipidemia: Secondary | ICD-10-CM

## 2021-06-07 DIAGNOSIS — F411 Generalized anxiety disorder: Secondary | ICD-10-CM

## 2021-06-07 DIAGNOSIS — M79671 Pain in right foot: Secondary | ICD-10-CM

## 2021-06-07 DIAGNOSIS — I10 Essential (primary) hypertension: Secondary | ICD-10-CM

## 2021-06-07 NOTE — Telephone Encounter (Signed)
  Care Management   Follow Up Note   06/07/2021 Name: Trinady Milewski MRN: [id number] DOB: May 08, 1946   Referred by: Valerie Roys, DO Reason for referral : Chronic Care Management (RNCM: Follow up for Chronic Disease Management and Care Coordination Needs)   Call made back to the patient and call was completed. See new encounter for information.   Follow Up Plan: Telephone follow up appointment with care management team member scheduled for: 08-09-2021 at [place] am  Noreene Larsson RN, MSN, [facility] Mobile: [phone]

## 2021-06-07 NOTE — Patient Instructions (Signed)
Visit Information  PATIENT GOALS:  Goals Addressed             This Visit's Progress    RNCM: Cope with Chronic Pain       Timeframe:  Long-Range Goal Priority:  High Start Date:        04-05-2021                     Expected End Date:     04-05-2022                  Follow Up Date 08-09-2021    - join a support group - learn how to meditate - learn relaxation techniques - practice acceptance of chronic pain - practice relaxation or meditation daily - spend time with positive people - tell myself I can (not I can't) - think of new ways to do favorite things - use distraction techniques - use relaxation during pain    Why is this important?   Stress makes chronic pain feel worse.  Feelings like depression, anxiety, stress and anger can make your body more sensitive to pain.  Learning ways to cope with stress or depression may help you find some relief from the pain.     Notes: 04-05-2021: Having pain in her right foot and saw podiatrist recently. States it hurts 'all the time", Tylenol sometimes effective and the patient states that she has taken tramadol that helps a lot. Denies any acute distress. Will discuss with the pcp possible refill for Tramadol. Was having some pain in left breast and was evaluated and found a lump. The MD states it is a fat necrosis. The patient had an [place] and will have a mammogram on the 23rd with follow up with the provider on 04-28-2021. Will continue to monitor. 06-07-2021: The patient denies any pain at this time. She is still working 3 days a week and has upcoming 3 days in a row. Is a little concerned about this. Discussed pacing activity and not over doing it. Will continue to monitor.      RNCM: Track and Manage My Symptoms-Depression       Timeframe:  Long-Range Goal Priority:  High Start Date:   12-01-2020                          Expected End Date:   12-01-2021                    Follow Up Date 08-09-2021   - avoid negative self-talk - develop a  personal safety plan - develop a plan to deal with triggers like holidays, anniversaries - exercise at least 2 to 3 times per week - have a plan for how to handle bad days - journal feelings and what helps to feel better or worse - spend time or talk with others at least 2 to 3 times per week - spend time or talk with others every day - watch for early signs of feeling worse    Why is this important?   Keeping track of your progress will help your treatment team find the right mix of medicine and therapy for you.  Write in your journal every day.  Day-to-day changes in depression symptoms are normal. It may be more helpful to check your progress at the end of each week instead of every day.     Notes: The patient states that her depression and anxiety  are stable.The patient is doing better since her husband is stable. She states since the last RNCM call she has only had to use 2 of her anxiety pills. 04-05-2021: The patient is stable. She has a current issue going on with her right ear and will follow up with the pcp on 04-06-2021.  The patient is at a good place.  Her husband is stable. She is working 3 days a week and enjoying this. She is pacing her activity and sleeping well. 06-07-2021: Is doing well with management of her depression. States that she had gone this am to get her hair fixed and took her aunt. The patient states she is doing well. The patient and her husband both had COVID but it was not bad. She is doing well now. She states she is doing well now. Denies any acute distress.         Patient verbalizes understanding of instructions provided today and agrees to view in [place].   Telephone follow up appointment with care management team member scheduled for: 08-09-2021 at [place] am  Noreene Larsson RN, MSN, [facility] Mobile: [phone]

## 2021-06-07 NOTE — Chronic Care Management (AMB) (Signed)
Chronic Care Management   CCM RN Visit Note  06/07/2021 Name: Marie Parker MRN: [id number] DOB: 1946-07-11  Subjective: Marie Parker is a 74 y.o. year old female who is a primary care patient of Valerie Roys, DO. The care management team was consulted for assistance with disease management and care coordination needs.    Engaged with patient by telephone for follow up visit in response to provider referral for case management and/or care coordination services.   Consent to Services:  The patient was given information about Chronic Care Management services, agreed to services, and gave verbal consent prior to initiation of services.  Please see initial visit note for detailed documentation.   Patient agreed to services and verbal consent obtained.   Assessment: Review of patient past medical history, allergies, medications, health status, including review of consultants reports, laboratory and other test data, was performed as part of comprehensive evaluation and provision of chronic care management services.   SDOH (Social Determinants of Health) assessments and interventions performed:    CCM Care Plan  Allergies  Allergen Reactions   Alendronate Nausea Only and Other (See Comments)    Other reaction(s): NAUSEA GI upset   Amoxicillin-Pot Clavulanate Diarrhea   Crestor [Rosuvastatin] Nausea Only   Cyclobenzaprine Other (See Comments)    Vertigo, couldn't focus eyes   Erythromycin Other (See Comments)    GI upset    Fosamax [Alendronate Sodium] Nausea And Vomiting    Outpatient Encounter Medications as of 06/07/2021  Medication Sig   acetaminophen (TYLENOL) 650 MG CR tablet Take 1,300 mg by mouth daily as needed for pain.    amitriptyline (ELAVIL) 25 MG tablet Take 1 tablet (25 mg total) by mouth at bedtime.   amLODipine (NORVASC) 10 MG tablet Take 1 tablet (10 mg total) by mouth daily.   aspirin 81 MG tablet Take 1 tablet (81 mg total) by mouth daily. (Patient  taking differently: Take 81 mg by mouth at bedtime.)   atenolol (TENORMIN) 50 MG tablet Take 1 tablet (50 mg total) by mouth daily.   atorvastatin (LIPITOR) 40 MG tablet Take 1 tablet (40 mg total) by mouth at bedtime.   benazepril (LOTENSIN) 40 MG tablet Take 1 tablet (40 mg total) by mouth daily.   benzonatate (TESSALON) 200 MG capsule Take 1 capsule (200 mg total) by mouth 2 (two) times daily as needed for cough.   CALCIUM-VITAMIN D PO Take 2 tablets by mouth daily.   chlorpheniramine-HYDROcodone (TUSSIONEX PENNKINETIC ER) 10-8 MG/5ML SUER Take 5 mLs by mouth every 12 (twelve) hours as needed.   clonazePAM (KLONOPIN) 0.5 MG tablet Take 0.5-1 tablets (0.25-0.5 mg total) by mouth 2 (two) times daily as needed for anxiety.   fluticasone (FLONASE) 50 MCG/ACT nasal spray Place 2 sprays into both nostrils daily.   hydrALAZINE (APRESOLINE) 25 MG tablet Take 1 tablet (25 mg total) by mouth 2 (two) times daily.   letrozole (FEMARA) 2.5 MG tablet TAKE 1 TABLET(2.5 MG) BY MOUTH DAILY (Patient taking differently: Take 2.5 mg by mouth daily.)   Multiple Vitamin (MULTIVITAMIN WITH MINERALS) TABS tablet Take 1 tablet by mouth daily.   niacin 500 MG tablet Take 1,000 mg by mouth at bedtime.   omeprazole (PRILOSEC) 40 MG capsule TAKE 1 CAPSULE BY MOUTH  TWICE DAILY BEFORE A MEAL   predniSONE (DELTASONE) 50 MG tablet Take 1 tablet (50 mg total) by mouth daily with breakfast.   Probiotic Product (PROBIOTIC-10 PO) Take by mouth daily.    sertraline (ZOLOFT) 100 MG  tablet Take 1.5 tablets (150 mg total) by mouth daily.   traMADol (ULTRAM) 50 MG tablet Take 50 mg by mouth every 6 (six) hours as needed for moderate pain.    Turmeric 500 MG TABS Take 500 mg by mouth daily.    No facility-administered encounter medications on file as of 06/07/2021.    Patient Active Problem List   Diagnosis Date Noted   Aortic atherosclerosis ([place]) 11/23/2020   History of colonic polyps    Morton's neuroma of right foot  11/28/2019   Osteoarthritis of elbow 11/28/2019   Bunion 09/09/2019   Osteopenia 06/17/2018   Malignant neoplasm of overlapping sites of left female breast ([place]) 04/10/2018   Liver cyst 02/22/2018   Senile purpura ([place]) 12/27/2017   GAD (generalized anxiety disorder) 06/21/2016   Personal history of tobacco use, presenting hazards to health 02/02/2016   Multiple pulmonary nodules determined by computed tomography of lung 12/21/2015   Benign hypertensive renal disease 06/22/2015   Hyperlipidemia 06/22/2015   Gout 27/78/2423   Lichen sclerosus 53/61/4431   GERD (gastroesophageal reflux disease) 01/22/2013   Peripheral vascular disease ([place]) 01/22/2013   CAD (coronary artery disease) 01/22/2013    Conditions to be addressed/monitored:CAD, HTN, HLD, Anxiety, Depression, and chronic pain  Care Plan : RNCM: Coronary Artery Disease (Adult) and HLD  Updates made by Vanita Ingles, RN since 06/07/2021 12:00 AM     Problem: RNCM: Disease Progression (Coronary Artery Disease) and HLD   Priority: Medium     Long-Range Goal: RNCM: Management of CAD and HLD   Start Date: 12/01/2020  Expected End Date: 12/01/2021  This Visit's Progress: On track  Recent Progress: On track  Priority: Medium  Note:   Current Barriers:  Poorly controlled hyperlipidemia, complicated by HTN, anxiety Current antihyperlipidemic regimen: Lipitor 40 mg QD Most recent lipid panel:     Component Value Date/Time   CHOL 129 04/26/2021 0832   CHOL 129 06/21/2016 0839   TRIG 87 04/26/2021 0832   TRIG 136 06/21/2016 0839   HDL 54 04/26/2021 0832   VLDL 27 06/21/2016 0839   LDLCALC 58 04/26/2021 0832   ASCVD risk enhancing conditions: age >70,  HTN,  former smoker Lacks Scientist, product/process development Does not contact provider office for questions/concerns RN Care Manager Clinical Goal(s):  patient will work with Consulting civil engineer, providers, and care team towards execution of optimized self-health management plan patient will  verbalize understanding of plan for effective management of HLD and CAD patient will work with [facility] and pcp to address needs related to effective management of HLD and CAD patient will attend all scheduled medical appointments: 10-25-2020 at 0820 am  Interventions: Collaboration with Valerie Roys, DO regarding development and update of comprehensive plan of care as evidenced by provider attestation and co-signature Inter-disciplinary care team collaboration (see longitudinal plan of care) Medication review performed; medication list updated in electronic medical record.  Inter-disciplinary care team collaboration (see longitudinal plan of care) Referred to pharmacy team for assistance with HLD and CAD medication management Evaluation of current treatment plan related to HLD and CAD and patient's adherence to plan as established by provider. 06-07-2021: The patient is doing well and denies any issues related to HLD and CAD. Is eating well and has gained weight back. Is compliant with the poc for HLD and CAD. Advised patient to call the office for changes in condition or questions Provided education to patient re: 06-07-2021: Reviewed- heart healthy diet, taking medications as prescribed and working with the  CCM team to optimize health and well being Reviewed scheduled/upcoming provider appointments including: 10-25-2021 at 0820 am Discussed plans with patient for ongoing care management follow up and provided patient with direct contact information for care management team Patient Goals/Self-Care Activities: - call for medicine refill 2 or 3 days before it runs out - call if I am sick and can't take my medicine - keep a list of all the medicines I take; vitamins and herbals too - learn to read medicine labels - use a pillbox to sort medicine - use an alarm clock or phone to remind me to take my medicine - drink 6 to 8 glasses of water each day - eat 3 to 5 servings of fruits and vegetables  each day - eat 5 or 6 small meals each day - fill half the plate with nonstarchy vegetables - limit fast food meals to no more than 1 per week - manage portion size - prepare main meal at home 3 to 5 days each week - read food labels for fat, fiber, carbohydrates and portion size - be open to making changes - I can manage, know and watch for signs of a heart attack - if I have chest pain, call for help - learn about small changes that will make a big difference - learn my personal risk factors - barriers to treatment adherence reviewed and addressed - functional limitation screening reviewed - healthy lifestyle promoted - medication-adherence assessment completed - medication side effects managed - rescue (action) plan developed - response to pharmacologic therapy monitored - self-awareness of signs/symptoms of worsening disease encouraged Follow Up Plan: Telephone follow up appointment with care management team member scheduled for: 08-09-2021 at 0945 am      Care Plan : RNCM: Hypertension (Adult)  Updates made by Vanita Ingles, RN since 06/07/2021 12:00 AM     Problem: RNCM: Hypertension (Hypertension)   Priority: Medium     Long-Range Goal: RNCM: Hypertension Monitored   Start Date: 12/01/2020  Expected End Date: 12/01/2021  This Visit's Progress: On track  Recent Progress: On track  Priority: Medium  Note:   Objective:  Last practice recorded BP readings:  BP Readings from Last 3 Encounters:  05/06/21 139/79  04/26/21 110/77  04/08/21 (!) 147/76    Most recent eGFR/CrCl: No results found for: EGFR  No components found for: CRCL Current Barriers:  Knowledge Deficits related to basic understanding of hypertension pathophysiology and self care management Knowledge Deficits related to understanding of medications prescribed for management of hypertension Limited Social Support Lacks social connections Does not contact provider office for questions/concerns Case  Manager Clinical Goal(s):  patient will verbalize understanding of plan for hypertension management patient will attend all scheduled medical appointments: 10-25-2021 at 0820 am patient will demonstrate improved adherence to prescribed treatment plan for hypertension as evidenced by taking all medications as prescribed, monitoring and recording blood pressure as directed, adhering to low sodium/DASH diet patient will demonstrate improved health management independence as evidenced by checking blood pressure as directed and notifying PCP if SBP>160 or DBP > 90, taking all medications as prescribe, and adhering to a low sodium diet as discussed. patient will verbalize basic understanding of hypertension disease process and self health management plan as evidenced by compliance with heart healthy diet, compliance with medications regimen and working with the CCM team to optimize health and well being.  Interventions:  Collaboration with Valerie Roys, DO regarding development and update of comprehensive plan of care as evidenced  by provider attestation and co-signature Inter-disciplinary care team collaboration (see longitudinal plan of care) Evaluation of current treatment plan related to hypertension self management and patient's adherence to plan as established by provider. 04-05-2021: The patient denies any issues with HTN. States she has been having good Bp readings. She states she and her husband had covid back in September but it was cold like symptoms and not bad at all. She feels like the vaccines helped a lot with her and her husband only having mild symptoms. Denies any concerns related to post COVID or HTN. Will continue to monitor.  Provided education to patient re: stroke prevention, s/s of heart attack and stroke, DASH diet, complications of uncontrolled blood pressure Reviewed medications with patient and discussed importance of compliance. 06-07-2021: Is compliant with medications   Discussed plans with patient for ongoing care management follow up and provided patient with direct contact information for care management team Advised patient, providing education and rationale, to monitor blood pressure daily and record, calling PCP for findings outside established parameters.  Reviewed scheduled/upcoming provider appointments including: 10-25-2021 Self-Care Activities: - Self administers medications as prescribed Attends all scheduled provider appointments Calls provider office for new concerns, questions, or BP outside discussed parameters Checks BP and records as discussed Follows a low sodium diet/DASH diet Patient Goals: - check blood pressure weekly - choose a place to take my blood pressure (home, clinic or office, retail store) - write blood pressure results in a log or diary - agree on reward when goals are met - agree to work together to make changes - ask questions to understand - have a family meeting to talk about healthy habits - learn about high blood pressure - blood pressure trends reviewed - depression screen reviewed - home or ambulatory blood pressure monitoring encouraged  Follow Up Plan: Telephone follow up appointment with care management team member scheduled for: 08-09-2021 at  0945 am    Care Plan : RNCM: Depression (Adult) and anxiety  Updates made by Vanita Ingles, RN since 06/07/2021 12:00 AM     Problem: RNCM: Depression Identification (Depression) and anxiety   Priority: Medium     Long-Range Goal: RNCM: Depressive Symptoms Identified and anxiety   Start Date: 12/01/2020  Expected End Date: 12/01/2021  This Visit's Progress: On track  Recent Progress: On track  Priority: Medium  Note:   Current Barriers:  Chronic Disease Management support and education needs related to depression and anxiety  Lacks caregiver support.  Lacks social connections Does not contact provider office for questions/concerns  Nurse Case Manager  Clinical Goal(s):  patient will verbalize understanding of plan for effective management of depression and anxeity  patient will work with [facility] and pcp  to address needs related to effective management of depression and anxiety  patient will attend all scheduled medical appointments: 10-25-2021 patient will demonstrate improved adherence to prescribed treatment plan for depression and anxiety  as evidenced bytaking medications as prescribed, regular MD appointments and working with the CCM team to optimize health and well being.   Interventions:  1:1 collaboration with Valerie Roys, DO regarding development and update of comprehensive plan of care as evidenced by provider attestation and co-signature Inter-disciplinary care team collaboration (see longitudinal plan of care) Evaluation of current treatment plan related to depression and anxiety  and patient's adherence to plan as established by provider. 04-05-2021: The patient feels she is in a good place right now with her depression and anxiety. The patient denies any issues  with exacerbation. She is working 3 days a week and is thankful her husband is doing so much better. The patient denies any new concerns except for some pain in her foot and elbow and possible ear infection she is currently dealing with. Will continue to monitor for changes. 06-07-2021: Was gone this am to get her hair done and take her aunt out. She is doing well. Has a 3 day in a row work day coming up and is a little concerned about that but states she will do this and then be off a few days. She tries to work it where she will have days in between working off. Discussed pacing activity. She denies any issues post COVID. Feels that she and her husband are doing well.  Advised patient to call the office for changes in mood/anxiety/ depression Provided education to patient re: effective management of depression and anxiety including diversion/meditation and faith.  Discussed how  the patient has been through a lot in the last couple of years and has overcome obstacles that she thought she would not be able to. Empathetic listening, and support.  Reviewed medications with patient and discussed compliance. The patient states she has not had to take but a couple of her pills since the last [facility] outreach. She says there was a time she needed them all the time but now she only takes when she absolutely has to have them. She is thankful that her life is more balanced now. 06-07-2021: The patient is compliant with medications.  Discussed plans with patient for ongoing care management follow up and provided patient with direct contact information for care management team  Patient Goals/Self-Care Activities  patient will:  - Patient will self administer medications as prescribed Patient will attend all scheduled provider appointments Patient will call pharmacy for medication refills Patient will attend church or other social activities Patient will continue to perform ADL's independently Patient will continue to perform IADL's independently Patient will call provider office for new concerns or questions Patient will work with BSW to address care coordination needs and will continue to work with the clinical team to address health care and disease management related needs.   - anxiety screen reviewed - depression screen reviewed - medication list reviewed  Follow Up Plan: Telephone follow up appointment with care management team member scheduled for: 08-09-2021 at 0945 am        Care Plan : RNCM Chronic Pain (Adult)  Updates made by Vanita Ingles, RN since 06/07/2021 12:00 AM     Problem: RNCM: Chronic Pain Management (Chronic Pain)   Priority: High  Onset Date: 04/05/2021     Long-Range Goal: Chronic Pain Managed   Start Date: 04/05/2021  Expected End Date: 04/05/2022  This Visit's Progress: On track  Recent Progress: On track  Priority: High  Note:   Current  Barriers:  Knowledge Deficits related to managing acute/chronic pain Non-adherence to scheduled provider appointments Non-adherence to prescribed medication regimen Difficulty obtaining medications Chronic Disease Management support and education needs related to chronic pain Unable to independently manage chronic pain to left breast and right foot, sometime elbow pain Nurse Case Manager Clinical Goal(s):  patient will verbalize understanding of plan for managing pain patient will work with [place] to address new concerns related to uncontrolled pain and discomfort  patient will attend all scheduled medical appointments: 10-25-2021 at 0820 am patient will demonstrate use of different relaxation  skills and/or diversional activities to assist with pain reduction (distraction,  imagery, relaxation, massage, acupressure, TENS, heat, and cold application patient will report pain at a level less than 3 to 4 on a 10-10 rating scale patient will use pharmacological and nonpharmacological pain relief strategies patient will verbalize acceptable level of pain relief and ability to engage in desired activities patient will engage in desired activities without an increase in pain level Interventions:  Collaboration with Valerie Roys, DO regarding development and update of comprehensive plan of care as evidenced by provider attestation and co-signature Inter-disciplinary care team collaboration (see longitudinal plan of care) - careful application of heat or ice encouraged - deep breathing, relaxation and mindfulness use promoted - effectiveness of pharmacologic therapy monitored - medication-induced side effects managed - misuse of pain medication assessed - motivation and barriers to change assessed and addressed - mutually acceptable comfort goal set - pain assessed - pain treatment goals reviewed - premedication prior to activity encouraged Evaluation of current treatment plan related  to pain in left breast and right foot, sometimes elbow  and patient's adherence to plan as established by provider. 04-05-2021: The patient states that she is having testing on her left breast and they say it is fat necrosis. The patient will have a mammogram on 04-19-2021 and follow up with the provider on 04-28-2021. She has had an ultrasound recently. She saw podiatrist recently and he states that it is muscle in her right foot that is causing the issue. Discussed heat application today to help with pain to elbow and foot. 06-07-2021: The patient denies any new issues with her pain. The patient states that she was having some right ear pain and it is better now. She thinks it is her sinuses and she noticed that each year it gets worse. She states sometimes she will have a headache over her eye, mainly right eye and that is the side she had the ear infection in. The patient states sometimes she takes mucinex for sinus drainage and has think drainage. Education on monitoring for changes in color and calling if dark brown or green noted. She states she also has nasal spray she uses. Education on discussing with pcp if she needs to take a regular medication like allegra or claratin since she has noticed that this is something that seems to get worse over time. Education and support  given.  Advised patient to discuss with the pcp the effectiveness of Tramadol she has had in the past and if it is acceptable to have a refill for Tramadol. The patient does take Tylenol, sometimes it helps sometimes it does not.  Provided education to patient re: alternative methods to pain control. The patient has not had good response from topical applications. She has not tried heat or cold but is considering it. Reviewed medications with patient and discussed compliance and discussing options with the pcp Discussed plans with patient for ongoing care management follow up and provided patient with direct contact information for care  management team Allow patient to maintain a diary of pain ratings, timing, precipitating events, medications, treatments, and what works best to relieve pain,  Refer to support groups and self-help groups Educate patient about the use of pharmacological interventions for pain management- antianxiety, antidepressants, NSAIDS, opioid analgesics,  Explain the importance of lifestyle modifications to effective pain management  Patient Goals/Self Care Activities:  - careful application of heat or ice encouraged - deep breathing, relaxation and mindfulness use promoted - effectiveness of pharmacologic therapy monitored - medication-induced side effects managed - misuse of pain  medication assessed - motivation and barriers to change assessed and addressed - mutually acceptable comfort goal set - pain assessed - pain treatment goals reviewed - premedication prior to activity encouraged Self-administers medications as prescribed Attends all scheduled provider appointments Calls pharmacy for medication refills Calls provider office for new concerns or questions Follow Up Plan: Telephone follow up appointment with care management team member scheduled for: 08-09-2021 at 0945 am       Plan:Telephone follow up appointment with care management team member scheduled for:  08-09-2021 at [place] am  Noreene Larsson RN, MSN, [facility] Mobile: [phone]

## 2021-06-15 DIAGNOSIS — M19071 Primary osteoarthritis, right ankle and foot: Secondary | ICD-10-CM | POA: Diagnosis not present

## 2021-06-24 ENCOUNTER — Ambulatory Visit: Payer: Medicare Other | Admitting: Licensed Clinical Social Worker

## 2021-06-24 DIAGNOSIS — F411 Generalized anxiety disorder: Secondary | ICD-10-CM

## 2021-06-24 DIAGNOSIS — I2510 Atherosclerotic heart disease of native coronary artery without angina pectoris: Secondary | ICD-10-CM

## 2021-06-24 DIAGNOSIS — E782 Mixed hyperlipidemia: Secondary | ICD-10-CM

## 2021-06-24 NOTE — Patient Instructions (Signed)
Visit Information   Goals Addressed               This Visit's Progress     Patient Stated     SW-I want to improve my self-care and decrease my anxiety (pt-stated)   On track     Patient Self Care Activities/Goals:  Attend all scheduled provider appointments Call provider office for new concerns or questions Continue compliance with medication management Continue utilizing healthy coping skills        Patient verbalizes understanding of instructions provided today and agrees to view in [place].   Telephone follow up appointment with care management team member scheduled for:09/22/21  Christa See, MSW, [email or web address] Phone [phone] 11:46 AM

## 2021-06-24 NOTE — Chronic Care Management (AMB) (Signed)
Chronic Care Management    Clinical Social Work Note  06/24/2021 Name: Marie Parker MRN: [id number] DOB: 01-Mar-1946  Marie Parker is a 75 y.o. year old female who is a primary care patient of Valerie Roys, DO. The CCM team was consulted to assist the patient with chronic disease management and/or care coordination needs related to: Mental Health Counseling and Resources.   Engaged with patient by telephone for follow up visit in response to provider referral for social work chronic care management and care coordination services.   Consent to Services:  The patient was given information about Chronic Care Management services, agreed to services, and gave verbal consent prior to initiation of services.  Please see initial visit note for detailed documentation.   Patient agreed to services and consent obtained.   Consent to Services:  The patient was given information about Care Management services, agreed to services, and gave verbal consent prior to initiation of services.  Please see initial visit note for detailed documentation.   Patient agreed to services today and consent obtained.  Engaged with patient by phone in response to provider referral for social work care coordination services:  Assessment/Interventions:  Patient continues to maintain positive progress with care plan goals. She reports decrease in anxiety symptoms with medication management. Spouse's health has improved and patient continues to receive strong support from family.  See Care Plan below for interventions and patient self-care activities.  Recommendation: Patient may benefit from, and is in agreement work with LCSW to address care coordination needs and will continue to work with the clinical team to address health care and disease management related needs.   Follow up Plan: Patient would like continued follow-up from CCM LCSW .  per patient's request will follow up in 09/22/21.  Will call office if  needed prior to next encounter.  SDOH (Social Determinants of Health) assessments and interventions performed:  NA  Advanced Directives Status: Not addressed in this encounter.  CCM Care Plan  Allergies  Allergen Reactions   Alendronate Nausea Only and Other (See Comments)    Other reaction(s): NAUSEA GI upset   Amoxicillin-Pot Clavulanate Diarrhea   Crestor [Rosuvastatin] Nausea Only   Cyclobenzaprine Other (See Comments)    Vertigo, couldn't focus eyes   Erythromycin Other (See Comments)    GI upset    Fosamax [Alendronate Sodium] Nausea And Vomiting    Outpatient Encounter Medications as of 06/24/2021  Medication Sig   acetaminophen (TYLENOL) 650 MG CR tablet Take 1,300 mg by mouth daily as needed for pain.    amitriptyline (ELAVIL) 25 MG tablet Take 1 tablet (25 mg total) by mouth at bedtime.   amLODipine (NORVASC) 10 MG tablet Take 1 tablet (10 mg total) by mouth daily.   aspirin 81 MG tablet Take 1 tablet (81 mg total) by mouth daily. (Patient taking differently: Take 81 mg by mouth at bedtime.)   atenolol (TENORMIN) 50 MG tablet Take 1 tablet (50 mg total) by mouth daily.   atorvastatin (LIPITOR) 40 MG tablet Take 1 tablet (40 mg total) by mouth at bedtime.   benazepril (LOTENSIN) 40 MG tablet Take 1 tablet (40 mg total) by mouth daily.   benzonatate (TESSALON) 200 MG capsule Take 1 capsule (200 mg total) by mouth 2 (two) times daily as needed for cough.   CALCIUM-VITAMIN D PO Take 2 tablets by mouth daily.   chlorpheniramine-HYDROcodone (TUSSIONEX PENNKINETIC ER) 10-8 MG/5ML SUER Take 5 mLs by mouth every 12 (twelve) hours as needed.  clonazePAM (KLONOPIN) 0.5 MG tablet Take 0.5-1 tablets (0.25-0.5 mg total) by mouth 2 (two) times daily as needed for anxiety.   fluticasone (FLONASE) 50 MCG/ACT nasal spray Place 2 sprays into both nostrils daily.   hydrALAZINE (APRESOLINE) 25 MG tablet Take 1 tablet (25 mg total) by mouth 2 (two) times daily.   letrozole (FEMARA) 2.5 MG  tablet TAKE 1 TABLET(2.5 MG) BY MOUTH DAILY (Patient taking differently: Take 2.5 mg by mouth daily.)   Multiple Vitamin (MULTIVITAMIN WITH MINERALS) TABS tablet Take 1 tablet by mouth daily.   niacin 500 MG tablet Take 1,000 mg by mouth at bedtime.   omeprazole (PRILOSEC) 40 MG capsule TAKE 1 CAPSULE BY MOUTH  TWICE DAILY BEFORE A MEAL   predniSONE (DELTASONE) 50 MG tablet Take 1 tablet (50 mg total) by mouth daily with breakfast.   Probiotic Product (PROBIOTIC-10 PO) Take by mouth daily.    sertraline (ZOLOFT) 100 MG tablet Take 1.5 tablets (150 mg total) by mouth daily.   traMADol (ULTRAM) 50 MG tablet Take 50 mg by mouth every 6 (six) hours as needed for moderate pain.    Turmeric 500 MG TABS Take 500 mg by mouth daily.    No facility-administered encounter medications on file as of 06/24/2021.    Patient Active Problem List   Diagnosis Date Noted   Aortic atherosclerosis ([place]) 11/23/2020   History of colonic polyps    Morton's neuroma of right foot 11/28/2019   Osteoarthritis of elbow 11/28/2019   Bunion 09/09/2019   Osteopenia 06/17/2018   Malignant neoplasm of overlapping sites of left female breast ([place]) 04/10/2018   Liver cyst 02/22/2018   Senile purpura ([place]) 12/27/2017   GAD (generalized anxiety disorder) 06/21/2016   Personal history of tobacco use, presenting hazards to health 02/02/2016   Multiple pulmonary nodules determined by computed tomography of lung 12/21/2015   Benign hypertensive renal disease 06/22/2015   Hyperlipidemia 06/22/2015   Gout 41/74/0814   Lichen sclerosus 48/18/5631   GERD (gastroesophageal reflux disease) 01/22/2013   Peripheral vascular disease ([place]) 01/22/2013   CAD (coronary artery disease) 01/22/2013    Conditions to be addressed/monitored: Anxiety; Mental Health Concerns   Care Plan : General Social Work (Adult)  Updates made by Rebekah Chesterfield, LCSW since 06/24/2021 12:00 AM     Problem: SW- I want to continue taking good care of  myself   Priority: Medium  Onset Date: 07/26/2020     Long-Range Goal: Anxiety Symptoms Monitored and Managed   Start Date: 10/21/2020  This Visit's Progress: On track  Recent Progress: On track  Priority: Medium  Note:   Timeframe:  Long-Range Goal Priority:  Medium  Start Date:  10/21/20                         Expected End Date:  04/27/21                    Follow Up Date- 06/24/21  Current Barriers:  Financial constraints Limited social support Level of care concerns Mental Health Concerns  Social Isolation Limited access to caregiver- in terms of spouse Lacks knowledge of community resource: available personal care service resources and [facility] resource connection within the area  Clinical Social Work Clinical Goal(s):  Over the next 90 days, client will work with SW to address concerns related to reducing anxiety and improving self-care  Interventions: Patient interviewed and appropriate assessments performed Patient reports she been "very well" She recently  had cataracts surgery in both eyes stating, it was "the best thing I ever done. I can see so much better" Patient currently uses reading glasses while at work part-time Patient reported that her anxiety was triggered earlier in the year when spouse was very sick. She often felt uncomfortable preparing his heart medications; however, his medications were adjusted to an inhaler, which has worked effectively for spouse. Patient shared that his health has since improved 10/28: Patient reports, ""I've done very well" regarding management of anxiety. Pt contributes this to compliance with med management, stating "It has helped tremendously" Pt's spouse is doing well and is strong enough to participate in radiation to tx prostrate cancer.  Caregiver stress was acknowledged. CCM LCSW provided validation and encouragement Patient received flu shot yesterday and was recently administered cortisone shot to assist with pain management in  foot. Patient is intentional about not overly exerting self and utilizes OTC medicine for pain, if needed Patient continues to receive strong support from family Patient denies any current stressors or resource needs Depression screen reviewed  Mindfulness or Relaxation training provided Active listening / Reflection utilized  Emotional Support Provided Verbalization of feelings encouraged  LCSW provided reflective listening and implemented appropriate interventions to help suppport patient and her emotional needs  Discussed plans with patient for ongoing care management follow up and provided patient with direct contact information for care management team Patient Self Care Activities/Goals:  Attend all scheduled provider appointments Call provider office for new concerns or questions Continue compliance with medication management Continue utilizing healthy coping skills      Christa See, MSW, [email or web address] Phone [phone] 11:44 AM

## 2021-06-27 DIAGNOSIS — I10 Essential (primary) hypertension: Secondary | ICD-10-CM

## 2021-06-27 DIAGNOSIS — I2510 Atherosclerotic heart disease of native coronary artery without angina pectoris: Secondary | ICD-10-CM

## 2021-06-27 DIAGNOSIS — E782 Mixed hyperlipidemia: Secondary | ICD-10-CM

## 2021-08-09 ENCOUNTER — Telehealth: Payer: Medicare Other | Admitting: General Practice

## 2021-08-09 ENCOUNTER — Ambulatory Visit: Payer: Self-pay

## 2021-08-09 DIAGNOSIS — F411 Generalized anxiety disorder: Secondary | ICD-10-CM

## 2021-08-09 DIAGNOSIS — F419 Anxiety disorder, unspecified: Secondary | ICD-10-CM

## 2021-08-09 DIAGNOSIS — E782 Mixed hyperlipidemia: Secondary | ICD-10-CM

## 2021-08-09 DIAGNOSIS — M25562 Pain in left knee: Secondary | ICD-10-CM

## 2021-08-09 DIAGNOSIS — I10 Essential (primary) hypertension: Secondary | ICD-10-CM

## 2021-08-09 DIAGNOSIS — G8929 Other chronic pain: Secondary | ICD-10-CM

## 2021-08-09 DIAGNOSIS — I2510 Atherosclerotic heart disease of native coronary artery without angina pectoris: Secondary | ICD-10-CM

## 2021-08-09 NOTE — Patient Instructions (Signed)
Visit Information  Thank you for taking time to visit with me today. Please don't hesitate to contact me if I can be of assistance to you before our next scheduled telephone appointment.  Following are the goals we discussed today:  Care Plan : RNCM: Coronary Artery Disease (Adult) and HLD  Updates made by Vanita Ingles, RN since 08/09/2021 12:00 AM       Problem: RNCM: Disease Progression (Coronary Artery Disease) and HLD    Priority: Medium       Long-Range Goal: RNCM: Management of CAD and HLD    Start Date: 12/01/2020  Expected End Date: 12/01/2021  Recent Progress: On track  Priority: Medium  Note:   Current Barriers:  Poorly controlled hyperlipidemia, complicated by HTN, anxiety Current antihyperlipidemic regimen: Lipitor 40 mg QD Most recent lipid panel:          Component Value Date/Time    CHOL 129 04/26/2021 0832    CHOL 129 06/21/2016 0839    TRIG 87 04/26/2021 0832    TRIG 136 06/21/2016 0839    HDL 54 04/26/2021 0832    VLDL 27 06/21/2016 0839    LDLCALC 58 04/26/2021 0832    ASCVD risk enhancing conditions: age >20,  HTN,  former smoker Lacks Scientist, product/process development Does not contact provider office for questions/concerns RN Care Manager Clinical Goal(s):  patient will work with Consulting civil engineer, providers, and care team towards execution of optimized self-health management plan patient will verbalize understanding of plan for effective management of HLD and CAD patient will work with [facility] and pcp to address needs related to effective management of HLD and CAD patient will attend all scheduled medical appointments: 10-25-2020 at 0820 am  Interventions: Collaboration with Valerie Roys, DO regarding development and update of comprehensive plan of care as evidenced by provider attestation and co-signature Inter-disciplinary care team collaboration (see longitudinal plan of care) Medication review performed; medication list updated in electronic medical record.   Inter-disciplinary care team collaboration (see longitudinal plan of care) Referred to pharmacy team for assistance with HLD and CAD medication management Evaluation of current treatment plan related to HLD and CAD and patient's adherence to plan as established by provider. 08-09-2021: The patient is doing well and denies any issues related to HLD and CAD. Is eating well and has gained weight back. Is compliant with the poc for HLD and CAD. Advised patient to call the office for changes in condition or questions Provided education to patient re: 08-09-2021: Reviewed- heart healthy diet, taking medications as prescribed and working with the CCM team to optimize health and well being Reviewed scheduled/upcoming provider appointments including: 10-25-2021 at 0820 am Discussed plans with patient for ongoing care management follow up and provided patient with direct contact information for care management team Patient Goals/Self-Care Activities: - call for medicine refill 2 or 3 days before it runs out - call if I am sick and can't take my medicine - keep a list of all the medicines I take; vitamins and herbals too - learn to read medicine labels - use a pillbox to sort medicine - use an alarm clock or phone to remind me to take my medicine - drink 6 to 8 glasses of water each day - eat 3 to 5 servings of fruits and vegetables each day - eat 5 or 6 small meals each day - fill half the plate with nonstarchy vegetables - limit fast food meals to no more than 1 per week - manage portion  size - prepare main meal at home 3 to 5 days each week - read food labels for fat, fiber, carbohydrates and portion size - be open to making changes - I can manage, know and watch for signs of a heart attack - if I have chest pain, call for help - learn about small changes that will make a big difference - learn my personal risk factors - barriers to treatment adherence reviewed and addressed - functional  limitation screening reviewed - healthy lifestyle promoted - medication-adherence assessment completed - medication side effects managed - rescue (action) plan developed - response to pharmacologic therapy monitored - self-awareness of signs/symptoms of worsening disease encouraged        Care Plan : RNCM: Hypertension (Adult)  Updates made by Vanita Ingles, RN since 08/09/2021 12:00 AM       Problem: RNCM: Hypertension (Hypertension)    Priority: Medium       Long-Range Goal: RNCM: Hypertension Monitored    Start Date: 12/01/2020  Expected End Date: 12/01/2021  Recent Progress: On track  Priority: Medium  Note:   Objective:  Last practice recorded BP readings:     BP Readings from Last 3 Encounters:  05/06/21 139/79  04/26/21 110/77  04/08/21 (!) 147/76    Most recent eGFR/CrCl: No results found for: EGFR  No components found for: CRCL Current Barriers:  Knowledge Deficits related to basic understanding of hypertension pathophysiology and self care management Knowledge Deficits related to understanding of medications prescribed for management of hypertension Limited Social Support Lacks social connections Does not contact provider office for questions/concerns Case Manager Clinical Goal(s):  patient will verbalize understanding of plan for hypertension management patient will attend all scheduled medical appointments: 10-25-2021 at 0820 am patient will demonstrate improved adherence to prescribed treatment plan for hypertension as evidenced by taking all medications as prescribed, monitoring and recording blood pressure as directed, adhering to low sodium/DASH diet patient will demonstrate improved health management independence as evidenced by checking blood pressure as directed and notifying PCP if SBP>160 or DBP > 90, taking all medications as prescribe, and adhering to a low sodium diet as discussed. patient will verbalize basic understanding of hypertension disease  process and self health management plan as evidenced by compliance with heart healthy diet, compliance with medications regimen and working with the CCM team to optimize health and well being.  Interventions:  Collaboration with Valerie Roys, DO regarding development and update of comprehensive plan of care as evidenced by provider attestation and co-signature Inter-disciplinary care team collaboration (see longitudinal plan of care) Evaluation of current treatment plan related to hypertension self management and patient's adherence to plan as established by provider. 04-05-2021: The patient denies any issues with HTN. States she has been having good Bp readings. She states she and her husband had covid back in September but it was cold like symptoms and not bad at all. She feels like the vaccines helped a lot with her and her husband only having mild symptoms. Denies any concerns related to post COVID or HTN. Will continue to monitor. 08-09-2021: The patient is doing well and denies any acute distress. The patient states that she is having better blood pressure readings.  Provided education to patient re: stroke prevention, s/s of heart attack and stroke, DASH diet, complications of uncontrolled blood pressure Reviewed medications with patient and discussed importance of compliance. 08-09-2021: Is compliant with medications  Discussed plans with patient for ongoing care management follow up and provided patient with  direct contact information for care management team Advised patient, providing education and rationale, to monitor blood pressure daily and record, calling PCP for findings outside established parameters.  Reviewed scheduled/upcoming provider appointments including: 10-25-2021 Self-Care Activities: - Self administers medications as prescribed Attends all scheduled provider appointments Calls provider office for new concerns, questions, or BP outside discussed parameters Checks BP and  records as discussed Follows a low sodium diet/DASH diet Patient Goals: - check blood pressure weekly - choose a place to take my blood pressure (home, clinic or office, retail store) - write blood pressure results in a log or diary - agree on reward when goals are met - agree to work together to make changes - ask questions to understand - have a family meeting to talk about healthy habits - learn about high blood pressure - blood pressure trends reviewed - depression screen reviewed - home or ambulatory blood pressure monitoring encouraged         Care Plan : RNCM: Depression (Adult) and anxiety  Updates made by Vanita Ingles, RN since 08/09/2021 12:00 AM       Problem: RNCM: Depression Identification (Depression) and anxiety    Priority: Medium       Long-Range Goal: RNCM: Depressive Symptoms Identified and anxiety    Start Date: 12/01/2020  Expected End Date: 12/01/2021  Recent Progress: On track  Priority: Medium  Note:   Current Barriers:  Chronic Disease Management support and education needs related to depression and anxiety  Lacks caregiver support.  Lacks social connections Does not contact provider office for questions/concerns   Nurse Case Manager Clinical Goal(s):  patient will verbalize understanding of plan for effective management of depression and anxeity  patient will work with [facility] and pcp  to address needs related to effective management of depression and anxiety  patient will attend all scheduled medical appointments: 10-25-2021 patient will demonstrate improved adherence to prescribed treatment plan for depression and anxiety  as evidenced bytaking medications as prescribed, regular MD appointments and working with the CCM team to optimize health and well being.    Interventions:  1:1 collaboration with Valerie Roys, DO regarding development and update of comprehensive plan of care as evidenced by provider attestation and  co-signature Inter-disciplinary care team collaboration (see longitudinal plan of care) Evaluation of current treatment plan related to depression and anxiety  and patient's adherence to plan as established by provider. 04-05-2021: The patient feels she is in a good place right now with her depression and anxiety. The patient denies any issues with exacerbation. She is working 3 days a week and is thankful her husband is doing so much better. The patient denies any new concerns except for some pain in her foot and elbow and possible ear infection she is currently dealing with. Will continue to monitor for changes. 06-07-2021: Was gone this am to get her hair done and take her aunt out. She is doing well. Has a 3 day in a row work day coming up and is a little concerned about that but states she will do this and then be off a few days. She tries to work it where she will have days in between working off. Discussed pacing activity. She denies any issues post COVID. Feels that she and her husband are doing well. 08-09-2021: Is doing very well. The patient states that she is getting some shopping done and she is feeling great. Her husband is doing well. She did have a fall last week and  hit her left knee but she says other than some pain and bruising she is okay. Will continue to monitor.  Advised patient to call the office for changes in mood/anxiety/ depression Provided education to patient re: effective management of depression and anxiety including diversion/meditation and faith.  Discussed how the patient has been through a lot in the last couple of years and has overcome obstacles that she thought she would not be able to. Empathetic listening, and support.  Reviewed medications with patient and discussed compliance. The patient states she has not had to take but a couple of her pills since the last [facility] outreach. She says there was a time she needed them all the time but now she only takes when she absolutely  has to have them. She is thankful that her life is more balanced now. 08-09-2021: The patient is compliant with medications.  Discussed plans with patient for ongoing care management follow up and provided patient with direct contact information for care management team   Patient Goals/Self-Care Activities  patient will:  - Patient will self administer medications as prescribed Patient will attend all scheduled provider appointments Patient will call pharmacy for medication refills Patient will attend church or other social activities Patient will continue to perform ADL's independently Patient will continue to perform IADL's independently Patient will call provider office for new concerns or questions Patient will work with BSW to address care coordination needs and will continue to work with the clinical team to address health care and disease management related needs.   - anxiety screen reviewed - depression screen reviewed - medication list reviewed            Care Plan : RNCM Chronic Pain (Adult)  Updates made by Vanita Ingles, RN since 08/09/2021 12:00 AM       Problem: RNCM: Chronic Pain Management (Chronic Pain)    Priority: High  Onset Date: 04/05/2021       Long-Range Goal: Chronic Pain Managed    Start Date: 04/05/2021  Expected End Date: 04/05/2022  Recent Progress: On track  Priority: High  Note:   Current Barriers:  Knowledge Deficits related to managing acute/chronic pain Non-adherence to scheduled provider appointments Non-adherence to prescribed medication regimen Difficulty obtaining medications Chronic Disease Management support and education needs related to chronic pain Unable to independently manage chronic pain to left breast and right foot, sometime elbow pain Nurse Case Manager Clinical Goal(s):  patient will verbalize understanding of plan for managing pain patient will work with RN Care Manager to address new concerns related to uncontrolled pain and  discomfort  patient will attend all scheduled medical appointments: 10-25-2021 at 0820 am patient will demonstrate use of different relaxation  skills and/or diversional activities to assist with pain reduction (distraction, imagery, relaxation, massage, acupressure, TENS, heat, and cold application patient will report pain at a level less than 3 to 4 on a 10-10 rating scale patient will use pharmacological and nonpharmacological pain relief strategies patient will verbalize acceptable level of pain relief and ability to engage in desired activities patient will engage in desired activities without an increase in pain level Interventions:  Collaboration with Valerie Roys, DO regarding development and update of comprehensive plan of care as evidenced by provider attestation and co-signature Inter-disciplinary care team collaboration (see longitudinal plan of care) - careful application of heat or ice encouraged - deep breathing, relaxation and mindfulness use promoted - effectiveness of pharmacologic therapy monitored - medication-induced side effects managed - misuse of  pain medication assessed - motivation and barriers to change assessed and addressed - mutually acceptable comfort goal set - pain assessed - pain treatment goals reviewed - premedication prior to activity encouraged Evaluation of current treatment plan related to pain in left breast and right foot, sometimes elbow  and patient's adherence to plan as established by provider. 04-05-2021: The patient states that she is having testing on her left breast and they say it is fat necrosis. The patient will have a mammogram on 04-19-2021 and follow up with the provider on 04-28-2021. She has had an ultrasound recently. She saw podiatrist recently and he states that it is muscle in her right foot that is causing the issue. Discussed heat application today to help with pain to elbow and foot. 06-07-2021: The patient denies any new issues with  her pain. The patient states that she was having some right ear pain and it is better now. She thinks it is her sinuses and she noticed that each year it gets worse. She states sometimes she will have a headache over her eye, mainly right eye and that is the side she had the ear infection in. The patient states sometimes she takes mucinex for sinus drainage and has think drainage. Education on monitoring for changes in color and calling if dark brown or green noted. She states she also has nasal spray she uses. Education on discussing with pcp if she needs to take a regular medication like allegra or claratin since she has noticed that this is something that seems to get worse over time. Education and support  given. 08-09-2021: The patient had a fall last week at work and landed on her left knee, the one she had surgery on. The patient states it is bruised pretty bad but other than that she is okay. Denies pain but states at times with movement it hurts some. She did not go to the doctor or call the doctor. Will continue to monitor.  Advised patient to discuss with the pcp the effectiveness of Tramadol she has had in the past and if it is acceptable to have a refill for Tramadol. The patient does take Tylenol, sometimes it helps sometimes it does not.  Provided education to patient re: alternative methods to pain control. The patient has not had good response from topical applications. She has not tried heat or cold but is considering it. Reviewed medications with patient and discussed compliance and discussing options with the pcp Discussed plans with patient for ongoing care management follow up and provided patient with direct contact information for care management team Allow patient to maintain a diary of pain ratings, timing, precipitating events, medications, treatments, and what works best to relieve pain,  Refer to support groups and self-help groups Educate patient about the use of pharmacological  interventions for pain management- antianxiety, antidepressants, NSAIDS, opioid analgesics,  Explain the importance of lifestyle modifications to effective pain management  Patient Goals/Self Care Activities:  - careful application of heat or ice encouraged - deep breathing, relaxation and mindfulness use promoted - effectiveness of pharmacologic therapy monitored - medication-induced side effects managed - misuse of pain medication assessed - motivation and barriers to change assessed and addressed - mutually acceptable comfort goal set - pain assessed - pain treatment goals reviewed - premedication prior to activity encouraged Self-administers medications as prescribed Attends all scheduled provider appointments Calls pharmacy for medication refills Calls provider office for new concerns or questions    Our next appointment is by telephone  on 10-11-2021 at 0945 am  Please call the care guide team at [phone] if you need to cancel or reschedule your appointment.   If you are experiencing a Mental Health or [place] or need someone to talk to, please call the Suicide and Crisis Lifeline: 988 call the [place] National Suicide Prevention Lifeline: 740-550-2434 or TTY: 671 237 0790 TTY (581) 121-8276) to talk to a trained counselor call 1-800-273-TALK (toll free, 24 hour hotline)   Patient verbalizes understanding of instructions provided today and agrees to view in [place].   Noreene Larsson RN, MSN, [facility] Mobile: [phone]

## 2021-08-09 NOTE — Chronic Care Management (AMB) (Signed)
Chronic Care Management   CCM RN Visit Note  08/09/2021 Name: Marie Parker MRN: [id number] DOB: Apr 17, 1946  Subjective: Marie Parker is a 75 y.o. year old female who is a primary care patient of Valerie Roys, DO. The care management team was consulted for assistance with disease management and care coordination needs.    Engaged with patient by telephone for follow up visit in response to provider referral for case management and/or care coordination services.   Consent to Services:  The patient was given information about Chronic Care Management services, agreed to services, and gave verbal consent prior to initiation of services.  Please see initial visit note for detailed documentation.   Patient agreed to services and verbal consent obtained.   Assessment: Review of patient past medical history, allergies, medications, health status, including review of consultants reports, laboratory and other test data, was performed as part of comprehensive evaluation and provision of chronic care management services.   SDOH (Social Determinants of Health) assessments and interventions performed:    CCM Care Plan  Allergies  Allergen Reactions   Alendronate Nausea Only and Other (See Comments)    Other reaction(s): NAUSEA GI upset   Amoxicillin-Pot Clavulanate Diarrhea   Crestor [Rosuvastatin] Nausea Only   Cyclobenzaprine Other (See Comments)    Vertigo, couldn't focus eyes   Erythromycin Other (See Comments)    GI upset    Fosamax [Alendronate Sodium] Nausea And Vomiting    Outpatient Encounter Medications as of 08/09/2021  Medication Sig   acetaminophen (TYLENOL) 650 MG CR tablet Take 1,300 mg by mouth daily as needed for pain.    amitriptyline (ELAVIL) 25 MG tablet Take 1 tablet (25 mg total) by mouth at bedtime.   amLODipine (NORVASC) 10 MG tablet Take 1 tablet (10 mg total) by mouth daily.   aspirin 81 MG tablet Take 1 tablet (81 mg total) by mouth daily. (Patient  taking differently: Take 81 mg by mouth at bedtime.)   atenolol (TENORMIN) 50 MG tablet Take 1 tablet (50 mg total) by mouth daily.   atorvastatin (LIPITOR) 40 MG tablet Take 1 tablet (40 mg total) by mouth at bedtime.   benazepril (LOTENSIN) 40 MG tablet Take 1 tablet (40 mg total) by mouth daily.   benzonatate (TESSALON) 200 MG capsule Take 1 capsule (200 mg total) by mouth 2 (two) times daily as needed for cough.   CALCIUM-VITAMIN D PO Take 2 tablets by mouth daily.   chlorpheniramine-HYDROcodone (TUSSIONEX PENNKINETIC ER) 10-8 MG/5ML SUER Take 5 mLs by mouth every 12 (twelve) hours as needed.   clonazePAM (KLONOPIN) 0.5 MG tablet Take 0.5-1 tablets (0.25-0.5 mg total) by mouth 2 (two) times daily as needed for anxiety.   fluticasone (FLONASE) 50 MCG/ACT nasal spray Place 2 sprays into both nostrils daily.   hydrALAZINE (APRESOLINE) 25 MG tablet Take 1 tablet (25 mg total) by mouth 2 (two) times daily.   letrozole (FEMARA) 2.5 MG tablet TAKE 1 TABLET(2.5 MG) BY MOUTH DAILY (Patient taking differently: Take 2.5 mg by mouth daily.)   Multiple Vitamin (MULTIVITAMIN WITH MINERALS) TABS tablet Take 1 tablet by mouth daily.   niacin 500 MG tablet Take 1,000 mg by mouth at bedtime.   omeprazole (PRILOSEC) 40 MG capsule TAKE 1 CAPSULE BY MOUTH  TWICE DAILY BEFORE A MEAL   predniSONE (DELTASONE) 50 MG tablet Take 1 tablet (50 mg total) by mouth daily with breakfast.   Probiotic Product (PROBIOTIC-10 PO) Take by mouth daily.    sertraline (ZOLOFT) 100 MG  tablet Take 1.5 tablets (150 mg total) by mouth daily.   traMADol (ULTRAM) 50 MG tablet Take 50 mg by mouth every 6 (six) hours as needed for moderate pain.    Turmeric 500 MG TABS Take 500 mg by mouth daily.    No facility-administered encounter medications on file as of 08/09/2021.    Patient Active Problem List   Diagnosis Date Noted   Aortic atherosclerosis ([place]) 11/23/2020   History of colonic polyps    Morton's neuroma of right foot  11/28/2019   Osteoarthritis of elbow 11/28/2019   Bunion 09/09/2019   Osteopenia 06/17/2018   Malignant neoplasm of overlapping sites of left female breast ([place]) 04/10/2018   Liver cyst 02/22/2018   Senile purpura ([place]) 12/27/2017   GAD (generalized anxiety disorder) 06/21/2016   Personal history of tobacco use, presenting hazards to health 02/02/2016   Multiple pulmonary nodules determined by computed tomography of lung 12/21/2015   Benign hypertensive renal disease 06/22/2015   Hyperlipidemia 06/22/2015   Gout 48/27/0786   Lichen sclerosus 75/44/9201   GERD (gastroesophageal reflux disease) 01/22/2013   Peripheral vascular disease ([place]) 01/22/2013   CAD (coronary artery disease) 01/22/2013    Conditions to be addressed/monitored:CAD, HTN, HLD, Anxiety, Depression, and chronic pain  Care Plan : RNCM: Coronary Artery Disease (Adult) and HLD  Updates made by Vanita Ingles, RN since 08/09/2021 12:00 AM     Problem: RNCM: Disease Progression (Coronary Artery Disease) and HLD   Priority: Medium     Long-Range Goal: RNCM: Management of CAD and HLD   Start Date: 12/01/2020  Expected End Date: 12/01/2021  Recent Progress: On track  Priority: Medium  Note:   Current Barriers:  Poorly controlled hyperlipidemia, complicated by HTN, anxiety Current antihyperlipidemic regimen: Lipitor 40 mg QD Most recent lipid panel:     Component Value Date/Time   CHOL 129 04/26/2021 0832   CHOL 129 06/21/2016 0839   TRIG 87 04/26/2021 0832   TRIG 136 06/21/2016 0839   HDL 54 04/26/2021 0832   VLDL 27 06/21/2016 0839   LDLCALC 58 04/26/2021 0832   ASCVD risk enhancing conditions: age >78,  HTN,  former smoker Lacks Scientist, product/process development Does not contact provider office for questions/concerns RN Care Manager Clinical Goal(s):  patient will work with Consulting civil engineer, providers, and care team towards execution of optimized self-health management plan patient will verbalize understanding of plan for  effective management of HLD and CAD patient will work with [facility] and pcp to address needs related to effective management of HLD and CAD patient will attend all scheduled medical appointments: 10-25-2020 at 0820 am  Interventions: Collaboration with Valerie Roys, DO regarding development and update of comprehensive plan of care as evidenced by provider attestation and co-signature Inter-disciplinary care team collaboration (see longitudinal plan of care) Medication review performed; medication list updated in electronic medical record.  Inter-disciplinary care team collaboration (see longitudinal plan of care) Referred to pharmacy team for assistance with HLD and CAD medication management Evaluation of current treatment plan related to HLD and CAD and patient's adherence to plan as established by provider. 08-09-2021: The patient is doing well and denies any issues related to HLD and CAD. Is eating well and has gained weight back. Is compliant with the poc for HLD and CAD. Advised patient to call the office for changes in condition or questions Provided education to patient re: 08-09-2021: Reviewed- heart healthy diet, taking medications as prescribed and working with the CCM team to optimize health and  well being Reviewed scheduled/upcoming provider appointments including: 10-25-2021 at 0820 am Discussed plans with patient for ongoing care management follow up and provided patient with direct contact information for care management team Patient Goals/Self-Care Activities: - call for medicine refill 2 or 3 days before it runs out - call if I am sick and can't take my medicine - keep a list of all the medicines I take; vitamins and herbals too - learn to read medicine labels - use a pillbox to sort medicine - use an alarm clock or phone to remind me to take my medicine - drink 6 to 8 glasses of water each day - eat 3 to 5 servings of fruits and vegetables each day - eat 5 or 6 small meals each  day - fill half the plate with nonstarchy vegetables - limit fast food meals to no more than 1 per week - manage portion size - prepare main meal at home 3 to 5 days each week - read food labels for fat, fiber, carbohydrates and portion size - be open to making changes - I can manage, know and watch for signs of a heart attack - if I have chest pain, call for help - learn about small changes that will make a big difference - learn my personal risk factors - barriers to treatment adherence reviewed and addressed - functional limitation screening reviewed - healthy lifestyle promoted - medication-adherence assessment completed - medication side effects managed - rescue (action) plan developed - response to pharmacologic therapy monitored - self-awareness of signs/symptoms of worsening disease encouraged      Care Plan : RNCM: Hypertension (Adult)  Updates made by Vanita Ingles, RN since 08/09/2021 12:00 AM     Problem: RNCM: Hypertension (Hypertension)   Priority: Medium     Long-Range Goal: RNCM: Hypertension Monitored   Start Date: 12/01/2020  Expected End Date: 12/01/2021  Recent Progress: On track  Priority: Medium  Note:   Objective:  Last practice recorded BP readings:  BP Readings from Last 3 Encounters:  05/06/21 139/79  04/26/21 110/77  04/08/21 (!) 147/76    Most recent eGFR/CrCl: No results found for: EGFR  No components found for: CRCL Current Barriers:  Knowledge Deficits related to basic understanding of hypertension pathophysiology and self care management Knowledge Deficits related to understanding of medications prescribed for management of hypertension Limited Social Support Lacks social connections Does not contact provider office for questions/concerns Case Manager Clinical Goal(s):  patient will verbalize understanding of plan for hypertension management patient will attend all scheduled medical appointments: 10-25-2021 at 0820 am patient will  demonstrate improved adherence to prescribed treatment plan for hypertension as evidenced by taking all medications as prescribed, monitoring and recording blood pressure as directed, adhering to low sodium/DASH diet patient will demonstrate improved health management independence as evidenced by checking blood pressure as directed and notifying PCP if SBP>160 or DBP > 90, taking all medications as prescribe, and adhering to a low sodium diet as discussed. patient will verbalize basic understanding of hypertension disease process and self health management plan as evidenced by compliance with heart healthy diet, compliance with medications regimen and working with the CCM team to optimize health and well being.  Interventions:  Collaboration with Valerie Roys, DO regarding development and update of comprehensive plan of care as evidenced by provider attestation and co-signature Inter-disciplinary care team collaboration (see longitudinal plan of care) Evaluation of current treatment plan related to hypertension self management and patient's adherence to plan as  established by provider. 04-05-2021: The patient denies any issues with HTN. States she has been having good Bp readings. She states she and her husband had covid back in September but it was cold like symptoms and not bad at all. She feels like the vaccines helped a lot with her and her husband only having mild symptoms. Denies any concerns related to post COVID or HTN. Will continue to monitor. 08-09-2021: The patient is doing well and denies any acute distress. The patient states that she is having better blood pressure readings.  Provided education to patient re: stroke prevention, s/s of heart attack and stroke, DASH diet, complications of uncontrolled blood pressure Reviewed medications with patient and discussed importance of compliance. 08-09-2021: Is compliant with medications  Discussed plans with patient for ongoing care management  follow up and provided patient with direct contact information for care management team Advised patient, providing education and rationale, to monitor blood pressure daily and record, calling PCP for findings outside established parameters.  Reviewed scheduled/upcoming provider appointments including: 10-25-2021 Self-Care Activities: - Self administers medications as prescribed Attends all scheduled provider appointments Calls provider office for new concerns, questions, or BP outside discussed parameters Checks BP and records as discussed Follows a low sodium diet/DASH diet Patient Goals: - check blood pressure weekly - choose a place to take my blood pressure (home, clinic or office, retail store) - write blood pressure results in a log or diary - agree on reward when goals are met - agree to work together to make changes - ask questions to understand - have a family meeting to talk about healthy habits - learn about high blood pressure - blood pressure trends reviewed - depression screen reviewed - home or ambulatory blood pressure monitoring encouraged     Care Plan : RNCM: Depression (Adult) and anxiety  Updates made by Vanita Ingles, RN since 08/09/2021 12:00 AM     Problem: RNCM: Depression Identification (Depression) and anxiety   Priority: Medium     Long-Range Goal: RNCM: Depressive Symptoms Identified and anxiety   Start Date: 12/01/2020  Expected End Date: 12/01/2021  Recent Progress: On track  Priority: Medium  Note:   Current Barriers:  Chronic Disease Management support and education needs related to depression and anxiety  Lacks caregiver support.  Lacks social connections Does not contact provider office for questions/concerns  Nurse Case Manager Clinical Goal(s):  patient will verbalize understanding of plan for effective management of depression and anxeity  patient will work with [facility] and pcp  to address needs related to effective management of depression  and anxiety  patient will attend all scheduled medical appointments: 10-25-2021 patient will demonstrate improved adherence to prescribed treatment plan for depression and anxiety  as evidenced bytaking medications as prescribed, regular MD appointments and working with the CCM team to optimize health and well being.   Interventions:  1:1 collaboration with Valerie Roys, DO regarding development and update of comprehensive plan of care as evidenced by provider attestation and co-signature Inter-disciplinary care team collaboration (see longitudinal plan of care) Evaluation of current treatment plan related to depression and anxiety  and patient's adherence to plan as established by provider. 04-05-2021: The patient feels she is in a good place right now with her depression and anxiety. The patient denies any issues with exacerbation. She is working 3 days a week and is thankful her husband is doing so much better. The patient denies any new concerns except for some pain in her foot and  elbow and possible ear infection she is currently dealing with. Will continue to monitor for changes. 06-07-2021: Was gone this am to get her hair done and take her aunt out. She is doing well. Has a 3 day in a row work day coming up and is a little concerned about that but states she will do this and then be off a few days. She tries to work it where she will have days in between working off. Discussed pacing activity. She denies any issues post COVID. Feels that she and her husband are doing well. 08-09-2021: Is doing very well. The patient states that she is getting some shopping done and she is feeling great. Her husband is doing well. She did have a fall last week and hit her left knee but she says other than some pain and bruising she is okay. Will continue to monitor.  Advised patient to call the office for changes in mood/anxiety/ depression Provided education to patient re: effective management of depression and  anxiety including diversion/meditation and faith.  Discussed how the patient has been through a lot in the last couple of years and has overcome obstacles that she thought she would not be able to. Empathetic listening, and support.  Reviewed medications with patient and discussed compliance. The patient states she has not had to take but a couple of her pills since the last [facility] outreach. She says there was a time she needed them all the time but now she only takes when she absolutely has to have them. She is thankful that her life is more balanced now. 08-09-2021: The patient is compliant with medications.  Discussed plans with patient for ongoing care management follow up and provided patient with direct contact information for care management team  Patient Goals/Self-Care Activities  patient will:  - Patient will self administer medications as prescribed Patient will attend all scheduled provider appointments Patient will call pharmacy for medication refills Patient will attend church or other social activities Patient will continue to perform ADL's independently Patient will continue to perform IADL's independently Patient will call provider office for new concerns or questions Patient will work with BSW to address care coordination needs and will continue to work with the clinical team to address health care and disease management related needs.   - anxiety screen reviewed - depression screen reviewed - medication list reviewed        Care Plan : RNCM Chronic Pain (Adult)  Updates made by Vanita Ingles, RN since 08/09/2021 12:00 AM     Problem: RNCM: Chronic Pain Management (Chronic Pain)   Priority: High  Onset Date: 04/05/2021     Long-Range Goal: Chronic Pain Managed   Start Date: 04/05/2021  Expected End Date: 04/05/2022  Recent Progress: On track  Priority: High  Note:   Current Barriers:  Knowledge Deficits related to managing acute/chronic pain Non-adherence to  scheduled provider appointments Non-adherence to prescribed medication regimen Difficulty obtaining medications Chronic Disease Management support and education needs related to chronic pain Unable to independently manage chronic pain to left breast and right foot, sometime elbow pain Nurse Case Manager Clinical Goal(s):  patient will verbalize understanding of plan for managing pain patient will work with [place] to address new concerns related to uncontrolled pain and discomfort  patient will attend all scheduled medical appointments: 10-25-2021 at 0820 am patient will demonstrate use of different relaxation  skills and/or diversional activities to assist with pain reduction (distraction, imagery, relaxation, massage, acupressure, TENS,  heat, and cold application patient will report pain at a level less than 3 to 4 on a 10-10 rating scale patient will use pharmacological and nonpharmacological pain relief strategies patient will verbalize acceptable level of pain relief and ability to engage in desired activities patient will engage in desired activities without an increase in pain level Interventions:  Collaboration with Valerie Roys, DO regarding development and update of comprehensive plan of care as evidenced by provider attestation and co-signature Inter-disciplinary care team collaboration (see longitudinal plan of care) - careful application of heat or ice encouraged - deep breathing, relaxation and mindfulness use promoted - effectiveness of pharmacologic therapy monitored - medication-induced side effects managed - misuse of pain medication assessed - motivation and barriers to change assessed and addressed - mutually acceptable comfort goal set - pain assessed - pain treatment goals reviewed - premedication prior to activity encouraged Evaluation of current treatment plan related to pain in left breast and right foot, sometimes elbow  and patient's adherence to plan  as established by provider. 04-05-2021: The patient states that she is having testing on her left breast and they say it is fat necrosis. The patient will have a mammogram on 04-19-2021 and follow up with the provider on 04-28-2021. She has had an ultrasound recently. She saw podiatrist recently and he states that it is muscle in her right foot that is causing the issue. Discussed heat application today to help with pain to elbow and foot. 06-07-2021: The patient denies any new issues with her pain. The patient states that she was having some right ear pain and it is better now. She thinks it is her sinuses and she noticed that each year it gets worse. She states sometimes she will have a headache over her eye, mainly right eye and that is the side she had the ear infection in. The patient states sometimes she takes mucinex for sinus drainage and has think drainage. Education on monitoring for changes in color and calling if dark brown or green noted. She states she also has nasal spray she uses. Education on discussing with pcp if she needs to take a regular medication like allegra or claratin since she has noticed that this is something that seems to get worse over time. Education and support  given. 08-09-2021: The patient had a fall last week at work and landed on her left knee, the one she had surgery on. The patient states it is bruised pretty bad but other than that she is okay. Denies pain but states at times with movement it hurts some. She did not go to the doctor or call the doctor. Will continue to monitor.  Advised patient to discuss with the pcp the effectiveness of Tramadol she has had in the past and if it is acceptable to have a refill for Tramadol. The patient does take Tylenol, sometimes it helps sometimes it does not.  Provided education to patient re: alternative methods to pain control. The patient has not had good response from topical applications. She has not tried heat or cold but is  considering it. Reviewed medications with patient and discussed compliance and discussing options with the pcp Discussed plans with patient for ongoing care management follow up and provided patient with direct contact information for care management team Allow patient to maintain a diary of pain ratings, timing, precipitating events, medications, treatments, and what works best to relieve pain,  Refer to support groups and self-help groups Educate patient about the use of  pharmacological interventions for pain management- antianxiety, antidepressants, NSAIDS, opioid analgesics,  Explain the importance of lifestyle modifications to effective pain management  Patient Goals/Self Care Activities:  - careful application of heat or ice encouraged - deep breathing, relaxation and mindfulness use promoted - effectiveness of pharmacologic therapy monitored - medication-induced side effects managed - misuse of pain medication assessed - motivation and barriers to change assessed and addressed - mutually acceptable comfort goal set - pain assessed - pain treatment goals reviewed - premedication prior to activity encouraged Self-administers medications as prescribed Attends all scheduled provider appointments Calls pharmacy for medication refills Calls provider office for new concerns or questions     Plan:Telephone follow up appointment with care management team member scheduled for:  10-11-2021 at [place] am  Noreene Larsson RN, MSN, [facility] Mobile: [phone]

## 2021-08-15 ENCOUNTER — Other Ambulatory Visit: Payer: Self-pay | Admitting: Family Medicine

## 2021-08-15 DIAGNOSIS — K219 Gastro-esophageal reflux disease without esophagitis: Secondary | ICD-10-CM

## 2021-08-16 NOTE — Telephone Encounter (Signed)
Requested Prescriptions  Pending Prescriptions Disp Refills   omeprazole (PRILOSEC) 40 MG capsule [Pharmacy Med Name: Omeprazole 40 MG Oral Capsule Delayed Release] 180 capsule 2    Sig: TAKE 1 CAPSULE BY MOUTH  TWICE DAILY BEFORE MEALS     Gastroenterology: Proton Pump Inhibitors Passed - 08/15/2021 10:06 PM      Passed - Valid encounter within last 12 months    Recent Outpatient Visits          3 months ago [place], Megan P, DO   3 months ago Routine general medical examination at a health care facility   [facility], [place] P, DO   4 months ago Hearing loss due to cerumen impaction, right   [place], Megan P, DO   4 months ago Bilateral impacted cerumen   [place], [place], DO   10 months ago Benign hypertensive renal disease   [facility] [place], Barb Merino, DO      Future Appointments            In 2 months Wynetta Emer[place], Barb Merino, DO [place], [place]   In 7 months  [employer], [place]

## 2021-09-22 ENCOUNTER — Telehealth: Payer: Medicare Other

## 2021-09-27 DIAGNOSIS — S76311A Strain of muscle, fascia and tendon of the posterior muscle group at thigh level, right thigh, initial encounter: Secondary | ICD-10-CM | POA: Diagnosis not present

## 2021-09-28 ENCOUNTER — Ambulatory Visit (INDEPENDENT_AMBULATORY_CARE_PROVIDER_SITE_OTHER): Payer: Medicare Other | Admitting: Licensed Clinical Social Worker

## 2021-09-28 DIAGNOSIS — I2510 Atherosclerotic heart disease of native coronary artery without angina pectoris: Secondary | ICD-10-CM

## 2021-09-28 DIAGNOSIS — Z636 Dependent relative needing care at home: Secondary | ICD-10-CM

## 2021-09-28 DIAGNOSIS — F419 Anxiety disorder, unspecified: Secondary | ICD-10-CM

## 2021-09-28 DIAGNOSIS — F411 Generalized anxiety disorder: Secondary | ICD-10-CM

## 2021-09-29 ENCOUNTER — Other Ambulatory Visit: Payer: Self-pay | Admitting: Family Medicine

## 2021-09-29 NOTE — Telephone Encounter (Signed)
Requested Prescriptions  Pending Prescriptions Disp Refills   atenolol (TENORMIN) 50 MG tablet [Pharmacy Med Name: ATENOLOL  50MG   TAB] 90 tablet 1    Sig: TAKE 1 TABLET BY MOUTH  DAILY     Cardiovascular: Beta Blockers 2 Passed - 09/29/2021  5:30 AM      Passed - Cr in normal range and within 360 days    Creatinine  Date Value Ref Range Status  01/20/2013 1.05 0.60 - 1.30 mg/dL Final   Creatinine, Ser  Date Value Ref Range Status  04/26/2021 0.84 0.57 - 1.00 mg/dL Final         Passed - Last BP in normal range    BP Readings from Last 1 Encounters:  05/06/21 139/79         Passed - Last Heart Rate in normal range    Pulse Readings from Last 1 Encounters:  05/06/21 77         Passed - Valid encounter within last 6 months    Recent Outpatient Visits          4 months ago [place], Megan P, DO   5 months ago Routine general medical examination at a health care facility   [facility], [place] P, DO   5 months ago Hearing loss due to cerumen impaction, right   [place], Megan P, DO   5 months ago Bilateral impacted cerumen   [place], Megan P, DO   1 year ago Benign hypertensive renal disease   [facility] [place], Megan P, DO      Future Appointments            In 3 weeks [place], Megan P, DO [place], [facility]   In 6 months  [place], [facility]            hydrALAZINE (APRESOLINE) 25 MG tablet [Pharmacy Med Name: hydrALAZINE HCl 25 MG Oral Tablet] 180 tablet 1    Sig: TAKE 1 TABLET BY MOUTH  TWICE DAILY     Cardiovascular:  Vasodilators Failed - 09/29/2021  5:30 AM      Failed - ANA Screen, Ifa, Serum in normal range and within 360 days    Anti Nuclear Antibody(ANA)  Date Value Ref Range Status  07/24/2017 Negative Negative Final         Passed - HCT in normal range and within 360 days    Hematocrit  Date Value Ref Range  Status  04/26/2021 36.7 34.0 - 46.6 % Final         Passed - HGB in normal range and within 360 days    Hemoglobin  Date Value Ref Range Status  04/26/2021 12.3 11.1 - 15.9 g/dL Final         Passed - RBC in normal range and within 360 days    RBC  Date Value Ref Range Status  04/26/2021 4.16 3.77 - 5.28 x10E6/uL Final  04/15/2018 3.93 3.80 - 5.20 MIL/uL Final         Passed - WBC in normal range and within 360 days    WBC  Date Value Ref Range Status  04/26/2021 4.8 3.4 - 10.8 x10E3/uL Final  04/15/2018 4.8 3.6 - 11.0 K/uL Final         Passed - PLT in normal range and within 360 days    Platelets  Date Value Ref Range Status  04/26/2021 225 150 -  450 x10E3/uL Final         Passed - Last BP in normal range    BP Readings from Last 1 Encounters:  05/06/21 139/79         Passed - Valid encounter within last 12 months    Recent Outpatient Visits          4 months ago [place], Megan P, DO   5 months ago Routine general medical examination at a health care facility   [facility], [place] P, DO   5 months ago Hearing loss due to cerumen impaction, right   [place], Megan P, DO   5 months ago Bilateral impacted cerumen   [place], [place], DO   1 year ago Benign hypertensive renal disease   [facility] [place], Megan P, DO      Future Appointments            In 3 weeks Johnson, Megan P, DO [place], [place]   In 6 months  [place], [facility]            benazepril (LOTENSIN) 40 MG tablet [Pharmacy Med Name: Benazepril HCl 40 MG Oral Tablet] 90 tablet 1    Sig: TAKE 1 TABLET BY MOUTH  DAILY     Cardiovascular:  ACE Inhibitors Passed - 09/29/2021  5:30 AM      Passed - Cr in normal range and within 180 days    Creatinine  Date Value Ref Range Status  01/20/2013 1.05 0.60 - 1.30 mg/dL Final   Creatinine, Ser  Date Value  Ref Range Status  04/26/2021 0.84 0.57 - 1.00 mg/dL Final         Passed - K in normal range and within 180 days    Potassium  Date Value Ref Range Status  04/26/2021 3.9 3.5 - 5.2 mmol/L Final  01/20/2013 4.0 3.5 - 5.1 mmol/L Final         Passed - Patient is not pregnant      Passed - Last BP in normal range    BP Readings from Last 1 Encounters:  05/06/21 139/79         Passed - Valid encounter within last 6 months    Recent Outpatient Visits          4 months ago [place], Megan P, DO   5 months ago Routine general medical examination at a health care facility   [facility], [place] P, DO   5 months ago Hearing loss due to cerumen impaction, right   [place], [place], DO   5 months ago Bilateral impacted cerumen   [place], [place], DO   1 year ago Benign hypertensive renal disease   [facility] [place], Megan P, DO      Future Appointments            In 3 weeks Johnson, Megan P, DO [place], [facility]   In 6 months  [place], [facility]            amLODipine (NORVASC) 10 MG tablet [Pharmacy Med Name: amLODIPine Besylate 10 MG Oral Tablet] 90 tablet 1    Sig: TAKE 1 TABLET BY MOUTH  DAILY     Cardiovascular: Calcium Channel Blockers 2 Passed - 09/29/2021  5:30 AM  Passed - Last BP in normal range    BP Readings from Last 1 Encounters:  05/06/21 139/79         Passed - Last Heart Rate in normal range    Pulse Readings from Last 1 Encounters:  05/06/21 77         Passed - Valid encounter within last 6 months    Recent Outpatient Visits          4 months ago [place], Megan P, DO   5 months ago Routine general medical examination at a health care facility   [facility], [place] P, DO   5 months ago Hearing loss due to cerumen impaction, right   [place], [place], DO   5 months ago Bilateral impacted cerumen   [place], [place], DO   1 year ago Benign hypertensive renal disease   [facility] Valerie Roys, DO      Future Appointments            In 3 weeks Wynetta Emery, Barb Merino, DO [place], [place]   In 6 months  [employer], [place]

## 2021-10-02 ENCOUNTER — Encounter: Payer: Self-pay | Admitting: Family Medicine

## 2021-10-03 ENCOUNTER — Ambulatory Visit (INDEPENDENT_AMBULATORY_CARE_PROVIDER_SITE_OTHER): Payer: Medicare Other | Admitting: Nurse Practitioner

## 2021-10-03 ENCOUNTER — Encounter: Payer: Self-pay | Admitting: Nurse Practitioner

## 2021-10-03 ENCOUNTER — Other Ambulatory Visit: Payer: Self-pay

## 2021-10-03 ENCOUNTER — Ambulatory Visit: Payer: Self-pay | Admitting: *Deleted

## 2021-10-03 DIAGNOSIS — S76319A Strain of muscle, fascia and tendon of the posterior muscle group at thigh level, unspecified thigh, initial encounter: Secondary | ICD-10-CM

## 2021-10-03 MED ORDER — TIZANIDINE HCL 2 MG PO TABS: 2.0000 mg | ORAL_TABLET | Freq: Four times a day (QID) | ORAL | 0 refills | Status: DC | PRN

## 2021-10-03 MED ORDER — HYDROCODONE-ACETAMINOPHEN 5-325 MG PO TABS: 1.0000 | ORAL_TABLET | Freq: Four times a day (QID) | ORAL | 0 refills | Status: DC | PRN

## 2021-10-03 MED ORDER — LIDOCAINE 5 % EX PTCH: 1.0000 | MEDICATED_PATCH | CUTANEOUS | 0 refills | Status: DC

## 2021-10-03 NOTE — Patient Instructions (Signed)
Hamstring Strain A hamstring strain happens when the muscles in the back of the thighs (hamstring muscles) are overstretched or torn. The hamstring muscles are used in straightening the hips, bending the knees, and pulling back the legs. This injury is often called a pulled hamstring muscle. The tissue that connects the muscle to a bone (tendon) may also be affected. The severity of a hamstring strain may be rated in degrees or grades. First-degree (or grade 1) strains have the least amount of muscle tearing and pain. Second-degree and third-degree (grade 2 and 3) strains have increasingly more tearing and pain. What are the causes? This condition is caused by a sudden, violent force being placed on the hamstring muscles, stretching them too far. This often happens during activities that involve sprinting, jumping, kicking, or weight lifting. What increases the risk? Hamstring strains are especially common in athletes. The following factors may also make you more likely to develop this condition: Having low strength, endurance, or flexibility of the hamstring muscles. Doing high-impact physical activity or sports. Having poor physical fitness. Having a previous leg injury. Having tired (fatigued) muscles. Having a previous hamstring strain. What are the signs or symptoms? Symptoms of this condition include: Pain in the back of the thigh or buttocks. Swelling. Bruising. Muscle spasms. Trouble moving the affected muscle because of pain. For severe strains, you may feel popping or snapping in the back of your thigh when the injury occurs. How is this diagnosed? This condition is diagnosed based on your symptoms, your medical history, and a physical exam. You may also have imaging tests, such as an MRI or X-rays. Your strain may be rated based on how severe it is. The ratings are: Grade 1 strain (mild). Muscles are overstretched. There may be very small muscle tears. Grade 2 strain (moderate).  Muscles are partially torn. Grade 3 strain (severe). Muscles are completely torn. How is this treated? Treatment for this condition usually involves: Protecting, resting, icing, applying compression, and elevating the injured area (PRICE therapy). Medicines. Your health care provider may recommend medicines to help reduce pain or inflammation. Doing exercises to regain strength and flexibility in the muscles. Your health care provider will tell you when it is okay to begin exercising. Hamstring strains may take a long time to heal. This type of strain may happen again in athletes. Follow your health care provider's advice about when to return to sports-related activities. Follow these instructions at home: PRICE therapy Use PRICE therapy to promote muscle healing during the first 2-3 days after your injury, or as told by your health care provider. Protect the muscle from being injured again. Rest your injury. This usually involves limiting your normal activities and not using the injured hamstring muscle. Talk with your health care provider about how you should limit your activities. Apply ice to the injured area: Put ice in a plastic bag. Place a towel between your skin and the bag. Leave the ice on for 20 minutes, 2-3 times a day. After the third day, switch to applying heat as told. Put pressure (compression) on your injured hamstring by wrapping it with an elastic bandage. Be careful not to wrap it too tightly. That may interfere with blood circulation or may increase swelling. Raise (elevate) your injured hamstring above the level of your heart as often as possible. When you are lying down, you can do this by putting a pillow under your thigh.  Activity Begin exercising or stretching only as told by your health care provider.  Do not return to full activity level until your health care provider approves. To help prevent muscle strains in the future, always warm up before exercising and  stretch afterward. General instructions Take over-the-counter and prescription medicines only as told by your health care provider. If directed, apply heat to the affected area as often as told by your health care provider. Use the heat source that your health care provider recommends, such as a moist heat pack or a heating pad. Place a towel between your skin and the heat source. Leave the heat on for 20-30 minutes. Remove the heat if your skin turns bright red. This is especially important if you are unable to feel pain, heat, or cold. You may have a greater risk of getting burned. Keep all follow-up visits. This is important. Contact a health care provider if: You have increasing pain or swelling in the injured area. You have numbness, tingling, or a significant loss of strength in the injured area. Get help right away if: Your foot or your toes become cold or turn blue. Summary A hamstring strain happens when the muscles in the back of the thighs (hamstring muscles) are overstretched or torn. This injury can be caused by a sudden, violent force being placed on the hamstring muscles, causing them to stretch too far. Symptoms include pain, swelling, and muscle spasms in the injured area. Treatment includes PRICE therapy: protecting, resting, icing, applying compression, and elevating the injured area. This information is not intended to replace advice given to you by your health care provider. Make sure you discuss any questions you have with your health care provider. Document Revised: 01/13/2021 Document Reviewed: 01/13/2021 Elsevier Patient Education  [employer].

## 2021-10-03 NOTE — Chronic Care Management (AMB) (Signed)
Chronic Care Management    Clinical Social Work Note  10/03/2021 Name: Marie Parker MRN: [id number] DOB: [birth year]  Marie Parker is a 76 y.o. year old female who is a primary care patient of Valerie Roys, DO. The CCM team was consulted to assist the patient with chronic disease management and/or care coordination needs related to: Mental Health Counseling and Resources and Caregiver Stress.   Engaged with patient by telephone for follow up visit in response to provider referral for social work chronic care management and care coordination services.   Consent to Services:  The patient was given information about Chronic Care Management services, agreed to services, and gave verbal consent prior to initiation of services.  Please see initial visit note for detailed documentation.   Patient agreed to services and consent obtained.   Summary:  Patient continues to maintain positive progress with care plan goals. Self-care strategies identified to manage caregiver stress, while healing from pulled muscle after falling 09/22/21. Receives strong support from family. See Care Plan below for interventions and patient self-care activities.  Recommendation: Patient may benefit from, and is in agreement work with LCSW to address care coordination needs and will continue to work with the clinical team to address health care and disease management related needs.   Follow up Plan: Patient would like continued follow-up from CCM LCSW.  per patient's request will follow up in 01/25/22.  Will call office if needed prior to next encounter.    SDOH (Social Determinants of Health) assessments and interventions performed:    Advanced Directives Status: Not addressed in this encounter.  CCM Care Plan  Allergies  Allergen Reactions   Alendronate Nausea Only and Other (See Comments)    Other reaction(s): NAUSEA GI upset   Amoxicillin-Pot Clavulanate Diarrhea   Crestor [Rosuvastatin] Nausea Only    Cyclobenzaprine Other (See Comments)    Vertigo, couldn't focus eyes   Erythromycin Other (See Comments)    GI upset    Fosamax [Alendronate Sodium] Nausea And Vomiting    Outpatient Encounter Medications as of 09/28/2021  Medication Sig   acetaminophen (TYLENOL) 650 MG CR tablet Take 1,300 mg by mouth daily as needed for pain.    amitriptyline (ELAVIL) 25 MG tablet Take 1 tablet (25 mg total) by mouth at bedtime.   aspirin 81 MG tablet Take 1 tablet (81 mg total) by mouth daily. (Patient taking differently: Take 81 mg by mouth at bedtime.)   atorvastatin (LIPITOR) 40 MG tablet Take 1 tablet (40 mg total) by mouth at bedtime.   benzonatate (TESSALON) 200 MG capsule Take 1 capsule (200 mg total) by mouth 2 (two) times daily as needed for cough.   CALCIUM-VITAMIN D PO Take 2 tablets by mouth daily.   chlorpheniramine-HYDROcodone (TUSSIONEX PENNKINETIC ER) 10-8 MG/5ML SUER Take 5 mLs by mouth every 12 (twelve) hours as needed.   clonazePAM (KLONOPIN) 0.5 MG tablet Take 0.5-1 tablets (0.25-0.5 mg total) by mouth 2 (two) times daily as needed for anxiety.   fluticasone (FLONASE) 50 MCG/ACT nasal spray Place 2 sprays into both nostrils daily.   letrozole (FEMARA) 2.5 MG tablet TAKE 1 TABLET(2.5 MG) BY MOUTH DAILY (Patient taking differently: Take 2.5 mg by mouth daily.)   Multiple Vitamin (MULTIVITAMIN WITH MINERALS) TABS tablet Take 1 tablet by mouth daily.   niacin 500 MG tablet Take 1,000 mg by mouth at bedtime.   omeprazole (PRILOSEC) 40 MG capsule TAKE 1 CAPSULE BY MOUTH  TWICE DAILY BEFORE MEALS   predniSONE (DELTASONE) 50  MG tablet Take 1 tablet (50 mg total) by mouth daily with breakfast.   Probiotic Product (PROBIOTIC-10 PO) Take by mouth daily.    sertraline (ZOLOFT) 100 MG tablet Take 1.5 tablets (150 mg total) by mouth daily.   traMADol (ULTRAM) 50 MG tablet Take 50 mg by mouth every 6 (six) hours as needed for moderate pain.    Turmeric 500 MG TABS Take 500 mg by mouth daily.     [DISCONTINUED] amLODipine (NORVASC) 10 MG tablet Take 1 tablet (10 mg total) by mouth daily.   [DISCONTINUED] atenolol (TENORMIN) 50 MG tablet Take 1 tablet (50 mg total) by mouth daily.   [DISCONTINUED] benazepril (LOTENSIN) 40 MG tablet Take 1 tablet (40 mg total) by mouth daily.   [DISCONTINUED] hydrALAZINE (APRESOLINE) 25 MG tablet Take 1 tablet (25 mg total) by mouth 2 (two) times daily.   No facility-administered encounter medications on file as of 09/28/2021.    Patient Active Problem List   Diagnosis Date Noted   Aortic atherosclerosis ([place]) 11/23/2020   History of colonic polyps    Morton's neuroma of right foot 11/28/2019   Osteoarthritis of elbow 11/28/2019   Bunion 09/09/2019   Osteopenia 06/17/2018   Malignant neoplasm of overlapping sites of left female breast ([place]) 04/10/2018   Liver cyst 02/22/2018   Senile purpura ([place]) 12/27/2017   GAD (generalized anxiety disorder) 06/21/2016   Personal history of tobacco use, presenting hazards to health 02/02/2016   Multiple pulmonary nodules determined by computed tomography of lung 12/21/2015   Benign hypertensive renal disease 06/22/2015   Hyperlipidemia 06/22/2015   Gout 66/01/3015   Lichen sclerosus 09/05/3233   GERD (gastroesophageal reflux disease) 01/22/2013   Peripheral vascular disease ([place]) 01/22/2013   CAD (coronary artery disease) 01/22/2013    Conditions to be addressed/monitored: CAD, Anxiety, and Osteoarthritis;  Caregiver Stress  Care Plan : General Social Work (Adult)  Updates made by Rebekah [place], LCSW since 10/03/2021 12:00 AM     Problem: SW- I want to continue taking good care of myself   Priority: Medium  Onset Date: 07/26/2020     Long-Range Goal: Anxiety Symptoms Monitored and Managed   Start Date: 10/21/2020  This Visit's Progress: On track  Recent Progress: On track  Priority: Medium  Note:   Timeframe:  Long-Range Goal Priority:  Medium  Start Date:  10/21/20                          Expected End Date:  01/25/22                   Follow Up Date- 01/25/22  Current Barriers:  Financial constraints Limited social support Level of care concerns Mental Health Concerns  Social Isolation Limited access to caregiver- in terms of spouse Lacks knowledge of community resource: available personal care service resources and [facility] resource connection within the area Clinical Social Work Clinical Goal(s):  Over the next 90 days, client will work with SW to address concerns related to reducing anxiety and improving self-care Interventions: Patient interviewed and appropriate assessments performed Patient reports she been very well She recently had cataracts surgery in both eyes stating, it was "the best thing I ever done. I can see so much better Patient currently uses reading glasses while at work part-time Patient reported that her anxiety was triggered earlier in the year when spouse was very sick. She often felt uncomfortable preparing his heart medications; however, his medications were adjusted  to an inhaler, which has worked effectively for spouse. Patient shared that his health has since improved 10/28: Patient reports, ""I've done very well" regarding management of anxiety. Pt contributes this to compliance with med management, stating "It has helped tremendously" Pt's spouse is doing well and is strong enough to participate in radiation to tx prostrate cancer 2/1: Patient continues to report decrease in anxiety symptoms. She will utilize medication occasionally to assist with management of symptoms Caregiver stress was acknowledged. CCM LCSW provided validation and encouragement Patient received flu shot yesterday and was recently administered cortisone shot to assist with pain management in foot. Patient is intentional about not overly exerting self and utilizes OTC medicine for pain, if needed 2/1: Patient fell Thursday, 01/26 and pulled her hamstring. She was assessed by  orthopedic and she did not have any injuries; however, endorses continued pain. Ortho encouraged her that it may take 3 weeks for her pain to go away. Patient is currently utilizing heating pad, ice, and Tylenol Patient continues to receive strong support from family LCSW discussed self-care strategies Depression screen reviewed  Mindfulness or Relaxation training provided Active listening / Reflection utilized  Emotional Support Provided Verbalization of feelings encouraged  LCSW provided reflective listening and implemented appropriate interventions to help suppport patient and her emotional needs  Discussed plans with patient for ongoing care management follow up and provided patient with direct contact information for care management team Patient Self Care Activities/Goals:  Attend all scheduled provider appointments Call provider office for new concerns or questions Continue compliance with medication management Continue utilizing healthy coping skills       Christa See, MSW, [email or web address] Phone [phone] 6:27 AM

## 2021-10-03 NOTE — Assessment & Plan Note (Signed)
Acute starting on 09/22/21 after a fall, saw ortho after incident and reports normal imaging -- unable to view in Epic, but see note with diagnosis of hamstring tear.  She has not followed up with ortho as instructed.  At this time recommend use of hamstring support sleeve, showed her examples.  Will send in Tizanidine and Norco (5 day supply only) to use as needed for pain.  PDMP reviewed.  Educated her on medications and use.  Have also sent in Lidocaine patches to apply as needed for hamstring area for comfort.  Discussed ongoing need to wear support to area and apply ice as needed.  Follow-up at end of month with PCP.

## 2021-10-03 NOTE — Telephone Encounter (Signed)
appt

## 2021-10-03 NOTE — Patient Instructions (Signed)
Visit Information  Thank you for taking time to visit with me today. Please don't hesitate to contact me if I can be of assistance to you before our next scheduled telephone appointment.  Following are the goals we discussed today:  Patient Self Care Activities/Goals:  Attend all scheduled provider appointments Call provider office for new concerns or questions Continue compliance with medication management Continue utilizing healthy coping skills  Our next appointment is by telephone on 5/10/26/21 at 10:00 AM  Please call the care guide team at [phone] if you need to cancel or reschedule your appointment.   If you are experiencing a Mental Health or [place] or need someone to talk to, please call the [place] National Suicide Prevention Lifeline: 364 758 1587 or TTY: 754-073-9411 TTY [phone]) to talk to a trained counselor call 911   Patient verbalizes understanding of instructions and care plan provided today and agrees to view in [place]. Active MyChart status confirmed with patient.    Christa See, MSW, [email or web address] Phone [phone] 6:27 AM
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
## 2021-10-04 NOTE — Telephone Encounter (Signed)
Pt states she saw Jolene for this yesterday.

## 2021-10-07 ENCOUNTER — Other Ambulatory Visit: Payer: Self-pay | Admitting: Nurse Practitioner

## 2021-10-07 ENCOUNTER — Encounter: Payer: Self-pay | Admitting: *Deleted

## 2021-10-07 NOTE — Telephone Encounter (Signed)
Requested medications are due for refill today.  unsure  Requested medications are on the active medications list.  yes  Last refill. 10/03/2021  Future visit scheduled.   yes  Notes to clinic.  Medication not delegated.    Requested Prescriptions  Pending Prescriptions Disp Refills   tiZANidine (ZANAFLEX) 2 MG tablet [Pharmacy Med Name: TIZANIDINE 2MG  TABLETS] 30 tablet 0    Sig: TAKE 1 TABLET(2 MG) BY MOUTH EVERY 6 HOURS AS NEEDED FOR MUSCLE SPASMS     Not Delegated - Cardiovascular:  Alpha-2 Agonists - tizanidine Failed - 10/07/2021  3:30 AM      Failed - This refill cannot be delegated      Passed - Valid encounter within last 6 months    Recent Outpatient Visits           4 days ago Hamstring tear   [place], Henrine Screws T, NP   5 months ago COVID-19   [place], DO   5 months ago Routine general medical examination at a health care facility   [facility], [place], DO   6 months ago Hearing loss due to cerumen impaction, right   [place], [place], DO   6 months ago Bilateral impacted cerumen   [place], Barb Merino, DO       Future Appointments             In 2 weeks Wynetta Emery, Barb Merino, DO [place], [place]   In 6 months  [employer], [place]

## 2021-10-11 ENCOUNTER — Ambulatory Visit: Payer: Self-pay

## 2021-10-11 ENCOUNTER — Telehealth: Payer: Medicare Other

## 2021-10-11 DIAGNOSIS — F419 Anxiety disorder, unspecified: Secondary | ICD-10-CM

## 2021-10-11 DIAGNOSIS — I2510 Atherosclerotic heart disease of native coronary artery without angina pectoris: Secondary | ICD-10-CM

## 2021-10-11 DIAGNOSIS — F411 Generalized anxiety disorder: Secondary | ICD-10-CM

## 2021-10-11 DIAGNOSIS — S76319A Strain of muscle, fascia and tendon of the posterior muscle group at thigh level, unspecified thigh, initial encounter: Secondary | ICD-10-CM

## 2021-10-11 DIAGNOSIS — E782 Mixed hyperlipidemia: Secondary | ICD-10-CM

## 2021-10-11 DIAGNOSIS — I10 Essential (primary) hypertension: Secondary | ICD-10-CM

## 2021-10-11 DIAGNOSIS — F339 Major depressive disorder, recurrent, unspecified: Secondary | ICD-10-CM

## 2021-10-11 NOTE — Patient Instructions (Signed)
Visit Information  Thank you for taking time to visit with me today. Please don't hesitate to contact me if I can be of assistance to you before our next scheduled telephone appointment.  Following are the goals we discussed today:  RNCM Clinical Goal(s):  Patient will verbalize basic understanding of CAD, HTN, HLD, Anxiety, Depression, and Chronic pain  disease process and self health management plan as evidenced by keeping appointments, calling the office for questions or concerns, following the plan of care for chronic conditions management, and working with the CCM team to optimize health and well being.  take all medications exactly as prescribed and will call provider for medication related questions as evidenced by compliance with medications and calling for refills before running out of medications    attend all scheduled medical appointments: 10-25-2021  as evidenced by keeping appointments and calling for schedule change needs         demonstrate improved and ongoing adherence to prescribed treatment plan for CAD, HTN, HLD, Anxiety, Depression, and Chronic pain  as evidenced by stable VS, pain decreasing, no acute exacerbations of anxiety or depression, effective management of chronic conditions  demonstrate ongoing self health care management ability for effective management of chronic conditions as evidenced by working with the CCM team  through collaboration with Consulting civil engineer, provider, and care team.    Interventions: 1:1 collaboration with primary care provider regarding development and update of comprehensive plan of care as evidenced by provider attestation and co-signature Inter-disciplinary care team collaboration (see longitudinal plan of care) Evaluation of current treatment plan related to  self management and patient's adherence to plan as established by provider     CAD  (Status: Goal on Track (progressing): YES.) Long Term Goal     BP Readings from Last 3 Encounters:   10/03/21 123/75  05/06/21 139/79  04/26/21 110/77         Lab Results  Component Value Date    CHOL 129 04/26/2021    HDL 54 04/26/2021    [place] 58 04/26/2021    TRIG 87 04/26/2021    Assessed understanding of CAD diagnosis Medications reviewed including medications utilized in CAD treatment plan Provided education on importance of blood pressure control in management of CAD; Provided education on Importance of limiting foods high in cholesterol; Counseled on importance of regular laboratory monitoring as prescribed. 10-11-2021: Has regular lab testing. Last in August of 2022. Reviewed Importance of taking all medications as prescribed Reviewed Importance of attending all scheduled provider appointments Advised to report any changes in symptoms or exercise tolerance Advised patient to discuss changes in CAD and heart health with provider; Screening for signs and symptoms of depression related to chronic disease state;  Assessed social determinant of health barriers;    Anxiety and Depression   (Status: Goal on Track (progressing): YES.) Long Term Goal  Evaluation of current treatment plan related to Anxiety and Depression, Mental Health Concerns  self-management and patient's adherence to plan as established by provider. Discussed plans with patient for ongoing care management follow up and provided patient with direct contact information for care management team Advised patient to call the office for changes in mood, anxiety, depression, or new mental health concerns; Provided education to patient re: effective management of anxiety and depression. The patient sometimes gets very anxious about her health and that of her husband. She reaches out to the pcp when she is having exacerbations of her anxiety and depression. The patient has good control of  her depression and anxiety at this time; Reviewed medications with patient and discussed compliance- the patient is compliant with  medications ; Reviewed scheduled/upcoming provider appointments including 10-25-2021 ; Discussed plans with patient for ongoing care management follow up and provided patient with direct contact information for care management team; Advised patient to discuss new questions or concerns about her anxiety, depression and mental health with provider;   Hyperlipidemia:  (Status: Goal on Track (progressing): YES.) Long Term Goal       Lab Results  Component Value Date    CHOL 129 04/26/2021    HDL 54 04/26/2021    [place] 58 04/26/2021    TRIG 87 04/26/2021      Medication review performed; medication list updated in electronic medical record.  Provider established cholesterol goals reviewed; Counseled on importance of regular laboratory monitoring as prescribed; Provided HLD educational materials; Reviewed role and benefits of statin for ASCVD risk reduction; Discussed strategies to manage statin-induced myalgias; Reviewed importance of limiting foods high in cholesterol;   Hypertension: (Status: Goal on Track (progressing): YES.) Long Term Goal  Last practice recorded BP readings:     BP Readings from Last 3 Encounters:  10/03/21 123/75  05/06/21 139/79  04/26/21 110/77  Most recent eGFR/CrCl:       Lab Results  Component Value Date    EGFR 73 04/26/2021    No components found for: CRCL   Evaluation of current treatment plan related to hypertension self management and patient's adherence to plan as established by provider;   Provided education to patient re: stroke prevention, s/s of heart attack and stroke; Reviewed prescribed diet heart healthy Reviewed medications with patient and discussed importance of compliance;  Discussed plans with patient for ongoing care management follow up and provided patient with direct contact information for care management team; Advised patient, providing education and rationale, to monitor blood pressure daily and record, calling PCP for  findings outside established parameters;  Advised patient to discuss HTN and blood pressure changes  with provider; Provided education on prescribed diet heart healthy;  Discussed complications of poorly controlled blood pressure such as heart disease, stroke, circulatory complications, vision complications, kidney impairment, sexual dysfunction;    Pain:  (Status: Goal on Track (progressing): YES.) Long Term Goal  Pain assessment performed. 10-11-2021: The patient has pain in her right leg after a fall x 3 weeks ago. She has a pulled hamstring. The patient states that she is doing better and it is more sore than anything. She said at first she could not even walk. Education and support given. She will follow up with the pcp on 10-25-2021 Medications reviewed. 10-11-2021: Is compliant with medications Reviewed provider established plan for pain management; Discussed importance of adherence to all scheduled medical appointments; Counseled on the importance of reporting any/all new or changed pain symptoms or management strategies to pain management provider; Advised patient to report to care team affect of pain on daily activities; Discussed use of relaxation techniques and/or diversional activities to assist with pain reduction (distraction, imagery, relaxation, massage, acupressure, TENS, heat, and cold application; Reviewed with patient prescribed pharmacological and nonpharmacological pain relief strategies; Advised patient to discuss unresolved pain, changes in level or intensity of pain with provider;   Patient Goals/Self-Care Activities: Take medications as prescribed   Attend all scheduled provider appointments Call pharmacy for medication refills 3-7 days in advance of running out of medications Attend church or other social activities Perform all self care activities independently  Perform IADL's (shopping,  preparing meals, housekeeping, managing finances) independently Call provider  office for new concerns or questions  Work with the social worker to address care coordination needs and will continue to work with the clinical team to address health care and disease management related needs call the Suicide and Crisis Lifeline: 988 call the [place] National Suicide Prevention Lifeline: (703)104-5681 or TTY: (639)079-6012 TTY (418)398-2136) to talk to a trained counselor call 1-800-273-TALK (toll free, 24 hour hotline) if experiencing a Mental Health or [place]  check blood pressure weekly choose a place to take my blood pressure (home, clinic or office, retail store) write blood pressure results in a log or diary learn about high blood pressure keep a blood pressure log take blood pressure log to all doctor appointments call doctor for signs and symptoms of high blood pressure develop an action plan for high blood pressure keep all doctor appointments take medications for blood pressure exactly as prescribed report new symptoms to your doctor eat more whole grains, fruits and vegetables, lean meats and healthy fats - call for medicine refill 2 or 3 days before it runs out - take all medications exactly as prescribed - call doctor with any symptoms you believe are related to your medicine - call doctor when you experience any new symptoms - go to all doctor appointments as scheduled - adhere to prescribed diet: heart healthy      Our next appointment is by telephone on 12-20-2021 at 0945 am  Please call the care guide team at [phone] if you need to cancel or reschedule your appointment.   If you are experiencing a Mental Health or [place] or need someone to talk to, please call the Suicide and Crisis Lifeline: 988 call the [place] National Suicide Prevention Lifeline: 567-131-5569 or TTY: 606 279 6083 TTY 919-285-3361) to talk to a trained counselor call 1-800-273-TALK (toll free, 24 hour hotline)   Patient verbalizes  understanding of instructions and care plan provided today and agrees to view in [place]. Active MyChart status confirmed with patient.    Noreene Larsson RN, MSN, [facility] Mobile: [phone]

## 2021-10-11 NOTE — Chronic Care Management (AMB) (Signed)
Chronic Care Management   CCM RN Visit Note  10/11/2021 Name: Marie Parker MRN: [id number] DOB: 06-Apr-1946  Subjective: Marie Parker is a 76 y.o. year old female who is a primary care patient of Valerie Roys, DO. The care management team was consulted for assistance with disease management and care coordination needs.    Engaged with patient by telephone for follow up visit in response to provider referral for case management and/or care coordination services.   Consent to Services:  The patient was given information about Chronic Care Management services, agreed to services, and gave verbal consent prior to initiation of services.  Please see initial visit note for detailed documentation.   Patient agreed to services and verbal consent obtained.   Assessment: Review of patient past medical history, allergies, medications, health status, including review of consultants reports, laboratory and other test data, was performed as part of comprehensive evaluation and provision of chronic care management services.   SDOH (Social Determinants of Health) assessments and interventions performed:    CCM Care Plan  Allergies  Allergen Reactions   Alendronate Nausea Only and Other (See Comments)    Other reaction(s): NAUSEA GI upset   Amoxicillin-Pot Clavulanate Diarrhea   Crestor [Rosuvastatin] Nausea Only   Cyclobenzaprine Other (See Comments)    Vertigo, couldn't focus eyes   Erythromycin Other (See Comments)    GI upset    Fosamax [Alendronate Sodium] Nausea And Vomiting    Outpatient Encounter Medications as of 10/11/2021  Medication Sig   acetaminophen (TYLENOL) 650 MG CR tablet Take 1,300 mg by mouth daily as needed for pain.    amitriptyline (ELAVIL) 25 MG tablet Take 1 tablet (25 mg total) by mouth at bedtime.   amLODipine (NORVASC) 10 MG tablet TAKE 1 TABLET BY MOUTH  DAILY   aspirin 81 MG tablet Take 1 tablet (81 mg total) by mouth daily. (Patient taking  differently: Take 81 mg by mouth at bedtime.)   atenolol (TENORMIN) 50 MG tablet TAKE 1 TABLET BY MOUTH  DAILY   atorvastatin (LIPITOR) 40 MG tablet Take 1 tablet (40 mg total) by mouth at bedtime.   benazepril (LOTENSIN) 40 MG tablet TAKE 1 TABLET BY MOUTH  DAILY   CALCIUM-VITAMIN D PO Take 2 tablets by mouth daily.   clonazePAM (KLONOPIN) 0.5 MG tablet Take 0.5-1 tablets (0.25-0.5 mg total) by mouth 2 (two) times daily as needed for anxiety.   fluticasone (FLONASE) 50 MCG/ACT nasal spray Place 2 sprays into both nostrils daily.   hydrALAZINE (APRESOLINE) 25 MG tablet TAKE 1 TABLET BY MOUTH  TWICE DAILY   HYDROcodone-acetaminophen (NORCO) 5-325 MG tablet Take 1 tablet by mouth every 6 (six) hours as needed for moderate pain.   letrozole (FEMARA) 2.5 MG tablet TAKE 1 TABLET(2.5 MG) BY MOUTH DAILY (Patient taking differently: Take 2.5 mg by mouth daily.)   lidocaine (LIDODERM) 5 % Place 1 patch onto the skin daily. Remove & Discard patch within 12 hours or as directed by MD   Multiple Vitamin (MULTIVITAMIN WITH MINERALS) TABS tablet Take 1 tablet by mouth daily.   niacin 500 MG tablet Take 1,000 mg by mouth at bedtime.   omeprazole (PRILOSEC) 40 MG capsule TAKE 1 CAPSULE BY MOUTH  TWICE DAILY BEFORE MEALS   Probiotic Product (PROBIOTIC-10 PO) Take by mouth daily.    sertraline (ZOLOFT) 100 MG tablet Take 1.5 tablets (150 mg total) by mouth daily.   tiZANidine (ZANAFLEX) 2 MG tablet TAKE 1 TABLET(2 MG) BY MOUTH EVERY 6  HOURS AS NEEDED FOR MUSCLE SPASMS   Turmeric 500 MG TABS Take 500 mg by mouth daily.    No facility-administered encounter medications on file as of 10/11/2021.    Patient Active Problem List   Diagnosis Date Noted   Hamstring tear 10/03/2021   Aortic atherosclerosis ([place]) 11/23/2020   History of colonic polyps    Osteoarthritis of elbow 11/28/2019   Bunion 09/09/2019   Osteopenia 06/17/2018   Malignant neoplasm of overlapping sites of left female breast ([place]) 04/10/2018    Liver cyst 02/22/2018   Senile purpura ([place]) 12/27/2017   GAD (generalized anxiety disorder) 06/21/2016   Personal history of tobacco use, presenting hazards to health 02/02/2016   Multiple pulmonary nodules determined by computed tomography of lung 12/21/2015   Benign hypertensive renal disease 06/22/2015   Hyperlipidemia 06/22/2015   Gout 38/75/6433   Lichen sclerosus 29/51/8841   GERD (gastroesophageal reflux disease) 01/22/2013   Peripheral vascular disease ([place]) 01/22/2013   CAD (coronary artery disease) 01/22/2013    Conditions to be addressed/monitored:CAD, HTN, HLD, Anxiety, Depression, and Chronic Pain  Care Plan : RNCM: General Plan of Care (Adult) for Chronic Disease Management and Care Coordination Needs  Updates made by Vanita Ingles, RN since 10/11/2021 12:00 AM     Problem: RNCM: Development of plan of care for chronic disease management (CAD, HTN, HLD, Depression, Anxiety, and Chronic pain)   Priority: High     Long-Range Goal: Effective Management of plan of care for chronic disease management (CAD, HTN, HLD, Depression, Anxiety, and Chronic pain)   Start Date: 10/11/2021  Expected End Date: 10/11/2022  Priority: High  Note:   Current Barriers:  Knowledge Deficits related to plan of care for management of CAD, HTN, HLD, and Depression, anxiety, and chronic pain  Chronic Disease Management support and education needs related to CAD, HTN, HLD, and Depression, anxiety, and chronic pain   RNCM Clinical Goal(s):  Patient will verbalize basic understanding of CAD, HTN, HLD, Anxiety, Depression, and Chronic pain  disease process and self health management plan as evidenced by keeping appointments, calling the office for questions or concerns, following the plan of care for chronic conditions management, and working with the CCM team to optimize health and well being.  take all medications exactly as prescribed and will call provider for medication related questions as  evidenced by compliance with medications and calling for refills before running out of medications    attend all scheduled medical appointments: 10-25-2021  as evidenced by keeping appointments and calling for schedule change needs         demonstrate improved and ongoing adherence to prescribed treatment plan for CAD, HTN, HLD, Anxiety, Depression, and Chronic pain  as evidenced by stable VS, pain decreasing, no acute exacerbations of anxiety or depression, effective management of chronic conditions  demonstrate ongoing self health care management ability for effective management of chronic conditions as evidenced by working with the CCM team  through collaboration with Consulting civil engineer, provider, and care team.   Interventions: 1:1 collaboration with primary care provider regarding development and update of comprehensive plan of care as evidenced by provider attestation and co-signature Inter-disciplinary care team collaboration (see longitudinal plan of care) Evaluation of current treatment plan related to  self management and patient's adherence to plan as established by provider   CAD  (Status: Goal on Track (progressing): YES.) Long Term Goal  BP Readings from Last 3 Encounters:  10/03/21 123/75  05/06/21 139/79  04/26/21 110/77  Lab Results  Component Value Date   CHOL 129 04/26/2021   HDL 54 04/26/2021   LDLCALC 58 04/26/2021   TRIG 87 04/26/2021    Assessed understanding of CAD diagnosis Medications reviewed including medications utilized in CAD treatment plan Provided education on importance of blood pressure control in management of CAD; Provided education on Importance of limiting foods high in cholesterol; Counseled on importance of regular laboratory monitoring as prescribed. 10-11-2021: Has regular lab testing. Last in August of 2022. Reviewed Importance of taking all medications as prescribed Reviewed Importance of attending all scheduled provider appointments Advised to  report any changes in symptoms or exercise tolerance Advised patient to discuss changes in CAD and heart health with provider; Screening for signs and symptoms of depression related to chronic disease state;  Assessed social determinant of health barriers;   Anxiety and Depression   (Status: Goal on Track (progressing): YES.) Long Term Goal  Evaluation of current treatment plan related to Anxiety and Depression, Mental Health Concerns  self-management and patient's adherence to plan as established by provider. Discussed plans with patient for ongoing care management follow up and provided patient with direct contact information for care management team Advised patient to call the office for changes in mood, anxiety, depression, or new mental health concerns; Provided education to patient re: effective management of anxiety and depression. The patient sometimes gets very anxious about her health and that of her husband. She reaches out to the pcp when she is having exacerbations of her anxiety and depression. The patient has good control of her depression and anxiety at this time; Reviewed medications with patient and discussed compliance- the patient is compliant with medications ; Reviewed scheduled/upcoming provider appointments including 10-25-2021 ; Discussed plans with patient for ongoing care management follow up and provided patient with direct contact information for care management team; Advised patient to discuss new questions or concerns about her anxiety, depression and mental health with provider;  Hyperlipidemia:  (Status: Goal on Track (progressing): YES.) Long Term Goal  Lab Results  Component Value Date   CHOL 129 04/26/2021   HDL 54 04/26/2021   [place] 58 04/26/2021   TRIG 87 04/26/2021     Medication review performed; medication list updated in electronic medical record.  Provider established cholesterol goals reviewed; Counseled on importance of regular laboratory  monitoring as prescribed; Provided HLD educational materials; Reviewed role and benefits of statin for ASCVD risk reduction; Discussed strategies to manage statin-induced myalgias; Reviewed importance of limiting foods high in cholesterol;  Hypertension: (Status: Goal on Track (progressing): YES.) Long Term Goal  Last practice recorded BP readings:  BP Readings from Last 3 Encounters:  10/03/21 123/75  05/06/21 139/79  04/26/21 110/77  Most recent eGFR/CrCl:  Lab Results  Component Value Date   EGFR 73 04/26/2021    No components found for: CRCL  Evaluation of current treatment plan related to hypertension self management and patient's adherence to plan as established by provider;   Provided education to patient re: stroke prevention, s/s of heart attack and stroke; Reviewed prescribed diet heart healthy Reviewed medications with patient and discussed importance of compliance;  Discussed plans with patient for ongoing care management follow up and provided patient with direct contact information for care management team; Advised patient, providing education and rationale, to monitor blood pressure daily and record, calling PCP for findings outside established parameters;  Advised patient to discuss HTN and blood pressure changes  with provider; Provided education on prescribed diet heart healthy;  Discussed complications of poorly controlled blood pressure such as heart disease, stroke, circulatory complications, vision complications, kidney impairment, sexual dysfunction;   Pain:  (Status: Goal on Track (progressing): YES.) Long Term Goal  Pain assessment performed. 10-11-2021: The patient has pain in her right leg after a fall x 3 weeks ago. She has a pulled hamstring. The patient states that she is doing better and it is more sore than anything. She said at first she could not even walk. Education and support given. She will follow up with the pcp on 10-25-2021 Medications reviewed.  10-11-2021: Is compliant with medications Reviewed provider established plan for pain management; Discussed importance of adherence to all scheduled medical appointments; Counseled on the importance of reporting any/all new or changed pain symptoms or management strategies to pain management provider; Advised patient to report to care team affect of pain on daily activities; Discussed use of relaxation techniques and/or diversional activities to assist with pain reduction (distraction, imagery, relaxation, massage, acupressure, TENS, heat, and cold application; Reviewed with patient prescribed pharmacological and nonpharmacological pain relief strategies; Advised patient to discuss unresolved pain, changes in level or intensity of pain with provider;  Patient Goals/Self-Care Activities: Take medications as prescribed   Attend all scheduled provider appointments Call pharmacy for medication refills 3-7 days in advance of running out of medications Attend church or other social activities Perform all self care activities independently  Perform IADL's (shopping, preparing meals, housekeeping, managing finances) independently Call provider office for new concerns or questions  Work with the social worker to address care coordination needs and will continue to work with the clinical team to address health care and disease management related needs call the Suicide and Crisis Lifeline: 988 call the [place] National Suicide Prevention Lifeline: (562) 131-0573 or TTY: 517-102-5997 TTY (561)724-9862) to talk to a trained counselor call 1-800-273-TALK (toll free, 24 hour hotline) if experiencing a Mental Health or [place]  check blood pressure weekly choose a place to take my blood pressure (home, clinic or office, retail store) write blood pressure results in a log or diary learn about high blood pressure keep a blood pressure log take blood pressure log to all doctor appointments call  doctor for signs and symptoms of high blood pressure develop an action plan for high blood pressure keep all doctor appointments take medications for blood pressure exactly as prescribed report new symptoms to your doctor eat more whole grains, fruits and vegetables, lean meats and healthy fats - call for medicine refill 2 or 3 days before it runs out - take all medications exactly as prescribed - call doctor with any symptoms you believe are related to your medicine - call doctor when you experience any new symptoms - go to all doctor appointments as scheduled - adhere to prescribed diet: heart healthy       Plan:Telephone follow up appointment with care management team member scheduled for:  12-20-2021 and 0945 am  Noreene Larsson RN, MSN, [facility] Mobile: [phone]

## 2021-10-20 IMAGING — MG MM DIGITAL SCREENING BILAT W/ TOMO AND CAD
8 series · 8 of 24 positions shown · non-contrast
Comparison: Previous exam(s).

CLINICAL DATA: Screening. LEFT lumpectomy 6598

EXAM:
DIGITAL SCREENING BILATERAL MAMMOGRAM WITH TOMOSYNTHESIS AND CAD
TECHNIQUE: Bilateral screening digital craniocaudal and mediolateral oblique
mammograms were obtained. Bilateral screening digital breast
tomosynthesis was performed. The images were evaluated with
computer-aided detection.

[L CC synth-2D]
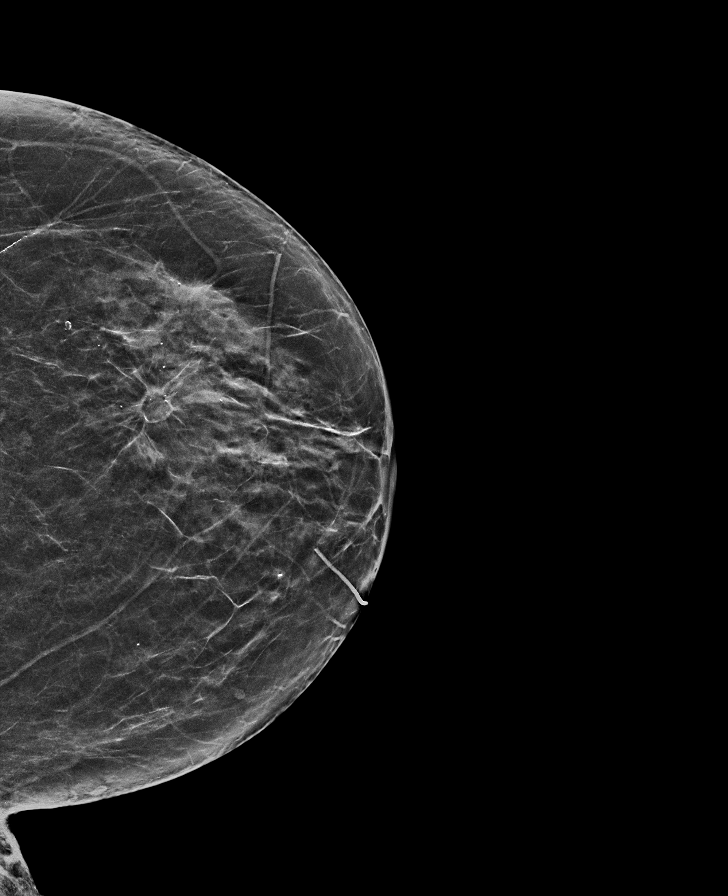

[R MLO synth-2D]
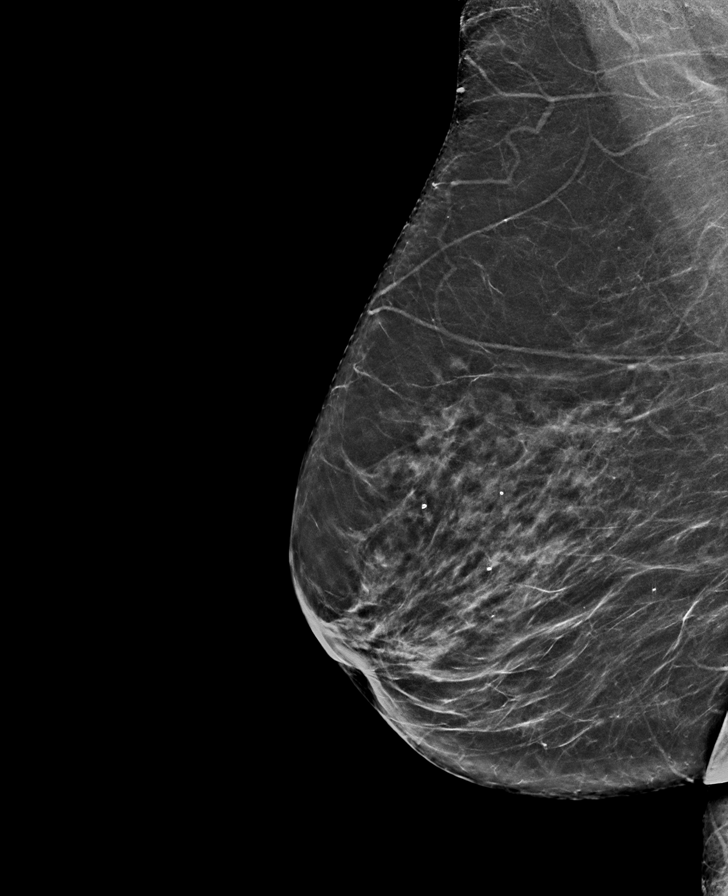

[R CC synth-2D]
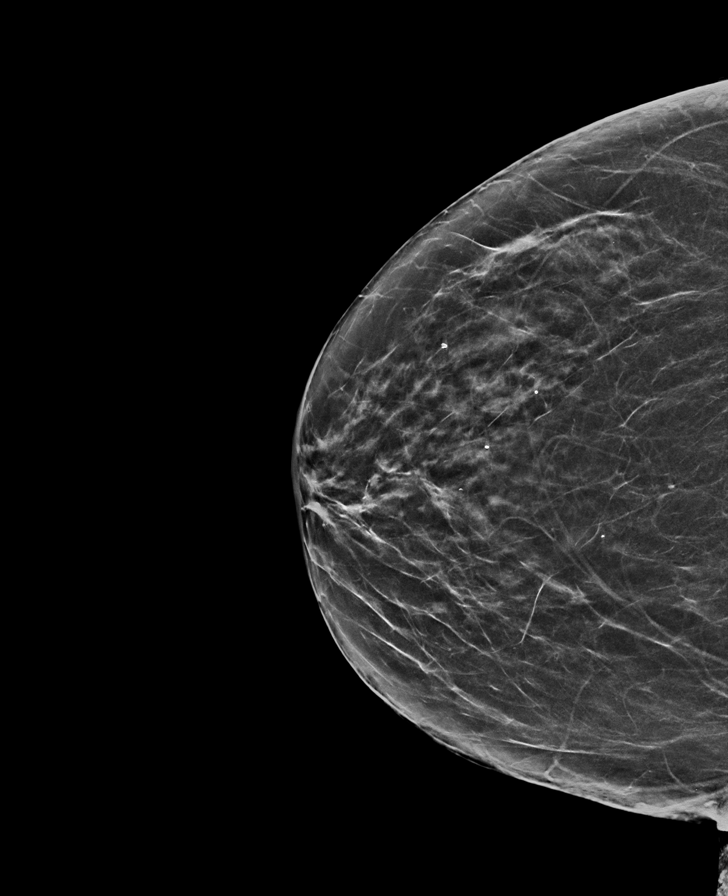

[L MLO synth-2D]
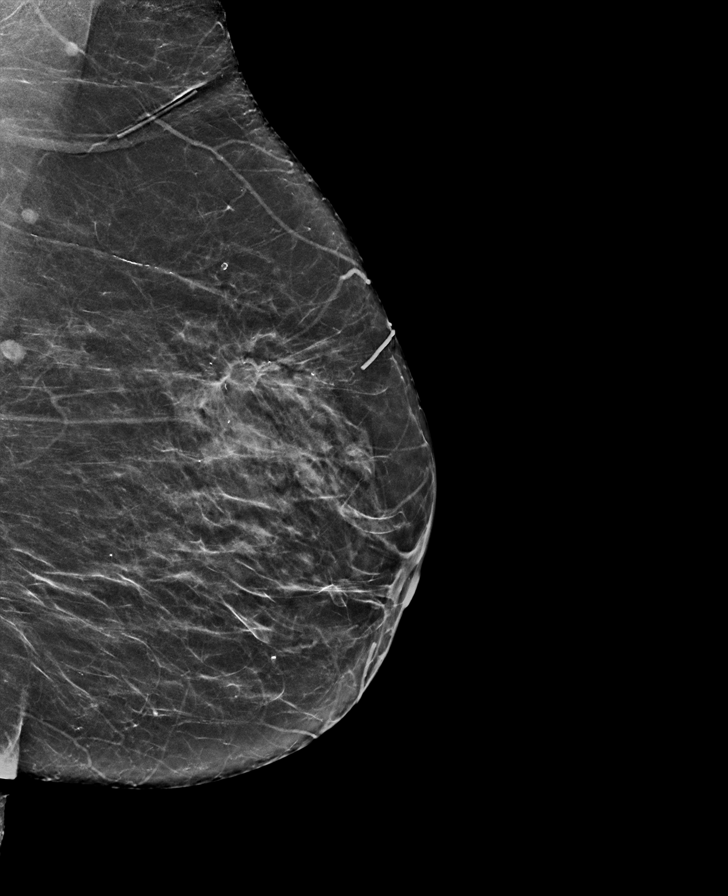

[L MLO tomo · tomo slice 31/61.0]
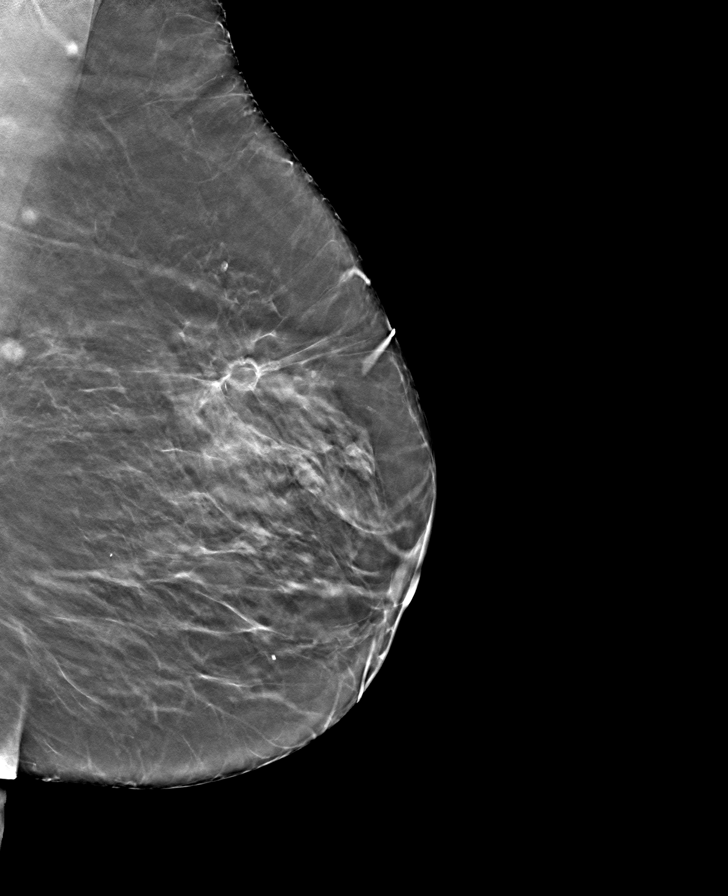

[R CC tomo · tomo slice 31/62.0]
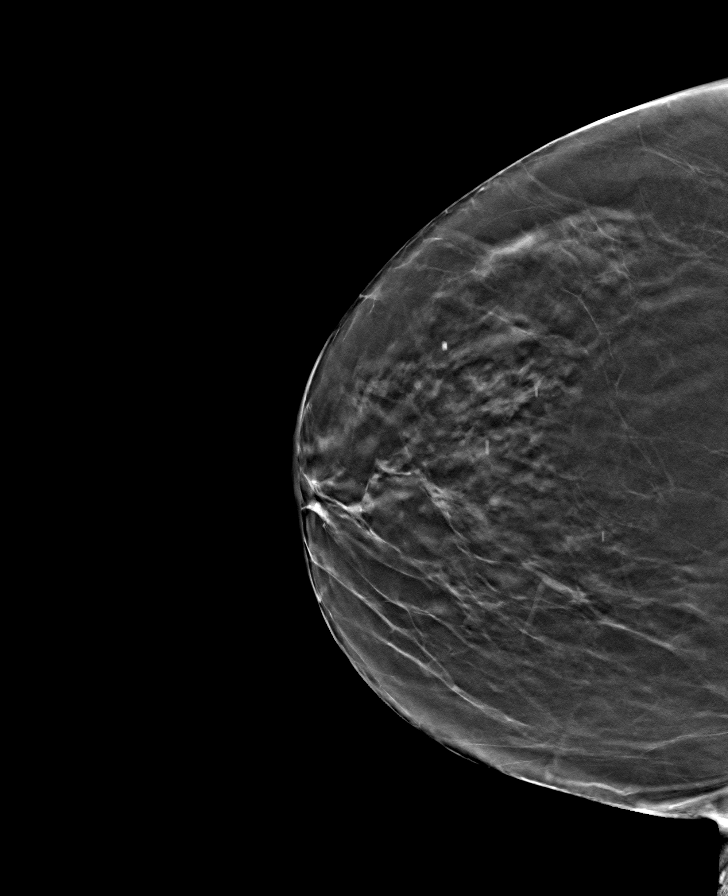

[R MLO tomo · tomo slice 33/64.0]
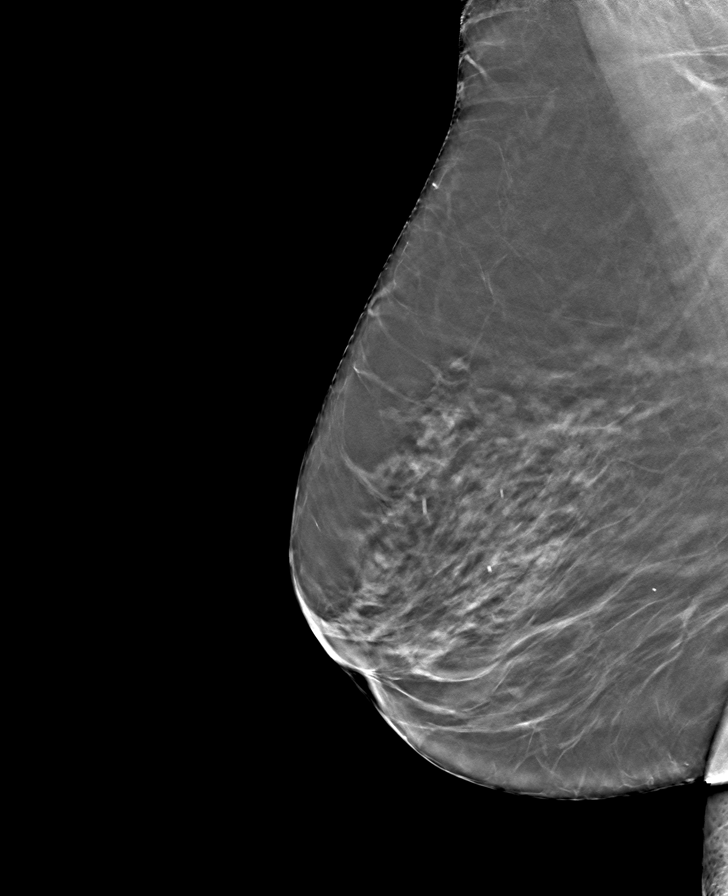

[L CC tomo · tomo slice 30/59.0]
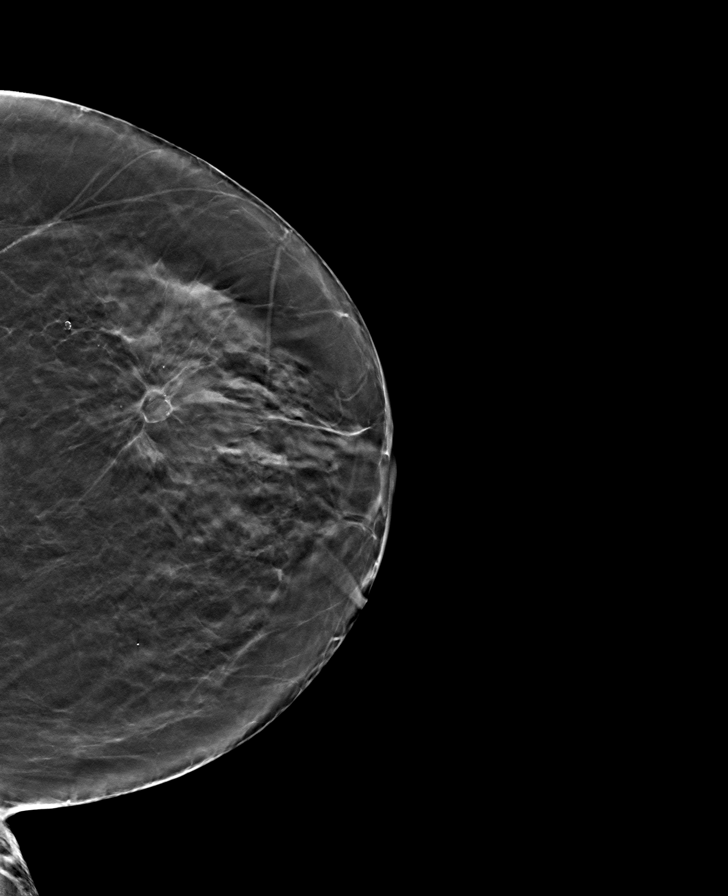

[8 of 24 positions shown; findings below may reference images not displayed]

ACR Breast Density Category b: There are scattered areas of
fibroglandular density.
FINDINGS: There are no findings suspicious for malignancy. There is density
and architectural distortion within the LEFT breast, consistent with
postsurgical changes. These are stable in comparison to prior.
IMPRESSION: No mammographic evidence of malignancy. A result letter of this
screening mammogram will be mailed directly to the patient.

RECOMMENDATION:
Screening mammogram in one year. (Code:P1-B-2N8)

BI-RADS CATEGORY  2: Benign.

## 2021-10-25 ENCOUNTER — Other Ambulatory Visit: Payer: Self-pay

## 2021-10-25 ENCOUNTER — Encounter: Payer: Self-pay | Admitting: Family Medicine

## 2021-10-25 ENCOUNTER — Ambulatory Visit (INDEPENDENT_AMBULATORY_CARE_PROVIDER_SITE_OTHER): Payer: Medicare Other | Admitting: Family Medicine

## 2021-10-25 VITALS — BP 129/78 | HR 71 | Temp 99.0°F | Ht 59.02 in | Wt 140.4 lb

## 2021-10-25 DIAGNOSIS — L900 Lichen sclerosus et atrophicus: Secondary | ICD-10-CM | POA: Diagnosis not present

## 2021-10-25 DIAGNOSIS — D692 Other nonthrombocytopenic purpura: Secondary | ICD-10-CM

## 2021-10-25 DIAGNOSIS — I10 Essential (primary) hypertension: Secondary | ICD-10-CM

## 2021-10-25 DIAGNOSIS — K219 Gastro-esophageal reflux disease without esophagitis: Secondary | ICD-10-CM | POA: Diagnosis not present

## 2021-10-25 DIAGNOSIS — I2510 Atherosclerotic heart disease of native coronary artery without angina pectoris: Secondary | ICD-10-CM

## 2021-10-25 DIAGNOSIS — I129 Hypertensive chronic kidney disease with stage 1 through stage 4 chronic kidney disease, or unspecified chronic kidney disease: Secondary | ICD-10-CM

## 2021-10-25 DIAGNOSIS — I739 Peripheral vascular disease, unspecified: Secondary | ICD-10-CM | POA: Diagnosis not present

## 2021-10-25 DIAGNOSIS — F411 Generalized anxiety disorder: Secondary | ICD-10-CM | POA: Diagnosis not present

## 2021-10-25 DIAGNOSIS — F339 Major depressive disorder, recurrent, unspecified: Secondary | ICD-10-CM

## 2021-10-25 DIAGNOSIS — E782 Mixed hyperlipidemia: Secondary | ICD-10-CM | POA: Diagnosis not present

## 2021-10-25 DIAGNOSIS — I700 Atherosclerosis of aorta: Secondary | ICD-10-CM

## 2021-10-25 DIAGNOSIS — M1A9XX0 Chronic gout, unspecified, without tophus (tophi): Secondary | ICD-10-CM

## 2021-10-25 LAB — MICROALBUMIN, URINE WAIVED
Creatinine, Urine Waived: 200 mg/dL (ref 10–300)
Microalb, Ur Waived: 150 mg/L — ABNORMAL HIGH (ref 0–19)

## 2021-10-25 MED ORDER — HYDRALAZINE HCL 25 MG PO TABS
25.0000 mg | ORAL_TABLET | Freq: Two times a day (BID) | ORAL | 1 refills | Status: DC
Start: 2021-10-25 — End: 2022-03-09

## 2021-10-25 MED ORDER — TIZANIDINE HCL 2 MG PO TABS
ORAL_TABLET | ORAL | 0 refills | Status: DC
Start: 2021-10-25 — End: 2022-05-05

## 2021-10-25 MED ORDER — ATENOLOL 50 MG PO TABS: 50.0000 mg | ORAL_TABLET | Freq: Every day | ORAL | 1 refills | Status: DC

## 2021-10-25 MED ORDER — BENAZEPRIL HCL 40 MG PO TABS: 40.0000 mg | ORAL_TABLET | Freq: Every day | ORAL | 1 refills | Status: DC

## 2021-10-25 MED ORDER — CLOBETASOL PROPIONATE 0.05 % EX OINT: TOPICAL_OINTMENT | CUTANEOUS | 1 refills | Status: DC

## 2021-10-25 MED ORDER — AMLODIPINE BESYLATE 10 MG PO TABS: 10.0000 mg | ORAL_TABLET | Freq: Every day | ORAL | 1 refills | Status: DC

## 2021-10-25 MED ORDER — AMITRIPTYLINE HCL 25 MG PO TABS: 25.0000 mg | ORAL_TABLET | Freq: Every day | ORAL | 1 refills | Status: DC

## 2021-10-25 MED ORDER — ATORVASTATIN CALCIUM 40 MG PO TABS: 40.0000 mg | ORAL_TABLET | Freq: Every evening | ORAL | 1 refills | Status: DC

## 2021-10-25 MED ORDER — OMEPRAZOLE 40 MG PO CPDR: DELAYED_RELEASE_CAPSULE | ORAL | 2 refills | Status: DC

## 2021-10-25 MED ORDER — FLUTICASONE PROPIONATE 50 MCG/ACT NA SUSP: 2.0000 | Freq: Every day | NASAL | 12 refills | Status: DC

## 2021-10-25 MED ORDER — SERTRALINE HCL 100 MG PO TABS: 150.0000 mg | ORAL_TABLET | Freq: Every day | ORAL | 1 refills | Status: DC

## 2021-10-25 MED ORDER — CLONAZEPAM 0.5 MG PO TABS
0.2500 mg | ORAL_TABLET | Freq: Two times a day (BID) | ORAL | 0 refills | Status: DC | PRN
Start: 2021-10-25 — End: 2022-11-03

## 2021-10-25 NOTE — Assessment & Plan Note (Signed)
Will keep BP and cholesterol under good control. Continue to monitor.  

## 2021-10-25 NOTE — Assessment & Plan Note (Signed)
Under good control on current regimen. Continue current regimen. Continue to monitor. Call with any concerns. Refills given. Labs drawn today.   

## 2021-10-25 NOTE — Progress Notes (Signed)
BP 129/78    Pulse 71    Temp 99 F (37.2 C) (Oral)    Ht 4' 11.02" (1.499 m)    Wt 140 lb 6.4 oz (63.7 kg)    SpO2 98%    BMI 28.34 kg/m    Subjective:    Patient ID: Marie Parker, female    DOB: 02-24-1946, 76 y.o.   MRN: [id number]  HPI: Marie Parker is a 76 y.o. female  Chief Complaint  Patient presents with   Hyperlipidemia   Hypertension   Allergic Rhinitis     Patient states she is having issues with her right ear. Patient states she notices she has some drainage down from her ear into her throat. Patient states this is an ongoing issue on and off. Patient states she is having her eyes watering as well. Patient would like to discuss with provider.    HYPERTENSION / HYPERLIPIDEMIA Satisfied with current treatment? yes Duration of hypertension: chronic BP monitoring frequency: not checking BP range:  BP medication side effects: no Past BP meds: atenolol, hydralazine, benazepril Duration of hyperlipidemia: chronic Cholesterol medication side effects: no Cholesterol supplements: none Past cholesterol medications: atrovastatin Medication compliance: excellent compliance Aspirin: yes Recent stressors: yes Recurrent headaches: no Visual changes: no Palpitations: no Dyspnea: no Chest pain: no Lower extremity edema: no Dizzy/lightheaded: no  No gout flare. Tolerating her medicine well.   ANXIETY/STRESS Duration: chronic Status:controlled Anxious mood: yes  Excessive worrying: yes Irritability: no  Sweating: no Nausea: no Palpitations:no Hyperventilation: no Panic attacks: no Agoraphobia: no  Obscessions/compulsions: no Depressed mood: no Depression screen [facility] 2/9 10/25/2021 10/03/2021 04/26/2021 04/08/2021 04/05/2021  Decreased Interest 0 0 0 0 0  Down, Depressed, Hopeless 0 0 0 0 0  PHQ - 2 Score 0 0 0 0 0  Altered sleeping _0 - -  Tired, decreased energy _1 - -  Change in appetite 0 0 0 - -  Feeling bad or failure about yourself  0 0 0 - -   Trouble concentrating 0 0 0 - -  Moving slowly or fidgety/restless 0 0 0 - -  Suicidal thoughts 0 0 0 - -  PHQ-9 Score _2 - -  Difficult doing work/chores - - Not difficult at all - -  Some recent data might be hidden   Anhedonia: no Weight changes: no Insomnia: no   Hypersomnia: no Fatigue/loss of energy: no Feelings of worthlessness: no Feelings of guilt: no Impaired concentration/indecisiveness: no Suicidal ideations: no  Crying spells: no Recent Stressors/Life Changes: no   Relationship problems: no   Family stress: no     Financial stress: no    Job stress: no    Recent death/loss: no   Relevant past medical, surgical, family and social history reviewed and updated as indicated. Interim medical history since our last visit reviewed. Allergies and medications reviewed and updated.  Review of Systems  Constitutional: Negative.   Respiratory: Negative.    Cardiovascular: Negative.   Gastrointestinal: Negative.   Musculoskeletal: Negative.   Neurological: Negative.   Psychiatric/Behavioral: Negative.     Per HPI unless specifically indicated above     Objective:    BP 129/78    Pulse 71    Temp 99 F (37.2 C) (Oral)    Ht 4' 11.02" (1.499 m)    Wt 140 lb 6.4 oz (63.7 kg)    SpO2 98%    BMI 28.34 kg/m   Wt Readings from Last 3 Encounters:  10/25/21 140 lb 6.4 oz (63.7 kg)  10/03/21 143 lb 3.2 oz (65 kg)  05/06/21 141 lb (64 kg)    Physical Exam Vitals and nursing note reviewed.  Constitutional:      General: She is not in acute distress.    Appearance: Normal appearance. She is not ill-appearing, toxic-appearing or diaphoretic.  HENT:     Head: Normocephalic and atraumatic.     Right Ear: External ear normal.     Left Ear: External ear normal.     Nose: Nose normal.     Mouth/Throat:     Mouth: Mucous membranes are moist.     Pharynx: Oropharynx is clear.  Eyes:     General: No scleral icterus.       Right eye: No discharge.        Left eye: No  discharge.     Extraocular Movements: Extraocular movements intact.     Conjunctiva/sclera: Conjunctivae normal.     Pupils: Pupils are equal, round, and reactive to light.  Cardiovascular:     Rate and Rhythm: Normal rate and regular rhythm.     Pulses: Normal pulses.     Heart sounds: Normal heart sounds. No murmur heard.   No friction rub. No gallop.  Pulmonary:     Effort: Pulmonary effort is normal. No respiratory distress.     Breath sounds: Normal breath sounds. No stridor. No wheezing, rhonchi or rales.  Chest:     Chest wall: No tenderness.  Musculoskeletal:        General: Normal range of motion.     Cervical back: Normal range of motion and neck supple.  Skin:    General: Skin is warm and dry.     Capillary Refill: Capillary refill takes less than 2 seconds.     Coloration: Skin is not jaundiced or pale.     Findings: No bruising, erythema, lesion or rash.  Neurological:     General: No focal deficit present.     Mental Status: She is alert and oriented to person, place, and time. Mental status is at baseline.  Psychiatric:        Mood and Affect: Mood normal.        Behavior: Behavior normal.        Thought Content: Thought content normal.        Judgment: Judgment normal.    Results for orders placed or performed in visit on 04/26/21  CBC with Differential/Platelet  Result Value Ref Range   WBC 4.8 3.4 - 10.8 x10E3/uL   RBC 4.16 3.77 - 5.28 x10E6/uL   Hemoglobin 12.3 11.1 - 15.9 g/dL   Hematocrit 36.7 34.0 - 46.6 %   MCV 88 79 - 97 fL   MCH 29.6 26.6 - 33.0 pg   MCHC 33.5 31.5 - 35.7 g/dL   RDW 13.4 11.7 - 15.4 %   Platelets 225 150 - 450 x10E3/uL   Neutrophils 67 Not Estab. %   Lymphs 21 Not Estab. %   Monocytes 7 Not Estab. %   Eos 5 Not Estab. %   Basos 0 Not Estab. %   Neutrophils Absolute 3.2 1.4 - 7.0 x10E3/uL   Lymphocytes Absolute 1.0 0.7 - 3.1 x10E3/uL   Monocytes Absolute 0.4 0.1 - 0.9 x10E3/uL   EOS (ABSOLUTE) 0.3 0.0 - 0.4 x10E3/uL    Basophils Absolute 0.0 0.0 - 0.2 x10E3/uL   Immature Granulocytes 0 Not Estab. %   Immature Grans (Abs) 0.0 0.0 - 0.1 x10E3/uL  Comprehensive metabolic panel  Result Value Ref Range   Glucose 105 (H) 65 - 99 mg/dL   BUN 12 8 - 27 mg/dL   Creatinine, Ser 0.84 0.57 - 1.00 mg/dL   eGFR 73 >59 mL/min/1.73   BUN/Creatinine Ratio 14 12 - 28   Sodium 144 134 - 144 mmol/L   Potassium 3.9 3.5 - 5.2 mmol/L   Chloride 102 96 - 106 mmol/L   CO2 22 20 - 29 mmol/L   Calcium 9.0 8.7 - 10.3 mg/dL   Total Protein 6.8 6.0 - 8.5 g/dL   Albumin 4.5 3.7 - 4.7 g/dL   Globulin, Total 2.3 1.5 - 4.5 g/dL   Albumin/Globulin Ratio 2.0 1.2 - 2.2   Bilirubin Total 0.4 0.0 - 1.2 mg/dL   Alkaline Phosphatase 100 44 - 121 IU/L   AST 33 0 - 40 IU/L   ALT 20 0 - 32 IU/L  Lipid Panel w/o Chol/HDL Ratio  Result Value Ref Range   Cholesterol, Total 129 100 - 199 mg/dL   Triglycerides 87 0 - 149 mg/dL   HDL 54 >39 mg/dL   VLDL Cholesterol Cal 17 5 - 40 mg/dL   LDL Chol Calc (NIH) 58 0 - 99 mg/dL  Urinalysis, Routine w reflex microscopic  Result Value Ref Range   Specific Gravity, UA <1.005 (L) 1.005 - 1.030   pH, UA 5.5 5.0 - 7.5   Color, UA Yellow Yellow   Appearance Ur Clear Clear   Leukocytes,UA Negative Negative   Protein,UA Negative Negative/Trace   Glucose, UA Negative Negative   Ketones, UA Negative Negative   RBC, UA Negative Negative   Bilirubin, UA Negative Negative   Urobilinogen, Ur 0.2 0.2 - 1.0 mg/dL   Nitrite, UA Negative Negative  TSH  Result Value Ref Range   TSH 2.380 0.450 - 4.500 uIU/mL  Microalbumin, Urine Waived  Result Value Ref Range   Microalb, Ur Waived 30 (H) 0 - 19 mg/L   Creatinine, Urine Waived 200 10 - 300 mg/dL   Microalb/Creat Ratio <30 <30 mg/g  Uric acid  Result Value Ref Range   Uric Acid 5.9 3.1 - 7.9 mg/dL      Assessment & Plan:   Problem List Items Addressed This Visit       Cardiovascular and Mediastinum   Peripheral vascular disease (HCC)    Will  keep BP and cholesterol under good control. Continue to monitor.       Relevant Medications   amLODipine (NORVASC) 10 MG tablet   atenolol (TENORMIN) 50 MG tablet   atorvastatin (LIPITOR) 40 MG tablet   benazepril (LOTENSIN) 40 MG tablet   hydrALAZINE (APRESOLINE) 25 MG tablet   CAD (coronary artery disease)    Will keep BP and cholesterol under good control. Continue to monitor.       Relevant Medications   amLODipine (NORVASC) 10 MG tablet   atenolol (TENORMIN) 50 MG tablet   atorvastatin (LIPITOR) 40 MG tablet   benazepril (LOTENSIN) 40 MG tablet   hydrALAZINE (APRESOLINE) 25 MG tablet   Senile purpura ([place]) - Primary    Reassured patient. Continue to monitor.       Relevant Medications   amLODipine (NORVASC) 10 MG tablet   atenolol (TENORMIN) 50 MG tablet   atorvastatin (LIPITOR) 40 MG tablet   benazepril (LOTENSIN) 40 MG tablet   hydrALAZINE (APRESOLINE) 25 MG tablet   Other Relevant Orders   Comprehensive metabolic panel   CBC with Differential/Platelet   Aortic atherosclerosis (HCC)  Will keep BP and cholesterol under good control. Continue to monitor.       Relevant Medications   amLODipine (NORVASC) 10 MG tablet   atenolol (TENORMIN) 50 MG tablet   atorvastatin (LIPITOR) 40 MG tablet   benazepril (LOTENSIN) 40 MG tablet   hydrALAZINE (APRESOLINE) 25 MG tablet   Other Relevant Orders   Comprehensive metabolic panel     Digestive   GERD (gastroesophageal reflux disease)    Under good control on current regimen. Continue current regimen. Continue to monitor. Call with any concerns. Refills given. Labs drawn today.       Relevant Medications   omeprazole (PRILOSEC) 40 MG capsule   Other Relevant Orders   Comprehensive metabolic panel     Genitourinary   Benign hypertensive renal disease    Under good control on current regimen. Continue current regimen. Continue to monitor. Call with any concerns. Refills given. Labs drawn today.       Relevant  Orders   Comprehensive metabolic panel   Microalbumin, Urine Waived     Other   Lichen sclerosus   Relevant Orders   Comprehensive metabolic panel   Hyperlipidemia    Under good control on current regimen. Continue current regimen. Continue to monitor. Call with any concerns. Refills given. Labs drawn today.       Relevant Medications   amLODipine (NORVASC) 10 MG tablet   atenolol (TENORMIN) 50 MG tablet   atorvastatin (LIPITOR) 40 MG tablet   benazepril (LOTENSIN) 40 MG tablet   hydrALAZINE (APRESOLINE) 25 MG tablet   Other Relevant Orders   Comprehensive metabolic panel   Lipid Panel w/o Chol/HDL Ratio   Gout    Under good control on current regimen. Continue current regimen. Continue to monitor. Call with any concerns. Refills given. Labs drawn today.       Relevant Orders   Comprehensive metabolic panel   Uric acid   GAD (generalized anxiety disorder)    Under good control on current regimen. Continue current regimen. Continue to monitor. Call with any concerns. Refills given. Labs drawn today.       Relevant Medications   amitriptyline (ELAVIL) 25 MG tablet   sertraline (ZOLOFT) 100 MG tablet   Other Visit Diagnoses     Generalized anxiety disorder       Relevant Medications   amitriptyline (ELAVIL) 25 MG tablet   sertraline (ZOLOFT) 100 MG tablet   Other Relevant Orders   Comprehensive metabolic panel        Follow up plan: Return in about 6 months (around 04/24/2022) for physical.

## 2021-10-25 NOTE — Assessment & Plan Note (Signed)
Reassured patient. Continue to monitor.  

## 2021-10-26 LAB — COMPREHENSIVE METABOLIC PANEL
ALT: 13 IU/L (ref 0–32)
AST: 21 IU/L (ref 0–40)
Albumin/Globulin Ratio: 1.8 (ref 1.2–2.2)
Albumin: 4.3 g/dL (ref 3.7–4.7)
Alkaline Phosphatase: 101 IU/L (ref 44–121)
BUN/Creatinine Ratio: 13 (ref 12–28)
BUN: 11 mg/dL (ref 8–27)
Bilirubin Total: 0.2 mg/dL (ref 0.0–1.2)
CO2: 25 mmol/L (ref 20–29)
Calcium: 9.3 mg/dL (ref 8.7–10.3)
Chloride: 103 mmol/L (ref 96–106)
Creatinine, Ser: 0.86 mg/dL (ref 0.57–1.00)
Globulin, Total: 2.4 g/dL (ref 1.5–4.5)
Glucose: 99 mg/dL (ref 70–99)
Potassium: 4.4 mmol/L (ref 3.5–5.2)
Sodium: 143 mmol/L (ref 134–144)
Total Protein: 6.7 g/dL (ref 6.0–8.5)
eGFR: 71 mL/min/{1.73_m2} (ref >59)

## 2021-10-26 LAB — CBC WITH DIFFERENTIAL/PLATELET
Basophils Absolute: 0 10*3/uL (ref 0.0–0.2)
Basos: 0 %
EOS (ABSOLUTE): 0.3 10*3/uL (ref 0.0–0.4)
Eos: 5 %
Hematocrit: 35.4 % (ref 34.0–46.6)
Hemoglobin: 11.1 g/dL (ref 11.1–15.9)
Immature Grans (Abs): 0 10*3/uL (ref 0.0–0.1)
Immature Granulocytes: 0 %
Lymphocytes Absolute: 1.3 10*3/uL (ref 0.7–3.1)
Lymphs: 21 %
MCH: 28.5 pg (ref 26.6–33.0)
MCHC: 31.4 g/dL — ABNORMAL LOW (ref 31.5–35.7)
MCV: 91 fL (ref 79–97)
Monocytes Absolute: 0.5 10*3/uL (ref 0.1–0.9)
Monocytes: 8 %
Neutrophils Absolute: 4 10*3/uL (ref 1.4–7.0)
Neutrophils: 66 %
Platelets: 332 10*3/uL (ref 150–450)
RBC: 3.9 x10E6/uL (ref 3.77–5.28)
RDW: 14.5 % (ref 11.7–15.4)
WBC: 6 10*3/uL (ref 3.4–10.8)

## 2021-10-26 LAB — LIPID PANEL W/O CHOL/HDL RATIO
Cholesterol, Total: 155 mg/dL (ref 100–199)
HDL: 50 mg/dL (ref >39)
LDL Chol Calc (NIH): 81 mg/dL (ref 0–99)
Triglycerides: 134 mg/dL (ref 0–149)
VLDL Cholesterol Cal: 24 mg/dL (ref 5–40)

## 2021-10-26 LAB — URIC ACID: Uric Acid: 5.4 mg/dL (ref 3.1–7.9)

## 2021-12-20 ENCOUNTER — Ambulatory Visit (INDEPENDENT_AMBULATORY_CARE_PROVIDER_SITE_OTHER): Payer: Medicare Other

## 2021-12-20 ENCOUNTER — Telehealth: Payer: Medicare Other

## 2021-12-20 DIAGNOSIS — I2510 Atherosclerotic heart disease of native coronary artery without angina pectoris: Secondary | ICD-10-CM

## 2021-12-20 DIAGNOSIS — M19021 Primary osteoarthritis, right elbow: Secondary | ICD-10-CM

## 2021-12-20 DIAGNOSIS — F419 Anxiety disorder, unspecified: Secondary | ICD-10-CM

## 2021-12-20 DIAGNOSIS — F411 Generalized anxiety disorder: Secondary | ICD-10-CM

## 2021-12-20 DIAGNOSIS — E782 Mixed hyperlipidemia: Secondary | ICD-10-CM

## 2021-12-20 DIAGNOSIS — F339 Major depressive disorder, recurrent, unspecified: Secondary | ICD-10-CM

## 2021-12-20 DIAGNOSIS — I10 Essential (primary) hypertension: Secondary | ICD-10-CM

## 2021-12-20 NOTE — Chronic Care Management (AMB) (Signed)
?Chronic Care Management  ? ?CCM RN Visit Note ? ?12/20/2021 ?Name: Marie Parker MRN: [id number] DOB: 24-Dec-1945 ? ?Subjective: ?Marie Parker is a 76 y.o. year old female who is a primary care patient of Valerie Roys, DO. The care management team was consulted for assistance with disease management and care coordination needs.   ? ?Engaged with patient by telephone for follow up visit in response to provider referral for case management and/or care coordination services.  ? ?Consent to Services:  ?The patient was given information about Chronic Care Management services, agreed to services, and gave verbal consent prior to initiation of services.  Please see initial visit note for detailed documentation.  ? ?Patient agreed to services and verbal consent obtained.  ? ?Assessment: Review of patient past medical history, allergies, medications, health status, including review of consultants reports, laboratory and other test data, was performed as part of comprehensive evaluation and provision of chronic care management services.  ? ?SDOH (Social Determinants of Health) assessments and interventions performed:   ? ?CCM Care Plan ? ?Allergies  ?Allergen Reactions  ? Alendronate Nausea Only and Other (See Comments)  ?  Other reaction(s): NAUSEA ?GI upset  ? Amoxicillin-Pot Clavulanate Diarrhea  ? Crestor [Rosuvastatin] Nausea Only  ? Cyclobenzaprine Other (See Comments)  ?  Vertigo, couldn't focus eyes  ? Erythromycin Other (See Comments)  ?  GI upset ?  ? Fosamax [Alendronate Sodium] Nausea And Vomiting  ? ? ?Outpatient Encounter Medications as of 12/20/2021  ?Medication Sig  ? acetaminophen (TYLENOL) 650 MG CR tablet Take 1,300 mg by mouth daily as needed for pain.   ? amitriptyline (ELAVIL) 25 MG tablet Take 1 tablet (25 mg total) by mouth at bedtime.  ? amLODipine (NORVASC) 10 MG tablet Take 1 tablet (10 mg total) by mouth daily.  ? aspirin 81 MG tablet Take 1 tablet (81 mg total) by mouth daily. (Patient  taking differently: Take 81 mg by mouth at bedtime.)  ? atenolol (TENORMIN) 50 MG tablet Take 1 tablet (50 mg total) by mouth daily.  ? atorvastatin (LIPITOR) 40 MG tablet Take 1 tablet (40 mg total) by mouth at bedtime.  ? benazepril (LOTENSIN) 40 MG tablet Take 1 tablet (40 mg total) by mouth daily.  ? CALCIUM-VITAMIN D PO Take 2 tablets by mouth daily.  ? clobetasol ointment (TEMOVATE) 0.05 % Daily at night for 6-12 weeks, if improvement, decrease to 2-3x per week  ? clonazePAM (KLONOPIN) 0.5 MG tablet Take 0.5-1 tablets (0.25-0.5 mg total) by mouth 2 (two) times daily as needed for anxiety.  ? fluticasone (FLONASE) 50 MCG/ACT nasal spray Place 2 sprays into both nostrils daily.  ? hydrALAZINE (APRESOLINE) 25 MG tablet Take 1 tablet (25 mg total) by mouth 2 (two) times daily.  ? HYDROcodone-acetaminophen (NORCO) 5-325 MG tablet Take 1 tablet by mouth every 6 (six) hours as needed for moderate pain.  ? letrozole (FEMARA) 2.5 MG tablet TAKE 1 TABLET(2.5 MG) BY MOUTH DAILY (Patient taking differently: Take 2.5 mg by mouth daily.)  ? lidocaine (LIDODERM) 5 % Place 1 patch onto the skin daily. Remove & Discard patch within 12 hours or as directed by MD  ? Multiple Vitamin (MULTIVITAMIN WITH MINERALS) TABS tablet Take 1 tablet by mouth daily.  ? niacin 500 MG tablet Take 1,000 mg by mouth at bedtime.  ? omeprazole (PRILOSEC) 40 MG capsule TAKE 1 CAPSULE BY MOUTH  TWICE DAILY BEFORE MEALS  ? Probiotic Product (PROBIOTIC-10 PO) Take by mouth daily.   ?  sertraline (ZOLOFT) 100 MG tablet Take 1.5 tablets (150 mg total) by mouth daily.  ? tiZANidine (ZANAFLEX) 2 MG tablet TAKE 1 TABLET(2 MG) BY MOUTH EVERY 6 HOURS AS NEEDED FOR MUSCLE SPASMS  ? Turmeric 500 MG TABS Take 500 mg by mouth daily.   ? ?No facility-administered encounter medications on file as of 12/20/2021.  ? ? ?Patient Active Problem List  ? Diagnosis Date Noted  ? Hamstring tear 10/03/2021  ? Aortic atherosclerosis ([place]) 11/23/2020  ? History of colonic polyps    ? Osteoarthritis of elbow 11/28/2019  ? Bunion 09/09/2019  ? Osteopenia 06/17/2018  ? Malignant neoplasm of overlapping sites of left female breast ([place]) 04/10/2018  ? Liver cyst 02/22/2018  ? Senile purpura ([place]) 12/27/2017  ? GAD (generalized anxiety disorder) 06/21/2016  ? Personal history of tobacco use, presenting hazards to health 02/02/2016  ? Multiple pulmonary nodules determined by computed tomography of lung 12/21/2015  ? Benign hypertensive renal disease 06/22/2015  ? Hyperlipidemia 06/22/2015  ? Gout 06/22/2015  ? Lichen sclerosus 00/92/3300  ? GERD (gastroesophageal reflux disease) 01/22/2013  ? Peripheral vascular disease ([place]) 01/22/2013  ? CAD (coronary artery disease) 01/22/2013  ? ? ?Conditions to be addressed/monitored:CAD, HTN, HLD, Anxiety, Depression, and Chronic pain and discomfort ? ?Care Plan : RNCM: General Plan of Care (Adult) for Chronic Disease Management and Care Coordination Needs  ?Updates made by Vanita Ingles, RN since 12/20/2021 12:00 AM  ?  ? ?Problem: RNCM: Development of plan of care for chronic disease management (CAD, HTN, HLD, Depression, Anxiety, and Chronic pain)   ?Priority: High  ?  ? ?Long-Range Goal: Effective Management of plan of care for chronic disease management (CAD, HTN, HLD, Depression, Anxiety, and Chronic pain)   ?Start Date: 10/11/2021  ?Expected End Date: 10/11/2022  ?Priority: High  ?Note:   ?Current Barriers:  ?Knowledge Deficits related to plan of care for management of CAD, HTN, HLD, and Depression, anxiety, and chronic pain  ?Chronic Disease Management support and education needs related to CAD, HTN, HLD, and Depression, anxiety, and chronic pain  ? ?RNCM Clinical Goal(s):  ?Patient will verbalize basic understanding of CAD, HTN, HLD, Anxiety, Depression, and Chronic pain  disease process and self health management plan as evidenced by keeping appointments, calling the office for questions or concerns, following the plan of care for chronic conditions  management, and working with the CCM team to optimize health and well being.  ?take all medications exactly as prescribed and will call provider for medication related questions as evidenced by compliance with medications and calling for refills before running out of medications    ?attend all scheduled medical appointments: 04-24-2022  as evidenced by keeping appointments and calling for schedule change needs         ?demonstrate improved and ongoing adherence to prescribed treatment plan for CAD, HTN, HLD, Anxiety, Depression, and Chronic pain  as evidenced by stable VS, pain decreasing, no acute exacerbations of anxiety or depression, effective management of chronic conditions  ?demonstrate ongoing self health care management ability for effective management of chronic conditions as evidenced by working with the CCM team  through collaboration with Consulting civil engineer, provider, and care team.  ? ?Interventions: ?1:1 collaboration with primary care provider regarding development and update of comprehensive plan of care as evidenced by provider attestation and co-signature ?Inter-disciplinary care team collaboration (see longitudinal plan of care) ?Evaluation of current treatment plan related to  self management and patient's adherence to plan as established by provider ? ? ?  CAD  (Status: Goal on Track (progressing): YES.) Long Term Goal  ?BP Readings from Last 3 Encounters:  ?10/25/21 129/78  ?10/03/21 123/75  ?05/06/21 139/79  ?  ?Lab Results  ?Component Value Date  ? CHOL 155 10/25/2021  ? HDL 50 10/25/2021  ? [place] 81 10/25/2021  ? TRIG 134 10/25/2021  ?  ?Assessed understanding of CAD diagnosis. 12-20-2021: The patient is knowledgeable about her CAD and effective management of CAD ?Medications reviewed including medications utilized in CAD treatment plan. 12-20-2021: The patient is compliant with medications. Denies any medication needs.  ?Provided education on importance of blood pressure control in management of  CAD. 12-20-2021: The patient has good control of her blood pressures; ?Provided education on Importance of limiting foods high in cholesterol. 12-20-2021: The patient is compliant with heart healthy diet.

## 2021-12-20 NOTE — Patient Instructions (Signed)
Visit Information ? ?Thank you for taking time to visit with me today. Please don't hesitate to contact me if I can be of assistance to you before our next scheduled telephone appointment. ? ?Following are the goals we discussed today:  ?RNCM Clinical Goal(s):  ?Patient will verbalize basic understanding of CAD, HTN, HLD, Anxiety, Depression, and Chronic pain  disease process and self health management plan as evidenced by keeping appointments, calling the office for questions or concerns, following the plan of care for chronic conditions management, and working with the CCM team to optimize health and well being.  ?take all medications exactly as prescribed and will call provider for medication related questions as evidenced by compliance with medications and calling for refills before running out of medications    ?attend all scheduled medical appointments: 04-24-2022  as evidenced by keeping appointments and calling for schedule change needs         ?demonstrate improved and ongoing adherence to prescribed treatment plan for CAD, HTN, HLD, Anxiety, Depression, and Chronic pain  as evidenced by stable VS, pain decreasing, no acute exacerbations of anxiety or depression, effective management of chronic conditions  ?demonstrate ongoing self health care management ability for effective management of chronic conditions as evidenced by working with the CCM team  through collaboration with Consulting civil engineer, provider, and care team.  ?  ?Interventions: ?1:1 collaboration with primary care provider regarding development and update of comprehensive plan of care as evidenced by provider attestation and co-signature ?Inter-disciplinary care team collaboration (see longitudinal plan of care) ?Evaluation of current treatment plan related to  self management and patient's adherence to plan as established by provider ?  ?  ?CAD  (Status: Goal on Track (progressing): YES.) Long Term Goal  ?   ?BP Readings from Last 3 Encounters:   ?10/25/21 129/78  ?10/03/21 123/75  ?05/06/21 139/79  ?  ?     ?Lab Results  ?Component Value Date  ?  CHOL 155 10/25/2021  ?  HDL 50 10/25/2021  ?  [place] 81 10/25/2021  ?  TRIG 134 10/25/2021  ?  ?Assessed understanding of CAD diagnosis. 12-20-2021: The patient is knowledgeable about her CAD and effective management of CAD ?Medications reviewed including medications utilized in CAD treatment plan. 12-20-2021: The patient is compliant with medications. Denies any medication needs.  ?Provided education on importance of blood pressure control in management of CAD. 12-20-2021: The patient has good control of her blood pressures; ?Provided education on Importance of limiting foods high in cholesterol. 12-20-2021: The patient is compliant with heart healthy diet. Is mindful of foods she should avoid.  ?Counseled on importance of regular laboratory monitoring as prescribed. 12-20-2021: Has regular lab testing. Last in August of 2022. ?Reviewed Importance of taking all medications as prescribed. 12-20-2021: Is compliant with medications and taking as prescribed ?Reviewed Importance of attending all scheduled provider appointments. Next appointment with pcp on 04-24-2022 ?Advised to report any changes in symptoms or exercise tolerance ?Advised patient to discuss changes in CAD and heart health with provider; ?Screening for signs and symptoms of depression related to chronic disease state;  ?Assessed social determinant of health barriers;  ?  ?Anxiety and Depression   (Status: Goal on Track (progressing): YES.) Long Term Goal  ?Evaluation of current treatment plan related to Anxiety and Depression, Mental Health Concerns  self-management and patient's adherence to plan as established by provider. 12-20-2021: The patient is doing well with management of her anxiety and depression. The patient is still working  and this helps her mental health and well being. Her and her husband are planning a trip to one of their favorite eating  places tonight. She feels in the future they will not be able to do some of the things they can do now so she wants to go and do things with him when she can. She is happy about the warmer weather.  ?Discussed plans with patient for ongoing care management follow up and provided patient with direct contact information for care management team ?Advised patient to call the office for changes in mood, anxiety, depression, or new mental health concerns; ?Provided education to patient re: effective management of anxiety and depression. The patient sometimes gets very anxious about her health and that of her husband. She reaches out to the pcp when she is having exacerbations of her anxiety and depression. The patient has good control of her depression and anxiety at this time; ?Reviewed medications with patient and discussed compliance- the patient is compliant with medications ; ?Reviewed scheduled/upcoming provider appointments including 04-24-2022 ; ?Discussed plans with patient for ongoing care management follow up and provided patient with direct contact information for care management team; ?Advised patient to discuss new questions or concerns about her anxiety, depression and mental health with provider; ?  ?Hyperlipidemia:  (Status: Goal on Track (progressing): YES.) Long Term Goal  ?     ?Lab Results  ?Component Value Date  ?  CHOL 155 10/25/2021  ?  HDL 50 10/25/2021  ?  [place] 81 10/25/2021  ?  TRIG 134 10/25/2021  ?  ?  ?Medication review performed; medication list updated in electronic medical record. 12-20-2021: The patient has good control of her cholesterol at this time. She takes atorvastatin 40 mg QD as directed ?Provider established cholesterol goals reviewed. 12-20-2021: The patient is at goal; ?Counseled on importance of regular laboratory monitoring as prescribed. 12-20-2021: the patient is at goal and has regular lab testing; ?Provided HLD educational materials; ?Reviewed role and benefits of statin  for ASCVD risk reduction; ?Discussed strategies to manage statin-induced myalgias; ?Reviewed importance of limiting foods high in cholesterol; ?  ?Hypertension: (Status: Goal on Track (progressing): YES.) Long Term Goal  ?Last practice recorded BP readings:  ?   ?BP Readings from Last 3 Encounters:  ?10/25/21 129/78  ?10/03/21 123/75  ?05/06/21 139/79  ?Most recent eGFR/CrCl:  ?     ?Lab Results  ?Component Value Date  ?  EGFR 71 10/25/2021  ?  No components found for: CRCL ?  ?Evaluation of current treatment plan related to hypertension self management and patient's adherence to plan as established by provider. 12-20-2021: The patient has normal blood pressures is doing well. The patient denies any issues with heart health or HTN;   ?Provided education to patient re: stroke prevention, s/s of heart attack and stroke; ?Reviewed prescribed diet heart healthy ?Reviewed medications with patient and discussed importance of compliance. 12-20-2021: Is complaint with medications. ;  ?Discussed plans with patient for ongoing care management follow up and provided patient with direct contact information for care management team; ?Advised patient, providing education and rationale, to monitor blood pressure daily and record, calling PCP for findings outside established parameters;  ?Advised patient to discuss HTN and blood pressure changes  with provider; ?Provided education on prescribed diet heart healthy;  ?Discussed complications of poorly controlled blood pressure such as heart disease, stroke, circulatory complications, vision complications, kidney impairment, sexual dysfunction;  ?  ?Pain:  (Status: Goal on Track (progressing): YES.) Long  Term Goal  ?Pain assessment performed. 10-11-2021: The patient has pain in her right leg after a fall x 3 weeks ago. She has a pulled hamstring. The patient states that she is doing better and it is more sore than anything. She said at first she could not even walk. Education and support  given. She will follow up with the pcp on 10-25-2021. 12-20-2021: The patient states that she has no pain right now but will pay for mopping the floors tomorrow. She does have a lot of pain and discomfort i

## 2021-12-21 DIAGNOSIS — H43811 Vitreous degeneration, right eye: Secondary | ICD-10-CM | POA: Diagnosis not present

## 2021-12-21 DIAGNOSIS — Z961 Presence of intraocular lens: Secondary | ICD-10-CM | POA: Diagnosis not present

## 2021-12-25 DIAGNOSIS — F339 Major depressive disorder, recurrent, unspecified: Secondary | ICD-10-CM

## 2021-12-25 DIAGNOSIS — E782 Mixed hyperlipidemia: Secondary | ICD-10-CM

## 2021-12-25 DIAGNOSIS — I2510 Atherosclerotic heart disease of native coronary artery without angina pectoris: Secondary | ICD-10-CM | POA: Diagnosis not present

## 2021-12-25 DIAGNOSIS — M19022 Primary osteoarthritis, left elbow: Secondary | ICD-10-CM

## 2021-12-25 DIAGNOSIS — M19021 Primary osteoarthritis, right elbow: Secondary | ICD-10-CM | POA: Diagnosis not present

## 2021-12-25 DIAGNOSIS — I10 Essential (primary) hypertension: Secondary | ICD-10-CM | POA: Diagnosis not present

## 2022-01-25 ENCOUNTER — Ambulatory Visit (INDEPENDENT_AMBULATORY_CARE_PROVIDER_SITE_OTHER): Payer: Medicare Other | Admitting: Licensed Clinical Social Worker

## 2022-01-25 DIAGNOSIS — F411 Generalized anxiety disorder: Secondary | ICD-10-CM

## 2022-01-25 DIAGNOSIS — I2510 Atherosclerotic heart disease of native coronary artery without angina pectoris: Secondary | ICD-10-CM

## 2022-01-25 NOTE — Chronic Care Management (AMB) (Signed)
    Clinical Social Work  Care Management   Phone Outreach    01/25/2022 Name: Marie Parker MRN: [id number] DOB: December 23, 1945  Marie Parker is a 76 y.o. year old female who is a primary care patient of Valerie Roys, DO .   Reason for referral: Mental Health Counseling and Resources.    F/U phone call today to assess needs, progress and barriers with care plan goals.   Patient was unable to keep phone appointment today and requested to reschedule.  Plan:Appointment was rescheduled with CCM LCSW on 02/08/22  Review of patient status, including review of consultants reports, relevant laboratory and other test results, and collaboration with appropriate care team members and the patient's provider was performed as part of comprehensive patient evaluation and provision of care management services.     Christa See, MSW, [email or web address] Phone [phone] 10:07 AM

## 2022-02-07 ENCOUNTER — Ambulatory Visit (INDEPENDENT_AMBULATORY_CARE_PROVIDER_SITE_OTHER): Payer: Medicare Other

## 2022-02-07 ENCOUNTER — Telehealth: Payer: Medicare Other

## 2022-02-07 DIAGNOSIS — F339 Major depressive disorder, recurrent, unspecified: Secondary | ICD-10-CM

## 2022-02-07 DIAGNOSIS — E782 Mixed hyperlipidemia: Secondary | ICD-10-CM

## 2022-02-07 DIAGNOSIS — M79671 Pain in right foot: Secondary | ICD-10-CM

## 2022-02-07 DIAGNOSIS — I2510 Atherosclerotic heart disease of native coronary artery without angina pectoris: Secondary | ICD-10-CM

## 2022-02-07 DIAGNOSIS — F411 Generalized anxiety disorder: Secondary | ICD-10-CM

## 2022-02-07 DIAGNOSIS — M19021 Primary osteoarthritis, right elbow: Secondary | ICD-10-CM

## 2022-02-07 DIAGNOSIS — I10 Essential (primary) hypertension: Secondary | ICD-10-CM

## 2022-02-07 NOTE — Patient Instructions (Signed)
Visit Information  Thank you for taking time to visit with me today. Please don't hesitate to contact me if I can be of assistance to you before our next scheduled telephone appointment.  Following are the goals we discussed today:  (CAD  (Status: Goal Met.) Long Term Goal  Closing goal and care plan 02-07-2022    BP Readings from Last 3 Encounters:  10/25/21 129/78  10/03/21 123/75  05/06/21 139/79         Lab Results  Component Value Date    CHOL 155 10/25/2021    HDL 50 10/25/2021    LDLCALC 81 10/25/2021    TRIG 134 10/25/2021    Assessed understanding of CAD diagnosis. 02-07-2022: The patient is knowledgeable about her CAD and effective management of CAD. Patient is stable and is effectively managing her CAD and heart health. No new concerns. Goal met and closed.  Medications reviewed including medications utilized in CAD treatment plan. 02-07-2022: The patient is compliant with medications. Denies any medication needs.  Provided education on importance of blood pressure control in management of CAD. 02-07-2022: The patient has good control of her blood pressures; Provided education on Importance of limiting foods high in cholesterol. 02-07-2022: The patient is compliant with heart healthy diet. Is mindful of foods she should avoid.  Counseled on importance of regular laboratory monitoring as prescribed. 02-07-2022: Has regular lab testing. Last in February of 2023. Reviewed Importance of taking all medications as prescribed. 02-07-2022: Is compliant with medications and taking as prescribed Reviewed Importance of attending all scheduled provider appointments. Next appointment with pcp on 04-24-2022, reminder given today Advised to report any changes in symptoms or exercise tolerance Advised patient to discuss changes in CAD and heart health with provider; Screening for signs and symptoms of depression related to chronic disease state;  Assessed social determinant of health barriers;     Anxiety and Depression   (Status: Goal Met.) Long Term Goal 02-07-2022: Goals met and care plan closed for anxiety and depression Evaluation of current treatment plan related to Anxiety and Depression, Mental Health Concerns  self-management and patient's adherence to plan as established by provider. 12-20-2021: The patient is doing well with management of her anxiety and depression. The patient is still working and this helps her mental health and well being. Her and her husband are planning a trip to one of their favorite eating places tonight. She feels in the future they will not be able to do some of the things they can do now so she wants to go and do things with him when she can. She is happy about the warmer weather. 02-07-2022: The patient states her mental health and well being is stable. She and her husband are going to the beach for a few days tomorrow. She is excited about this as the beach is her happy place. The patient denies any new concerns with depression and anxiety. Goals met and care plan closed. Knows to call for changes or new needs. Discussed plans with patient for ongoing care management follow up and provided patient with direct contact information for care management team Advised patient to call the office for changes in mood, anxiety, depression, or new mental health concerns. 02-07-2022: Reminder given to call for changes in anxiety, depression, or mood; Provided education to patient re: effective management of anxiety and depression. The patient sometimes gets very anxious about her health and that of her husband. She reaches out to the pcp when she is having exacerbations of  her anxiety and depression. The patient has good control of her depression and anxiety at this time; Reviewed medications with patient and discussed compliance- the patient is compliant with medications ; Reviewed scheduled/upcoming provider appointments including 04-24-2022 ; Discussed plans with patient for  ongoing care management follow up and provided patient with direct contact information for care management team; Advised patient to discuss new questions or concerns about her anxiety, depression and mental health with provider;   Hyperlipidemia:  (Status: Goal Met.) Long Term Goal 02-07-2022: Goal met and the care plan closed      Lab Results  Component Value Date    CHOL 155 10/25/2021    HDL 50 10/25/2021    LDLCALC 81 10/25/2021    TRIG 134 10/25/2021      Medication review performed; medication list updated in electronic medical record. 02-07-2022: The patient has good control of her cholesterol at this time. She takes atorvastatin 40 mg QD as directed Provider established cholesterol goals reviewed. 02-07-2022: The patient is at goal; Counseled on importance of regular laboratory monitoring as prescribed. 02-07-2022: the patient is at goal and has regular lab testing; Provided HLD educational materials; Reviewed role and benefits of statin for ASCVD risk reduction; Discussed strategies to manage statin-induced myalgias; Reviewed importance of limiting foods high in cholesterol. 02-07-2022: The patient is compliant with heart healthy diet   Hypertension: (Status: Goal Met.) Long Term Goal 02-07-2022: Goal met and care plan closed. Last practice recorded BP readings:     BP Readings from Last 3 Encounters:  10/25/21 129/78  10/03/21 123/75  05/06/21 139/79  Most recent eGFR/CrCl:       Lab Results  Component Value Date    EGFR 71 10/25/2021    No components found for: CRCL   Evaluation of current treatment plan related to hypertension self management and patient's adherence to plan as established by provider. 02-07-2022: The patient has normal blood pressures is doing well. The patient denies any issues with heart health or HTN;   Provided education to patient re: stroke prevention, s/s of heart attack and stroke; Reviewed prescribed diet heart healthy Reviewed medications with  patient and discussed importance of compliance. 02-07-2022: Is complaint with medications. ;  Discussed plans with patient for ongoing care management follow up and provided patient with direct contact information for care management team; Advised patient, providing education and rationale, to monitor blood pressure daily and record, calling PCP for findings outside established parameters;  Advised patient to discuss HTN and blood pressure changes  with provider; Provided education on prescribed diet heart healthy;  Discussed complications of poorly controlled blood pressure such as heart disease, stroke, circulatory complications, vision complications, kidney impairment, sexual dysfunction;    Pain:  (Status: Goal Met.) Long Term Goal 02-07-2022: Goal met and care plan closed Pain assessment performed. 10-11-2021: The patient has pain in her right leg after a fall x 3 weeks ago. She has a pulled hamstring. The patient states that she is doing better and it is more sore than anything. She said at first she could not even walk. Education and support given. She will follow up with the pcp on 10-25-2021. 12-20-2021: The patient states that she has no pain right now but will pay for mopping the floors tomorrow. She does have a lot of pain and discomfort in her shoulders and generalized due to arthritis. 02-06-2022: The patient is stable and doing well with her pain control. Denies any new concerns related to pain. Knows to call  for changes or new needs. Medications reviewed. 12-20-2021: Is compliant with medications. Only takes Tylenol for pain relief and it does not help much. Did ask about getting a script for Tramadol as this has helped in the past. Will collaborate with pcp for recommendations. The patient states it is fine whatever the pcp decides and she will follow up in August. 02-07-2022: The patient is stable and denies any new medication needs. Reviewed provider established plan for pain  management; Discussed importance of adherence to all scheduled medical appointments. 02-07-2022: Next appointment with pcp on 04-24-2022 Counseled on the importance of reporting any/all new or changed pain symptoms or management strategies to pain management provider; Advised patient to report to care team affect of pain on daily activities; Discussed use of relaxation techniques and/or diversional activities to assist with pain reduction (distraction, imagery, relaxation, massage, acupressure, TENS, heat, and cold application; Reviewed with patient prescribed pharmacological and nonpharmacological pain relief strategies; Advised patient to discuss unresolved pain, changes in level or intensity of pain with provider;   02-07-2022: No further follow up required at this time. Goals of care have been met and the patient is stable. The patient agrees to call the [facility] for new concerns or changes. The patient has the [facility] number to call for new concerns or questions.  Please call the care guide team at [phone] if you need to cancel or reschedule your appointment.   If you are experiencing a Mental Health or [place] or need someone to talk to, please call the Suicide and Crisis Lifeline: 988 call the [place] National Suicide Prevention Lifeline: (229) 222-0629 or TTY: 671-811-4234 TTY (510)314-3174) to talk to a trained counselor call 1-800-273-TALK (toll free, 24 hour hotline)   Patient verbalizes understanding of instructions and care plan provided today and agrees to view in [place]. Active MyChart status and patient understanding of how to access instructions and care plan via MyChart confirmed with patient.     Noreene Larsson RN, MSN, [facility] Mobile: [phone]

## 2022-02-07 NOTE — Chronic Care Management (AMB) (Signed)
Chronic Care Management   CCM RN Visit Note  02/07/2022 Name: Marie Parker MRN: [id number] DOB: 1946/03/17  Subjective: Marie Parker is a 76 y.o. year old female who is a primary care patient of Valerie Roys, DO. The care management team was consulted for assistance with disease management and care coordination needs.    Engaged with patient by telephone for follow up visit in response to provider referral for case management and/or care coordination services. Goals of care met and the patient agrees to call the [facility] for changes if needed.  Consent to Services:  The patient was given information about Chronic Care Management services, agreed to services, and gave verbal consent prior to initiation of services.  Please see initial visit note for detailed documentation.   Patient agreed to services and verbal consent obtained.   Assessment: Review of patient past medical history, allergies, medications, health status, including review of consultants reports, laboratory and other test data, was performed as part of comprehensive evaluation and provision of chronic care management services.   SDOH (Social Determinants of Health) assessments and interventions performed:    CCM Care Plan  Allergies  Allergen Reactions   Alendronate Nausea Only and Other (See Comments)    Other reaction(s): NAUSEA GI upset   Amoxicillin-Pot Clavulanate Diarrhea   Crestor [Rosuvastatin] Nausea Only   Cyclobenzaprine Other (See Comments)    Vertigo, couldn't focus eyes   Erythromycin Other (See Comments)    GI upset    Fosamax [Alendronate Sodium] Nausea And Vomiting    Outpatient Encounter Medications as of 02/07/2022  Medication Sig   acetaminophen (TYLENOL) 650 MG CR tablet Take 1,300 mg by mouth daily as needed for pain.    amitriptyline (ELAVIL) 25 MG tablet Take 1 tablet (25 mg total) by mouth at bedtime.   amLODipine (NORVASC) 10 MG tablet Take 1 tablet (10 mg total) by mouth daily.    aspirin 81 MG tablet Take 1 tablet (81 mg total) by mouth daily. (Patient taking differently: Take 81 mg by mouth at bedtime.)   atenolol (TENORMIN) 50 MG tablet Take 1 tablet (50 mg total) by mouth daily.   atorvastatin (LIPITOR) 40 MG tablet Take 1 tablet (40 mg total) by mouth at bedtime.   benazepril (LOTENSIN) 40 MG tablet Take 1 tablet (40 mg total) by mouth daily.   CALCIUM-VITAMIN D PO Take 2 tablets by mouth daily.   clobetasol ointment (TEMOVATE) 0.05 % Daily at night for 6-12 weeks, if improvement, decrease to 2-3x per week   clonazePAM (KLONOPIN) 0.5 MG tablet Take 0.5-1 tablets (0.25-0.5 mg total) by mouth 2 (two) times daily as needed for anxiety.   fluticasone (FLONASE) 50 MCG/ACT nasal spray Place 2 sprays into both nostrils daily.   hydrALAZINE (APRESOLINE) 25 MG tablet Take 1 tablet (25 mg total) by mouth 2 (two) times daily.   HYDROcodone-acetaminophen (NORCO) 5-325 MG tablet Take 1 tablet by mouth every 6 (six) hours as needed for moderate pain.   letrozole (FEMARA) 2.5 MG tablet TAKE 1 TABLET(2.5 MG) BY MOUTH DAILY (Patient taking differently: Take 2.5 mg by mouth daily.)   lidocaine (LIDODERM) 5 % Place 1 patch onto the skin daily. Remove & Discard patch within 12 hours or as directed by MD   Multiple Vitamin (MULTIVITAMIN WITH MINERALS) TABS tablet Take 1 tablet by mouth daily.   niacin 500 MG tablet Take 1,000 mg by mouth at bedtime.   omeprazole (PRILOSEC) 40 MG capsule TAKE 1 CAPSULE BY MOUTH  TWICE DAILY  BEFORE MEALS   Probiotic Product (PROBIOTIC-10 PO) Take by mouth daily.    sertraline (ZOLOFT) 100 MG tablet Take 1.5 tablets (150 mg total) by mouth daily.   tiZANidine (ZANAFLEX) 2 MG tablet TAKE 1 TABLET(2 MG) BY MOUTH EVERY 6 HOURS AS NEEDED FOR MUSCLE SPASMS   Turmeric 500 MG TABS Take 500 mg by mouth daily.    No facility-administered encounter medications on file as of 02/07/2022.    Patient Active Problem List   Diagnosis Date Noted   Hamstring tear  10/03/2021   Aortic atherosclerosis ([place]) 11/23/2020   History of colonic polyps    Osteoarthritis of elbow 11/28/2019   Bunion 09/09/2019   Osteopenia 06/17/2018   Malignant neoplasm of overlapping sites of left female breast ([place]) 04/10/2018   Liver cyst 02/22/2018   Senile purpura ([place]) 12/27/2017   GAD (generalized anxiety disorder) 06/21/2016   Personal history of tobacco use, presenting hazards to health 02/02/2016   Multiple pulmonary nodules determined by computed tomography of lung 12/21/2015   Benign hypertensive renal disease 06/22/2015   Hyperlipidemia 06/22/2015   Gout 16/05/9603   Lichen sclerosus 54/04/8118   GERD (gastroesophageal reflux disease) 01/22/2013   Peripheral vascular disease ([place]) 01/22/2013   CAD (coronary artery disease) 01/22/2013    Conditions to be addressed/monitored:CAD, HTN, HLD, Anxiety, Depression, and Chronic pain   Care Plan : RNCM: General Plan of Care (Adult) for Chronic Disease Management and Care Coordination Needs  Updates made by Vanita Ingles, RN since 02/07/2022 12:00 AM  Completed 02/07/2022   Problem: RNCM: Development of plan of care for chronic disease management (CAD, HTN, HLD, Depression, Anxiety, and Chronic pain) Resolved 02/07/2022  Priority: High     Long-Range Goal: Effective Management of plan of care for chronic disease management (CAD, HTN, HLD, Depression, Anxiety, and Chronic pain) Completed 02/07/2022  Start Date: 10/11/2021  Expected End Date: 10/11/2022  Priority: High  Note:   Current Barriers: Care plan goals met. The patient is stable and does not require frequent outreaches from the [facility]. The patient has the [facility] number to call for changes or new needs. Education and support given. Knowledge Deficits related to plan of care for management of CAD, HTN, HLD, and Depression, anxiety, and chronic pain  Chronic Disease Management support and education needs related to CAD, HTN, HLD, and Depression, anxiety, and chronic  pain   RNCM Clinical Goal(s):  Patient will verbalize basic understanding of CAD, HTN, HLD, Anxiety, Depression, and Chronic pain  disease process and self health management plan as evidenced by keeping appointments, calling the office for questions or concerns, following the plan of care for chronic conditions management, and working with the CCM team to optimize health and well being.  take all medications exactly as prescribed and will call provider for medication related questions as evidenced by compliance with medications and calling for refills before running out of medications    attend all scheduled medical appointments: 04-24-2022  as evidenced by keeping appointments and calling for schedule change needs         demonstrate improved and ongoing adherence to prescribed treatment plan for CAD, HTN, HLD, Anxiety, Depression, and Chronic pain  as evidenced by stable VS, pain decreasing, no acute exacerbations of anxiety or depression, effective management of chronic conditions  demonstrate ongoing self health care management ability for effective management of chronic conditions as evidenced by working with the CCM team  through collaboration with Consulting civil engineer, provider, and care team.   Interventions:  1:1 collaboration with primary care provider regarding development and update of comprehensive plan of care as evidenced by provider attestation and co-signature Inter-disciplinary care team collaboration (see longitudinal plan of care) Evaluation of current treatment plan related to  self management and patient's adherence to plan as established by provider   CAD  (Status: Goal Met.) Long Term Goal  Closing goal and care plan 02-07-2022 BP Readings from Last 3 Encounters:  10/25/21 129/78  10/03/21 123/75  05/06/21 139/79    Lab Results  Component Value Date   CHOL 155 10/25/2021   HDL 50 10/25/2021   Mora 81 10/25/2021   TRIG 134 10/25/2021    Assessed understanding of CAD  diagnosis. 02-07-2022: The patient is knowledgeable about her CAD and effective management of CAD. Patient is stable and is effectively managing her CAD and heart health. No new concerns. Goal met and closed.  Medications reviewed including medications utilized in CAD treatment plan. 02-07-2022: The patient is compliant with medications. Denies any medication needs.  Provided education on importance of blood pressure control in management of CAD. 02-07-2022: The patient has good control of her blood pressures; Provided education on Importance of limiting foods high in cholesterol. 02-07-2022: The patient is compliant with heart healthy diet. Is mindful of foods she should avoid.  Counseled on importance of regular laboratory monitoring as prescribed. 02-07-2022: Has regular lab testing. Last in February of 2023. Reviewed Importance of taking all medications as prescribed. 02-07-2022: Is compliant with medications and taking as prescribed Reviewed Importance of attending all scheduled provider appointments. Next appointment with pcp on 04-24-2022, reminder given today Advised to report any changes in symptoms or exercise tolerance Advised patient to discuss changes in CAD and heart health with provider; Screening for signs and symptoms of depression related to chronic disease state;  Assessed social determinant of health barriers;   Anxiety and Depression   (Status: Goal Met.) Long Term Goal 02-07-2022: Goals met and care plan closed for anxiety and depression Evaluation of current treatment plan related to Anxiety and Depression, Mental Health Concerns  self-management and patient's adherence to plan as established by provider. 12-20-2021: The patient is doing well with management of her anxiety and depression. The patient is still working and this helps her mental health and well being. Her and her husband are planning a trip to one of their favorite eating places tonight. She feels in the future they will not  be able to do some of the things they can do now so she wants to go and do things with him when she can. She is happy about the warmer weather. 02-07-2022: The patient states her mental health and well being is stable. She and her husband are going to the beach for a few days tomorrow. She is excited about this as the beach is her happy place. The patient denies any new concerns with depression and anxiety. Goals met and care plan closed. Knows to call for changes or new needs. Discussed plans with patient for ongoing care management follow up and provided patient with direct contact information for care management team Advised patient to call the office for changes in mood, anxiety, depression, or new mental health concerns. 02-07-2022: Reminder given to call for changes in anxiety, depression, or mood; Provided education to patient re: effective management of anxiety and depression. The patient sometimes gets very anxious about her health and that of her husband. She reaches out to the pcp when she is having exacerbations of her anxiety  and depression. The patient has good control of her depression and anxiety at this time; Reviewed medications with patient and discussed compliance- the patient is compliant with medications ; Reviewed scheduled/upcoming provider appointments including 04-24-2022 ; Discussed plans with patient for ongoing care management follow up and provided patient with direct contact information for care management team; Advised patient to discuss new questions or concerns about her anxiety, depression and mental health with provider;  Hyperlipidemia:  (Status: Goal Met.) Long Term Goal 02-07-2022: Goal met and the care plan closed Lab Results  Component Value Date   CHOL 155 10/25/2021   HDL 50 10/25/2021   LDLCALC 81 10/25/2021   TRIG 134 10/25/2021     Medication review performed; medication list updated in electronic medical record. 02-07-2022: The patient has good control of  her cholesterol at this time. She takes atorvastatin 40 mg QD as directed Provider established cholesterol goals reviewed. 02-07-2022: The patient is at goal; Counseled on importance of regular laboratory monitoring as prescribed. 02-07-2022: the patient is at goal and has regular lab testing; Provided HLD educational materials; Reviewed role and benefits of statin for ASCVD risk reduction; Discussed strategies to manage statin-induced myalgias; Reviewed importance of limiting foods high in cholesterol. 02-07-2022: The patient is compliant with heart healthy diet  Hypertension: (Status: Goal Met.) Long Term Goal 02-07-2022: Goal met and care plan closed. Last practice recorded BP readings:  BP Readings from Last 3 Encounters:  10/25/21 129/78  10/03/21 123/75  05/06/21 139/79  Most recent eGFR/CrCl:  Lab Results  Component Value Date   EGFR 71 10/25/2021    No components found for: CRCL  Evaluation of current treatment plan related to hypertension self management and patient's adherence to plan as established by provider. 02-07-2022: The patient has normal blood pressures is doing well. The patient denies any issues with heart health or HTN;   Provided education to patient re: stroke prevention, s/s of heart attack and stroke; Reviewed prescribed diet heart healthy Reviewed medications with patient and discussed importance of compliance. 02-07-2022: Is complaint with medications. ;  Discussed plans with patient for ongoing care management follow up and provided patient with direct contact information for care management team; Advised patient, providing education and rationale, to monitor blood pressure daily and record, calling PCP for findings outside established parameters;  Advised patient to discuss HTN and blood pressure changes  with provider; Provided education on prescribed diet heart healthy;  Discussed complications of poorly controlled blood pressure such as heart disease, stroke,  circulatory complications, vision complications, kidney impairment, sexual dysfunction;   Pain:  (Status: Goal Met.) Long Term Goal 02-07-2022: Goal met and care plan closed Pain assessment performed. 10-11-2021: The patient has pain in her right leg after a fall x 3 weeks ago. She has a pulled hamstring. The patient states that she is doing better and it is more sore than anything. She said at first she could not even walk. Education and support given. She will follow up with the pcp on 10-25-2021. 12-20-2021: The patient states that she has no pain right now but will pay for mopping the floors tomorrow. She does have a lot of pain and discomfort in her shoulders and generalized due to arthritis. 02-06-2022: The patient is stable and doing well with her pain control. Denies any new concerns related to pain. Knows to call for changes or new needs. Medications reviewed. 12-20-2021: Is compliant with medications. Only takes Tylenol for pain relief and it does not help much. Did  ask about getting a script for Tramadol as this has helped in the past. Will collaborate with pcp for recommendations. The patient states it is fine whatever the pcp decides and she will follow up in August. 02-07-2022: The patient is stable and denies any new medication needs. Reviewed provider established plan for pain management; Discussed importance of adherence to all scheduled medical appointments. 02-07-2022: Next appointment with pcp on 04-24-2022 Counseled on the importance of reporting any/all new or changed pain symptoms or management strategies to pain management provider; Advised patient to report to care team affect of pain on daily activities; Discussed use of relaxation techniques and/or diversional activities to assist with pain reduction (distraction, imagery, relaxation, massage, acupressure, TENS, heat, and cold application; Reviewed with patient prescribed pharmacological and nonpharmacological pain relief  strategies; Advised patient to discuss unresolved pain, changes in level or intensity of pain with provider;  Patient Goals/Self-Care Activities: Take medications as prescribed   Attend all scheduled provider appointments Call pharmacy for medication refills 3-7 days in advance of running out of medications Attend church or other social activities Perform all self care activities independently  Perform IADL's (shopping, preparing meals, housekeeping, managing finances) independently Call provider office for new concerns or questions  Work with the social worker to address care coordination needs and will continue to work with the clinical team to address health care and disease management related needs call the Suicide and Crisis Lifeline: 988 call the [place] National Suicide Prevention Lifeline: 440-634-9506 or TTY: (469) 467-6129 TTY 229 054 6491) to talk to a trained counselor call 1-800-273-TALK (toll free, 24 hour hotline) if experiencing a Mental Health or [place]  check blood pressure weekly choose a place to take my blood pressure (home, clinic or office, retail store) write blood pressure results in a log or diary learn about high blood pressure keep a blood pressure log take blood pressure log to all doctor appointments call doctor for signs and symptoms of high blood pressure develop an action plan for high blood pressure keep all doctor appointments take medications for blood pressure exactly as prescribed report new symptoms to your doctor eat more whole grains, fruits and vegetables, lean meats and healthy fats - call for medicine refill 2 or 3 days before it runs out - take all medications exactly as prescribed - call doctor with any symptoms you believe are related to your medicine - call doctor when you experience any new symptoms - go to all doctor appointments as scheduled - adhere to prescribed diet: heart healthy       Plan:No further follow  up required: the patient has met goals of care and care plans for [facility] have been closed. The patient agrees that she is stable and if needs arise she will reach out to the [facility] in the future.   Noreene Larsson RN, MSN, [facility] Mobile: [phone]

## 2022-02-08 ENCOUNTER — Ambulatory Visit: Payer: Medicare Other | Admitting: Licensed Clinical Social Worker

## 2022-02-08 DIAGNOSIS — M19021 Primary osteoarthritis, right elbow: Secondary | ICD-10-CM

## 2022-02-08 DIAGNOSIS — I2510 Atherosclerotic heart disease of native coronary artery without angina pectoris: Secondary | ICD-10-CM

## 2022-02-08 DIAGNOSIS — F411 Generalized anxiety disorder: Secondary | ICD-10-CM

## 2022-02-08 NOTE — Patient Instructions (Signed)
Visit Information  Thank you for taking time to visit with me today. Please don't hesitate to contact me if I can be of assistance to you before our next scheduled telephone appointment.  Following are the goals we discussed today:  Patient Self Care Activities/Goals:  Attend all scheduled provider appointments Call provider office for new concerns or questions Continue compliance with medication management Continue utilizing healthy coping skills  No follow up required. Pt completed care plan goals associated with CCM LCSW  If you are experiencing a Mental Health or [place] or need someone to talk to, please call the Suicide and Crisis Lifeline: 988 call 911   Patient verbalizes understanding of instructions and care plan provided today and agrees to view in [place]. Active MyChart status and patient understanding of how to access instructions and care plan via MyChart confirmed with patient.     Christa See, MSW, [email or web address] Phone [phone] 10:23 AM

## 2022-02-08 NOTE — Chronic Care Management (AMB) (Signed)
Chronic Care Management    Clinical Social Work Note  02/08/2022 Name: Marie Parker MRN: [id number] DOB: Dec 26, 1945  Marie Parker is a 76 y.o. year old female who is a primary care patient of Valerie Roys, DO. The CCM team was consulted to assist the patient with chronic disease management and/or care coordination needs related to: Mental Health Counseling and Resources.   Engaged with patient by telephone for follow up visit in response to provider referral for social work chronic care management and care coordination services.   Consent to Services:  The patient was given information about Chronic Care Management services, agreed to services, and gave verbal consent prior to initiation of services.  Please see initial visit note for detailed documentation.   Patient agreed to services and consent obtained.   Assessment: Review of patient past medical history, allergies, medications, and health status, including review of relevant consultants reports was performed today as part of a comprehensive evaluation and provision of chronic care management and care coordination services.     SDOH (Social Determinants of Health) assessments and interventions performed:    Advanced Directives Status: Not addressed in this encounter.  CCM Care Plan  Allergies  Allergen Reactions   Alendronate Nausea Only and Other (See Comments)    Other reaction(s): NAUSEA GI upset   Amoxicillin-Pot Clavulanate Diarrhea   Crestor [Rosuvastatin] Nausea Only   Cyclobenzaprine Other (See Comments)    Vertigo, couldn't focus eyes   Erythromycin Other (See Comments)    GI upset    Fosamax [Alendronate Sodium] Nausea And Vomiting    Outpatient Encounter Medications as of 02/08/2022  Medication Sig   acetaminophen (TYLENOL) 650 MG CR tablet Take 1,300 mg by mouth daily as needed for pain.    amitriptyline (ELAVIL) 25 MG tablet Take 1 tablet (25 mg total) by mouth at bedtime.   amLODipine (NORVASC)  10 MG tablet Take 1 tablet (10 mg total) by mouth daily.   aspirin 81 MG tablet Take 1 tablet (81 mg total) by mouth daily. (Patient taking differently: Take 81 mg by mouth at bedtime.)   atenolol (TENORMIN) 50 MG tablet Take 1 tablet (50 mg total) by mouth daily.   atorvastatin (LIPITOR) 40 MG tablet Take 1 tablet (40 mg total) by mouth at bedtime.   benazepril (LOTENSIN) 40 MG tablet Take 1 tablet (40 mg total) by mouth daily.   CALCIUM-VITAMIN D PO Take 2 tablets by mouth daily.   clobetasol ointment (TEMOVATE) 0.05 % Daily at night for 6-12 weeks, if improvement, decrease to 2-3x per week   clonazePAM (KLONOPIN) 0.5 MG tablet Take 0.5-1 tablets (0.25-0.5 mg total) by mouth 2 (two) times daily as needed for anxiety.   fluticasone (FLONASE) 50 MCG/ACT nasal spray Place 2 sprays into both nostrils daily.   hydrALAZINE (APRESOLINE) 25 MG tablet Take 1 tablet (25 mg total) by mouth 2 (two) times daily.   HYDROcodone-acetaminophen (NORCO) 5-325 MG tablet Take 1 tablet by mouth every 6 (six) hours as needed for moderate pain.   letrozole (FEMARA) 2.5 MG tablet TAKE 1 TABLET(2.5 MG) BY MOUTH DAILY (Patient taking differently: Take 2.5 mg by mouth daily.)   lidocaine (LIDODERM) 5 % Place 1 patch onto the skin daily. Remove & Discard patch within 12 hours or as directed by MD   Multiple Vitamin (MULTIVITAMIN WITH MINERALS) TABS tablet Take 1 tablet by mouth daily.   niacin 500 MG tablet Take 1,000 mg by mouth at bedtime.   omeprazole (PRILOSEC) 40 MG capsule  TAKE 1 CAPSULE BY MOUTH  TWICE DAILY BEFORE MEALS   Probiotic Product (PROBIOTIC-10 PO) Take by mouth daily.    sertraline (ZOLOFT) 100 MG tablet Take 1.5 tablets (150 mg total) by mouth daily.   tiZANidine (ZANAFLEX) 2 MG tablet TAKE 1 TABLET(2 MG) BY MOUTH EVERY 6 HOURS AS NEEDED FOR MUSCLE SPASMS   Turmeric 500 MG TABS Take 500 mg by mouth daily.    No facility-administered encounter medications on file as of 02/08/2022.    Patient Active  Problem List   Diagnosis Date Noted   Hamstring tear 10/03/2021   Aortic atherosclerosis ([place]) 11/23/2020   History of colonic polyps    Osteoarthritis of elbow 11/28/2019   Bunion 09/09/2019   Osteopenia 06/17/2018   Malignant neoplasm of overlapping sites of left female breast ([place]) 04/10/2018   Liver cyst 02/22/2018   Senile purpura ([place]) 12/27/2017   GAD (generalized anxiety disorder) 06/21/2016   Personal history of tobacco use, presenting hazards to health 02/02/2016   Multiple pulmonary nodules determined by computed tomography of lung 12/21/2015   Benign hypertensive renal disease 06/22/2015   Hyperlipidemia 06/22/2015   Gout 92/08/69   Lichen sclerosus 21/97/5883   GERD (gastroesophageal reflux disease) 01/22/2013   Peripheral vascular disease ([place]) 01/22/2013   CAD (coronary artery disease) 01/22/2013    Conditions to be addressed/monitored: CAD, Anxiety, and Osteoarthritis  Care Plan : General Social Work (Adult)  Updates made by Rebekah Chesterfield, LCSW since 02/08/2022 12:00 AM     Problem: SW- I want to continue taking good care of myself   Priority: Medium  Onset Date: 07/26/2020     Long-Range Goal: Anxiety Symptoms Monitored and Managed Completed 02/08/2022  Start Date: 10/21/2020  This Visit's Progress: On track  Recent Progress: On track  Priority: Medium  Note:   Current Barriers:  Financial constraints Limited social support Level of care concerns Mental Health Concerns  Social Isolation Limited access to caregiver- in terms of spouse Lacks knowledge of community resource: available personal care service resources and [facility] resource connection within the area Clinical Social Work Clinical Goal(s):  Over the next 90 days, client will work with SW to address concerns related to reducing anxiety and improving self-care Interventions: Patient interviewed and appropriate assessments performed Patient endorses management of depression and anxiety  symptoms Patient is practicing self-care strategies. She is not in need of any resource needs Mindfulness or Relaxation training provided Active listening / Reflection utilized  Emotional Support Provided Verbalization of feelings encouraged  LCSW provided reflective listening and implemented appropriate interventions to help suppport patient and her emotional needs  Discussed plans with patient for ongoing care management follow up and provided patient with direct contact information for care management team Patient Self Care Activities/Goals:  Attend all scheduled provider appointments Call provider office for new concerns or questions Continue compliance with medication management Continue utilizing healthy coping skills       Follow Up Plan: No follow up required. Patient met care plan goals associated with CCM LCSW      Christa See, MSW, [email or web address] Phone [phone] 10:22 AM

## 2022-02-23 ENCOUNTER — Other Ambulatory Visit: Payer: Self-pay | Admitting: Family Medicine

## 2022-02-23 DIAGNOSIS — F411 Generalized anxiety disorder: Secondary | ICD-10-CM

## 2022-02-23 DIAGNOSIS — M7542 Impingement syndrome of left shoulder: Secondary | ICD-10-CM | POA: Diagnosis not present

## 2022-02-24 DIAGNOSIS — E785 Hyperlipidemia, unspecified: Secondary | ICD-10-CM

## 2022-02-24 DIAGNOSIS — I2510 Atherosclerotic heart disease of native coronary artery without angina pectoris: Secondary | ICD-10-CM | POA: Diagnosis not present

## 2022-02-24 DIAGNOSIS — I10 Essential (primary) hypertension: Secondary | ICD-10-CM

## 2022-02-24 DIAGNOSIS — F32A Depression, unspecified: Secondary | ICD-10-CM

## 2022-02-24 NOTE — Telephone Encounter (Signed)
Requested Prescriptions  Pending Prescriptions Disp Refills  . amitriptyline (ELAVIL) 25 MG tablet [Pharmacy Med Name: Amitriptyline HCl 25 MG Oral Tablet] 90 tablet 1    Sig: TAKE 1 TABLET BY MOUTH AT  BEDTIME     Psychiatry:  Antidepressants - Heterocyclics (TCAs) Passed - 02/23/2022 10:43 PM      Passed - Valid encounter within last 6 months    Recent Outpatient Visits          4 months ago Senile purpura ([place])   [place], Megan P, DO   4 months ago Hamstring tear   [place], [place] T, NP   9 months ago COVID-19   [place], DO   10 months ago Routine general medical examination at a health care facility   [facility], [place], DO   10 months ago Hearing loss due to cerumen impaction, right   [place], [place], DO      Future Appointments            In 1 month  [place], [place]   In 1 month [facility], [place], DO [employer], [facility]

## 2022-03-07 DIAGNOSIS — L578 Other skin changes due to chronic exposure to nonionizing radiation: Secondary | ICD-10-CM | POA: Diagnosis not present

## 2022-03-07 DIAGNOSIS — Z859 Personal history of malignant neoplasm, unspecified: Secondary | ICD-10-CM | POA: Diagnosis not present

## 2022-03-07 DIAGNOSIS — D225 Melanocytic nevi of trunk: Secondary | ICD-10-CM | POA: Diagnosis not present

## 2022-03-07 DIAGNOSIS — L821 Other seborrheic keratosis: Secondary | ICD-10-CM | POA: Diagnosis not present

## 2022-03-07 DIAGNOSIS — Z872 Personal history of diseases of the skin and subcutaneous tissue: Secondary | ICD-10-CM | POA: Diagnosis not present

## 2022-03-09 ENCOUNTER — Other Ambulatory Visit: Payer: Self-pay | Admitting: Family Medicine

## 2022-03-09 NOTE — Telephone Encounter (Signed)
Requested Prescriptions  Pending Prescriptions Disp Refills  . atenolol (TENORMIN) 50 MG tablet [Pharmacy Med Name: Atenolol 50 MG Oral Tablet] 100 tablet 0    Sig: TAKE 1 TABLET BY MOUTH DAILY     Cardiovascular: Beta Blockers 2 Passed - 03/09/2022  6:32 AM      Passed - Cr in normal range and within 360 days    Creatinine  Date Value Ref Range Status  01/20/2013 1.05 0.60 - 1.30 mg/dL Final   Creatinine, Ser  Date Value Ref Range Status  10/25/2021 0.86 0.57 - 1.00 mg/dL Final         Passed - Last BP in normal range    BP Readings from Last 1 Encounters:  10/25/21 129/78         Passed - Last Heart Rate in normal range    Pulse Readings from Last 1 Encounters:  10/25/21 71         Passed - Valid encounter within last 6 months    Recent Outpatient Visits          4 months ago Senile purpura ([place])   [place], Megan P, DO   5 months ago Hamstring tear   [place], [place] T, NP   10 months ago COVID-19   [employer], Megan P, DO   10 months ago Routine general medical examination at a health care facility   [facility], [place], DO   11 months ago Hearing loss due to cerumen impaction, right   [employer], [place], DO      Future Appointments            In 1 month  [place], [facility]   In 1 month [place], [place], DO [place], [facility]           . amLODipine ([place]) 10 MG tablet [Pharmacy Med Name: amLODIPine Besylate 10 MG Oral Tablet] 100 tablet 0    Sig: TAKE 1 TABLET BY MOUTH DAILY     Cardiovascular: Calcium Channel Blockers 2 Passed - 03/09/2022  6:32 AM      Passed - Last BP in normal range    BP Readings from Last 1 Encounters:  10/25/21 129/78         Passed - Last Heart Rate in normal range    Pulse Readings from Last 1 Encounters:  10/25/21 71         Passed - Valid encounter within last 6 months    Recent  Outpatient Visits          4 months ago Senile purpura ([place])   [place], Megan P, DO   5 months ago Hamstring tear   [place], [place] T, NP   10 months ago COVID-19   [employer], Megan P, DO   10 months ago Routine general medical examination at a health care facility   [facility], [place], DO   11 months ago Hearing loss due to cerumen impaction, right   [employer], [place], DO      Future Appointments            In 1 month  [place], [facility]   In 1 month [place], Megan P, DO [place], [facility]           . hydrALAZINE (APRESOLINE) 25 MG tablet [Pharmacy Med Name:  hydrALAZINE HCl 25 MG Oral Tablet] 200 tablet 1    Sig: TAKE 1 TABLET BY MOUTH TWICE  DAILY     Cardiovascular:  Vasodilators Failed - 03/09/2022  6:32 AM      Failed - ANA Screen, Ifa, Serum in normal range and within 360 days    Anti Nuclear Antibody(ANA)  Date Value Ref Range Status  07/24/2017 Negative Negative Final         Passed - HCT in normal range and within 360 days    Hematocrit  Date Value Ref Range Status  10/25/2021 35.4 34.0 - 46.6 % Final         Passed - HGB in normal range and within 360 days    Hemoglobin  Date Value Ref Range Status  10/25/2021 11.1 11.1 - 15.9 g/dL Final         Passed - RBC in normal range and within 360 days    RBC  Date Value Ref Range Status  10/25/2021 3.90 3.77 - 5.28 x10E6/uL Final  04/15/2018 3.93 3.80 - 5.20 MIL/uL Final         Passed - WBC in normal range and within 360 days    WBC  Date Value Ref Range Status  10/25/2021 6.0 3.4 - 10.8 x10E3/uL Final  04/15/2018 4.8 3.6 - 11.0 K/uL Final         Passed - PLT in normal range and within 360 days    Platelets  Date Value Ref Range Status  10/25/2021 332 150 - 450 x10E3/uL Final         Passed - Last BP in normal range    BP Readings from Last 1 Encounters:   10/25/21 129/78         Passed - Valid encounter within last 12 months    Recent Outpatient Visits          4 months ago Senile purpura ([place])   [place], Megan P, DO   5 months ago Hamstring tear   [place], [place] T, NP   10 months ago COVID-19   [employer], Megan P, DO   10 months ago Routine general medical examination at a health care facility   [facility], [place] P, DO   11 months ago Hearing loss due to cerumen impaction, right   [employer], [place], DO      Future Appointments            In 1 month  [place], [place]   In 1 month [place], [place], DO [employer], [facility]           . atorvastatin (LIPITOR) 40 MG tablet [Pharmacy Med Name: Atorvastatin Calcium 40 MG Oral Tablet] 100 tablet 0    Sig: TAKE 1 TABLET BY MOUTH AT  BEDTIME     Cardiovascular:  Antilipid - Statins Failed - 03/09/2022  6:32 AM      Failed - Lipid Panel in normal range within the last 12 months    Cholesterol, Total  Date Value Ref Range Status  10/25/2021 155 100 - 199 mg/dL Final   Cholesterol Piccolo, Waived  Date Value Ref Range Status  06/21/2016 129 <200 mg/dL Final    Comment:                            Desirable                <  200                         Borderline High      200- 239                         High                     >239    LDL Chol Calc (NIH)  Date Value Ref Range Status  10/25/2021 81 0 - 99 mg/dL Final   HDL  Date Value Ref Range Status  10/25/2021 50 >39 mg/dL Final   Triglycerides  Date Value Ref Range Status  10/25/2021 134 0 - 149 mg/dL Final   Triglycerides Piccolo,Waived  Date Value Ref Range Status  06/21/2016 136 <150 mg/dL Final    Comment:                            Normal                   <150                         Borderline High     150 - 199                         High                200 - 499                          Very High                >499          Passed - Patient is not pregnant      Passed - Valid encounter within last 12 months    Recent Outpatient Visits          4 months ago Senile purpura ([place])   [place], Megan P, DO   5 months ago Hamstring tear   [place], [place] T, NP   10 months ago COVID-19   [employer], Megan P, DO   10 months ago Routine general medical examination at a health care facility   [facility], [place], DO   11 months ago Hearing loss due to cerumen impaction, right   [employer], [place], DO      Future Appointments            In 1 month  [place], [facility]   In 1 month [place], Megan P, DO [place], [facility]           . benazepril (LOTENSIN) 40 MG tablet [Pharmacy Med Name: Benazepril HCl 40 MG Oral Tablet] 100 tablet 0    Sig: TAKE 1 TABLET BY MOUTH DAILY     Cardiovascular:  ACE Inhibitors Passed - 03/09/2022  6:32 AM      Passed - Cr in normal range and within 180 days    Creatinine  Date Value Ref Range Status  01/20/2013 1.05 0.60 - 1.30 mg/dL Final   Creatinine, Ser  Date Value Ref Range Status  10/25/2021 0.86 0.57 - 1.00 mg/dL Final  Passed - K in normal range and within 180 days    Potassium  Date Value Ref Range Status  10/25/2021 4.4 3.5 - 5.2 mmol/L Final  01/20/2013 4.0 3.5 - 5.1 mmol/L Final         Passed - Patient is not pregnant      Passed - Last BP in normal range    BP Readings from Last 1 Encounters:  10/25/21 129/78         Passed - Valid encounter within last 6 months    Recent Outpatient Visits          4 months ago Senile purpura ([place])   [place], Megan P, DO   5 months ago Hamstring tear   [place], [place] T, NP   10 months ago COVID-19   [employer], Megan P, DO   10 months  ago Routine general medical examination at a health care facility   [facility], [place], DO   11 months ago Hearing loss due to cerumen impaction, right   [place], [place], DO      Future Appointments            In 1 month  [place], [place]   In 1 month [place], Megan P, DO [employer], [facility]

## 2022-03-15 ENCOUNTER — Ambulatory Visit: Payer: Self-pay | Admitting: *Deleted

## 2022-03-15 NOTE — Telephone Encounter (Signed)
Message from Sherron Flemings sent at 03/15/2022  3:29 PM EDT  Summary: leg pain   Patient states she is having pain in her lt leg for several wks   Please assist patient further           Call History   Type Contact Phone/Fax User  03/15/2022 03:28 PM EDT Phone (Incoming) Inetta Fermo "[name]" (Self) [phone] Jerilynn Mages) Leroy Kennedy R   Reason for Disposition  [1] MILD pain (e.g., does not interfere with normal activities) AND [2] present > 7 days  Answer Assessment - Initial Assessment Questions 1. ONSET: "When did the pain start?"      Having pain in left leg.   Had pain for several weeks.   2. LOCATION: "Where is the pain located?"      Hurts on side of leg and down the front.   I had knee cap surgery 6 yrs. Ago I don't know if that is causing the pain or not 3. PAIN: "How bad is the pain?"    (Scale 1-10; or mild, moderate, severe)   -  MILD (1-3): doesn't interfere with normal activities    -  MODERATE (4-7): interferes with normal activities (e.g., work or school) or awakens from sleep, limping    -  SEVERE (8-10): excruciating pain, unable to do any normal activities, unable to walk     Moderate 4. WORK OR EXERCISE: "Has there been any recent work or exercise that involved this part of the body?"      Knee cap broken 6 yrs ago and repaired with surgery 5. CAUSE: "What do you think is causing the leg pain?"     I'm not sure 6. OTHER SYMPTOMS: "Do you have any other symptoms?" (e.g., chest pain, back pain, breathing difficulty, swelling, rash, fever, numbness, weakness)     Sometimes I have pain in my butt cheek when I'm driving. 7. PREGNANCY: "Is there any chance you are pregnant?" "When was your last menstrual period?"     N/A  Protocols used: Leg Pain-A-AH

## 2022-03-15 NOTE — Telephone Encounter (Signed)
  Chief Complaint: Left leg pain for several weeks Symptoms: above Frequency: several weeks.   Not getting better Pertinent Negatives: Patient denies injuries but did have knee cap broken and repaired 6 yrs ago Disposition: '[]'$ ED /'[]'$ Urgent Care (no appt availability in office) / '[x]'$ Appointment(In office/virtual)/ '[]'$  [facility]/ '[]'$ Home Care/ '[]'$ Refused Recommended Disposition /'[]'$ [facility]/ '[]'$  Follow-up with PCP Additional Notes: Appt made with Dr. Wynetta Emery for 03/23/2022 at 1:40.

## 2022-03-17 ENCOUNTER — Ambulatory Visit: Payer: Medicare Other | Admitting: Radiation Oncology

## 2022-03-17 ENCOUNTER — Other Ambulatory Visit: Payer: Self-pay | Admitting: Family Medicine

## 2022-03-17 DIAGNOSIS — Z1231 Encounter for screening mammogram for malignant neoplasm of breast: Secondary | ICD-10-CM

## 2022-03-23 ENCOUNTER — Ambulatory Visit (INDEPENDENT_AMBULATORY_CARE_PROVIDER_SITE_OTHER): Payer: Medicare Other | Admitting: Family Medicine

## 2022-03-23 ENCOUNTER — Encounter: Payer: Self-pay | Admitting: Family Medicine

## 2022-03-23 VITALS — BP 124/70 | HR 65 | Temp 98.3°F | Wt 137.8 lb

## 2022-03-23 DIAGNOSIS — M79605 Pain in left leg: Secondary | ICD-10-CM

## 2022-03-23 DIAGNOSIS — L247 Irritant contact dermatitis due to plants, except food: Secondary | ICD-10-CM | POA: Diagnosis not present

## 2022-03-23 DIAGNOSIS — M7062 Trochanteric bursitis, left hip: Secondary | ICD-10-CM

## 2022-03-23 MED ORDER — TRIAMCINOLONE ACETONIDE 0.5 % EX OINT: 1.0000 | TOPICAL_OINTMENT | Freq: Two times a day (BID) | CUTANEOUS | 0 refills | Status: DC

## 2022-03-23 NOTE — Progress Notes (Signed)
BP 124/70   Pulse 65   Temp 98.3 F (36.8 C)   Wt 137 lb 12.8 oz (62.5 kg)   SpO2 98%   BMI 27.81 kg/m    Subjective:    Patient ID: Marie Parker, female    DOB: June 24, 1946, 75 y.o.   MRN: [id number]  HPI: Marie Parker is a 76 y.o. female  Chief Complaint  Patient presents with   Leg Pain    Patient states her left leg has been hurting for several months and is getting worse. Patient states pain starts in her left hip and goes down to her knee.    L LEG PAIN Duration: several months Mechanism of injury:  fell a few months ago Location: Left and low back Onset: sudden Severity: moderate Quality: aching Frequency: intermittent Radiation: L leg above the knee Aggravating factors: lifting, movement, and walking Alleviating factors: nothing Status: stable Treatments attempted: rest, ice, heat, APAP, ibuprofen, and aleve  Relief with NSAIDs?: no Nighttime pain:  yes Paresthesias / decreased sensation:  no Bowel / bladder incontinence:  no Fevers:  no Dysuria / urinary frequency:  no  RASH Duration:  days  Location: neck  Itching: yes Burning: yes Redness: yes Oozing: no Scaling: no Blisters: no Painful: no Fevers: no Change in detergents/soaps/personal care products: no Recent illness: no Recent travel:no History of same: yes Context: stable Alleviating factors: nothing Treatments attempted:lotion/moisturizer Shortness of breath: no  Throat/tongue swelling: no Myalgias/arthralgias: no  Relevant past medical, surgical, family and social history reviewed and updated as indicated. Interim medical history since our last visit reviewed. Allergies and medications reviewed and updated.  Review of Systems  Constitutional: Negative.   Respiratory: Negative.    Cardiovascular: Negative.   Gastrointestinal: Negative.   Psychiatric/Behavioral: Negative.      Per HPI unless specifically indicated above     Objective:    BP 124/70   Pulse 65    Temp 98.3 F (36.8 C)   Wt 137 lb 12.8 oz (62.5 kg)   SpO2 98%   BMI 27.81 kg/m   Wt Readings from Last 3 Encounters:  03/23/22 137 lb 12.8 oz (62.5 kg)  10/25/21 140 lb 6.4 oz (63.7 kg)  10/03/21 143 lb 3.2 oz (65 kg)    Physical Exam Vitals and nursing note reviewed.  Constitutional:      General: She is not in acute distress.    Appearance: Normal appearance. She is not ill-appearing, toxic-appearing or diaphoretic.  HENT:     Head: Normocephalic and atraumatic.     Right Ear: External ear normal.     Left Ear: External ear normal.     Nose: Nose normal.     Mouth/Throat:     Mouth: Mucous membranes are moist.     Pharynx: Oropharynx is clear.  Eyes:     General: No scleral icterus.       Right eye: No discharge.        Left eye: No discharge.     Extraocular Movements: Extraocular movements intact.     Conjunctiva/sclera: Conjunctivae normal.     Pupils: Pupils are equal, round, and reactive to light.  Cardiovascular:     Rate and Rhythm: Normal rate and regular rhythm.     Pulses: Normal pulses.     Heart sounds: Normal heart sounds. No murmur heard.    No friction rub. No gallop.  Pulmonary:     Effort: Pulmonary effort is normal. No respiratory distress.     Breath  sounds: Normal breath sounds. No stridor. No wheezing, rhonchi or rales.  Chest:     Chest wall: No tenderness.  Musculoskeletal:        General: Tenderness (over L trochanteric bursa) present. No swelling, deformity or signs of injury. Normal range of motion.     Cervical back: Normal range of motion and neck supple.     Right lower leg: No edema.     Left lower leg: No edema.  Skin:    General: Skin is warm and dry.     Capillary Refill: Capillary refill takes less than 2 seconds.     Coloration: Skin is not jaundiced or pale.     Findings: Rash (on L side of her neck) present. No bruising, erythema or lesion.  Neurological:     General: No focal deficit present.     Mental Status: She is alert  and oriented to person, place, and time. Mental status is at baseline.  Psychiatric:        Mood and Affect: Mood normal.        Behavior: Behavior normal.        Thought Content: Thought content normal.        Judgment: Judgment normal.     Results for orders placed or performed in visit on 10/25/21  Comprehensive metabolic panel  Result Value Ref Range   Glucose 99 70 - 99 mg/dL   BUN 11 8 - 27 mg/dL   Creatinine, Ser 0.86 0.57 - 1.00 mg/dL   eGFR 71 >59 mL/min/1.73   BUN/Creatinine Ratio 13 12 - 28   Sodium 143 134 - 144 mmol/L   Potassium 4.4 3.5 - 5.2 mmol/L   Chloride 103 96 - 106 mmol/L   CO2 25 20 - 29 mmol/L   Calcium 9.3 8.7 - 10.3 mg/dL   Total Protein 6.7 6.0 - 8.5 g/dL   Albumin 4.3 3.7 - 4.7 g/dL   Globulin, Total 2.4 1.5 - 4.5 g/dL   Albumin/Globulin Ratio 1.8 1.2 - 2.2   Bilirubin Total 0.2 0.0 - 1.2 mg/dL   Alkaline Phosphatase 101 44 - 121 IU/L   AST 21 0 - 40 IU/L   ALT 13 0 - 32 IU/L  CBC with Differential/Platelet  Result Value Ref Range   WBC 6.0 3.4 - 10.8 x10E3/uL   RBC 3.90 3.77 - 5.28 x10E6/uL   Hemoglobin 11.1 11.1 - 15.9 g/dL   Hematocrit 35.4 34.0 - 46.6 %   MCV 91 79 - 97 fL   MCH 28.5 26.6 - 33.0 pg   MCHC 31.4 (L) 31.5 - 35.7 g/dL   RDW 14.5 11.7 - 15.4 %   Platelets 332 150 - 450 x10E3/uL   Neutrophils 66 Not Estab. %   Lymphs 21 Not Estab. %   Monocytes 8 Not Estab. %   Eos 5 Not Estab. %   Basos 0 Not Estab. %   Neutrophils Absolute 4.0 1.4 - 7.0 x10E3/uL   Lymphocytes Absolute 1.3 0.7 - 3.1 x10E3/uL   Monocytes Absolute 0.5 0.1 - 0.9 x10E3/uL   EOS (ABSOLUTE) 0.3 0.0 - 0.4 x10E3/uL   Basophils Absolute 0.0 0.0 - 0.2 x10E3/uL   Immature Granulocytes 0 Not Estab. %   Immature Grans (Abs) 0.0 0.0 - 0.1 x10E3/uL  Lipid Panel w/o Chol/HDL Ratio  Result Value Ref Range   Cholesterol, Total 155 100 - 199 mg/dL   Triglycerides 134 0 - 149 mg/dL   HDL 50 >39 mg/dL   VLDL Cholesterol Cal  24 5 - 40 mg/dL   LDL Chol Calc (NIH) 81 0 -  99 mg/dL  Microalbumin, Urine Waived  Result Value Ref Range   Microalb, Ur Waived 150 (H) 0 - 19 mg/L   Creatinine, Urine Waived 200 10 - 300 mg/dL   Microalb/Creat Ratio 30-300 (H) <30 mg/g  Uric acid  Result Value Ref Range   Uric Acid 5.4 3.1 - 7.9 mg/dL      Assessment & Plan:   Problem List Items Addressed This Visit   None Visit Diagnoses     Left leg pain    -  Primary   Injected today as below with good results. If not better by next week, will go for x-rays.    Relevant Orders   DG Hip Unilat W OR W/O Pelvis 2-3 Views Left   DG Lumbar Spine Complete   DG FEMUR MIN 2 VIEWS LEFT   Trochanteric bursitis of left hip       Injected today as below with good results. If not better by next week, will go for x-rays.    Irritant contact dermatitis due to plants, except food       Will treat with triamcinalone ointment. Call if not getting better or getting worse.       Procedure: Left Trochanteric Bursitis Steroid Injection        Diagnosis:   ICD-10-CM   1. Left leg pain  M79.605 DG Hip Unilat W OR W/O Pelvis 2-3 Views Left    DG Lumbar Spine Complete    DG FEMUR MIN 2 VIEWS LEFT   Injected today as below with good results. If not better by next week, will go for x-rays.     2. Trochanteric bursitis of left hip  M70.62    Injected today as below with good results. If not better by next week, will go for x-rays.     3. Irritant contact dermatitis due to plants, except food  L24.7    Will treat with triamcinalone ointment. Call if not getting better or getting worse.       Physician: MJ Consent:  Risks, benefits, and alternative treatments discussed and all questions were answered.  Patient elected to proceed and verbal consent obtained.  Description: Area prepped and draped using  semi-sterile technique.  Using a lateral approach, a mixture of 3cc of 0.5% marcaine & 1cc of Kenalog 40 was injected into the point of maximal tenderness.  A bandage was then placed over the  injection site. Complications: none Post Procedure Instructions: Wound care instructions discussed and patient was instructed to keep area clean and dry.  Signs and symptoms of infection discussed, patient agrees to contact the office ASAP should they occur.  Follow Up: Return as scheduled.   Follow up plan: Return as scheduled.

## 2022-03-24 ENCOUNTER — Other Ambulatory Visit: Payer: Self-pay | Admitting: General Surgery

## 2022-03-24 ENCOUNTER — Ambulatory Visit
Admission: RE | Admit: 2022-03-24 | Discharge: 2022-03-24 | Disposition: A | Discharge status: Home / Self Care | Payer: Medicare Other | Source: Ambulatory Visit | Attending: Radiation Oncology | Admitting: Radiation Oncology

## 2022-03-24 ENCOUNTER — Encounter: Payer: Self-pay | Admitting: Radiation Oncology

## 2022-03-24 VITALS — BP 147/83 | HR 65 | Temp 98.2°F | Resp 20 | Ht 59.0 in | Wt 136.3 lb

## 2022-03-24 DIAGNOSIS — C50812 Malignant neoplasm of overlapping sites of left female breast: Secondary | ICD-10-CM

## 2022-03-24 DIAGNOSIS — Z79811 Long term (current) use of aromatase inhibitors: Secondary | ICD-10-CM | POA: Diagnosis not present

## 2022-03-24 DIAGNOSIS — Z170 Estrogen receptor positive status [ER+]: Secondary | ICD-10-CM | POA: Insufficient documentation

## 2022-03-24 DIAGNOSIS — Z853 Personal history of malignant neoplasm of breast: Secondary | ICD-10-CM | POA: Diagnosis not present

## 2022-03-24 DIAGNOSIS — Z08 Encounter for follow-up examination after completed treatment for malignant neoplasm: Secondary | ICD-10-CM | POA: Diagnosis not present

## 2022-03-24 DIAGNOSIS — Z923 Personal history of irradiation: Secondary | ICD-10-CM | POA: Diagnosis not present

## 2022-03-24 DIAGNOSIS — Z1231 Encounter for screening mammogram for malignant neoplasm of breast: Secondary | ICD-10-CM

## 2022-03-24 NOTE — Progress Notes (Signed)
Radiation Oncology Follow up Note  Name: Marie Parker   Date:   03/24/2022 MRN:  [id number] DOB: 1946/02/14    This 76 y.o. female presents to the clinic today for 4-year follow-up status post accelerated partial breast radiation to her left breast for stage I ER/PR positive invasive mammary carcinoma.  REFERRING PROVIDER: Valerie Roys, DO  HPI: Patient is a.  76 year old female now out over 4 years having completed accelerated partial breast radiation to her left breast for stage I ER/PR positive invasive mammary carcinoma.  Seen today in routine follow-up she is doing well.  She specifically denies breast tenderness cough or bone pain.  She still has some what she describes as increased sensation in the left breast not sure the etiology of that.  Her last mammograms were back in August which I have reviewed were BI-RADS 2 benign.  She is scheduled for follow-up mammograms in next month which I will review when they become available.  She is currently on Femara tolerating it well without side effect.  COMPLICATIONS OF TREATMENT: none  FOLLOW UP COMPLIANCE: keeps appointments   PHYSICAL EXAM: Patient has a rash on her left neck and in the parasternal region which she is being treated for may be poison ivy. BP (!) 147/83 (BP Location: Right Arm, Patient Position: Sitting, Cuff Size: Normal)   Pulse 65   Temp 98.2 F (36.8 C) (Tympanic)   Resp 20   Ht '4\' 11"'$  (1.499 m) Comment: stated Ht.  Wt 136 lb 4.8 oz (61.8 kg)   BMI 27.53 kg/m  Lungs are clear to A&P cardiac examination essentially unremarkable with regular rate and rhythm. No dominant mass or nodularity is noted in either breast in 2 positions examined. Incision is well-healed. No axillary or supraclavicular adenopathy is appreciated. Cosmetic result is excellent.  Well-developed well-nourished patient in NAD. HEENT reveals PERLA, EOMI, discs not visualized.  Oral cavity is clear. No oral mucosal lesions are identified. Neck is  clear without evidence of cervical or supraclavicular adenopathy. Lungs are clear to A&P. Cardiac examination is essentially unremarkable with regular rate and rhythm without murmur rub or thrill. Abdomen is benign with no organomegaly or masses noted. Motor sensory and DTR levels are equal and symmetric in the upper and lower extremities. Cranial nerves II through XII are grossly intact. Proprioception is intact. No peripheral adenopathy or edema is identified. No motor or sensory levels are noted. Crude visual fields are within normal range.  RADIOLOGY RESULTS: Mammograms reviewed  PLAN: Present time patient is doing well now at over 4 years with no evidence of disease.  This time I am going to discontinue follow-up care.  She continues follow-up care with her GM P.  Patient is to call with any concerns at any time.  I would like to take this opportunity to thank you for allowing me to participate in the care of your patient.Noreene Filbert, MD

## 2022-04-10 ENCOUNTER — Ambulatory Visit (INDEPENDENT_AMBULATORY_CARE_PROVIDER_SITE_OTHER): Payer: Medicare Other | Admitting: *Deleted

## 2022-04-10 DIAGNOSIS — Z0000 Encounter for general adult medical examination without abnormal findings: Secondary | ICD-10-CM | POA: Diagnosis not present

## 2022-04-10 NOTE — Patient Instructions (Signed)
Marie Parker , Thank you for taking time to come for your Medicare Wellness Visit. I appreciate your ongoing commitment to your health goals. Please review the following plan we discussed and let me know if I can assist you in the future.   Screening recommendations/referrals: Colonoscopy: Education provided Mammogram: Education provided Bone Density: Education provided Recommended yearly ophthalmology/optometry visit for glaucoma screening and checkup Recommended yearly dental visit for hygiene and checkup  Vaccinations: Influenza vaccine: up to date Pneumococcal vaccine: up to date Tdap vaccine: up to date Shingles vaccine: Education provided    Advanced directives: education provided  Conditions/risks identified:   Next appointment: 04-24-2022 @ 9:00 [facility] 65 Years and Older, Female Preventive care refers to lifestyle choices and visits with your health care provider that can promote health and wellness. What does preventive care include? A yearly physical exam. This is also called an annual well check. Dental exams once or twice a year. Routine eye exams. Ask your health care provider how often you should have your eyes checked. Personal lifestyle choices, including: Daily care of your teeth and gums. Regular physical activity. Eating a healthy diet. Avoiding tobacco and drug use. Limiting alcohol use. Practicing safe sex. Taking low-dose aspirin every day. Taking vitamin and mineral supplements as recommended by your health care provider. What happens during an annual well check? The services and screenings done by your health care provider during your annual well check will depend on your age, overall health, lifestyle risk factors, and family history of disease. Counseling  Your health care provider may ask you questions about your: Alcohol use. Tobacco use. Drug use. Emotional well-being. Home and relationship well-being. Sexual  activity. Eating habits. History of falls. Memory and ability to understand (cognition). Work and work Statistician. Reproductive health. Screening  You may have the following tests or measurements: Height, weight, and BMI. Blood pressure. Lipid and cholesterol levels. These may be checked every 5 years, or more frequently if you are over 80 years old. Skin check. Lung cancer screening. You may have this screening every year starting at age 104 if you have a 30-pack-year history of smoking and currently smoke or have quit within the past 15 years. Fecal occult blood test (FOBT) of the stool. You may have this test every year starting at age 53. Flexible sigmoidoscopy or colonoscopy. You may have a sigmoidoscopy every 5 years or a colonoscopy every 10 years starting at age 63. Hepatitis C blood test. Hepatitis B blood test. Sexually transmitted disease (STD) testing. Diabetes screening. This is done by checking your blood sugar (glucose) after you have not eaten for a while (fasting). You may have this done every 1-3 years. Bone density scan. This is done to screen for osteoporosis. You may have this done starting at age 62. Mammogram. This may be done every 1-2 years. Talk to your health care provider about how often you should have regular mammograms. Talk with your health care provider about your test results, treatment options, and if necessary, the need for more tests. Vaccines  Your health care provider may recommend certain vaccines, such as: Influenza vaccine. This is recommended every year. Tetanus, diphtheria, and acellular pertussis (Tdap, Td) vaccine. You may need a Td booster every 10 years. Zoster vaccine. You may need this after age 45. Pneumococcal 13-valent conjugate (PCV13) vaccine. One dose is recommended after age 57. Pneumococcal polysaccharide (PPSV23) vaccine. One dose is recommended after age 38. Talk to your health care provider about which screenings  and vaccines  you need and how often you need them. This information is not intended to replace advice given to you by your health care provider. Make sure you discuss any questions you have with your health care provider. Document Released: 09/10/2015 Document Revised: 05/03/2016 Document Reviewed: 06/15/2015 Elsevier Interactive Patient Education  2017 [employer] Prevention in the Home Falls can cause injuries. They can happen to people of all ages. There are many things you can do to make your home safe and to help prevent falls. What can I do on the outside of my home? Regularly fix the edges of walkways and driveways and fix any cracks. Remove anything that might make you trip as you walk through a door, such as a raised step or threshold. Trim any bushes or trees on the path to your home. Use bright outdoor lighting. Clear any walking paths of anything that might make someone trip, such as rocks or tools. Regularly check to see if handrails are loose or broken. Make sure that both sides of any steps have handrails. Any raised decks and porches should have guardrails on the edges. Have any leaves, snow, or ice cleared regularly. Use sand or salt on walking paths during winter. Clean up any spills in your garage right away. This includes oil or grease spills. What can I do in the bathroom? Use night lights. Install grab bars by the toilet and in the tub and shower. Do not use towel bars as grab bars. Use non-skid mats or decals in the tub or shower. If you need to sit down in the shower, use a plastic, non-slip stool. Keep the floor dry. Clean up any water that spills on the floor as soon as it happens. Remove soap buildup in the tub or shower regularly. Attach bath mats securely with double-sided non-slip rug tape. Do not have throw rugs and other things on the floor that can make you trip. What can I do in the bedroom? Use night lights. Make sure that you have a light by your bed that  is easy to reach. Do not use any sheets or blankets that are too big for your bed. They should not hang down onto the floor. Have a firm chair that has side arms. You can use this for support while you get dressed. Do not have throw rugs and other things on the floor that can make you trip. What can I do in the kitchen? Clean up any spills right away. Avoid walking on wet floors. Keep items that you use a lot in easy-to-reach places. If you need to reach something above you, use a strong step stool that has a grab bar. Keep electrical cords out of the way. Do not use floor polish or wax that makes floors slippery. If you must use wax, use non-skid floor wax. Do not have throw rugs and other things on the floor that can make you trip. What can I do with my stairs? Do not leave any items on the stairs. Make sure that there are handrails on both sides of the stairs and use them. Fix handrails that are broken or loose. Make sure that handrails are as long as the stairways. Check any carpeting to make sure that it is firmly attached to the stairs. Fix any carpet that is loose or worn. Avoid having throw rugs at the top or bottom of the stairs. If you do have throw rugs, attach them to the floor with carpet tape.  Make sure that you have a light switch at the top of the stairs and the bottom of the stairs. If you do not have them, ask someone to add them for you. What else can I do to help prevent falls? Wear shoes that: Do not have high heels. Have rubber bottoms. Are comfortable and fit you well. Are closed at the toe. Do not wear sandals. If you use a stepladder: Make sure that it is fully opened. Do not climb a closed stepladder. Make sure that both sides of the stepladder are locked into place. Ask someone to hold it for you, if possible. Clearly mark and make sure that you can see: Any grab bars or handrails. First and last steps. Where the edge of each step is. Use tools that help you  move around (mobility aids) if they are needed. These include: Canes. Walkers. Scooters. Crutches. Turn on the lights when you go into a dark area. Replace any light bulbs as soon as they burn out. Set up your furniture so you have a clear path. Avoid moving your furniture around. If any of your floors are uneven, fix them. If there are any pets around you, be aware of where they are. Review your medicines with your doctor. Some medicines can make you feel dizzy. This can increase your chance of falling. Ask your doctor what other things that you can do to help prevent falls. This information is not intended to replace advice given to you by your health care provider. Make sure you discuss any questions you have with your health care provider. Document Released: 06/10/2009 Document Revised: 01/20/2016 Document Reviewed: 09/18/2014 Elsevier Interactive Patient Education  2017 [employer].

## 2022-04-10 NOTE — Progress Notes (Signed)
Subjective:   Marie Parker is a 76 y.o. female who presents for Medicare Annual (Subsequent) preventive examination.  I connected with  Virgie Kunda on 04/10/22 by a telephone enabled telemedicine application and verified that I am speaking with the correct person using two identifiers.   I discussed the limitations of evaluation and management by telemedicine. The patient expressed understanding and agreed to proceed.  Patient location: home  Provider location: Tele-health-home    Review of Systems           Objective:    There were no vitals filed for this visit. There is no height or weight on file to calculate BMI.     03/24/2022    9:27 AM 04/08/2021   11:18 AM 06/11/2020    8:47 AM 06/08/2020   11:43 AM 04/05/2020   11:19 AM 02/09/2020    7:40 AM 01/30/2019    9:08 AM  Advanced Directives  Does Patient Have a Medical Advance Directive? Yes Yes Yes Yes Yes Yes No  Type of Advance Directive Living will Living will Living will Living will Living will Living will   Does patient want to make changes to medical advance directive?   No - Patient declined No - Patient declined     Would patient like information on creating a medical advance directive?       No - Patient declined    Current Medications (verified) Outpatient Encounter Medications as of 04/10/2022  Medication Sig   acetaminophen (TYLENOL) 650 MG CR tablet Take 1,300 mg by mouth daily as needed for pain.    amitriptyline (ELAVIL) 25 MG tablet TAKE 1 TABLET BY MOUTH AT  BEDTIME   amLODipine (NORVASC) 10 MG tablet TAKE 1 TABLET BY MOUTH DAILY   aspirin 81 MG tablet Take 1 tablet (81 mg total) by mouth daily. (Patient taking differently: Take 81 mg by mouth at bedtime.)   atenolol (TENORMIN) 50 MG tablet TAKE 1 TABLET BY MOUTH DAILY   atorvastatin (LIPITOR) 40 MG tablet TAKE 1 TABLET BY MOUTH AT  BEDTIME   benazepril (LOTENSIN) 40 MG tablet TAKE 1 TABLET BY MOUTH DAILY   CALCIUM-VITAMIN D PO Take 2 tablets  by mouth daily.   clobetasol ointment (TEMOVATE) 0.05 % Daily at night for 6-12 weeks, if improvement, decrease to 2-3x per week   clonazePAM (KLONOPIN) 0.5 MG tablet Take 0.5-1 tablets (0.25-0.5 mg total) by mouth 2 (two) times daily as needed for anxiety.   fluticasone (FLONASE) 50 MCG/ACT nasal spray Place 2 sprays into both nostrils daily.   hydrALAZINE (APRESOLINE) 25 MG tablet TAKE 1 TABLET BY MOUTH TWICE  DAILY   HYDROcodone-acetaminophen (NORCO) 5-325 MG tablet Take 1 tablet by mouth every 6 (six) hours as needed for moderate pain.   letrozole (FEMARA) 2.5 MG tablet TAKE 1 TABLET(2.5 MG) BY MOUTH DAILY (Patient taking differently: Take 2.5 mg by mouth daily.)   Multiple Vitamin (MULTIVITAMIN WITH MINERALS) TABS tablet Take 1 tablet by mouth daily.   niacin 500 MG tablet Take 1,000 mg by mouth at bedtime.   omeprazole (PRILOSEC) 40 MG capsule TAKE 1 CAPSULE BY MOUTH  TWICE DAILY BEFORE MEALS   Probiotic Product (PROBIOTIC-10 PO) Take by mouth daily.    sertraline (ZOLOFT) 100 MG tablet Take 1.5 tablets (150 mg total) by mouth daily.   tiZANidine (ZANAFLEX) 2 MG tablet TAKE 1 TABLET(2 MG) BY MOUTH EVERY 6 HOURS AS NEEDED FOR MUSCLE SPASMS   triamcinolone ointment (KENALOG) 0.5 % Apply 1 Application topically 2 (  two) times daily.   Turmeric 500 MG TABS Take 500 mg by mouth daily.    No facility-administered encounter medications on file as of 04/10/2022.    Allergies (verified) Alendronate, Amoxicillin-pot clavulanate, Crestor [rosuvastatin], Cyclobenzaprine, Erythromycin, and Fosamax [alendronate sodium]   History: Past Medical History:  Diagnosis Date   Anxiety    Arthritis    Breast cancer ([place])    Breast cancer of upper-outer quadrant of left female breast ([place]) 04/22/2018   16 mm, T1c, N0.  ER/PR positive, HER-2/neu not overexpressed.  Low MammaPrint score.     CAD (coronary artery disease)    Cancer (HCC)    squamous cell   Cataract    Cholecystitis 04/10/2018   Chronic  kidney disease    Colon cancer screening 2017?   negative cologuard   Depression    Family history of adverse reaction to anesthesia    mother had N/V   Generalized osteoarthritis    GERD (gastroesophageal reflux disease)    Gout    History of endoscopy 11/2012   Gastritis   Hyperlipidemia    Hypertension    IFG (impaired fasting glucose)    Insomnia    Menopause    Morton's neuroma of right foot 11/28/2019   Osteoporosis    Parathyroid adenoma    Personal history of radiation therapy 2020   Left breast   Personal history of tobacco use, presenting hazards to health 02/02/2016   PVD (peripheral vascular disease) ([place])    PVD (peripheral vascular disease) ([place])    Rotator cuff syndrome of shoulder and allied disorders    Tobacco abuse    Past Surgical History:  Procedure Laterality Date   AIKEN OSTEOTOMY Right 06/11/2020   Procedure: DOUBLE OSTEOTOMY RIGHT;  Surgeon: Samara Deist, DPM;  Location: [facility] ORS;  Service: Podiatry;  Laterality: Right;   BREAST BIOPSY Left 2014   COMPLEX FIBROADENOMA   BREAST BIOPSY Left 04/05/2018   [facility]   BREAST EXCISIONAL BIOPSY Left 1970's   neg   BREAST FIBROADENOMA SURGERY     BREAST LUMPECTOMY     BREAST MAMMOSITE Left 04/2018   CARDIAC CATHETERIZATION  09/17/2007   Insignificant CAD with 40-50% stenosis of mid LAD.    CATARACT EXTRACTION, BILATERAL  12/2020   COLONOSCOPY WITH PROPOFOL N/A 02/09/2020   Procedure: COLONOSCOPY WITH PROPOFOL;  Surgeon: Lin Landsman, MD;  Location: [facility] ENDOSCOPY;  Service: Gastroenterology;  Laterality: N/A;   ESOPHAGOGASTRODUODENOSCOPY (EGD) WITH PROPOFOL N/A 02/09/2020   Procedure: ESOPHAGOGASTRODUODENOSCOPY (EGD) WITH PROPOFOL;  Surgeon: Lin Landsman, MD;  Location[facility] END[facility];  Service: Gastroenterology;  Laterality: N/A;   EXCISION MORTON'S NEUROMA Right 06/11/2020   Procedure: EXCISION MORTON'S NEUROMA;  Surgeon: Samara Deist, DPM;  Location: [facility] ORS;  Service: Podiatry;   Laterality: Right;   HAMMER TOE SURGERY Right 06/11/2020   Procedure: HAMMER TOE CORRECTION T6;  Surgeon: Samara Deist, DPM;  Location: [facility] ORS;  Service: Podiatry;  Laterality: Right;   ILIAC ARTERY STENT     right iliac artery stent    MASTECTOMY, PARTIAL Left 04/22/2018   Procedure: MASTECTOMY PARTIAL;  Surgeon: Robert Bellow, MD;  Location: [facility] ORS;  Service: General;  Laterality: Left;   ORIF PATELLA Left 03/22/2016   Dr. Tristan Schroeder   PARATHYROIDECTOMY  2012   [facility]   SENTINEL NODE BIOPSY Left 04/22/2018   Procedure: SENTINEL NODE BIOPSY;  Surgeon: Robert Bellow, MD;  Location: [facility] ORS;  Service: General;  Laterality: Left;   SKIN BIOPSY  precancerous   Family History  Problem Relation Age of Onset   Stroke Mother    Hypertension Mother    Diabetes Mother    Heart attack Father 74   Alcohol abuse Father    Heart disease Father    Liver disease Father    Hypertension Father    Heart disease Sister 80       CAD; s/p stent placement   Stroke Sister    AAA (abdominal aortic aneurysm) Sister    Dementia Sister    Breast cancer Maternal Aunt 31   Bone cancer Paternal Uncle    Stroke Maternal Grandmother    Heart attack Maternal Grandfather    Stroke Paternal Grandmother    Breast cancer Cousin        mat cousin   Social History   Socioeconomic History   Marital status: Married    Spouse name: Not on file   Number of children: Not on file   Years of education: Not on file   Highest education level: High school graduate  Occupational History   Occupation: part time [employer]   Tobacco Use   Smoking status: Former    Packs/day: 1.00    Years: 42.00    Total pack years: 42.00    Types: Cigarettes    Quit date: 08/28/2000    Years since quitting: 21.6   Smokeless tobacco: Never  Vaping Use   Vaping Use: Never used  Substance and Sexual Activity   Alcohol use: No   Drug use: No   Sexual activity: Not Currently  Other Topics Concern   Not on  file  Social History Narrative   Working part time   Psychologist, occupational at the hospital on Mondays    Social Determinants of Health   Financial Resource Strain: Low Risk  (04/08/2021)   Overall Financial Resource Strain (CARDIA)    Difficulty of Paying Living Expenses: Not hard at all  Food Insecurity: No Food Insecurity (04/08/2021)   Hunger Vital Sign    Worried About Running Out of Food in the Last Year: Never true    [place] in the Last Year: Never true  Transportation Needs: No Transportation Needs (04/08/2021)   PRAPARE - Hydrologist (Medical): No    Lack of Transportation (Non-Medical): No  Physical Activity: Inactive (04/08/2021)   Exercise Vital Sign    Days of Exercise per Week: 0 days    Minutes of Exercise per Session: 0 min  Stress: No Stress Concern Present (04/08/2021)   [place]    Feeling of Stress : Only a little  Social Connections: Socially Integrated (04/05/2021)   Social Connection and Isolation Panel [NHANES]    Frequency of Communication with Friends and Family: More than three times a week    Frequency of Social Gatherings with Friends and Family: More than three times a week    Attends Religious Services: More than 4 times per year    Active Member of [employer] or Organizations: Yes    Attends Music therapist: More than 4 times per year    Marital Status: Married    Tobacco Counseling Counseling given: Not Answered   Clinical Intake:                 Diabetic?  no         Activities of Daily Living     No data to display  Patient Care Team: Valerie Roys, DO as PCP - General (Family Medicine) Wellington Hampshire, MD as PCP - Cardiology (Cardiology) Ree Edman, MD (Dermatology) Dagoberto Ligas, MD as Referring Physician (Internal Medicine) Bary Castilla Forest Gleason, MD (General Surgery) Noreene Filbert, MD as  Referring Physician (Radiation Oncology)  Indicate any recent Medical Services you may have received from other than [facility] providers in the past year (date may be approximate).     Assessment:   This is a routine wellness examination for Anaya.  Hearing/Vision screen No results found.  Dietary issues and exercise activities discussed:     Goals Addressed   None    Depression Screen    10/25/2021    8:22 AM 10/03/2021   11:33 AM 04/26/2021    8:09 AM 04/08/2021   11:19 AM 04/05/2021    9:37 AM 04/22/2020    7:59 PM 04/05/2020   11:20 AM  PHQ 2/9 Scores  PHQ - 2 Score 0 0 0 0 0 0 0  PHQ- 9 Score 2 3 4    0 0    Fall Risk    10/25/2021    8:22 AM 04/26/2021    8:08 AM 04/08/2021   11:18 AM 04/22/2020    7:59 PM 04/22/2020    8:28 AM  Fall Risk   Falls in the past year? 1 0 0 0 0  Number falls in past yr: 0 0   0  Injury with Fall? 1 0   0  Risk for fall due to : History of fall(s) No Fall Risks Medication side effect    Follow up Falls evaluation completed Falls evaluation completed Falls evaluation completed;Education provided;Falls prevention discussed      FALL RISK PREVENTION PERTAINING TO THE HOME:  Any stairs in or around the home? Yes  If so, are there any without handrails? No  Home free of loose throw rugs in walkways, pet beds, electrical cords, etc? Yes  Adequate lighting in your home to reduce risk of falls? Yes   ASSISTIVE DEVICES UTILIZED TO PREVENT FALLS:  Life alert? No  Use of a cane, walker or w/c? No  Grab bars in the bathroom? Yes  Shower chair or bench in shower? Yes  Elevated toilet seat or a handicapped toilet? No   TIMED UP AND GO:  Was the test performed? No .    Cognitive Function:        04/08/2021   11:21 AM 04/05/2020   11:23 AM 01/01/2019    3:29 PM 12/24/2017    8:36 AM 12/21/2016    8:34 AM  6CIT Screen  What Year? 0 points 0 points 0 points 0 points 0 points  What month? 0 points 0 points 0 points 0 points 0 points  What time? 0  points 0 points 0 points 0 points 0 points  Count back from 20 0 points 0 points 0 points 0 points 0 points  Months in reverse 0 points 0 points 0 points 0 points 2 points  Repeat phrase 0 points 4 points 0 points 0 points 4 points  Total Score 0 points 4 points 0 points 0 points 6 points    Immunizations Immunization History  Administered Date(s) Administered   Fluad Quad(high Dose 65+) 04/24/2019   Influenza, High Dose Seasonal PF 06/21/2016, 05/24/2018, 06/07/2020   Influenza,inj,Quad PF,6+ Mos 06/22/2015   Influenza-Unspecified 06/02/2014, 05/07/2017, 06/23/2021   Moderna Sars-Covid-2 Vaccination 08/08/2021   PFIZER(Purple Top)SARS-COV-2 Vaccination 10/24/2019, 11/14/2019, 05/31/2020   Pneumococcal Conjugate-13 09/01/2014  Pneumococcal Polysaccharide-23 12/11/2014   Td 04/10/2005, 12/21/2015   Tdap 04/30/2017   Zoster, Live 08/11/2008    TDAP status: Up to date  Flu Vaccine status: Up to date  Pneumococcal vaccine status: Up to date  Covid-19 vaccine status: Information provided on how to obtain vaccines.   Qualifies for Shingles Vaccine? No   Zostavax completed Yes   Shingrix Completed?: No.    Education has been provided regarding the importance of this vaccine. Patient has been advised to call insurance company to determine out of pocket expense if they have not yet received this vaccine. Advised may also receive vaccine at local pharmacy or Health Dept. Verbalized acceptance and understanding.  Screening Tests Health Maintenance  Topic Date Due   Zoster Vaccines- Shingrix (1 of 2) Never done   COVID-19 Vaccine (5 - Pfizer risk series) 10/03/2021   INFLUENZA VACCINE  03/28/2022   MAMMOGRAM  04/20/2023   COLONOSCOPY (Pts 45-83yr Insurance coverage will need to be confirmed)  02/08/2025   TETANUS/TDAP  05/01/2027   Pneumonia Vaccine 75 Years old  Completed   DEXA SCAN  Completed   Hepatitis C Screening  Addressed   HPV VACCINES  Aged Out   Fecal DNA (Cologuard)   Discontinued    Health Maintenance  Health Maintenance Due  Topic Date Due   Zoster Vaccines- Shingrix (1 of 2) Never done   COVID-19 Vaccine (5 - Pfizer risk series) 10/03/2021   INFLUENZA VACCINE  03/28/2022    Colorectal cancer screening: Type of screening: Colonoscopy. Completed 2021. Repeat every 5 years  Mammogram scheduled  Bone Density status: Completed 2022. Results reflect: Bone density results: OSTEOPOROSIS. Repeat every 2 years.  Lung Cancer Screening: (Low Dose CT Chest recommended if Age 75-80years, 30 pack-year currently smoking OR have quit w/in 15years.) does not qualify.   Lung Cancer Screening Referral:   Additional Screening:  Hepatitis C Screening: does not qualify; Completed 2016  Vision Screening: Recommended annual ophthalmology exams for early detection of glaucoma and other disorders of the eye. Is the patient up to date with their annual eye exam?  Yes  Who is the provider or what is the name of the office in which the patient attends annual eye exams? [facility] If pt is not established with a provider, would they like to be referred to a provider to establish care? No .   Dental Screening: Recommended annual dental exams for proper oral hygiene  Community Resource Referral / Chronic Care Management: CRR required this visit?  No   CCM required this visit?  No      Plan:     I have personally reviewed and noted the following in the patient's chart:   Medical and social history Use of alcohol, tobacco or illicit drugs  Current medications and supplements including opioid prescriptions.  Functional ability and status Nutritional status Physical activity Advanced directives List of other physicians Hospitalizations, surgeries, and ER visits in previous 12 months Vitals Screenings to include cognitive, depression, and falls Referrals and appointments  In addition, I have reviewed and discussed with patient certain preventive protocols,  quality metrics, and best practice recommendations. A written personalized care plan for preventive services as well as general preventive health recommendations were provided to patient.     JLeroy Kennedy LPN   [id number]  Nurse Notes:

## 2022-04-20 ENCOUNTER — Ambulatory Visit
Admission: RE | Admit: 2022-04-20 | Discharge: 2022-04-20 | Disposition: A | Discharge status: Home / Self Care | Payer: Medicare Other | Source: Ambulatory Visit | Attending: Family Medicine | Admitting: Family Medicine

## 2022-04-20 DIAGNOSIS — Z1231 Encounter for screening mammogram for malignant neoplasm of breast: Secondary | ICD-10-CM | POA: Insufficient documentation

## 2022-04-24 ENCOUNTER — Encounter: Payer: Medicare Other | Admitting: Family Medicine

## 2022-04-27 DIAGNOSIS — Z170 Estrogen receptor positive status [ER+]: Secondary | ICD-10-CM | POA: Diagnosis not present

## 2022-04-27 DIAGNOSIS — C50412 Malignant neoplasm of upper-outer quadrant of left female breast: Secondary | ICD-10-CM | POA: Diagnosis not present

## 2022-05-05 ENCOUNTER — Encounter: Payer: Self-pay | Admitting: Family Medicine

## 2022-05-05 ENCOUNTER — Ambulatory Visit (INDEPENDENT_AMBULATORY_CARE_PROVIDER_SITE_OTHER): Payer: Medicare Other | Admitting: Family Medicine

## 2022-05-05 VITALS — BP 125/81 | HR 67 | Temp 98.3°F | Ht 59.0 in | Wt 136.0 lb

## 2022-05-05 DIAGNOSIS — C50812 Malignant neoplasm of overlapping sites of left female breast: Secondary | ICD-10-CM | POA: Diagnosis not present

## 2022-05-05 DIAGNOSIS — I129 Hypertensive chronic kidney disease with stage 1 through stage 4 chronic kidney disease, or unspecified chronic kidney disease: Secondary | ICD-10-CM | POA: Diagnosis not present

## 2022-05-05 DIAGNOSIS — E782 Mixed hyperlipidemia: Secondary | ICD-10-CM

## 2022-05-05 DIAGNOSIS — Z0000 Encounter for general adult medical examination without abnormal findings: Secondary | ICD-10-CM | POA: Diagnosis not present

## 2022-05-05 DIAGNOSIS — D692 Other nonthrombocytopenic purpura: Secondary | ICD-10-CM

## 2022-05-05 DIAGNOSIS — K219 Gastro-esophageal reflux disease without esophagitis: Secondary | ICD-10-CM

## 2022-05-05 DIAGNOSIS — F411 Generalized anxiety disorder: Secondary | ICD-10-CM

## 2022-05-05 DIAGNOSIS — I700 Atherosclerosis of aorta: Secondary | ICD-10-CM | POA: Diagnosis not present

## 2022-05-05 DIAGNOSIS — M1A9XX0 Chronic gout, unspecified, without tophus (tophi): Secondary | ICD-10-CM

## 2022-05-05 LAB — MICROALBUMIN, URINE WAIVED
Creatinine, Urine Waived: 100 mg/dL (ref 10–300)
Microalb, Ur Waived: 80 mg/L — ABNORMAL HIGH (ref 0–19)

## 2022-05-05 LAB — URINALYSIS, ROUTINE W REFLEX MICROSCOPIC
Bilirubin, UA: NEGATIVE
Glucose, UA: NEGATIVE
Ketones, UA: NEGATIVE
Leukocytes,UA: NEGATIVE
Nitrite, UA: NEGATIVE
RBC, UA: NEGATIVE
Specific Gravity, UA: 1.015 (ref 1.005–1.030)
Urobilinogen, Ur: 0.2 mg/dL (ref 0.2–1.0)
pH, UA: 6 (ref 5.0–7.5)

## 2022-05-05 MED ORDER — OMEPRAZOLE 40 MG PO CPDR: DELAYED_RELEASE_CAPSULE | ORAL | 3 refills | Status: DC

## 2022-05-05 MED ORDER — AMLODIPINE BESYLATE 10 MG PO TABS: 10.0000 mg | ORAL_TABLET | Freq: Every day | ORAL | 1 refills | Status: DC

## 2022-05-05 MED ORDER — BENAZEPRIL HCL 40 MG PO TABS: 40.0000 mg | ORAL_TABLET | Freq: Every day | ORAL | 1 refills | Status: DC

## 2022-05-05 MED ORDER — HYDRALAZINE HCL 25 MG PO TABS: 25.0000 mg | ORAL_TABLET | Freq: Two times a day (BID) | ORAL | 1 refills | Status: DC

## 2022-05-05 MED ORDER — SERTRALINE HCL 100 MG PO TABS: 150.0000 mg | ORAL_TABLET | Freq: Every day | ORAL | 1 refills | Status: DC

## 2022-05-05 MED ORDER — AMITRIPTYLINE HCL 25 MG PO TABS: 25.0000 mg | ORAL_TABLET | Freq: Every day | ORAL | 1 refills | Status: DC

## 2022-05-05 MED ORDER — TIZANIDINE HCL 2 MG PO TABS
ORAL_TABLET | ORAL | 0 refills | Status: DC
Start: 2022-05-05 — End: 2022-11-03

## 2022-05-05 MED ORDER — ATENOLOL 50 MG PO TABS: 50.0000 mg | ORAL_TABLET | Freq: Every day | ORAL | 1 refills | Status: DC

## 2022-05-05 MED ORDER — ATORVASTATIN CALCIUM 40 MG PO TABS: 40.0000 mg | ORAL_TABLET | Freq: Every day | ORAL | 1 refills | Status: DC

## 2022-05-05 NOTE — Assessment & Plan Note (Signed)
On femara. Continue to follow with surgery and oncology. Call with any concerns.

## 2022-05-05 NOTE — Assessment & Plan Note (Signed)
Under good control on current regimen. Continue current regimen. Continue to monitor. Call with any concerns. Refills given. Labs drawn today.   

## 2022-05-05 NOTE — Progress Notes (Deleted)
BP 125/81   Pulse 67   Temp 98.3 F (36.8 C)   Ht 4' 11"  (1.499 m)   Wt 136 lb (61.7 kg)   SpO2 97%   BMI 27.47 kg/m    Subjective:    Patient ID: Marie Parker, female    DOB: [birth year], 76 y.o.   MRN: [id number]  HPI: Marie Parker is a 76 y.o. female  Chief Complaint  Patient presents with   Anxiety   Hyperlipidemia   Gastroesophageal Reflux   Hypertension   Coronary Artery Disease    Relevant past medical, surgical, family and social history reviewed and updated as indicated. Interim medical history since our last visit reviewed. Allergies and medications reviewed and updated.  Review of Systems  Per HPI unless specifically indicated above     Objective:    BP 125/81   Pulse 67   Temp 98.3 F (36.8 C)   Ht 4' 11"  (1.499 m)   Wt 136 lb (61.7 kg)   SpO2 97%   BMI 27.47 kg/m   Wt Readings from Last 3 Encounters:  05/05/22 136 lb (61.7 kg)  03/24/22 136 lb 4.8 oz (61.8 kg)  03/23/22 137 lb 12.8 oz (62.5 kg)    Physical Exam  Results for orders placed or performed in visit on 10/25/21  Comprehensive metabolic panel  Result Value Ref Range   Glucose 99 70 - 99 mg/dL   BUN 11 8 - 27 mg/dL   Creatinine, Ser 0.86 0.57 - 1.00 mg/dL   eGFR 71 >59 mL/min/1.73   BUN/Creatinine Ratio 13 12 - 28   Sodium 143 134 - 144 mmol/L   Potassium 4.4 3.5 - 5.2 mmol/L   Chloride 103 96 - 106 mmol/L   CO2 25 20 - 29 mmol/L   Calcium 9.3 8.7 - 10.3 mg/dL   Total Protein 6.7 6.0 - 8.5 g/dL   Albumin 4.3 3.7 - 4.7 g/dL   Globulin, Total 2.4 1.5 - 4.5 g/dL   Albumin/Globulin Ratio 1.8 1.2 - 2.2   Bilirubin Total 0.2 0.0 - 1.2 mg/dL   Alkaline Phosphatase 101 44 - 121 IU/L   AST 21 0 - 40 IU/L   ALT 13 0 - 32 IU/L  CBC with Differential/Platelet  Result Value Ref Range   WBC 6.0 3.4 - 10.8 x10E3/uL   RBC 3.90 3.77 - 5.28 x10E6/uL   Hemoglobin 11.1 11.1 - 15.9 g/dL   Hematocrit 35.4 34.0 - 46.6 %   MCV 91 79 - 97 fL   MCH 28.5 26.6 - 33.0 pg   MCHC 31.4 (L)  31.5 - 35.7 g/dL   RDW 14.5 11.7 - 15.4 %   Platelets 332 150 - 450 x10E3/uL   Neutrophils 66 Not Estab. %   Lymphs 21 Not Estab. %   Monocytes 8 Not Estab. %   Eos 5 Not Estab. %   Basos 0 Not Estab. %   Neutrophils Absolute 4.0 1.4 - 7.0 x10E3/uL   Lymphocytes Absolute 1.3 0.7 - 3.1 x10E3/uL   Monocytes Absolute 0.5 0.1 - 0.9 x10E3/uL   EOS (ABSOLUTE) 0.3 0.0 - 0.4 x10E3/uL   Basophils Absolute 0.0 0.0 - 0.2 x10E3/uL   Immature Granulocytes 0 Not Estab. %   Immature Grans (Abs) 0.0 0.0 - 0.1 x10E3/uL  Lipid Panel w/o Chol/HDL Ratio  Result Value Ref Range   Cholesterol, Total 155 100 - 199 mg/dL   Triglycerides 134 0 - 149 mg/dL   HDL 50 >39 mg/dL   VLDL Cholesterol  Cal 24 5 - 40 mg/dL   LDL Chol Calc (NIH) 81 0 - 99 mg/dL  Microalbumin, Urine Waived  Result Value Ref Range   Microalb, Ur Waived 150 (H) 0 - 19 mg/L   Creatinine, Urine Waived 200 10 - 300 mg/dL   Microalb/Creat Ratio 30-300 (H) <30 mg/g  Uric acid  Result Value Ref Range   Uric Acid 5.4 3.1 - 7.9 mg/dL      Assessment & Plan:   Problem List Items Addressed This Visit   None    Follow up plan: No follow-ups on file.

## 2022-05-05 NOTE — Assessment & Plan Note (Signed)
Reassured patient. Call with any concerns.

## 2022-05-05 NOTE — Assessment & Plan Note (Signed)
Will keep BP and cholesterol under good control. Continue to monitor.  

## 2022-05-05 NOTE — Progress Notes (Signed)
BP 125/81   Pulse 67   Temp 98.3 F (36.8 C)   Ht _0  (1.499 m)   Wt 136 lb (61.7 kg)   SpO2 97%   BMI 27.47 kg/m    Subjective:    Patient ID: Marie Parker, female    DOB: 1945/09/21, 76 y.o.   MRN: [id number]  HPI: Marie Parker is a 76 y.o. female presenting on 05/05/2022 for comprehensive medical examination. Current medical complaints include:  HYPERTENSION / HYPERLIPIDEMIA Satisfied with current treatment? yes Duration of hypertension: chronic BP monitoring frequency: not checking BP medication side effects: no Past BP meds: atenolol, benazepril, hydralazine, amlodipine Duration of hyperlipidemia: chronic Cholesterol medication side effects: no Cholesterol supplements: none Past cholesterol medications: atorvastatin Medication compliance: excellent compliance Aspirin: yes Recent stressors: no Recurrent headaches: no Visual changes: no Palpitations: no Dyspnea: no Chest pain: no Lower extremity edema: no Dizzy/lightheaded: no  No gout flares.   ANXIETY/STRESS Duration:stable Anxious mood: yes  Excessive worrying: yes Irritability: no  Sweating: no Nausea: no Palpitations:no Hyperventilation: no Panic attacks: no Agoraphobia: no  Obscessions/compulsions: no Depressed mood: no    05/05/2022    9:25 AM 04/10/2022   11:40 AM 10/25/2021    8:22 AM 10/03/2021   11:33 AM 04/26/2021    8:09 AM  Depression screen PHQ 2/9  Decreased Interest 0 0 0 0 0  Down, Depressed, Hopeless 0 0 0 0 0  PHQ - 2 Score 0 0 0 0 0  Altered sleeping _1 Tired, decreased energy _2 Change in appetite 0  0 0 0  Feeling bad or failure about yourself  0  0 0 0  Trouble concentrating 0  0 0 0  Moving slowly or fidgety/restless 0  0 0 0  Suicidal thoughts 0  0 0 0  PHQ-9 Score _3 Difficult doing work/chores Not difficult at all    Not difficult at all      05/05/2022    9:25 AM 10/25/2021    8:23 AM 10/03/2021   11:33 AM 04/26/2021    8:10 AM  GAD 7 :  Generalized Anxiety Score  Nervous, Anxious, on Edge 0 0 1 1  Control/stop worrying 0 0 1 1  Worry too much - different things 0 0 1 0  Trouble relaxing 0 0 1 0  Restless 0 0 0 1  Easily annoyed or irritable 0 0 1 3  Afraid - awful might happen 0 0 0 0  Total GAD 7 Score 0 0 5 6  Anxiety Difficulty   Not difficult at all Not difficult at all   Anhedonia: no Weight changes: no Insomnia: no  Hypersomnia: no Fatigue/loss of energy: no Feelings of worthlessness: no Feelings of guilt: no Impaired concentration/indecisiveness: no Suicidal ideations: no  Crying spells: no Recent Stressors/Life Changes: no   Relationship problems: no   Family stress: no     Financial stress: no    Job stress: no    Recent death/loss: no  She currently lives with: husband Menopausal Symptoms: yes  Depression Screen done today and results listed below:     05/05/2022    9:25 AM 04/10/2022   11:40 AM 10/25/2021    8:22 AM 10/03/2021   11:33 AM 04/26/2021    8:09 AM  Depression screen PHQ 2/9  Decreased Interest 0 0 0 0 0  Down, Depressed, Hopeless 0 0 0 0 0  PHQ -  2 Score 0 0 0 0 0  Altered sleeping _0 Tired, decreased energy _1 Change in appetite 0  0 0 0  Feeling bad or failure about yourself  0  0 0 0  Trouble concentrating 0  0 0 0  Moving slowly or fidgety/restless 0  0 0 0  Suicidal thoughts 0  0 0 0  PHQ-9 Score _2 Difficult doing work/chores Not difficult at all    Not difficult at all    Past Medical History:  Past Medical History:  Diagnosis Date   Anxiety    Arthritis    Breast cancer ([place])    Breast cancer of upper-outer quadrant of left female breast ([place]) 04/22/2018   16 mm, T1c, N0.  ER/PR positive, HER-2/neu not overexpressed.  Low MammaPrint score.     CAD (coronary artery disease)    Cancer ([facility])    squamous cell   Cataract    Cholecystitis 04/10/2018   Chronic kidney disease    Colon cancer screening 2017?   negative cologuard   Depression     Family history of adverse reaction to anesthesia    mother had N/V   Generalized osteoarthritis    GERD (gastroesophageal reflux disease)    Gout    History of endoscopy 11/2012   Gastritis   Hyperlipidemia    Hypertension    IFG (impaired fasting glucose)    Insomnia    Menopause    Morton's neuroma of right foot 11/28/2019   Osteoporosis    Parathyroid adenoma    Personal history of radiation therapy 2020   Left breast   Personal history of tobacco use, presenting hazards to health 02/02/2016   PVD (peripheral vascular disease) ([place])    PVD (peripheral vascular disease) ([place])    Rotator cuff syndrome of shoulder and allied disorders    Tobacco abuse     Surgical History:  Past Surgical History:  Procedure Laterality Date   AIKEN OSTEOTOMY Right 06/11/2020   Procedure: DOUBLE OSTEOTOMY RIGHT;  Surgeon: Samara Deist, DPM;  Location: [facility] ORS;  Service: Podiatry;  Laterality: Right;   BREAST BIOPSY Left 2014   COMPLEX FIBROADENOMA   BREAST BIOPSY Left 04/05/2018   [facility]   BREAST EXCISIONAL BIOPSY Left 1970's   neg   BREAST FIBROADENOMA SURGERY     BREAST LUMPECTOMY     BREAST MAMMOSITE Left 04/2018   CARDIAC CATHETERIZATION  09/17/2007   Insignificant CAD with 40-50% stenosis of mid LAD.    CATARACT EXTRACTION, BILATERAL  12/2020   COLONOSCOPY WITH PROPOFOL N/A 02/09/2020   Procedure: COLONOSCOPY WITH PROPOFOL;  Surgeon: Lin Landsman, MD;  Location: [facility] ENDOSCOPY;  Service: Gastroenterology;  Laterality: N/A;   ESOPHAGOGASTRODUODENOSCOPY (EGD) WITH PROPOFOL N/A 02/09/2020   Procedure: ESOPHAGOGASTRODUODENOSCOPY (EGD) WITH PROPOFOL;  Surgeon: Lin Landsman, MD;  Location: [facility] ENDOSCOPY;  Service: Gastroenterology;  Laterality: N/A;   EXCISION MORTON'S NEUROMA Right 06/11/2020   Procedure: EXCISION MORTON'S NEUROMA;  Surgeon: Samara Deist, DPM;  Location: [facility] ORS;  Service: Podiatry;  Laterality: Right;   HAMMER TOE SURGERY Right 06/11/2020   Procedure:  HAMMER TOE CORRECTION T6;  Surgeon: Samara Deist, DPM;  Location: [facility] ORS;  Service: Podiatry;  Laterality: Right;   ILIAC ARTERY STENT     right iliac artery stent    MASTECTOMY, PARTIAL Left 04/22/2018   Procedure: MASTECTOMY PARTIAL;  Surgeon: Robert Bellow, MD;  Location: [facility] ORS;  Service: General;  Laterality: Left;   ORIF PATELLA Left 03/22/2016   Dr. Tristan Schroeder   PARATHYROIDECTOMY  2012   [facility]   SENTINEL NODE BIOPSY Left 04/22/2018   Procedure: SENTINEL NODE BIOPSY;  Surgeon: Robert Bellow, MD;  Location: [facility] ORS;  Service: General;  Laterality: Left;   SKIN BIOPSY     precancerous    Medications:  Current Outpatient Medications on File Prior to Visit  Medication Sig   acetaminophen (TYLENOL) 650 MG CR tablet Take 1,300 mg by mouth daily as needed for pain.    aspirin 81 MG tablet Take 1 tablet (81 mg total) by mouth daily. (Patient taking differently: Take 81 mg by mouth at bedtime.)   CALCIUM-VITAMIN D PO Take 2 tablets by mouth daily.   clobetasol ointment (TEMOVATE) 0.05 % Daily at night for 6-12 weeks, if improvement, decrease to 2-3x per week   clonazePAM (KLONOPIN) 0.5 MG tablet Take 0.5-1 tablets (0.25-0.5 mg total) by mouth 2 (two) times daily as needed for anxiety.   fluticasone (FLONASE) 50 MCG/ACT nasal spray Place 2 sprays into both nostrils daily.   HYDROcodone-acetaminophen (NORCO) 5-325 MG tablet Take 1 tablet by mouth every 6 (six) hours as needed for moderate pain.   letrozole (FEMARA) 2.5 MG tablet TAKE 1 TABLET(2.5 MG) BY MOUTH DAILY (Patient taking differently: Take 2.5 mg by mouth daily.)   Multiple Vitamin (MULTIVITAMIN WITH MINERALS) TABS tablet Take 1 tablet by mouth daily.   niacin 500 MG tablet Take 1,000 mg by mouth at bedtime.   Probiotic Product (PROBIOTIC-10 PO) Take by mouth daily.    triamcinolone ointment (KENALOG) 0.5 % Apply 1 Application topically 2 (two) times daily.   Turmeric 500 MG TABS Take 500 mg by mouth daily.    UNABLE TO  FIND Med Name: QC-10   No current facility-administered medications on file prior to visit.    Allergies:  Allergies  Allergen Reactions   Alendronate Nausea Only and Other (See Comments)    Other reaction(s): NAUSEA GI upset   Amoxicillin-Pot Clavulanate Diarrhea   Crestor [Rosuvastatin] Nausea Only   Cyclobenzaprine Other (See Comments)    Vertigo, couldn't focus eyes   Erythromycin Other (See Comments)    GI upset    Fosamax [Alendronate Sodium] Nausea And Vomiting    Social History:  Social History   Socioeconomic History   Marital status: Married    Spouse name: Not on file   Number of children: Not on file   Years of education: Not on file   Highest education level: High school graduate  Occupational History   Occupation: part time [employer]   Tobacco Use   Smoking status: Former    Packs/day: 1.00    Years: 42.00    Total pack years: 42.00    Types: Cigarettes    Quit date: 08/28/2000    Years since quitting: 21.6   Smokeless tobacco: Never  Vaping Use   Vaping Use: Never used  Substance and Sexual Activity   Alcohol use: No   Drug use: No   Sexual activity: Not Currently  Other Topics Concern   Not on file  Social History Narrative   Working part time   Psychologist, occupational at the hospital on Mondays    Social Determinants of Health   Financial Resource Strain: Low Risk  (04/10/2022)   Overall Financial Resource Strain (CARDIA)    Difficulty of Paying Living Expenses: Not hard at all  Food Insecurity: No Food Insecurity (04/10/2022)   Hunger  Vital Sign    Worried About Charity fundraiser in the Last Year: Never true    Ran Out of Food in the Last Year: Never true  Transportation Needs: No Transportation Needs (04/10/2022)   PRAPARE - Hydrologist (Medical): No    Lack of Transportation (Non-Medical): No  Physical Activity: Inactive (04/10/2022)   Exercise Vital Sign    Days of Exercise per Week: 0 days    Minutes of  Exercise per Session: 0 min  Stress: No Stress Concern Present (04/10/2022)   [place]    Feeling of Stress : Not at all  Social Connections: Moderately Integrated (04/10/2022)   Social Connection and Isolation Panel [NHANES]    Frequency of Communication with Friends and Family: More than three times a week    Frequency of Social Gatherings with Friends and Family: Never    Attends Religious Services: More than 4 times per year    Active Member of [employer] or Organizations: No    Attends Archivist Meetings: Never    Marital Status: Married  Human resources officer Violence: Not At Risk (04/10/2022)   Humiliation, Afraid, Rape, and Kick questionnaire    Fear of Current or Ex-Partner: No    Emotionally Abused: No    Physically Abused: No    Sexually Abused: No   Social History   Tobacco Use  Smoking Status Former   Packs/day: 1.00   Years: 42.00   Total pack years: 42.00   Types: Cigarettes   Quit date: 08/28/2000   Years since quitting: 21.6  Smokeless Tobacco Never   Social History   Substance and Sexual Activity  Alcohol Use No    Family History:  Family History  Problem Relation Age of Onset   Stroke Mother    Hypertension Mother    Diabetes Mother    Heart attack Father 30   Alcohol abuse Father    Heart disease Father    Liver disease Father    Hypertension Father    Heart disease Sister 98       CAD; s/p stent placement   Stroke Sister    AAA (abdominal aortic aneurysm) Sister    Dementia Sister    Breast cancer Maternal Aunt 55   Bone cancer Paternal Uncle    Stroke Maternal Grandmother    Heart attack Maternal Grandfather    Stroke Paternal Grandmother    Breast cancer Cousin        mat cousin    Past medical history, surgical history, medications, allergies, family history and social history reviewed with patient today and changes made to appropriate areas of the chart.   Review of  Systems  Constitutional: Negative.   HENT: Negative.    Eyes: Negative.   Respiratory:  Positive for cough. Negative for hemoptysis, sputum production, shortness of breath and wheezing.   Cardiovascular: Negative.   Gastrointestinal:  Positive for heartburn. Negative for abdominal pain, blood in stool, constipation, diarrhea, melena, nausea and vomiting.  Genitourinary: Negative.   Musculoskeletal:  Positive for myalgias. Negative for back pain, falls, joint pain and neck pain.  Skin: Negative.   Neurological:  Positive for tingling. Negative for dizziness, tremors, sensory change, speech change, focal weakness, seizures, loss of consciousness, weakness and headaches.  Endo/Heme/Allergies:  Positive for environmental allergies. Negative for polydipsia. Bruises/bleeds easily.  Psychiatric/Behavioral: Negative.     All other ROS negative except what is listed above and  in the HPI.      Objective:    BP 125/81   Pulse 67   Temp 98.3 F (36.8 C)   Ht _0  (1.499 m)   Wt 136 lb (61.7 kg)   SpO2 97%   BMI 27.47 kg/m   Wt Readings from Last 3 Encounters:  05/05/22 136 lb (61.7 kg)  03/24/22 136 lb 4.8 oz (61.8 kg)  03/23/22 137 lb 12.8 oz (62.5 kg)    Physical Exam Vitals and nursing note reviewed.  Constitutional:      General: She is not in acute distress.    Appearance: Normal appearance. She is not ill-appearing, toxic-appearing or diaphoretic.  HENT:     Head: Normocephalic and atraumatic.     Right Ear: Tympanic membrane, ear canal and external ear normal. There is no impacted cerumen.     Left Ear: Tympanic membrane, ear canal and external ear normal. There is no impacted cerumen.     Nose: Nose normal. No congestion or rhinorrhea.     Mouth/Throat:     Mouth: Mucous membranes are moist.     Pharynx: Oropharynx is clear. No oropharyngeal exudate or posterior oropharyngeal erythema.  Eyes:     General: No scleral icterus.       Right eye: No discharge.        Left  eye: No discharge.     Extraocular Movements: Extraocular movements intact.     Conjunctiva/sclera: Conjunctivae normal.     Pupils: Pupils are equal, round, and reactive to light.  Neck:     Vascular: No carotid bruit.  Cardiovascular:     Rate and Rhythm: Normal rate and regular rhythm.     Pulses: Normal pulses.     Heart sounds: No murmur heard.    No friction rub. No gallop.  Pulmonary:     Effort: Pulmonary effort is normal. No respiratory distress.     Breath sounds: Normal breath sounds. No stridor. No wheezing, rhonchi or rales.  Chest:     Chest wall: No tenderness.  Abdominal:     General: Abdomen is flat. Bowel sounds are normal. There is no distension.     Palpations: Abdomen is soft. There is no mass.     Tenderness: There is no abdominal tenderness. There is no right CVA tenderness, left CVA tenderness, guarding or rebound.     Hernia: No hernia is present.  Genitourinary:    Comments: Breast and pelvic exams deferred with shared decision making Musculoskeletal:        General: No swelling, tenderness, deformity or signs of injury.     Cervical back: Normal range of motion and neck supple. No rigidity. No muscular tenderness.     Right lower leg: No edema.     Left lower leg: No edema.  Lymphadenopathy:     Cervical: No cervical adenopathy.  Skin:    General: Skin is warm and dry.     Capillary Refill: Capillary refill takes less than 2 seconds.     Coloration: Skin is not jaundiced or pale.     Findings: No bruising, erythema, lesion or rash.  Neurological:     General: No focal deficit present.     Mental Status: She is alert and oriented to person, place, and time. Mental status is at baseline.     Cranial Nerves: No cranial nerve deficit.     Sensory: No sensory deficit.     Motor: No weakness.     Coordination: Coordination  normal.     Gait: Gait normal.     Deep Tendon Reflexes: Reflexes normal.  Psychiatric:        Mood and Affect: Mood normal.         Behavior: Behavior normal.        Thought Content: Thought content normal.        Judgment: Judgment normal.     Results for orders placed or performed in visit on 05/05/22  Urinalysis, Routine w reflex microscopic  Result Value Ref Range   Specific Gravity, UA 1.015 1.005 - 1.030   pH, UA 6.0 5.0 - 7.5   Color, UA Yellow Yellow   Appearance Ur Clear Clear   Leukocytes,UA Negative Negative   Protein,UA Trace (A) Negative/Trace   Glucose, UA Negative Negative   Ketones, UA Negative Negative   RBC, UA Negative Negative   Bilirubin, UA Negative Negative   Urobilinogen, Ur 0.2 0.2 - 1.0 mg/dL   Nitrite, UA Negative Negative  Microalbumin, Urine Waived  Result Value Ref Range   Microalb, Ur Waived 80 (H) 0 - 19 mg/L   Creatinine, Urine Waived 100 10 - 300 mg/dL   Microalb/Creat Ratio 30-300 (H) <30 mg/g      Assessment & Plan:   Problem List Items Addressed This Visit       Cardiovascular and Mediastinum   Senile purpura ([place])    Reassured patient. Call with any concerns.       Relevant Medications   hydrALAZINE (APRESOLINE) 25 MG tablet   benazepril (LOTENSIN) 40 MG tablet   atorvastatin (LIPITOR) 40 MG tablet   atenolol (TENORMIN) 50 MG tablet   amLODipine (NORVASC) 10 MG tablet   Aortic atherosclerosis (H[facility])    Will keep BP and cholesterol under good control. Continue to monitor.       Relevant Medications   hydrALAZINE (APRESOLINE) 25 MG tablet   benazepril (LOTENSIN) 40 MG tablet   atorvastatin (LIPITOR) 40 MG tablet   atenolol (TENORMIN) 50 MG tablet   amLODipine (NORVASC) 10 MG tablet   Other Relevant Orders   CBC with Differential/Platelet   Comprehensive metabolic panel     Digestive   GERD (gastroesophageal reflux disease)    Under good control on current regimen. Continue current regimen. Continue to monitor. Call with any concerns. Refills given. Labs drawn today.       Relevant Medications   omeprazole (PRILOSEC) 40 MG capsule   Other  Relevant Orders   CBC with Differential/Platelet   Comprehensive metabolic panel     Genitourinary   Benign hypertensive renal disease    Under good control on current regimen. Continue current regimen. Continue to monitor. Call with any concerns. Refills given. Labs drawn today.       Relevant Orders   CBC with Differential/Platelet   Comprehensive metabolic panel   Urinalysis, Routine w reflex microscopic (Completed)   TSH   Microalbumin, Urine Waived (Completed)     Other   Hyperlipidemia    Under good control on current regimen. Continue current regimen. Continue to monitor. Call with any concerns. Refills given. Labs drawn today.       Relevant Medications   hydrALAZINE (APRESOLINE) 25 MG tablet   benazepril (LOTENSIN) 40 MG tablet   atorvastatin (LIPITOR) 40 MG tablet   atenolol (TENORMIN) 50 MG tablet   amLODipine (NORVASC) 10 MG tablet   Other Relevant Orders   CBC with Differential/Platelet   Comprehensive metabolic panel   Lipid Panel w/o Chol/HDL Ratio  Gout    Under good control on current regimen. Continue current regimen. Continue to monitor. Call with any concerns. Refills given. Labs drawn today.       Relevant Orders   CBC with Differential/Platelet   Comprehensive metabolic panel   Uric acid   GAD (generalized anxiety disorder)    Under good control on current regimen. Continue current regimen. Continue to monitor. Call with any concerns. Refills given. Does not need refill on klonopin. Call with any concerns.        Relevant Medications   sertraline (ZOLOFT) 100 MG tablet   amitriptyline (ELAVIL) 25 MG tablet   Other Relevant Orders   CBC with Differential/Platelet   Comprehensive metabolic panel   TSH   Malignant neoplasm of overlapping sites of left female breast ([place])    On femara. Continue to follow with surgery and oncology. Call with any concerns.       Other Visit Diagnoses     Routine general medical examination at a health care  facility    -  Primary   Vaccines up to date. Screening labs checked today. Mammo, DEXA and Colonoscopy up to date. Continue diet and exercise. Call with any concerns.         Follow up plan: Return in about 6 months (around 11/03/2022).   LABORATORY TESTING:  - Pap smear: not applicable  IMMUNIZATIONS:   - Tdap: Tetanus vaccination status reviewed: last tetanus booster within 10 years. - Influenza: Postponed to flu season - Pneumovax: Up to date - Prevnar: Up to date - COVID: Up to date - HPV: Not applicable - Shingrix vaccine: Refused  SCREENING: -Mammogram: Up to date  - Colonoscopy: Up to date  - Bone Density: Up to date   PATIENT COUNSELING:   Advised to take 1 mg of folate supplement per day if capable of pregnancy.   Sexuality: Discussed sexually transmitted diseases, partner selection, use of condoms, avoidance of unintended pregnancy  and contraceptive alternatives.   Advised to avoid cigarette smoking.  I discussed with the patient that most people either abstain from alcohol or drink within safe limits (<=14/week and <=4 drinks/occasion for males, <=7/weeks and <= 3 drinks/occasion for females) and that the risk for alcohol disorders and other health effects rises proportionally with the number of drinks per week and how often a drinker exceeds daily limits.  Discussed cessation/primary prevention of drug use and availability of treatment for abuse.   Diet: Encouraged to adjust caloric intake to maintain  or achieve ideal body weight, to reduce intake of dietary saturated fat and total fat, to limit sodium intake by avoiding high sodium foods and not adding table salt, and to maintain adequate dietary potassium and calcium preferably from fresh fruits, vegetables, and low-fat dairy products.    stressed the importance of regular exercise  Injury prevention: Discussed safety belts, safety helmets, smoke detector, smoking near bedding or upholstery.   Dental health:  Discussed importance of regular tooth brushing, flossing, and dental visits.    NEXT PREVENTATIVE PHYSICAL DUE IN 1 YEAR. Return in about 6 months (around 11/03/2022).

## 2022-05-05 NOTE — Assessment & Plan Note (Signed)
Under good control on current regimen. Continue current regimen. Continue to monitor. Call with any concerns. Refills given. Does not need refill on klonopin. Call with any concerns.

## 2022-05-06 LAB — LIPID PANEL W/O CHOL/HDL RATIO
Cholesterol, Total: 138 mg/dL (ref 100–199)
HDL: 49 mg/dL (ref >39)
LDL Chol Calc (NIH): 67 mg/dL (ref 0–99)
Triglycerides: 123 mg/dL (ref 0–149)
VLDL Cholesterol Cal: 22 mg/dL (ref 5–40)

## 2022-05-06 LAB — COMPREHENSIVE METABOLIC PANEL
ALT: 18 IU/L (ref 0–32)
AST: 24 IU/L (ref 0–40)
Albumin/Globulin Ratio: 1.7 (ref 1.2–2.2)
Albumin: 4.1 g/dL (ref 3.8–4.8)
Alkaline Phosphatase: 87 IU/L (ref 44–121)
BUN/Creatinine Ratio: 9 — ABNORMAL LOW (ref 12–28)
BUN: 7 mg/dL — ABNORMAL LOW (ref 8–27)
Bilirubin Total: 0.3 mg/dL (ref 0.0–1.2)
CO2: 25 mmol/L (ref 20–29)
Calcium: 8.6 mg/dL — ABNORMAL LOW (ref 8.7–10.3)
Chloride: 111 mmol/L — ABNORMAL HIGH (ref 96–106)
Creatinine, Ser: 0.75 mg/dL (ref 0.57–1.00)
Globulin, Total: 2.4 g/dL (ref 1.5–4.5)
Glucose: 89 mg/dL (ref 70–99)
Potassium: 3.6 mmol/L (ref 3.5–5.2)
Sodium: 129 mmol/L — ABNORMAL LOW (ref 134–144)
Total Protein: 6.5 g/dL (ref 6.0–8.5)
eGFR: 83 mL/min/{1.73_m2} (ref >59)

## 2022-05-06 LAB — CBC WITH DIFFERENTIAL/PLATELET
Basophils Absolute: 0 10*3/uL (ref 0.0–0.2)
Basos: 0 %
EOS (ABSOLUTE): 0.2 10*3/uL (ref 0.0–0.4)
Eos: 3 %
Hematocrit: 35.1 % (ref 34.0–46.6)
Hemoglobin: 11.4 g/dL (ref 11.1–15.9)
Immature Grans (Abs): 0 10*3/uL (ref 0.0–0.1)
Immature Granulocytes: 0 %
Lymphocytes Absolute: 1.2 10*3/uL (ref 0.7–3.1)
Lymphs: 19 %
MCH: 29.6 pg (ref 26.6–33.0)
MCHC: 32.5 g/dL (ref 31.5–35.7)
MCV: 91 fL (ref 79–97)
Monocytes Absolute: 0.4 10*3/uL (ref 0.1–0.9)
Monocytes: 7 %
Neutrophils Absolute: 4.4 10*3/uL (ref 1.4–7.0)
Neutrophils: 71 %
Platelets: 238 10*3/uL (ref 150–450)
RBC: 3.85 x10E6/uL (ref 3.77–5.28)
RDW: 14.5 % (ref 11.7–15.4)
WBC: 6.2 10*3/uL (ref 3.4–10.8)

## 2022-05-06 LAB — TSH: TSH: 1.92 u[IU]/mL (ref 0.450–4.500)

## 2022-05-06 LAB — URIC ACID: Uric Acid: 6.6 mg/dL (ref 3.1–7.9)

## 2022-05-07 ENCOUNTER — Other Ambulatory Visit: Payer: Self-pay | Admitting: Family Medicine

## 2022-05-07 DIAGNOSIS — E871 Hypo-osmolality and hyponatremia: Secondary | ICD-10-CM

## 2022-05-12 ENCOUNTER — Other Ambulatory Visit: Payer: Medicare Other

## 2022-05-12 DIAGNOSIS — E871 Hypo-osmolality and hyponatremia: Secondary | ICD-10-CM

## 2022-05-13 LAB — BASIC METABOLIC PANEL
BUN/Creatinine Ratio: 12 (ref 12–28)
BUN: 9 mg/dL (ref 8–27)
CO2: 23 mmol/L (ref 20–29)
Calcium: 8.6 mg/dL — ABNORMAL LOW (ref 8.7–10.3)
Chloride: 104 mmol/L (ref 96–106)
Creatinine, Ser: 0.76 mg/dL (ref 0.57–1.00)
Glucose: 85 mg/dL (ref 70–99)
Potassium: 3.9 mmol/L (ref 3.5–5.2)
Sodium: 143 mmol/L (ref 134–144)
eGFR: 82 mL/min/{1.73_m2} (ref >59)

## 2022-05-15 ENCOUNTER — Encounter: Payer: Self-pay | Admitting: General Surgery

## 2022-05-15 DIAGNOSIS — C50412 Malignant neoplasm of upper-outer quadrant of left female breast: Secondary | ICD-10-CM | POA: Diagnosis not present

## 2022-05-29 DIAGNOSIS — M7062 Trochanteric bursitis, left hip: Secondary | ICD-10-CM | POA: Diagnosis not present

## 2022-06-23 ENCOUNTER — Encounter: Payer: Self-pay | Admitting: Family Medicine

## 2022-07-19 ENCOUNTER — Other Ambulatory Visit: Payer: Self-pay | Admitting: Family Medicine

## 2022-07-19 DIAGNOSIS — Z13820 Encounter for screening for osteoporosis: Secondary | ICD-10-CM

## 2022-08-10 ENCOUNTER — Encounter: Payer: Self-pay | Admitting: Cardiovascular Disease

## 2022-08-10 ENCOUNTER — Ambulatory Visit: Payer: Medicare Other | Attending: Cardiovascular Disease | Admitting: Cardiovascular Disease

## 2022-08-10 VITALS — BP 120/80 | HR 67 | Ht 59.0 in | Wt 138.0 lb

## 2022-08-10 DIAGNOSIS — I739 Peripheral vascular disease, unspecified: Secondary | ICD-10-CM

## 2022-08-10 DIAGNOSIS — I10 Essential (primary) hypertension: Secondary | ICD-10-CM

## 2022-08-10 DIAGNOSIS — E785 Hyperlipidemia, unspecified: Secondary | ICD-10-CM | POA: Diagnosis not present

## 2022-08-10 DIAGNOSIS — I447 Left bundle-branch block, unspecified: Secondary | ICD-10-CM

## 2022-08-10 NOTE — Progress Notes (Signed)
Cardiology Office Note   Date:  08/10/2022   ID:  Marie Parker, DOB 1946/07/20, MRN [id number]  PCP:  Valerie Roys, DO  Cardiologist:   Kathlyn Sacramento, MD   Chief Complaint  Patient presents with   Other    12 month f/u no complaints today. Meds reviewed verbally with pt.      History of Present Illness: Marie Parker is a 76 y.o. female who is here for a follow up visit regarding left bundle branch block, pericardial disease and previous atypical chest pain. She has history of GERD, prior tobacco use and breast cancer status post mastectomy. She has remote history of right iliac stenting in the early 2000. She had cardiac catheterization done in 2009 which showed 40% LAD stenosis. She was seen in 2018 for atypical chest pain.  She underwent a Lexiscan Myoview which showed no evidence of ischemia with normal ejection fraction.  She has been doing very well with no recent chest pain, shortness of breath or palpitations.  No lower extremity claudication.     Past Medical History:  Diagnosis Date   Anxiety    Arthritis    Breast cancer ([place])    Breast cancer of upper-outer quadrant of left female breast ([place]) 04/22/2018   16 mm, T1c, N0.  ER/PR positive, HER-2/neu not overexpressed.  Low MammaPrint score.     CAD (coronary artery disease)    Cancer (HCC)    squamous cell   Cataract    Cholecystitis 04/10/2018   Chronic kidney disease    Colon cancer screening 2017?   negative cologuard   Depression    Family history of adverse reaction to anesthesia    mother had N/V   Generalized osteoarthritis    GERD (gastroesophageal reflux disease)    Gout    History of endoscopy 11/2012   Gastritis   Hyperlipidemia    Hypertension    IFG (impaired fasting glucose)    Insomnia    Menopause    Morton's neuroma of right foot 11/28/2019   Osteoporosis    Parathyroid adenoma    Personal history of radiation therapy 2020   Left breast   Personal history of tobacco  use, presenting hazards to health 02/02/2016   PVD (peripheral vascular disease) ([place])    PVD (peripheral vascular disease) ([place])    Rotator cuff syndrome of shoulder and allied disorders    Tobacco abuse     Past Surgical History:  Procedure Laterality Date   AIKEN OSTEOTOMY Right 06/11/2020   Procedure: DOUBLE OSTEOTOMY RIGHT;  Surgeon: Samara Deist, DPM;  Location: [facility] ORS;  Service: Podiatry;  Laterality: Right;   BREAST BIOPSY Left 2014   COMPLEX FIBROADENOMA   BREAST BIOPSY Left 04/05/2018   [facility]   BREAST EXCISIONAL BIOPSY Left 1970's   neg   BREAST FIBROADENOMA SURGERY     BREAST LUMPECTOMY     BREAST MAMMOSITE Left 04/2018   CARDIAC CATHETERIZATION  09/17/2007   Insignificant CAD with 40-50% stenosis of mid LAD.    CATARACT EXTRACTION, BILATERAL  12/2020   COLONOSCOPY WITH PROPOFOL N/A 02/09/2020   Procedure: COLONOSCOPY WITH PROPOFOL;  Surgeon: Lin Landsman, MD;  Location: [facility] ENDOSCOPY;  Service: Gastroenterology;  Laterality: N/A;   ESOPHAGOGASTRODUODENOSCOPY (EGD) WITH PROPOFOL N/A 02/09/2020   Procedure: ESOPHAGOGASTRODUODENOSCOPY (EGD) WITH PROPOFOL;  Surgeon: Lin Landsman, MD;  Location: [facility] ENDOSCOPY;  Service: Gastroenterology;  Laterality: N/A;   EXCISION MORTON'S NEUROMA Right 06/11/2020   Procedure: EXCISION MORTON'S NEUROMA;  Surgeon:  Samara Deist, DPM;  Location: [facility] ORS;  Service: Podiatry;  Laterality: Right;   HAMMER TOE SURGERY Right 06/11/2020   Procedure: HAMMER TOE CORRECTION T6;  Surgeon: Samara Deist, DPM;  Location: [facility] ORS;  Service: Podiatry;  Laterality: Right;   ILIAC ARTERY STENT     right iliac artery stent    MASTECTOMY, PARTIAL Left 04/22/2018   Procedure: MASTECTOMY PARTIAL;  Surgeon: Robert Bellow, MD;  Location: [facility] ORS;  Service: General;  Laterality: Left;   ORIF PATELLA Left 03/22/2016   Dr. Tristan Schroeder   PARATHYROIDECTOMY  2012   [facility]   SENTINEL NODE BIOPSY Left 04/22/2018   Procedure: SENTINEL NODE BIOPSY;   Surgeon: Robert Bellow, MD;  Location: [facility] ORS;  Service: General;  Laterality: Left;   SKIN BIOPSY     precancerous     Current Outpatient Medications  Medication Sig Dispense Refill   acetaminophen (TYLENOL) 650 MG CR tablet Take 1,300 mg by mouth daily as needed for pain.      amitriptyline (ELAVIL) 25 MG tablet Take 1 tablet (25 mg total) by mouth at bedtime. 100 tablet 1   amLODipine (NORVASC) 10 MG tablet Take 1 tablet (10 mg total) by mouth daily. 100 tablet 1   aspirin 81 MG tablet Take 1 tablet (81 mg total) by mouth daily. (Patient taking differently: Take 81 mg by mouth at bedtime.) 90 tablet 3   atenolol (TENORMIN) 50 MG tablet Take 1 tablet (50 mg total) by mouth daily. 100 tablet 1   atorvastatin (LIPITOR) 40 MG tablet Take 1 tablet (40 mg total) by mouth at bedtime. 100 tablet 1   benazepril (LOTENSIN) 40 MG tablet Take 1 tablet (40 mg total) by mouth daily. 100 tablet 1   CALCIUM-VITAMIN D PO Take 2 tablets by mouth daily.     clobetasol ointment (TEMOVATE) 0.05 % Daily at night for 6-12 weeks, if improvement, decrease to 2-3x per week 60 g 1   clonazePAM (KLONOPIN) 0.5 MG tablet Take 0.5-1 tablets (0.25-0.5 mg total) by mouth 2 (two) times daily as needed for anxiety. 30 tablet 0   fluticasone (FLONASE) 50 MCG/ACT nasal spray Place 2 sprays into both nostrils daily. 48 g 12   hydrALAZINE (APRESOLINE) 25 MG tablet Take 1 tablet (25 mg total) by mouth 2 (two) times daily. 200 tablet 1   HYDROcodone-acetaminophen (NORCO) 5-325 MG tablet Take 1 tablet by mouth every 6 (six) hours as needed for moderate pain. 20 tablet 0   letrozole (FEMARA) 2.5 MG tablet TAKE 1 TABLET(2.5 MG) BY MOUTH DAILY (Patient taking differently: Take 2.5 mg by mouth daily.) 30 tablet 11   Multiple Vitamin (MULTIVITAMIN WITH MINERALS) TABS tablet Take 1 tablet by mouth daily.     niacin 500 MG tablet Take 1,000 mg by mouth at bedtime.     omeprazole (PRILOSEC) 40 MG capsule TAKE 1 CAPSULE BY MOUTH   TWICE DAILY BEFORE MEALS 200 capsule 3   Probiotic Product (PROBIOTIC-10 PO) Take by mouth daily.      sertraline (ZOLOFT) 100 MG tablet Take 1.5 tablets (150 mg total) by mouth daily. 150 tablet 1   tiZANidine (ZANAFLEX) 2 MG tablet TAKE 1 TABLET(2 MG) BY MOUTH EVERY 6 HOURS AS NEEDED FOR MUSCLE SPASMS 30 tablet 0   triamcinolone ointment (KENALOG) 0.5 % Apply 1 Application topically 2 (two) times daily. 30 g 0   Turmeric 500 MG TABS Take 500 mg by mouth daily.      UNABLE TO FIND Med Name: QC-10  No current facility-administered medications for this visit.    Allergies:   Alendronate, Amoxicillin-pot clavulanate, Crestor [rosuvastatin], Cyclobenzaprine, Erythromycin, and Fosamax [alendronate sodium]    Social History:  The patient  reports that she quit smoking about 21 years ago. Her smoking use included cigarettes. She has a 42.00 pack-year smoking history. She has never used smokeless tobacco. She reports that she does not drink alcohol and does not use drugs.   Family History:  The patient's family history includes AAA (abdominal aortic aneurysm) in her sister; Alcohol abuse in her father; Bone cancer in her paternal uncle; Breast cancer in her cousin; Breast cancer (age of onset: 22) in her maternal aunt; Dementia in her sister; Diabetes in her mother; Heart attack in her maternal grandfather; Heart attack (age of onset: 26) in her father; Heart disease in her father; Heart disease (age of onset: 53) in her sister; Hypertension in her father and mother; Liver disease in her father; Stroke in her maternal grandmother, mother, paternal grandmother, and sister.    ROS:  Please see the history of present illness.   Otherwise, review of systems are positive for none.   All other systems are reviewed and negative.    PHYSICAL EXAM: VS:  BP 120/80 (BP Location: Left Arm, Patient Position: Sitting, Cuff Size: Normal)   Pulse 67   Ht _0  (1.499 m)   Wt 138 lb (62.6 kg)   SpO2 96%    BMI 27.87 kg/m  , BMI Body mass index is 27.87 kg/m. GEN: Well nourished, well developed, in no acute distress  HEENT: normal  Neck: no JVD, carotid bruits, or masses Cardiac: RRR; no murmurs, rubs, or gallops,no edema  Respiratory:  clear to auscultation bilaterally, normal work of breathing GI: soft, nontender, nondistended, + BS MS: no deformity or atrophy  Skin: warm and dry, no rash Neuro:  Strength and sensation are intact Psych: euthymic mood, full affect Vascular:  Distal pulses are palpable.  EKG:  EKG is ordered today. The ekg ordered today demonstrates  normal sinus rhythm with left bundle branch block.   Recent Labs: 05/05/2022: ALT 18; Hemoglobin 11.4; Platelets 238; TSH 1.920 05/12/2022: BUN 9; Creatinine, Ser 0.76; Potassium 3.9; Sodium 143    Lipid Panel    Component Value Date/Time   CHOL 138 05/05/2022 0950   CHOL 129 06/21/2016 0839   TRIG 123 05/05/2022 0950   TRIG 136 06/21/2016 0839   HDL 49 05/05/2022 0950   VLDL 27 06/21/2016 0839   LDLCALC 67 05/05/2022 0950      Wt Readings from Last 3 Encounters:  08/10/22 138 lb (62.6 kg)  05/05/22 136 lb (61.7 kg)  03/24/22 136 lb 4.8 oz (61.8 kg)          12/28/2016   11:07 AM  PAD Screen  Previous PAD dx? No  Previous surgical procedure? Yes  Dates of procedures iliac stent over 15-20 years ago.  Pain with walking? Yes  Subsides with rest? Yes  Feet/toe relief with dangling? No  Painful, non-healing ulcers? No  Extremities discolored? No      ASSESSMENT AND PLAN:  1.  Left bundle branch block: Currently with no symptoms suggestive of arrhythmia.   2. Essential hypertension: Her blood pressure is well-controlled on current medications.   3. Peripheral arterial disease: Previous iliac stenting with no recurrent claudication. Continue aspirin.  She has normal distal pulses.   4. Hyperlipidemia: Continue treatment with atorvastatin .  Most recent lipid profile showed an LDL of  67.    Disposition:   FU with me in 12 months  Signed,  Kathlyn Sacramento, MD  08/10/2022 9:33 AM    [place]

## 2022-08-10 NOTE — Patient Instructions (Signed)

## 2022-09-27 ENCOUNTER — Ambulatory Visit
Admission: RE | Admit: 2022-09-27 | Discharge: 2022-09-27 | Disposition: A | Discharge status: Home / Self Care | Payer: Medicare Other | Source: Ambulatory Visit | Attending: Family Medicine | Admitting: Family Medicine

## 2022-09-27 ENCOUNTER — Other Ambulatory Visit: Payer: Medicare Other

## 2022-09-27 DIAGNOSIS — M8589 Other specified disorders of bone density and structure, multiple sites: Secondary | ICD-10-CM | POA: Diagnosis not present

## 2022-09-27 DIAGNOSIS — Z780 Asymptomatic menopausal state: Secondary | ICD-10-CM | POA: Insufficient documentation

## 2022-09-27 DIAGNOSIS — Z13820 Encounter for screening for osteoporosis: Secondary | ICD-10-CM | POA: Diagnosis not present

## 2022-10-18 ENCOUNTER — Encounter: Payer: Self-pay | Admitting: Emergency Medicine

## 2022-10-18 ENCOUNTER — Ambulatory Visit: Admission: EM | Admit: 2022-10-18 | Discharge: 2022-10-18 | Payer: Medicare Other

## 2022-10-18 DIAGNOSIS — W19XXXA Unspecified fall, initial encounter: Secondary | ICD-10-CM

## 2022-10-18 DIAGNOSIS — N189 Chronic kidney disease, unspecified: Secondary | ICD-10-CM | POA: Diagnosis not present

## 2022-10-18 DIAGNOSIS — Z79899 Other long term (current) drug therapy: Secondary | ICD-10-CM | POA: Diagnosis not present

## 2022-10-18 DIAGNOSIS — M79602 Pain in left arm: Secondary | ICD-10-CM | POA: Diagnosis not present

## 2022-10-18 DIAGNOSIS — I10 Essential (primary) hypertension: Secondary | ICD-10-CM | POA: Diagnosis not present

## 2022-10-18 DIAGNOSIS — K219 Gastro-esophageal reflux disease without esophagitis: Secondary | ICD-10-CM | POA: Diagnosis not present

## 2022-10-18 DIAGNOSIS — S0990XA Unspecified injury of head, initial encounter: Secondary | ICD-10-CM | POA: Diagnosis not present

## 2022-10-18 DIAGNOSIS — I739 Peripheral vascular disease, unspecified: Secondary | ICD-10-CM | POA: Diagnosis not present

## 2022-10-18 DIAGNOSIS — S42202A Unspecified fracture of upper end of left humerus, initial encounter for closed fracture: Secondary | ICD-10-CM | POA: Diagnosis not present

## 2022-10-18 DIAGNOSIS — S42342A Displaced spiral fracture of shaft of humerus, left arm, initial encounter for closed fracture: Secondary | ICD-10-CM | POA: Diagnosis not present

## 2022-10-18 DIAGNOSIS — S199XXA Unspecified injury of neck, initial encounter: Secondary | ICD-10-CM | POA: Diagnosis not present

## 2022-10-18 DIAGNOSIS — Z7982 Long term (current) use of aspirin: Secondary | ICD-10-CM | POA: Diagnosis not present

## 2022-10-18 DIAGNOSIS — S42302A Unspecified fracture of shaft of humerus, left arm, initial encounter for closed fracture: Secondary | ICD-10-CM | POA: Diagnosis not present

## 2022-10-18 DIAGNOSIS — M79622 Pain in left upper arm: Secondary | ICD-10-CM

## 2022-10-18 DIAGNOSIS — E785 Hyperlipidemia, unspecified: Secondary | ICD-10-CM | POA: Diagnosis not present

## 2022-10-18 NOTE — ED Provider Notes (Signed)
[facility]    CSN: [id number] Arrival date & time: 10/18/22  1201      History   Chief Complaint Chief Complaint  Patient presents with   Fall    HPI Marie Parker is a 77 y.o. female.   HPI  77 year old female here for evaluation of left arm pain status post fall.  The patient is here with her daughter and she has a significant past medical history to include parathyroid adenoma with parathyroidectomy, chronic kidney disease, peripheral vascular disease, GERD, coronary artery disease, and hypertension presenting for evaluation of pain in her left upper arm after suffering a ground-level fall at home earlier today.  She reports that she tripped over her husband's oxygen tubing and struck the wall with her head.  She is unsure if she struck her arm on the wall or on the floor when she landed.  She denies any loss of consciousness, change in vision, nausea, or vomiting.  She denies any numbness or tingling in her fingers.  She does take a baby aspirin daily as well as 5 mg of turmeric daily.  Past Medical History:  Diagnosis Date   Anxiety    Arthritis    Breast cancer ([place])    Breast cancer of upper-outer quadrant of left female breast ([place]) 04/22/2018   16 mm, T1c, N0.  ER/PR positive, HER-2/neu not overexpressed.  Low MammaPrint score.     CAD (coronary artery disease)    Cancer (HCC)    squamous cell   Cataract    Cholecystitis 04/10/2018   Chronic kidney disease    Colon cancer screening 2017?   negative cologuard   Depression    Family history of adverse reaction to anesthesia    mother had N/V   Generalized osteoarthritis    GERD (gastroesophageal reflux disease)    Gout    History of endoscopy 11/2012   Gastritis   Hyperlipidemia    Hypertension    IFG (impaired fasting glucose)    Insomnia    Menopause    Morton's neuroma of right foot 11/28/2019   Osteoporosis    Parathyroid adenoma    Personal history of radiation therapy 2020   Left  breast   Personal history of tobacco use, presenting hazards to health 02/02/2016   PVD (peripheral vascular disease) ([place])    PVD (peripheral vascular disease) ([place])    Rotator cuff syndrome of shoulder and allied disorders    Tobacco abuse     Patient Active Problem List   Diagnosis Date Noted   Hamstring tear 10/03/2021   Aortic atherosclerosis ([place]) 11/23/2020   History of colonic polyps    Osteoarthritis of elbow 11/28/2019   Bunion 09/09/2019   Osteopenia 06/17/2018   Malignant neoplasm of overlapping sites of left female breast ([place]) 04/10/2018   Liver cyst 02/22/2018   Senile purpura ([place]) 12/27/2017   GAD (generalized anxiety disorder) 06/21/2016   Personal history of tobacco use, presenting hazards to health 02/02/2016   Multiple pulmonary nodules determined by computed tomography of lung 12/21/2015   Benign hypertensive renal disease 06/22/2015   Hyperlipidemia 06/22/2015   Gout [id number]   Lichen sclerosus 0000000   GERD (gastroesophageal reflux disease) 01/22/2013   Peripheral vascular disease ([place]) 01/22/2013   CAD (coronary artery disease) 01/22/2013    Past Surgical History:  Procedure Laterality Date   AIKEN OSTEOTOMY Right 06/11/2020   Procedure: DOUBLE OSTEOTOMY RIGHT;  Surgeon: Samara Deist, DPM;  Location: [facility] ORS;  Service: Podiatry;  Laterality: Right;  BREAST BIOPSY Left 2014   COMPLEX FIBROADENOMA   BREAST BIOPSY Left 04/05/2018   [facility]   BREAST EXCISIONAL BIOPSY Left 1970's   neg   BREAST FIBROADENOMA SURGERY     BREAST LUMPECTOMY     BREAST MAMMOSITE Left 04/2018   CARDIAC CATHETERIZATION  09/17/2007   Insignificant CAD with 40-50% stenosis of mid LAD.    CATARACT EXTRACTION, BILATERAL  12/2020   COLONOSCOPY WITH PROPOFOL N/A 02/09/2020   Procedure: COLONOSCOPY WITH PROPOFOL;  Surgeon: Lin Landsman, MD;  Location: [facility] ENDOSCOPY;  Service: Gastroenterology;  Laterality: N/A;   ESOPHAGOGASTRODUODENOSCOPY (EGD) WITH PROPOFOL N/A  02/09/2020   Procedure: ESOPHAGOGASTRODUODENOSCOPY (EGD) WITH PROPOFOL;  Surgeon: Lin Landsman, MD;  Location: [facility] ENDOSCOPY;  Service: Gastroenterology;  Laterality: N/A;   EXCISION MORTON'S NEUROMA Right 06/11/2020   Procedure: EXCISION MORTON'S NEUROMA;  Surgeon: Samara Deist, DPM;  Location: [facility] ORS;  Service: Podiatry;  Laterality: Right;   HAMMER TOE SURGERY Right 06/11/2020   Procedure: HAMMER TOE CORRECTION T6;  Surgeon: Samara Deist, DPM;  Location: [facility] ORS;  Service: Podiatry;  Laterality: Right;   ILIAC ARTERY STENT     right iliac artery stent    MASTECTOMY, PARTIAL Left 04/22/2018   Procedure: MASTECTOMY PARTIAL;  Surgeon: Robert Bellow, MD;  Location: [facility] ORS;  Service: General;  Laterality: Left;   ORIF PATELLA Left 03/22/2016   Dr. Tristan Schroeder   PARATHYROIDECTOMY  2012   [facility]   SENTINEL NODE BIOPSY Left 04/22/2018   Procedure: SENTINEL NODE BIOPSY;  Surgeon: Robert Bellow, MD;  Location: [facility] ORS;  Service: General;  Laterality: Left;   SKIN BIOPSY     precancerous    OB History     Gravida  2   Para  2   Term      Preterm      AB      Living  2      SAB      IAB      Ectopic      Multiple      Live Births           Obstetric Comments  Menstrual age: 27  Age 1st Pregnancy: 58           Home Medications    Prior to Admission medications   Medication Sig Start Date End Date Taking? Authorizing Provider  acetaminophen (TYLENOL) 650 MG CR tablet Take 1,300 mg by mouth daily as needed for pain.     [provider]  amitriptyline (ELAVIL) 25 MG tablet Take 1 tablet (25 mg total) by mouth at bedtime. 05/05/22   Johnson, Megan P, DO  amLODipine (NORVASC) 10 MG tablet Take 1 tablet (10 mg total) by mouth daily. 05/05/22   Park Liter P, DO  aspirin 81 MG tablet Take 1 tablet (81 mg total) by mouth daily. Patient taking differently: Take 81 mg by mouth at bedtime. 12/27/17   Johnson, Megan P, DO  atenolol (TENORMIN) 50  MG tablet Take 1 tablet (50 mg total) by mouth daily. 05/05/22   Johnson, Megan P, DO  atorvastatin (LIPITOR) 40 MG tablet Take 1 tablet (40 mg total) by mouth at bedtime. 05/05/22   Johnson, Megan P, DO  benazepril (LOTENSIN) 40 MG tablet Take 1 tablet (40 mg total) by mouth daily. 05/05/22   Johnson, Megan P, DO  CALCIUM-VITAMIN D PO Take 2 tablets by mouth daily.    [provider]  clobetasol ointment (TEMOVATE) 0.05 % Daily at night  for 6-12 weeks, if improvement, decrease to 2-3x per week 10/25/21   Park Liter P, DO  clonazePAM (KLONOPIN) 0.5 MG tablet Take 0.5-1 tablets (0.25-0.5 mg total) by mouth 2 (two) times daily as needed for anxiety. 10/25/21   Johnson, Megan P, DO  fluticasone (FLONASE) 50 MCG/ACT nasal spray Place 2 sprays into both nostrils daily. 10/25/21   Park Liter P, DO  hydrALAZINE (APRESOLINE) 25 MG tablet Take 1 tablet (25 mg total) by mouth 2 (two) times daily. 05/05/22   Johnson, Megan P, DO  HYDROcodone-acetaminophen (NORCO) 5-325 MG tablet Take 1 tablet by mouth every 6 (six) hours as needed for moderate pain. 10/03/21   Cannady, Henrine Screws T, NP  letrozole (FEMARA) 2.5 MG tablet TAKE 1 TABLET(2.5 MG) BY MOUTH DAILY Patient taking differently: Take 2.5 mg by mouth daily. 05/25/20   Noreene Filbert, MD  Multiple Vitamin (MULTIVITAMIN WITH MINERALS) TABS tablet Take 1 tablet by mouth daily.    [provider]  niacin 500 MG tablet Take 1,000 mg by mouth at bedtime.    [provider]  omeprazole (PRILOSEC) 40 MG capsule TAKE 1 CAPSULE BY MOUTH  TWICE DAILY BEFORE MEALS 05/05/22   Johnson, Megan P, DO  Probiotic Product (PROBIOTIC-10 PO) Take by mouth daily.     [provider]  sertraline (ZOLOFT) 100 MG tablet Take 1.5 tablets (150 mg total) by mouth daily. 05/05/22   Johnson, Megan P, DO  tiZANidine (ZANAFLEX) 2 MG tablet TAKE 1 TABLET(2 MG) BY MOUTH EVERY 6 HOURS AS NEEDED FOR MUSCLE SPASMS 05/05/22   Johnson, Megan P, DO  triamcinolone ointment  (KENALOG) 0.5 % Apply 1 Application topically 2 (two) times daily. 03/23/22   Johnson, Megan P, DO  Turmeric 500 MG TABS Take 500 mg by mouth daily.     [provider]  UNABLE TO FIND Med Name: QC-10    [provider]    Family History Family History  Problem Relation Age of Onset   Stroke Mother    Hypertension Mother    Diabetes Mother    Heart attack Father 89   Alcohol abuse Father    Heart disease Father    Liver disease Father    Hypertension Father    Heart disease Sister 47       CAD; s/p stent placement   Stroke Sister    AAA (abdominal aortic aneurysm) Sister    Dementia Sister    Breast cancer Maternal Aunt 75   Bone cancer Paternal Uncle    Stroke Maternal Grandmother    Heart attack Maternal Grandfather    Stroke Paternal Grandmother    Breast cancer Cousin        mat cousin    Social History Social History   Tobacco Use   Smoking status: Former    Packs/day: 1.00    Years: 42.00    Total pack years: 42.00    Types: Cigarettes    Quit date: 08/28/2000    Years since quitting: 22.1   Smokeless tobacco: Never  Vaping Use   Vaping Use: Never used  Substance Use Topics   Alcohol use: No   Drug use: No     Allergies   Alendronate, Amoxicillin-pot clavulanate, Crestor [rosuvastatin], Cyclobenzaprine, Erythromycin, and Fosamax [alendronate sodium]   Review of Systems Review of Systems  Eyes:  Negative for visual disturbance.  Gastrointestinal:  Negative for nausea and vomiting.  Musculoskeletal:  Positive for arthralgias and myalgias.  Neurological:  Negative for weakness and numbness.  Physical Exam Triage Vital Signs ED Triage Vitals  Enc Vitals Group     BP      Pulse      Resp      Temp      Temp src      SpO2      Weight      Height      Head Circumference      Peak Flow      Pain Score      Pain Loc      Pain Edu?      Excl. in [place]?    No data found.  Updated Vital Signs BP (!) 175/88 (BP Location:  Right Arm)   Pulse 80   Temp 98.5 F (36.9 C) (Oral)   Resp 16   SpO2 98%   Visual Acuity Right Eye Distance:   Left Eye Distance:   Bilateral Distance:    Right Eye Near:   Left Eye Near:    Bilateral Near:     Physical Exam Vitals and nursing note reviewed.  Constitutional:      Appearance: Normal appearance. She is not ill-appearing.  HENT:     Head: Normocephalic and atraumatic.     Mouth/Throat:     Mouth: Mucous membranes are moist.     Pharynx: Oropharynx is clear. No oropharyngeal exudate or posterior oropharyngeal erythema.  Eyes:     General: No scleral icterus.    Extraocular Movements: Extraocular movements intact.     Conjunctiva/sclera: Conjunctivae normal.     Pupils: Pupils are equal, round, and reactive to light.  Musculoskeletal:        General: Tenderness and signs of injury present. No swelling or deformity.  Skin:    General: Skin is warm and dry.     Capillary Refill: Capillary refill takes less than 2 seconds.  Neurological:     General: No focal deficit present.     Mental Status: She is alert and oriented to person, place, and time.     Cranial Nerves: No cranial nerve deficit.  Psychiatric:        Mood and Affect: Mood normal.        Behavior: Behavior normal.        Thought Content: Thought content normal.        Judgment: Judgment normal.      UC Treatments / Results  Labs (all labs ordered are listed, but only abnormal results are displayed) Labs Reviewed - No data to display  EKG   Radiology No results found.  Procedures Procedures (including critical care time)  Medications Ordered in UC Medications - No data to display  Initial Impression / Assessment and Plan / UC Course  I have reviewed the triage vital signs and the nursing notes.  Pertinent labs & imaging results that were available during my care of the patient were reviewed by me and considered in my medical decision making (see chart for details).   Patient  is a nontoxic-appearing significant-year-old female who has a significant past medical history presents for evaluation of closed head injury and left upper arm pain after suffering a ground-level fall earlier today.  She denies loss of consciousness, visual changes, nausea or vomiting, or numbness, tingling, weakness in any of her extremities.  Cranial nerves II through XII are intact.  She is able to answer questions appropriately.  Radial ulnar pulses in her left wrist are 2+.  There is no pain with palpation of the wrist,  radius, or ulna of her left forearm.  She does have pain with palpation of the medial and lateral epicondyle of the humerus as well as palpation of the central shaft of the humerus.  No pain with palpation of the glenohumeral joint, acromion process, or clavicle.  Given the patient's age coupled with her aspirin and turmeric therapy I feel she needs a head CT to rule out any acute intracranial process even though she did not have a loss of consciousness.  Her daughter is in agreement and they have elected to go to [facility] and I feel she is safe to travel via POV.  She left ambulatory and in stable condition with a makeshift sling for her left arm.   Final Clinical Impressions(s) / UC Diagnoses   Final diagnoses:  Injury of head, initial encounter  Fall, initial encounter  Pain in left upper arm     Discharge Instructions      Please go to the ER at [facility] to be evaluated for your closed head injury.  Even though you did not have a loss of consciousness because you are on aspirin and turmeric it does increase your risk of bleeding.  Due to your age there is also an increased risk of intracranial bleeding after a closed head injury.  They can also image her left upper arm at that time.     ED Prescriptions   None    PDMP not reviewed this encounter.   Margarette Canada, NP 10/18/22 [phone]

## 2022-10-18 NOTE — ED Notes (Signed)
Patient is being discharged from the Urgent Care and sent to the Emergency Department via personal vehicle . Per Margarette Canada NP, patient is in need of higher level of care due to head injury due to fall. Patient is aware and verbalizes understanding of plan of care.  Vitals:   10/18/22 1247  BP: (!) 175/88  Pulse: 80  Resp: 16  Temp: 98.5 F (36.9 C)  SpO2: 98%

## 2022-10-18 NOTE — Discharge Instructions (Addendum)
Please go to the ER at [facility] to be evaluated for your closed head injury.  Even though you did not have a loss of consciousness because you are on aspirin and turmeric it does increase your risk of bleeding.  Due to your age there is also an increased risk of intracranial bleeding after a closed head injury.  They can also image her left upper arm at that time.

## 2022-10-18 NOTE — ED Triage Notes (Signed)
Pt tripped over her husbands oxygen tubing and fell. She hit her head against the wall and has left shoulder pain. She does not remember what she hit her shoulder on.

## 2022-10-24 ENCOUNTER — Other Ambulatory Visit: Payer: Self-pay | Admitting: Family Medicine

## 2022-10-25 NOTE — Telephone Encounter (Signed)
Requested medication (s) are due for refill today: yes  Requested medication (s) are on the active medication list: yes  Last refill:  05/05/22 #200 1 RF  Future visit scheduled: yes  Notes to clinic:  overdue ANA (last result 2018)   Requested Prescriptions  Pending Prescriptions Disp Refills   hydrALAZINE (APRESOLINE) 25 MG tablet [Pharmacy Med Name: hydrALAZINE HCl 25 MG Oral Tablet] 200 tablet 2    Sig: TAKE 1 TABLET BY MOUTH TWICE  DAILY     Cardiovascular:  Vasodilators Failed - 10/24/2022 11:01 PM      Failed - ANA Screen, Ifa, Serum in normal range and within 360 days    Anti Nuclear Antibody(ANA)  Date Value Ref Range Status  07/24/2017 Negative Negative Final         Failed - Last BP in normal range    BP Readings from Last 1 Encounters:  10/18/22 (!) 175/88         Passed - HCT in normal range and within 360 days    Hematocrit  Date Value Ref Range Status  05/05/2022 35.1 34.0 - 46.6 % Final         Passed - HGB in normal range and within 360 days    Hemoglobin  Date Value Ref Range Status  05/05/2022 11.4 11.1 - 15.9 g/dL Final         Passed - RBC in normal range and within 360 days    RBC  Date Value Ref Range Status  05/05/2022 3.85 3.77 - 5.28 x10E6/uL Final  04/15/2018 3.93 3.80 - 5.20 MIL/uL Final         Passed - WBC in normal range and within 360 days    WBC  Date Value Ref Range Status  05/05/2022 6.2 3.4 - 10.8 x10E3/uL Final  04/15/2018 4.8 3.6 - 11.0 K/uL Final         Passed - PLT in normal range and within 360 days    Platelets  Date Value Ref Range Status  05/05/2022 238 150 - 450 x10E3/uL Final         Passed - Valid encounter within last 12 months    Recent Outpatient Visits           5 months ago Routine general medical examination at a health care facility   [place], Megan P, DO   7 months ago Left leg pain   [place], [place], DO   1 year ago  Senile purpura [facility])   [place], Megan P, DO   1 year ago Hamstring tear   [place], Henrine Screws T, NP   1 year ago [place], [place], DO       Future Appointments             In 1 week Valerie Roys, DO [place], [facility]

## 2022-10-26 DIAGNOSIS — S42202A Unspecified fracture of upper end of left humerus, initial encounter for closed fracture: Secondary | ICD-10-CM | POA: Diagnosis not present

## 2022-10-26 DIAGNOSIS — S42302A Unspecified fracture of shaft of humerus, left arm, initial encounter for closed fracture: Secondary | ICD-10-CM | POA: Diagnosis not present

## 2022-10-26 DIAGNOSIS — S42309A Unspecified fracture of shaft of humerus, unspecified arm, initial encounter for closed fracture: Secondary | ICD-10-CM | POA: Diagnosis not present

## 2022-11-03 ENCOUNTER — Other Ambulatory Visit: Payer: Self-pay | Admitting: Family Medicine

## 2022-11-03 ENCOUNTER — Ambulatory Visit (INDEPENDENT_AMBULATORY_CARE_PROVIDER_SITE_OTHER): Payer: Medicare Other | Admitting: Family Medicine

## 2022-11-03 ENCOUNTER — Encounter: Payer: Self-pay | Admitting: Family Medicine

## 2022-11-03 VITALS — BP 134/80 | HR 76 | Temp 98.4°F | Wt 142.5 lb

## 2022-11-03 DIAGNOSIS — D692 Other nonthrombocytopenic purpura: Secondary | ICD-10-CM

## 2022-11-03 DIAGNOSIS — M1A9XX0 Chronic gout, unspecified, without tophus (tophi): Secondary | ICD-10-CM | POA: Diagnosis not present

## 2022-11-03 DIAGNOSIS — J340 Abscess, furuncle and carbuncle of nose: Secondary | ICD-10-CM | POA: Diagnosis not present

## 2022-11-03 DIAGNOSIS — I739 Peripheral vascular disease, unspecified: Secondary | ICD-10-CM | POA: Diagnosis not present

## 2022-11-03 DIAGNOSIS — I700 Atherosclerosis of aorta: Secondary | ICD-10-CM

## 2022-11-03 DIAGNOSIS — I129 Hypertensive chronic kidney disease with stage 1 through stage 4 chronic kidney disease, or unspecified chronic kidney disease: Secondary | ICD-10-CM | POA: Diagnosis not present

## 2022-11-03 DIAGNOSIS — F411 Generalized anxiety disorder: Secondary | ICD-10-CM

## 2022-11-03 DIAGNOSIS — I2510 Atherosclerotic heart disease of native coronary artery without angina pectoris: Secondary | ICD-10-CM

## 2022-11-03 DIAGNOSIS — K219 Gastro-esophageal reflux disease without esophagitis: Secondary | ICD-10-CM

## 2022-11-03 DIAGNOSIS — E782 Mixed hyperlipidemia: Secondary | ICD-10-CM | POA: Diagnosis not present

## 2022-11-03 DIAGNOSIS — S42342D Displaced spiral fracture of shaft of humerus, left arm, subsequent encounter for fracture with routine healing: Secondary | ICD-10-CM

## 2022-11-03 MED ORDER — SERTRALINE HCL 100 MG PO TABS: 150.0000 mg | ORAL_TABLET | Freq: Every day | ORAL | 1 refills | Status: DC

## 2022-11-03 MED ORDER — OMEPRAZOLE 40 MG PO CPDR: DELAYED_RELEASE_CAPSULE | ORAL | 3 refills | Status: DC

## 2022-11-03 MED ORDER — ATORVASTATIN CALCIUM 40 MG PO TABS: 40.0000 mg | ORAL_TABLET | Freq: Every day | ORAL | 1 refills | Status: DC

## 2022-11-03 MED ORDER — FLUTICASONE PROPIONATE 50 MCG/ACT NA SUSP: 2.0000 | Freq: Every day | NASAL | 12 refills | Status: DC

## 2022-11-03 MED ORDER — MUPIROCIN 2 % EX OINT: 1.0000 | TOPICAL_OINTMENT | Freq: Two times a day (BID) | CUTANEOUS | 0 refills | Status: AC

## 2022-11-03 MED ORDER — ATENOLOL 50 MG PO TABS: 50.0000 mg | ORAL_TABLET | Freq: Every day | ORAL | 1 refills | Status: DC

## 2022-11-03 MED ORDER — AMLODIPINE BESYLATE 10 MG PO TABS: 10.0000 mg | ORAL_TABLET | Freq: Every day | ORAL | 1 refills | Status: DC

## 2022-11-03 MED ORDER — BENAZEPRIL HCL 40 MG PO TABS: 40.0000 mg | ORAL_TABLET | Freq: Every day | ORAL | 1 refills | Status: DC

## 2022-11-03 MED ORDER — HYDRALAZINE HCL 25 MG PO TABS: 25.0000 mg | ORAL_TABLET | Freq: Two times a day (BID) | ORAL | 1 refills | Status: DC

## 2022-11-03 MED ORDER — AMITRIPTYLINE HCL 25 MG PO TABS: 25.0000 mg | ORAL_TABLET | Freq: Every day | ORAL | 1 refills | Status: DC

## 2022-11-03 MED ORDER — CLONAZEPAM 0.5 MG PO TABS: 0.2500 mg | ORAL_TABLET | Freq: Two times a day (BID) | ORAL | 0 refills | Status: DC | PRN

## 2022-11-03 NOTE — Assessment & Plan Note (Signed)
Under good control on current regimen. Continue current regimen. Continue to monitor. Call with any concerns. Refills given. Labs drawn today.   

## 2022-11-03 NOTE — Assessment & Plan Note (Signed)
Will keep BP and cholesterol under good control. Continue to monitor.  

## 2022-11-03 NOTE — Assessment & Plan Note (Addendum)
Under good control on current regimen. Continue current regimen. Continue to monitor. Call with any concerns. Refills given.   

## 2022-11-03 NOTE — Assessment & Plan Note (Signed)
Reassured patient. Continue to monitor.  

## 2022-11-03 NOTE — Progress Notes (Signed)
BP 134/80   Pulse 76   Temp 98.4 F (36.9 C) (Oral)   Wt 142 lb 8 oz (64.6 kg)   SpO2 98%   BMI 28.78 kg/m    Subjective:    Patient ID: Marie Parker, female    DOB: 1946/04/08, 77 y.o.   MRN: [id number]  HPI: Marie Parker is a 77 y.o. female  Chief Complaint  Patient presents with   Hypertension   Hyperlipidemia   Depression   HYPERTENSION / HYPERLIPIDEMIA Satisfied with current treatment? yes Duration of hypertension: chronic BP monitoring frequency: not checking BP medication side effects: no Past BP meds: hydralazine, amlosipine, atenolol, benazepril Duration of hyperlipidemia: chronic Cholesterol medication side effects: no Cholesterol supplements: niacin Past cholesterol medications: atorvastatin Medication compliance: excellent compliance Aspirin: yes Recent stressors: no Recurrent headaches: no Visual changes: no Palpitations: no Dyspnea: no Chest pain: no Lower extremity edema: no Dizzy/lightheaded: no  No gout flares. Feeling well.   ANXIETY/DEPRESSION Duration; Chronic Status:controlled Anxious mood: no  Excessive worrying: no Irritability: no  Sweating: no Nausea: no Palpitations:no Hyperventilation: no Panic attacks: no Agoraphobia: no  Obscessions/compulsions: no Depressed mood: no    11/03/2022    8:49 AM 05/05/2022    9:25 AM 04/10/2022   11:40 AM 10/25/2021    8:22 AM 10/03/2021   11:33 AM  Depression screen PHQ 2/9  Decreased Interest 1 0 0 0 0  Down, Depressed, Hopeless 0 0 0 0 0  PHQ - 2 Score 1 0 0 0 0  Altered sleeping '1 1  1 2  '$ Tired, decreased energy '1 1  1 1  '$ Change in appetite 0 0  0 0  Feeling bad or failure about yourself  0 0  0 0  Trouble concentrating 0 0  0 0  Moving slowly or fidgety/restless 1 0  0 0  Suicidal thoughts 0 0  0 0  PHQ-9 Score '4 2  2 3  '$ Difficult doing work/chores  Not difficult at all      Anhedonia: no Weight changes: no Insomnia: no   Hypersomnia: no Fatigue/loss of energy:  no Feelings of worthlessness: no Feelings of guilt: no Impaired concentration/indecisiveness: no Suicidal ideations: no  Crying spells: no Recent Stressors/Life Changes: no   Relationship problems: no   Family stress: no     Financial stress: no    Job stress: no    Recent death/loss: no  Relevant past medical, surgical, family and social history reviewed and updated as indicated. Interim medical history since our last visit reviewed. Allergies and medications reviewed and updated.  Review of Systems  Constitutional: Negative.   Respiratory: Negative.    Cardiovascular: Negative.   Gastrointestinal: Negative.   Musculoskeletal:  Positive for arthralgias, joint swelling and myalgias. Negative for back pain, gait problem, neck pain and neck stiffness.  Skin: Negative.   Neurological: Negative.   Psychiatric/Behavioral: Negative.      Per HPI unless specifically indicated above     Objective:    BP 134/80   Pulse 76   Temp 98.4 F (36.9 C) (Oral)   Wt 142 lb 8 oz (64.6 kg)   SpO2 98%   BMI 28.78 kg/m   Wt Readings from Last 3 Encounters:  11/03/22 142 lb 8 oz (64.6 kg)  08/10/22 138 lb (62.6 kg)  05/05/22 136 lb (61.7 kg)    Physical Exam Vitals and nursing note reviewed.  Constitutional:      General: She is not in acute distress.  Appearance: Normal appearance. She is not ill-appearing, toxic-appearing or diaphoretic.  HENT:     Head: Normocephalic and atraumatic.     Right Ear: External ear normal.     Left Ear: External ear normal.     Nose: Nose normal.     Mouth/Throat:     Mouth: Mucous membranes are moist.     Pharynx: Oropharynx is clear.  Eyes:     General: No scleral icterus.       Right eye: No discharge.        Left eye: No discharge.     Extraocular Movements: Extraocular movements intact.     Conjunctiva/sclera: Conjunctivae normal.     Pupils: Pupils are equal, round, and reactive to light.  Cardiovascular:     Rate and Rhythm: Normal  rate and regular rhythm.     Pulses: Normal pulses.     Heart sounds: Normal heart sounds. No murmur heard.    No friction rub. No gallop.  Pulmonary:     Effort: Pulmonary effort is normal. No respiratory distress.     Breath sounds: Normal breath sounds. No stridor. No wheezing, rhonchi or rales.  Chest:     Chest wall: No tenderness.  Musculoskeletal:     Cervical back: Normal range of motion and neck supple.     Comments: Significant swelling of L arm, in sling  Skin:    General: Skin is warm and dry.     Capillary Refill: Capillary refill takes less than 2 seconds.     Coloration: Skin is not jaundiced or pale.     Findings: No bruising, erythema, lesion or rash.  Neurological:     General: No focal deficit present.     Mental Status: She is alert and oriented to person, place, and time. Mental status is at baseline.  Psychiatric:        Mood and Affect: Mood normal.        Behavior: Behavior normal.        Thought Content: Thought content normal.        Judgment: Judgment normal.     Results for orders placed or performed in visit on 99991111  Basic metabolic panel  Result Value Ref Range   Glucose 85 70 - 99 mg/dL   BUN 9 8 - 27 mg/dL   Creatinine, Ser 0.76 0.57 - 1.00 mg/dL   eGFR 82 >59 mL/min/1.73   BUN/Creatinine Ratio 12 12 - 28   Sodium 143 134 - 144 mmol/L   Potassium 3.9 3.5 - 5.2 mmol/L   Chloride 104 96 - 106 mmol/L   CO2 23 20 - 29 mmol/L   Calcium 8.6 (L) 8.7 - 10.3 mg/dL      Assessment & Plan:   Problem List Items Addressed This Visit       Cardiovascular and Mediastinum   Peripheral vascular disease ([place])    Will keep BP and cholesterol under good control. Continue to monitor.       Relevant Medications   amLODipine (NORVASC) 10 MG tablet   atenolol (TENORMIN) 50 MG tablet   atorvastatin (LIPITOR) 40 MG tablet   benazepril (LOTENSIN) 40 MG tablet   hydrALAZINE (APRESOLINE) 25 MG tablet   CAD (coronary artery disease)    Will keep BP  and cholesterol under good control. Continue to monitor.       Relevant Medications   amLODipine (NORVASC) 10 MG tablet   atenolol (TENORMIN) 50 MG tablet   atorvastatin (LIPITOR) 40 MG  tablet   benazepril (LOTENSIN) 40 MG tablet   hydrALAZINE (APRESOLINE) 25 MG tablet   Senile purpura ([place])    Reassured patient. Continue to monitor.       Relevant Medications   amLODipine (NORVASC) 10 MG tablet   atenolol (TENORMIN) 50 MG tablet   atorvastatin (LIPITOR) 40 MG tablet   benazepril (LOTENSIN) 40 MG tablet   hydrALAZINE (APRESOLINE) 25 MG tablet   Other Relevant Orders   CBC with Differential/Platelet   Aortic atherosclerosis (HCC) - Primary    Will keep BP and cholesterol under good control. Continue to monitor.       Relevant Medications   amLODipine (NORVASC) 10 MG tablet   atenolol (TENORMIN) 50 MG tablet   atorvastatin (LIPITOR) 40 MG tablet   benazepril (LOTENSIN) 40 MG tablet   hydrALAZINE (APRESOLINE) 25 MG tablet   Other Relevant Orders   Lipid Panel w/o Chol/HDL Ratio     Digestive   GERD (gastroesophageal reflux disease)    Under good control on current regimen. Continue current regimen. Continue to monitor. Call with any concerns. Refills given. Labs drawn today.        Relevant Medications   omeprazole (PRILOSEC) 40 MG capsule     Genitourinary   Benign hypertensive renal disease   Relevant Orders   Comprehensive metabolic panel     Other   Hyperlipidemia    Under good control on current regimen. Continue current regimen. Continue to monitor. Call with any concerns. Refills given. Labs drawn today.       Relevant Medications   amLODipine (NORVASC) 10 MG tablet   atenolol (TENORMIN) 50 MG tablet   atorvastatin (LIPITOR) 40 MG tablet   benazepril (LOTENSIN) 40 MG tablet   hydrALAZINE (APRESOLINE) 25 MG tablet   Gout    Under good control on current regimen. Continue current regimen. Continue to monitor. Call with any concerns. Refills given. Labs  drawn today.       Relevant Orders   Uric acid   GAD (generalized anxiety disorder)    Under good control on current regimen. Continue current regimen. Continue to monitor. Call with any concerns. Refills given.       Relevant Medications   amitriptyline (ELAVIL) 25 MG tablet   sertraline (ZOLOFT) 100 MG tablet   Other Visit Diagnoses     Nasal ulcer       Will treat with bactroban. Call with any concerns.        Follow up plan: Return in about 6 months (around 05/06/2023) for physical .

## 2022-11-04 LAB — COMPREHENSIVE METABOLIC PANEL
ALT: 14 IU/L (ref 0–32)
AST: 25 IU/L (ref 0–40)
Albumin/Globulin Ratio: 1.8 (ref 1.2–2.2)
Albumin: 4.1 g/dL (ref 3.8–4.8)
Alkaline Phosphatase: 134 IU/L — ABNORMAL HIGH (ref 44–121)
BUN/Creatinine Ratio: 10 — ABNORMAL LOW (ref 12–28)
BUN: 7 mg/dL — ABNORMAL LOW (ref 8–27)
Bilirubin Total: 0.3 mg/dL (ref 0.0–1.2)
CO2: 23 mmol/L (ref 20–29)
Calcium: 8.3 mg/dL — ABNORMAL LOW (ref 8.7–10.3)
Chloride: 104 mmol/L (ref 96–106)
Creatinine, Ser: 0.72 mg/dL (ref 0.57–1.00)
Globulin, Total: 2.3 g/dL (ref 1.5–4.5)
Glucose: 94 mg/dL (ref 70–99)
Potassium: 3.4 mmol/L — ABNORMAL LOW (ref 3.5–5.2)
Sodium: 144 mmol/L (ref 134–144)
Total Protein: 6.4 g/dL (ref 6.0–8.5)
eGFR: 87 mL/min/{1.73_m2} (ref >59)

## 2022-11-04 LAB — CBC WITH DIFFERENTIAL/PLATELET
Basophils Absolute: 0 10*3/uL (ref 0.0–0.2)
Basos: 0 %
EOS (ABSOLUTE): 0.3 10*3/uL (ref 0.0–0.4)
Eos: 4 %
Hematocrit: 31.7 % — ABNORMAL LOW (ref 34.0–46.6)
Hemoglobin: 9.9 g/dL — ABNORMAL LOW (ref 11.1–15.9)
Immature Grans (Abs): 0 10*3/uL (ref 0.0–0.1)
Immature Granulocytes: 0 %
Lymphocytes Absolute: 1.1 10*3/uL (ref 0.7–3.1)
Lymphs: 17 %
MCH: 28.5 pg (ref 26.6–33.0)
MCHC: 31.2 g/dL — ABNORMAL LOW (ref 31.5–35.7)
MCV: 91 fL (ref 79–97)
Monocytes Absolute: 0.4 10*3/uL (ref 0.1–0.9)
Monocytes: 6 %
Neutrophils Absolute: 4.5 10*3/uL (ref 1.4–7.0)
Neutrophils: 73 %
Platelets: 284 10*3/uL (ref 150–450)
RBC: 3.47 x10E6/uL — ABNORMAL LOW (ref 3.77–5.28)
RDW: 14.8 % (ref 11.7–15.4)
WBC: 6.2 10*3/uL (ref 3.4–10.8)

## 2022-11-04 LAB — LIPID PANEL W/O CHOL/HDL RATIO
Cholesterol, Total: 127 mg/dL (ref 100–199)
HDL: 51 mg/dL (ref >39)
LDL Chol Calc (NIH): 56 mg/dL (ref 0–99)
Triglycerides: 113 mg/dL (ref 0–149)
VLDL Cholesterol Cal: 20 mg/dL (ref 5–40)

## 2022-11-04 LAB — URIC ACID: Uric Acid: 5.6 mg/dL (ref 3.1–7.9)

## 2022-11-09 DIAGNOSIS — L84 Corns and callosities: Secondary | ICD-10-CM | POA: Diagnosis not present

## 2022-11-09 DIAGNOSIS — M7989 Other specified soft tissue disorders: Secondary | ICD-10-CM | POA: Diagnosis not present

## 2022-11-09 DIAGNOSIS — E785 Hyperlipidemia, unspecified: Secondary | ICD-10-CM | POA: Diagnosis not present

## 2022-11-09 DIAGNOSIS — S42302D Unspecified fracture of shaft of humerus, left arm, subsequent encounter for fracture with routine healing: Secondary | ICD-10-CM | POA: Diagnosis not present

## 2022-11-09 DIAGNOSIS — Z7901 Long term (current) use of anticoagulants: Secondary | ICD-10-CM | POA: Diagnosis not present

## 2022-11-09 DIAGNOSIS — I739 Peripheral vascular disease, unspecified: Secondary | ICD-10-CM | POA: Diagnosis not present

## 2022-11-09 DIAGNOSIS — S42302A Unspecified fracture of shaft of humerus, left arm, initial encounter for closed fracture: Secondary | ICD-10-CM | POA: Diagnosis not present

## 2022-11-09 DIAGNOSIS — S42202D Unspecified fracture of upper end of left humerus, subsequent encounter for fracture with routine healing: Secondary | ICD-10-CM | POA: Diagnosis not present

## 2022-11-09 DIAGNOSIS — Z87891 Personal history of nicotine dependence: Secondary | ICD-10-CM | POA: Diagnosis not present

## 2022-11-09 DIAGNOSIS — I2510 Atherosclerotic heart disease of native coronary artery without angina pectoris: Secondary | ICD-10-CM | POA: Diagnosis not present

## 2022-11-09 DIAGNOSIS — D649 Anemia, unspecified: Secondary | ICD-10-CM | POA: Diagnosis not present

## 2022-11-09 DIAGNOSIS — I82622 Acute embolism and thrombosis of deep veins of left upper extremity: Secondary | ICD-10-CM | POA: Diagnosis not present

## 2022-11-09 DIAGNOSIS — Z79899 Other long term (current) drug therapy: Secondary | ICD-10-CM | POA: Diagnosis not present

## 2022-11-09 DIAGNOSIS — S42309A Unspecified fracture of shaft of humerus, unspecified arm, initial encounter for closed fracture: Secondary | ICD-10-CM | POA: Diagnosis not present

## 2022-11-16 ENCOUNTER — Other Ambulatory Visit: Payer: Self-pay | Admitting: Family Medicine

## 2022-11-16 DIAGNOSIS — D649 Anemia, unspecified: Secondary | ICD-10-CM

## 2022-11-16 DIAGNOSIS — I82622 Acute embolism and thrombosis of deep veins of left upper extremity: Secondary | ICD-10-CM | POA: Diagnosis not present

## 2022-11-23 DIAGNOSIS — S42392D Other fracture of shaft of left humerus, subsequent encounter for fracture with routine healing: Secondary | ICD-10-CM | POA: Diagnosis not present

## 2022-11-23 DIAGNOSIS — S42342D Displaced spiral fracture of shaft of humerus, left arm, subsequent encounter for fracture with routine healing: Secondary | ICD-10-CM | POA: Diagnosis not present

## 2022-12-11 DIAGNOSIS — R293 Abnormal posture: Secondary | ICD-10-CM | POA: Diagnosis not present

## 2022-12-11 DIAGNOSIS — M25512 Pain in left shoulder: Secondary | ICD-10-CM | POA: Diagnosis not present

## 2022-12-11 DIAGNOSIS — S42392D Other fracture of shaft of left humerus, subsequent encounter for fracture with routine healing: Secondary | ICD-10-CM | POA: Diagnosis not present

## 2022-12-11 DIAGNOSIS — M25522 Pain in left elbow: Secondary | ICD-10-CM | POA: Diagnosis not present

## 2022-12-12 DIAGNOSIS — Z961 Presence of intraocular lens: Secondary | ICD-10-CM | POA: Diagnosis not present

## 2022-12-12 DIAGNOSIS — H43813 Vitreous degeneration, bilateral: Secondary | ICD-10-CM | POA: Diagnosis not present

## 2022-12-20 DIAGNOSIS — M25512 Pain in left shoulder: Secondary | ICD-10-CM | POA: Diagnosis not present

## 2022-12-20 DIAGNOSIS — R293 Abnormal posture: Secondary | ICD-10-CM | POA: Diagnosis not present

## 2022-12-20 DIAGNOSIS — M25522 Pain in left elbow: Secondary | ICD-10-CM | POA: Diagnosis not present

## 2022-12-20 DIAGNOSIS — S42392D Other fracture of shaft of left humerus, subsequent encounter for fracture with routine healing: Secondary | ICD-10-CM | POA: Diagnosis not present

## 2022-12-21 DIAGNOSIS — I10 Essential (primary) hypertension: Secondary | ICD-10-CM | POA: Diagnosis not present

## 2022-12-21 DIAGNOSIS — S42302D Unspecified fracture of shaft of humerus, left arm, subsequent encounter for fracture with routine healing: Secondary | ICD-10-CM | POA: Diagnosis not present

## 2022-12-21 DIAGNOSIS — S42392D Other fracture of shaft of left humerus, subsequent encounter for fracture with routine healing: Secondary | ICD-10-CM | POA: Diagnosis not present

## 2022-12-21 DIAGNOSIS — E785 Hyperlipidemia, unspecified: Secondary | ICD-10-CM | POA: Diagnosis not present

## 2022-12-22 DIAGNOSIS — M25512 Pain in left shoulder: Secondary | ICD-10-CM | POA: Diagnosis not present

## 2022-12-22 DIAGNOSIS — M25522 Pain in left elbow: Secondary | ICD-10-CM | POA: Diagnosis not present

## 2022-12-22 DIAGNOSIS — S42392D Other fracture of shaft of left humerus, subsequent encounter for fracture with routine healing: Secondary | ICD-10-CM | POA: Diagnosis not present

## 2022-12-22 DIAGNOSIS — R293 Abnormal posture: Secondary | ICD-10-CM | POA: Diagnosis not present

## 2022-12-27 DIAGNOSIS — M25522 Pain in left elbow: Secondary | ICD-10-CM | POA: Diagnosis not present

## 2022-12-27 DIAGNOSIS — R293 Abnormal posture: Secondary | ICD-10-CM | POA: Diagnosis not present

## 2022-12-27 DIAGNOSIS — M25512 Pain in left shoulder: Secondary | ICD-10-CM | POA: Diagnosis not present

## 2022-12-27 DIAGNOSIS — S42392D Other fracture of shaft of left humerus, subsequent encounter for fracture with routine healing: Secondary | ICD-10-CM | POA: Diagnosis not present

## 2022-12-29 DIAGNOSIS — R293 Abnormal posture: Secondary | ICD-10-CM | POA: Diagnosis not present

## 2022-12-29 DIAGNOSIS — M25522 Pain in left elbow: Secondary | ICD-10-CM | POA: Diagnosis not present

## 2022-12-29 DIAGNOSIS — S42392D Other fracture of shaft of left humerus, subsequent encounter for fracture with routine healing: Secondary | ICD-10-CM | POA: Diagnosis not present

## 2022-12-29 DIAGNOSIS — M25512 Pain in left shoulder: Secondary | ICD-10-CM | POA: Diagnosis not present

## 2023-01-03 DIAGNOSIS — S42392D Other fracture of shaft of left humerus, subsequent encounter for fracture with routine healing: Secondary | ICD-10-CM | POA: Diagnosis not present

## 2023-01-03 DIAGNOSIS — R293 Abnormal posture: Secondary | ICD-10-CM | POA: Diagnosis not present

## 2023-01-03 DIAGNOSIS — M25512 Pain in left shoulder: Secondary | ICD-10-CM | POA: Diagnosis not present

## 2023-01-03 DIAGNOSIS — M25522 Pain in left elbow: Secondary | ICD-10-CM | POA: Diagnosis not present

## 2023-01-05 DIAGNOSIS — M25512 Pain in left shoulder: Secondary | ICD-10-CM | POA: Diagnosis not present

## 2023-01-05 DIAGNOSIS — M25522 Pain in left elbow: Secondary | ICD-10-CM | POA: Diagnosis not present

## 2023-01-05 DIAGNOSIS — S42392D Other fracture of shaft of left humerus, subsequent encounter for fracture with routine healing: Secondary | ICD-10-CM | POA: Diagnosis not present

## 2023-01-05 DIAGNOSIS — R293 Abnormal posture: Secondary | ICD-10-CM | POA: Diagnosis not present

## 2023-01-09 DIAGNOSIS — M25522 Pain in left elbow: Secondary | ICD-10-CM | POA: Diagnosis not present

## 2023-01-09 DIAGNOSIS — R293 Abnormal posture: Secondary | ICD-10-CM | POA: Diagnosis not present

## 2023-01-09 DIAGNOSIS — M25512 Pain in left shoulder: Secondary | ICD-10-CM | POA: Diagnosis not present

## 2023-01-09 DIAGNOSIS — S42392D Other fracture of shaft of left humerus, subsequent encounter for fracture with routine healing: Secondary | ICD-10-CM | POA: Diagnosis not present

## 2023-01-19 DIAGNOSIS — R293 Abnormal posture: Secondary | ICD-10-CM | POA: Diagnosis not present

## 2023-01-19 DIAGNOSIS — M25522 Pain in left elbow: Secondary | ICD-10-CM | POA: Diagnosis not present

## 2023-01-19 DIAGNOSIS — M25512 Pain in left shoulder: Secondary | ICD-10-CM | POA: Diagnosis not present

## 2023-01-19 DIAGNOSIS — S42392D Other fracture of shaft of left humerus, subsequent encounter for fracture with routine healing: Secondary | ICD-10-CM | POA: Diagnosis not present

## 2023-01-23 DIAGNOSIS — M25522 Pain in left elbow: Secondary | ICD-10-CM | POA: Diagnosis not present

## 2023-01-23 DIAGNOSIS — S42392D Other fracture of shaft of left humerus, subsequent encounter for fracture with routine healing: Secondary | ICD-10-CM | POA: Diagnosis not present

## 2023-01-23 DIAGNOSIS — R293 Abnormal posture: Secondary | ICD-10-CM | POA: Diagnosis not present

## 2023-01-23 DIAGNOSIS — M25512 Pain in left shoulder: Secondary | ICD-10-CM | POA: Diagnosis not present

## 2023-01-30 DIAGNOSIS — S42392D Other fracture of shaft of left humerus, subsequent encounter for fracture with routine healing: Secondary | ICD-10-CM | POA: Diagnosis not present

## 2023-01-30 DIAGNOSIS — R293 Abnormal posture: Secondary | ICD-10-CM | POA: Diagnosis not present

## 2023-01-30 DIAGNOSIS — M25512 Pain in left shoulder: Secondary | ICD-10-CM | POA: Diagnosis not present

## 2023-01-30 DIAGNOSIS — M25522 Pain in left elbow: Secondary | ICD-10-CM | POA: Diagnosis not present

## 2023-02-06 DIAGNOSIS — S42392D Other fracture of shaft of left humerus, subsequent encounter for fracture with routine healing: Secondary | ICD-10-CM | POA: Diagnosis not present

## 2023-02-06 DIAGNOSIS — R293 Abnormal posture: Secondary | ICD-10-CM | POA: Diagnosis not present

## 2023-02-06 DIAGNOSIS — M25522 Pain in left elbow: Secondary | ICD-10-CM | POA: Diagnosis not present

## 2023-02-06 DIAGNOSIS — M25512 Pain in left shoulder: Secondary | ICD-10-CM | POA: Diagnosis not present

## 2023-02-09 ENCOUNTER — Other Ambulatory Visit: Payer: Self-pay | Admitting: Surgery

## 2023-02-09 DIAGNOSIS — Z1231 Encounter for screening mammogram for malignant neoplasm of breast: Secondary | ICD-10-CM

## 2023-03-06 DIAGNOSIS — I82622 Acute embolism and thrombosis of deep veins of left upper extremity: Secondary | ICD-10-CM | POA: Diagnosis not present

## 2023-03-06 DIAGNOSIS — D509 Iron deficiency anemia, unspecified: Secondary | ICD-10-CM | POA: Diagnosis not present

## 2023-03-12 DIAGNOSIS — I82622 Acute embolism and thrombosis of deep veins of left upper extremity: Secondary | ICD-10-CM | POA: Diagnosis not present

## 2023-03-12 DIAGNOSIS — Z86718 Personal history of other venous thrombosis and embolism: Secondary | ICD-10-CM | POA: Diagnosis not present

## 2023-04-05 ENCOUNTER — Other Ambulatory Visit: Payer: Self-pay | Admitting: Family Medicine

## 2023-04-06 NOTE — Telephone Encounter (Signed)
Requested Prescriptions  Pending Prescriptions Disp Refills   hydrALAZINE (APRESOLINE) 25 MG tablet [Pharmacy Med Name: hydrALAZINE HCl 25 MG Oral Tablet] 200 tablet 1    Sig: TAKE 1 TABLET BY MOUTH TWICE  DAILY     Cardiovascular:  Vasodilators Failed - 04/05/2023 10:18 PM      Failed - HCT in normal range and within 360 days    Hematocrit  Date Value Ref Range Status  11/03/2022 31.7 (L) 34.0 - 46.6 % Final         Failed - HGB in normal range and within 360 days    Hemoglobin  Date Value Ref Range Status  11/03/2022 9.9 (L) 11.1 - 15.9 g/dL Final         Failed - RBC in normal range and within 360 days    RBC  Date Value Ref Range Status  11/03/2022 3.47 (L) 3.77 - 5.28 x10E6/uL Final  04/15/2018 3.93 3.80 - 5.20 MIL/uL Final         Failed - ANA Screen, Ifa, Serum in normal range and within 360 days    Anti Nuclear Antibody(ANA)  Date Value Ref Range Status  07/24/2017 Negative Negative Final         Passed - WBC in normal range and within 360 days    WBC  Date Value Ref Range Status  11/03/2022 6.2 3.4 - 10.8 x10E3/uL Final  04/15/2018 4.8 3.6 - 11.0 K/uL Final         Passed - PLT in normal range and within 360 days    Platelets  Date Value Ref Range Status  11/03/2022 284 150 - 450 x10E3/uL Final         Passed - Last BP in normal range    BP Readings from Last 1 Encounters:  11/03/22 134/80         Passed - Valid encounter within last 12 months    Recent Outpatient Visits           5 months ago Aortic atherosclerosis (HCC)   [place] [facility] [place], Megan P, DO   11 months ago Routine general medical examination at a health care facility   [facility] [place], [place] P, DO   1 year ago Left leg pain   [place] [facility] [place], [place] P, DO   1 year ago Senile purpura [facility])   [place] [facility] [place], Megan P, DO   1 year ago Hamstring tear   [place] [facility] [place], Dorie Rank, NP       Future Appointments             In 1 month Johnson, Oralia Rud, DO [place] [facility], [facility]

## 2023-04-21 ENCOUNTER — Other Ambulatory Visit: Payer: Self-pay | Admitting: Family Medicine

## 2023-04-23 NOTE — Telephone Encounter (Signed)
Requested Prescriptions  Pending Prescriptions Disp Refills   benazepril (LOTENSIN) 40 MG tablet [Pharmacy Med Name: Benazepril HCl 40 MG Oral Tablet] 100 tablet 2    Sig: TAKE 1 TABLET BY MOUTH DAILY     Cardiovascular:  ACE Inhibitors Failed - 04/21/2023  7:50 AM      Failed - K in normal range and within 180 days    Potassium  Date Value Ref Range Status  11/03/2022 3.4 (L) 3.5 - 5.2 mmol/L Final  01/20/2013 4.0 3.5 - 5.1 mmol/L Final         Passed - Cr in normal range and within 180 days    Creatinine  Date Value Ref Range Status  01/20/2013 1.05 0.60 - 1.30 mg/dL Final   Creatinine, Ser  Date Value Ref Range Status  11/03/2022 0.72 0.57 - 1.00 mg/dL Final         Passed - Patient is not pregnant      Passed - Last BP in normal range    BP Readings from Last 1 Encounters:  11/03/22 134/80         Passed - Valid encounter within last 6 months    Recent Outpatient Visits           5 months ago Aortic atherosclerosis (HCC)   [place] [facility] [place], Megan P, DO   11 months ago Routine general medical examination at a health care facility   [facility] [place], [place] P, DO   1 year ago Left leg pain   [place] [facility] [place], [place] P, DO   1 year ago Senile purpura (HCC)   [place] [facility] [place], Megan P, DO   1 year ago Hamstring tear   [place] [facility] [place], Corrie Dandy T, NP       Future Appointments             In 2 weeks Laural Benes, Megan P, DO [place] [facility], [facility]             atenolol (TENORMIN) 50 MG tablet [Pharmacy Med Name: Atenolol 50 MG Oral Tablet] 100 tablet 2    Sig: TAKE 1 TABLET BY MOUTH DAILY     Cardiovascular: Beta Blockers 2 Passed - 04/21/2023  7:50 AM      Passed - Cr in normal range and within 360 days    Creatinine  Date Value Ref Range Status  01/20/2013 1.05 0.60 - 1.30 mg/dL Final   Creatinine, Ser   Date Value Ref Range Status  11/03/2022 0.72 0.57 - 1.00 mg/dL Final         Passed - Last BP in normal range    BP Readings from Last 1 Encounters:  11/03/22 134/80         Passed - Last Heart Rate in normal range    Pulse Readings from Last 1 Encounters:  11/03/22 76         Passed - Valid encounter within last 6 months    Recent Outpatient Visits           5 months ago Aortic atherosclerosis (HCC)   [place] [facility] [place], Megan P, DO   11 months ago Routine general medical examination at a health care facility   [facility] [place], [place] P, DO   1 year ago Left leg pain    [facility] [place], [place], DO  1 year ago Senile purpura (HCC)   [place] [facility] [place], Megan P, DO   1 year ago Hamstring tear   [place] [facility] [place], Corrie Dandy T, NP       Future Appointments             In 2 weeks Johnson, Megan P, DO [place] [facility], [facility]             amLODipine (NORVASC) 10 MG tablet [Pharmacy Med Name: amLODIPine Besylate 10 MG Oral Tablet] 100 tablet 2    Sig: TAKE 1 TABLET BY MOUTH DAILY     Cardiovascular: Calcium Channel Blockers 2 Passed - 04/21/2023  7:50 AM      Passed - Last BP in normal range    BP Readings from Last 1 Encounters:  11/03/22 134/80         Passed - Last Heart Rate in normal range    Pulse Readings from Last 1 Encounters:  11/03/22 76         Passed - Valid encounter within last 6 months    Recent Outpatient Visits           5 months ago Aortic atherosclerosis (HCC)   [place] [facility] [place], Megan P, DO   11 months ago Routine general medical examination at a health care facility   [facility] [place], [place] P, DO   1 year ago Left leg pain   [place] [facility] [place], [place] P, DO   1 year ago Senile purpura (HCC)   [place]  [facility] [place], Megan P, DO   1 year ago Hamstring tear    [facility] [place], Corrie Dandy T, NP       Future Appointments             In 2 weeks Laural Benes, Oralia Rud, DO  [facility], [facility]             atorvastatin (LIPITOR) 40 MG tablet [Pharmacy Med Name: Atorvastatin Calcium 40 MG Oral Tablet] 100 tablet 2    Sig: TAKE 1 TABLET BY MOUTH AT  BEDTIME     Cardiovascular:  Antilipid - Statins Failed - 04/21/2023  7:50 AM      Failed - Lipid Panel in normal range within the last 12 months    Cholesterol, Total  Date Value Ref Range Status  11/03/2022 127 100 - 199 mg/dL Final   Cholesterol Piccolo, Waived  Date Value Ref Range Status  06/21/2016 129 <200 mg/dL Final    Comment:                            Desirable                <200                         Borderline High      200- 239                         High                     >239    LDL Chol Calc (NIH)  Date Value Ref Range Status  11/03/2022 56 0 - 99 mg/dL Final   HDL  Date  Value Ref Range Status  11/03/2022 51 >39 mg/dL Final   Triglycerides  Date Value Ref Range Status  11/03/2022 113 0 - 149 mg/dL Final   Triglycerides Piccolo,Waived  Date Value Ref Range Status  06/21/2016 136 <150 mg/dL Final    Comment:                            Normal                   <150                         Borderline High     150 - 199                         High                200 - 499                         Very High                >499          Passed - Patient is not pregnant      Passed - Valid encounter within last 12 months    Recent Outpatient Visits           5 months ago Aortic atherosclerosis (HCC)   [place] [facility] [place], Megan P, DO   11 months ago Routine general medical examination at a health care facility   [facility] [place], [place] P, DO   1 year ago Left leg pain   [place]  [facility] [place], [place] P, DO   1 year ago Senile purpura [facility])   [place] [facility] [place], Megan P, DO   1 year ago Hamstring tear   [place] [facility] [place], Corrie Dandy T, NP       Future Appointments             In 2 weeks Laural Benes, Oralia Rud, DO [place] [facility]

## 2023-04-25 ENCOUNTER — Ambulatory Visit
Admission: RE | Admit: 2023-04-25 | Discharge: 2023-04-25 | Disposition: A | Discharge status: Home / Self Care | Payer: Medicare Other | Source: Ambulatory Visit | Attending: Surgery | Admitting: Surgery

## 2023-04-25 DIAGNOSIS — Z1231 Encounter for screening mammogram for malignant neoplasm of breast: Secondary | ICD-10-CM | POA: Diagnosis not present

## 2023-05-02 ENCOUNTER — Telehealth: Payer: Self-pay | Admitting: Family Medicine

## 2023-05-02 DIAGNOSIS — C50412 Malignant neoplasm of upper-outer quadrant of left female breast: Secondary | ICD-10-CM | POA: Diagnosis not present

## 2023-05-02 DIAGNOSIS — Z170 Estrogen receptor positive status [ER+]: Secondary | ICD-10-CM | POA: Diagnosis not present

## 2023-05-02 NOTE — Telephone Encounter (Signed)
Copied from CRM [id number]. Topic: Medicare AWV >> May 02, 2023  3:03 PM Payton Doughty wrote: Reason for CRM: LM 05/02/2023 to schedule AWV   Verlee Rossetti; Care Guide Ambulatory Clinical Support [place] [facility] Direct Dial: [phone]

## 2023-05-09 ENCOUNTER — Ambulatory Visit (INDEPENDENT_AMBULATORY_CARE_PROVIDER_SITE_OTHER): Payer: Medicare Other | Admitting: Family Medicine

## 2023-05-09 ENCOUNTER — Encounter: Payer: Self-pay | Admitting: Family Medicine

## 2023-05-09 VITALS — BP 133/75 | HR 64 | Ht 58.5 in | Wt 136.6 lb

## 2023-05-09 DIAGNOSIS — Z23 Encounter for immunization: Secondary | ICD-10-CM

## 2023-05-09 DIAGNOSIS — E782 Mixed hyperlipidemia: Secondary | ICD-10-CM

## 2023-05-09 DIAGNOSIS — D692 Other nonthrombocytopenic purpura: Secondary | ICD-10-CM

## 2023-05-09 DIAGNOSIS — C50812 Malignant neoplasm of overlapping sites of left female breast: Secondary | ICD-10-CM

## 2023-05-09 DIAGNOSIS — M1A9XX0 Chronic gout, unspecified, without tophus (tophi): Secondary | ICD-10-CM | POA: Diagnosis not present

## 2023-05-09 DIAGNOSIS — Z0000 Encounter for general adult medical examination without abnormal findings: Secondary | ICD-10-CM

## 2023-05-09 DIAGNOSIS — F411 Generalized anxiety disorder: Secondary | ICD-10-CM

## 2023-05-09 DIAGNOSIS — I2510 Atherosclerotic heart disease of native coronary artery without angina pectoris: Secondary | ICD-10-CM | POA: Diagnosis not present

## 2023-05-09 DIAGNOSIS — I129 Hypertensive chronic kidney disease with stage 1 through stage 4 chronic kidney disease, or unspecified chronic kidney disease: Secondary | ICD-10-CM

## 2023-05-09 DIAGNOSIS — Z636 Dependent relative needing care at home: Secondary | ICD-10-CM | POA: Insufficient documentation

## 2023-05-09 DIAGNOSIS — Z7189 Other specified counseling: Secondary | ICD-10-CM | POA: Insufficient documentation

## 2023-05-09 DIAGNOSIS — I700 Atherosclerosis of aorta: Secondary | ICD-10-CM

## 2023-05-09 LAB — MICROALBUMIN, URINE WAIVED
Creatinine, Urine Waived: 200 mg/dL (ref 10–300)
Microalb, Ur Waived: 30 mg/L — ABNORMAL HIGH (ref 0–19)
Microalb/Creat Ratio: <30 mg/g (ref <30)

## 2023-05-09 MED ORDER — ATENOLOL 50 MG PO TABS: 50.0000 mg | ORAL_TABLET | Freq: Every day | ORAL | 1 refills | Status: DC

## 2023-05-09 MED ORDER — HYDRALAZINE HCL 25 MG PO TABS: 25.0000 mg | ORAL_TABLET | Freq: Two times a day (BID) | ORAL | 1 refills | Status: DC

## 2023-05-09 MED ORDER — BENAZEPRIL HCL 40 MG PO TABS: 40.0000 mg | ORAL_TABLET | Freq: Every day | ORAL | 1 refills | Status: DC

## 2023-05-09 MED ORDER — AMLODIPINE BESYLATE 10 MG PO TABS: 10.0000 mg | ORAL_TABLET | Freq: Every day | ORAL | 1 refills | Status: DC

## 2023-05-09 MED ORDER — CLOBETASOL PROPIONATE 0.05 % EX OINT: TOPICAL_OINTMENT | CUTANEOUS | 1 refills | Status: AC

## 2023-05-09 MED ORDER — AMITRIPTYLINE HCL 25 MG PO TABS
25.0000 mg | ORAL_TABLET | Freq: Every day | ORAL | 1 refills | Status: DC
Start: 2023-05-09 — End: 2023-10-05

## 2023-05-09 MED ORDER — ATORVASTATIN CALCIUM 40 MG PO TABS: 40.0000 mg | ORAL_TABLET | Freq: Every day | ORAL | 1 refills | Status: DC

## 2023-05-09 MED ORDER — SERTRALINE HCL 100 MG PO TABS: 150.0000 mg | ORAL_TABLET | Freq: Every day | ORAL | 1 refills | Status: DC

## 2023-05-09 NOTE — Assessment & Plan Note (Signed)
Under good control on current regimen. Continue current regimen. Continue to monitor. Call with any concerns. Refills given. Labs drawn today.   

## 2023-05-09 NOTE — Assessment & Plan Note (Signed)
Will keep her BP and cholesterol under good control. Continue current regimen. Call with any concerns.

## 2023-05-09 NOTE — Progress Notes (Signed)
BP 133/75   Pulse 64   Ht 4' 10.5" (1.486 m)   Wt 136 lb 9.6 oz (62 kg)   SpO2 98%   BMI 28.06 kg/m    Subjective:    Patient ID: Marie Parker, female    DOB: 23-Jun-1946, 77 y.o.   MRN: [id number]  HPI: Marie Parker is a 77 y.o. female presenting on 05/09/2023 for comprehensive medical examination. Current medical complaints include:  Is not working any more, has been caring for her husband. Has been frustrated and has been not feeling entirely like herself.  HYPERTENSION / HYPERLIPIDEMIA Satisfied with current treatment? yes Duration of hypertension: chronic BP monitoring frequency: rarely BP medication side effects: no Past BP meds: amlodipine, benazepril, hydralazine, atenolol Duration of hyperlipidemia: chronic Cholesterol medication side effects: no Cholesterol supplements: niacin Past cholesterol medications: atorvastatin Medication compliance: excellent compliance Aspirin: no Recent stressors: yes Recurrent headaches: no Visual changes: no Palpitations: no Dyspnea: no Chest pain: no Lower extremity edema: no Dizzy/lightheaded: no  ANXIETY/DEPRESSION Duration: chronic Status:exacerbated Anxious mood: yes  Excessive worrying: yes Irritability: no  Sweating: no Nausea: no Palpitations:no Hyperventilation: no Panic attacks: no Agoraphobia: no  Obscessions/compulsions: no Depressed mood: no    05/09/2023    8:25 AM 11/03/2022    8:49 AM 05/05/2022    9:25 AM 04/10/2022   11:40 AM 10/25/2021    8:22 AM  Depression screen PHQ 2/9  Decreased Interest 1 1 0 0 0  Down, Depressed, Hopeless 1 0 0 0 0  PHQ - 2 Score 2 1 0 0 0  Altered sleeping 0 1 1  1   Tired, decreased energy 1 1 1  1   Change in appetite 0 0 0  0  Feeling bad or failure about yourself  0 0 0  0  Trouble concentrating 0 0 0  0  Moving slowly or fidgety/restless 0 1 0  0  Suicidal thoughts 0 0 0  0  PHQ-9 Score 3 4 2  2   Difficult doing work/chores Somewhat difficult  Not difficult at  all     Anhedonia: no Weight changes: no Insomnia: no   Hypersomnia: no Fatigue/loss of energy: yes Feelings of worthlessness: no Feelings of guilt: yes Impaired concentration/indecisiveness: no Suicidal ideations: no  Crying spells: no Recent Stressors/Life Changes: yes   Relationship problems: yes   Family stress: yes     Financial stress: no    Job stress: no    Recent death/loss: no   She currently lives with: husband Menopausal Symptoms: no  Functional Status Survey: Is the patient deaf or have difficulty hearing?: No Does the patient have difficulty seeing, even when wearing glasses/contacts?: Yes Does the patient have difficulty concentrating, remembering, or making decisions?: Yes Does the patient have difficulty walking or climbing stairs?: Yes Does the patient have difficulty dressing or bathing?: No Does the patient have difficulty doing errands alone such as visiting a doctor's office or shopping?: No     05/09/2023    8:25 AM 11/03/2022    8:50 AM 05/05/2022    9:26 AM 04/10/2022   11:34 AM 10/25/2021    8:22 AM  Fall Risk   Falls in the past year? 1 1 1 1 1   Number falls in past yr: 1 1 1 1  0  Injury with Fall? 0 1 1 1 1   Risk for fall due to : History of fall(s) History of fall(s) History of fall(s)  History of fall(s)  Follow up Falls evaluation completed Falls evaluation  completed Falls evaluation completed Falls evaluation completed;Education provided;Falls prevention discussed Falls evaluation completed    Depression Screen    05/09/2023    8:25 AM 11/03/2022    8:49 AM 05/05/2022    9:25 AM 04/10/2022   11:40 AM 10/25/2021    8:22 AM  Depression screen PHQ 2/9  Decreased Interest 1 1 0 0 0  Down, Depressed, Hopeless 1 0 0 0 0  PHQ - 2 Score 2 1 0 0 0  Altered sleeping 0 1 1  1   Tired, decreased energy 1 1 1  1   Change in appetite 0 0 0  0  Feeling bad or failure about yourself  0 0 0  0  Trouble concentrating 0 0 0  0  Moving slowly or  fidgety/restless 0 1 0  0  Suicidal thoughts 0 0 0  0  PHQ-9 Score 3 4 2  2   Difficult doing work/chores Somewhat difficult  Not difficult at all       Advanced Directives Does patient have a HCPOA?    yes Does patient have a living will or MOST form?  yes  Past Medical History:  Past Medical History:  Diagnosis Date   Anxiety    Arthritis    Breast cancer (HCC)    Breast cancer of upper-outer quadrant of left female breast (HCC) 04/22/2018   16 mm, T1c, N0.  ER/PR positive, HER-2/neu not overexpressed.  Low MammaPrint score.     CAD (coronary artery disease)    Cancer (HCC)    squamous cell   Cataract    Cholecystitis 04/10/2018   Chronic kidney disease    Colon cancer screening 2017?   negative cologuard   Depression    Family history of adverse reaction to anesthesia    mother had N/V   Generalized osteoarthritis    GERD (gastroesophageal reflux disease)    Gout    History of endoscopy 11/2012   Gastritis   Hyperlipidemia    Hypertension    IFG (impaired fasting glucose)    Insomnia    Menopause    Morton's neuroma of right foot 11/28/2019   Osteoporosis    Parathyroid adenoma    Personal history of radiation therapy 2020   Left breast   Personal history of tobacco use, presenting hazards to health 02/02/2016   PVD (peripheral vascular disease) (HCC)    PVD (peripheral vascular disease) (HCC)    Rotator cuff syndrome of shoulder and allied disorders    Tobacco abuse     Surgical History:  Past Surgical History:  Procedure Laterality Date   AIKEN OSTEOTOMY Right 06/11/2020   Procedure: DOUBLE OSTEOTOMY RIGHT;  Surgeon: Gwyneth Revels, DPM;  Location: [facility] ORS;  Service: Podiatry;  Laterality: Right;   BREAST BIOPSY Left 2014   COMPLEX FIBROADENOMA   BREAST BIOPSY Left 04/05/2018   [facility]   BREAST EXCISIONAL BIOPSY Left 1970's   neg   BREAST FIBROADENOMA SURGERY     BREAST LUMPECTOMY     BREAST MAMMOSITE Left 04/2018   CARDIAC CATHETERIZATION  09/17/2007    Insignificant CAD with 40-50% stenosis of mid LAD.    CATARACT EXTRACTION, BILATERAL  12/2020   COLONOSCOPY WITH PROPOFOL N/A 02/09/2020   Procedure: COLONOSCOPY WITH PROPOFOL;  Surgeon: Toney Reil, MD;  Location: [facility] ENDOSCOPY;  Service: Gastroenterology;  Laterality: N/A;   ESOPHAGOGASTRODUODENOSCOPY (EGD) WITH PROPOFOL N/A 02/09/2020   Procedure: ESOPHAGOGASTRODUODENOSCOPY (EGD) WITH PROPOFOL;  Surgeon: Toney Reil, MD;  Location: [facility] ENDOSCOPY;  Service:  Gastroenterology;  Laterality: N/A;   EXCISION MORTON'S NEUROMA Right 06/11/2020   Procedure: EXCISION MORTON'S NEUROMA;  Surgeon: Gwyneth Revels, DPM;  Location: [facility];  Service: Podiatry;  Laterality: Right;   HAMMER TOE SURGERY Right 06/11/2020   Procedure: HAMMER TOE CORRECTION T6;  Surgeon: Gwyneth Revels, DPM;  Location: [facility] OR[facility];  Service: Podiatry;  Laterality: Right;   ILIAC ARTERY STENT     right iliac artery stent    MASTECTOMY, PARTIAL Left 04/22/2018   Procedure: MASTECTOMY PARTIAL;  Surgeon: Earline Mayotte, MD;  Location: [facility] ORS;  Service: General;  Laterality: Left;   ORIF PATELLA Left 03/22/2016   Dr. Edwena Blow   PARATHYROIDECTOMY  2012   [facility]   SENTINEL NODE BIOPSY Left 04/22/2018   Procedure: SENTINEL NODE BIOPSY;  Surgeon: Earline Mayotte, MD;  Location: [facility] ORS;  Service: General;  Laterality: Left;   SKIN BIOPSY     precancerous    Medications:  Current Outpatient Medications on File Prior to Visit  Medication Sig   acetaminophen (TYLENOL) 650 MG CR tablet Take 1,300 mg by mouth daily as needed for pain.    CALCIUM-VITAMIN D PO Take 2 tablets by mouth daily.   clonazePAM (KLONOPIN) 0.5 MG tablet Take 0.5-1 tablets (0.25-0.5 mg total) by mouth 2 (two) times daily as needed for anxiety.   fluticasone (FLONASE) 50 MCG/ACT nasal spray Place 2 sprays into both nostrils daily.   methocarbamol (ROBAXIN) 500 MG tablet Take by mouth.   Multiple Vitamin (MULTIVITAMIN WITH MINERALS) TABS  tablet Take 1 tablet by mouth daily.   mupirocin ointment (BACTROBAN) 2 % Apply 1 Application topically 2 (two) times daily.   niacin 500 MG tablet Take 1,000 mg by mouth at bedtime.   omeprazole (PRILOSEC) 40 MG capsule TAKE 1 CAPSULE BY MOUTH  TWICE DAILY BEFORE MEALS   Probiotic Product (PROBIOTIC-10 PO) Take by mouth daily.    UNABLE TO FIND Med Name: QC-10   No current facility-administered medications on file prior to visit.    Allergies:  Allergies  Allergen Reactions   Alendronate Nausea Only and Other (See Comments)    Other reaction(s): NAUSEA GI upset   Amoxicillin-Pot Clavulanate Diarrhea   Crestor [Rosuvastatin] Nausea Only   Cyclobenzaprine Other (See Comments)    Vertigo, couldn't focus eyes   Erythromycin Other (See Comments)    GI upset    Fosamax [Alendronate Sodium] Nausea And Vomiting    Social History:  Social History   Socioeconomic History   Marital status: Married    Spouse name: Not on file   Number of children: Not on file   Years of education: Not on file   Highest education level: High school graduate  Occupational History   Occupation: part time [employer]   Tobacco Use   Smoking status: Former    Current packs/day: 0.00    Average packs/day: 1 pack/day for 42.0 years (42.0 ttl pk-yrs)    Types: Cigarettes    Start date: 08/28/1958    Quit date: 08/28/2000    Years since quitting: 22.7   Smokeless tobacco: Never  Vaping Use   Vaping status: Never Used  Substance and Sexual Activity   Alcohol use: No   Drug use: No   Sexual activity: Not Currently  Other Topics Concern   Not on file  Social History Narrative   Working part time   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    Social Determinants of Health   Financial Resource Strain: Low Risk  (  04/10/2022)   Overall Financial Resource Strain (CARDIA)    Difficulty of Paying Living Expenses: Not hard at all  Food Insecurity: No Food Insecurity (04/10/2022)   Hunger Vital Sign     Worried About Running Out of Food in the Last Year: Never true    Ran Out of Food in the Last Year: Never true  Transportation Needs: No Transportation Needs (04/10/2022)   PRAPARE - Administrator, Civil Service (Medical): No    Lack of Transportation (Non-Medical): No  Physical Activity: Inactive (04/10/2022)   Exercise Vital Sign    Days of Exercise per Week: 0 days    Minutes of Exercise per Session: 0 min  Stress: No Stress Concern Present (04/10/2022)   [employer] of Occupational Health - Occupational Stress Questionnaire    Feeling of Stress : Not at all  Social Connections: Moderately Integrated (04/10/2022)   Social Connection and Isolation Panel [NHANES]    Frequency of Communication with Friends and Family: More than three times a week    Frequency of Social Gatherings with Friends and Family: Never    Attends Religious Services: More than 4 times per year    Active Member of [employer] or Organizations: No    Attends Banker Meetings: Never    Marital Status: Married  Catering manager Violence: Not At Risk (04/10/2022)   Humiliation, Afraid, Rape, and Kick questionnaire    Fear of Current or Ex-Partner: No    Emotionally Abused: No    Physically Abused: No    Sexually Abused: No   Social History   Tobacco Use  Smoking Status Former   Current packs/day: 0.00   Average packs/day: 1 pack/day for 42.0 years (42.0 ttl pk-yrs)   Types: Cigarettes   Start date: 08/28/1958   Quit date: 08/28/2000   Years since quitting: 22.7  Smokeless Tobacco Never   Social History   Substance and Sexual Activity  Alcohol Use No    Family History:  Family History  Problem Relation Age of Onset   Stroke Mother    Hypertension Mother    Diabetes Mother    Heart attack Father 12   Alcohol abuse Father    Heart disease Father    Liver disease Father    Hypertension Father    Heart disease Sister 61       CAD; s/p stent placement   Stroke Sister    AAA  (abdominal aortic aneurysm) Sister    Dementia Sister    Breast cancer Maternal Aunt 69   Bone cancer Paternal Uncle    Stroke Maternal Grandmother    Heart attack Maternal Grandfather    Stroke Paternal Grandmother    Breast cancer Cousin        mat cousin    Past medical history, surgical history, medications, allergies, family history and social history reviewed with patient today and changes made to appropriate areas of the chart.   Review of Systems  Constitutional:  Positive for diaphoresis. Negative for chills, fever, malaise/fatigue and weight loss.  HENT:  Positive for congestion and ear pain. Negative for ear discharge, hearing loss, nosebleeds, sinus pain, sore throat and tinnitus.   Eyes:  Positive for blurred vision. Negative for double vision, photophobia, pain, discharge and redness.  Respiratory:  Positive for cough. Negative for hemoptysis, sputum production, shortness of breath, wheezing and stridor.   Cardiovascular: Negative.   Gastrointestinal:  Positive for heartburn. Negative for abdominal pain, blood in stool, constipation, diarrhea, melena,  nausea and vomiting.  Genitourinary: Negative.   Musculoskeletal:  Positive for joint pain. Negative for back pain, falls, myalgias and neck pain.  Skin: Negative.   Neurological: Negative.   Endo/Heme/Allergies:  Positive for environmental allergies. Negative for polydipsia. Does not bruise/bleed easily.  Psychiatric/Behavioral:  Positive for depression. Negative for hallucinations, memory loss, substance abuse and suicidal ideas. The patient is nervous/anxious. The patient does not have insomnia.     All other ROS negative except what is listed above and in the HPI.      Objective:    BP 133/75   Pulse 64   Ht 4' 10.5" (1.486 m)   Wt 136 lb 9.6 oz (62 kg)   SpO2 98%   BMI 28.06 kg/m   Wt Readings from Last 3 Encounters:  05/09/23 136 lb 9.6 oz (62 kg)  11/03/22 142 lb 8 oz (64.6 kg)  08/10/22 138 lb (62.6 kg)     Physical Exam Vitals and nursing note reviewed.  Constitutional:      General: She is not in acute distress.    Appearance: Normal appearance. She is not ill-appearing, toxic-appearing or diaphoretic.  HENT:     Head: Normocephalic and atraumatic.     Right Ear: Tympanic membrane, ear canal and external ear normal. There is no impacted cerumen.     Left Ear: Tympanic membrane, ear canal and external ear normal. There is no impacted cerumen.     Nose: Nose normal. No congestion or rhinorrhea.     Mouth/Throat:     Mouth: Mucous membranes are moist.     Pharynx: Oropharynx is clear. No oropharyngeal exudate or posterior oropharyngeal erythema.  Eyes:     General: No scleral icterus.       Right eye: No discharge.        Left eye: No discharge.     Extraocular Movements: Extraocular movements intact.     Conjunctiva/sclera: Conjunctivae normal.     Pupils: Pupils are equal, round, and reactive to light.  Neck:     Vascular: No carotid bruit.  Cardiovascular:     Rate and Rhythm: Normal rate and regular rhythm.     Pulses: Normal pulses.     Heart sounds: No murmur heard.    No friction rub. No gallop.  Pulmonary:     Effort: Pulmonary effort is normal. No respiratory distress.     Breath sounds: Normal breath sounds. No stridor. No wheezing, rhonchi or rales.  Chest:     Chest wall: No tenderness.  Abdominal:     General: Abdomen is flat. Bowel sounds are normal. There is no distension.     Palpations: Abdomen is soft. There is no mass.     Tenderness: There is no abdominal tenderness. There is no right CVA tenderness, left CVA tenderness, guarding or rebound.     Hernia: No hernia is present.  Genitourinary:    Comments: Breast and pelvic exams deferred with shared decision making Musculoskeletal:        General: No swelling, tenderness, deformity or signs of injury.     Cervical back: Normal range of motion and neck supple. No rigidity. No muscular tenderness.     Right  lower leg: No edema.     Left lower leg: No edema.  Lymphadenopathy:     Cervical: No cervical adenopathy.  Skin:    General: Skin is warm and dry.     Capillary Refill: Capillary refill takes less than 2 seconds.  Coloration: Skin is not jaundiced or pale.     Findings: No bruising, erythema, lesion or rash.  Neurological:     General: No focal deficit present.     Mental Status: She is alert and oriented to person, place, and time. Mental status is at baseline.     Cranial Nerves: No cranial nerve deficit.     Sensory: No sensory deficit.     Motor: No weakness.     Coordination: Coordination normal.     Gait: Gait normal.     Deep Tendon Reflexes: Reflexes normal.  Psychiatric:        Mood and Affect: Mood normal.        Behavior: Behavior normal.        Thought Content: Thought content normal.        Judgment: Judgment normal.        05/09/2023    8:53 AM 04/10/2022   11:36 AM 04/08/2021   11:21 AM 04/05/2020   11:23 AM 01/01/2019    3:29 PM  6CIT Screen  What Year? 0 points 0 points 0 points 0 points 0 points  What month? 0 points 0 points 0 points 0 points 0 points  What time? 0 points 0 points 0 points 0 points 0 points  Count back from 20 4 points 0 points 0 points 0 points 0 points  Months in reverse 0 points 0 points 0 points 0 points 0 points  Repeat phrase 2 points 0 points 0 points 4 points 0 points  Total Score 6 points 0 points 0 points 4 points 0 points     Results for orders placed or performed in visit on 05/09/23  Microalbumin, Urine Waived  Result Value Ref Range   Microalb, Ur Waived 30 (H) 0 - 19 mg/L   Creatinine, Urine Waived 200 10 - 300 mg/dL   Microalb/Creat Ratio <30 <30 mg/g      Assessment & Plan:   Problem List Items Addressed This Visit       Cardiovascular and Mediastinum   CAD (coronary artery disease)    Will keep her BP and cholesterol under good control. Continue current regimen. Call with any concerns.       Relevant  Medications   benazepril (LOTENSIN) 40 MG tablet   atorvastatin (LIPITOR) 40 MG tablet   atenolol (TENORMIN) 50 MG tablet   amLODipine (NORVASC) 10 MG tablet   hydrALAZINE (APRESOLINE) 25 MG tablet   Senile purpura (HCC)    Reassured patient. Continue to monitor.      Relevant Medications   benazepril (LOTENSIN) 40 MG tablet   atorvastatin (LIPITOR) 40 MG tablet   atenolol (TENORMIN) 50 MG tablet   amLODipine (NORVASC) 10 MG tablet   hydrALAZINE (APRESOLINE) 25 MG tablet   Other Relevant Orders   CBC with Differential/Platelet   Comprehensive metabolic panel   Aortic atherosclerosis (HCC)    Will keep her BP and cholesterol under good control. Continue current regimen. Call with any concerns.       Relevant Medications   benazepril (LOTENSIN) 40 MG tablet   atorvastatin (LIPITOR) 40 MG tablet   atenolol (TENORMIN) 50 MG tablet   amLODipine (NORVASC) 10 MG tablet   hydrALAZINE (APRESOLINE) 25 MG tablet   Other Relevant Orders   Comprehensive metabolic panel     Genitourinary   Benign hypertensive renal disease    Under good control on current regimen. Continue current regimen. Continue to monitor. Call with any concerns. Refills given. Labs  drawn today.       Relevant Orders   Comprehensive metabolic panel   TSH   Microalbumin, Urine Waived (Completed)     Other   Hyperlipidemia    Under good control on current regimen. Continue current regimen. Continue to monitor. Call with any concerns. Refills given. Labs drawn today.       Relevant Medications   benazepril (LOTENSIN) 40 MG tablet   atorvastatin (LIPITOR) 40 MG tablet   atenolol (TENORMIN) 50 MG tablet   amLODipine (NORVASC) 10 MG tablet   hydrALAZINE (APRESOLINE) 25 MG tablet   Other Relevant Orders   Comprehensive metabolic panel   Lipid Panel w/o Chol/HDL Ratio   Gout    Under good control on current regimen. Continue current regimen. Continue to monitor. Call with any concerns. Refills given. Labs  drawn today.       Relevant Orders   Comprehensive metabolic panel   Uric acid   GAD (generalized anxiety disorder)   Relevant Medications   amitriptyline (ELAVIL) 25 MG tablet   sertraline (ZOLOFT) 100 MG tablet   Caregiver stress   Advance directive discussed with patient   Other Visit Diagnoses     Encounter for Medicare annual wellness exam    -  Primary   Preventative care discussed today as below.   Routine general medical examination at a health care facility       Vaccines up to date. Screening labs checked today. DEXA up to date. Continue diet and exercise. Call with any concerns.   Needs flu shot       Relevant Orders   Flu Vaccine Trivalent High Dose (Fluad) (Completed)        Preventative Services:  Health Risk Assessment and Personalized Prevention Plan: Done today Bone Mass Measurements: Up to date Breast Cancer Screening: Up to date CVD Screening: Done today Cervical Cancer Screening: N/A Colon Cancer Screening: Up to date Depression Screening: Done today Diabetes Screening: Done today Glaucoma Screening: See your eye doctor Hepatitis B vaccine: N/A Hepatitis C screening: Up to date HIV Screening: up to date Flu Vaccine: Given today Lung cancer Screening: N/A Obesity Screening: Done today Pneumonia Vaccines (2): Up to date STI Screening: N/A  Follow up plan: Return for Please cancel appt with Gunnar Fusi, Follow up with me 6 months.   LABORATORY TESTING:  - Pap smear: not applicable  IMMUNIZATIONS:   - Tdap: Tetanus vaccination status reviewed: last tetanus booster within 10 years. - Influenza: Administered today - Pneumovax: Up to date - Prevnar: Up to date - Zostavax vaccine: Given elsewhere  SCREENING: -Mammogram: Up to date  - Colonoscopy: Up to date  - Bone Density: Up to date    PATIENT COUNSELING:   Advised to take 1 mg of folate supplement per day if capable of pregnancy.   Sexuality: Discussed sexually transmitted diseases,  partner selection, use of condoms, avoidance of unintended pregnancy  and contraceptive alternatives.   Advised to avoid cigarette smoking.  I discussed with the patient that most people either abstain from alcohol or drink within safe limits (<=14/week and <=4 drinks/occasion for males, <=7/weeks and <= 3 drinks/occasion for females) and that the risk for alcohol disorders and other health effects rises proportionally with the number of drinks per week and how often a drinker exceeds daily limits.  Discussed cessation/primary prevention of drug use and availability of treatment for abuse.   Diet: Encouraged to adjust caloric intake to maintain  or achieve ideal body weight, to reduce intake  of dietary saturated fat and total fat, to limit sodium intake by avoiding high sodium foods and not adding table salt, and to maintain adequate dietary potassium and calcium preferably from fresh fruits, vegetables, and low-fat dairy products.    stressed the importance of regular exercise  Injury prevention: Discussed safety belts, safety helmets, smoke detector, smoking near bedding or upholstery.   Dental health: Discussed importance of regular tooth brushing, flossing, and dental visits.    NEXT PREVENTATIVE PHYSICAL DUE IN 1 YEAR. Return for Please cancel appt with Gunnar Fusi, Follow up with me 6 months.

## 2023-05-09 NOTE — Assessment & Plan Note (Signed)
Still not doing well. Continue current regimen. Call with any concerns. Continue to monitor.

## 2023-05-09 NOTE — Assessment & Plan Note (Signed)
Continue to follow with oncology. Call with any concerns. Continue to monitor.  

## 2023-05-09 NOTE — Assessment & Plan Note (Signed)
Patient has advanced directive and will bring in a copy for her chart.

## 2023-05-09 NOTE — Assessment & Plan Note (Signed)
Reassured patient. Continue to monitor.  

## 2023-05-09 NOTE — Patient Instructions (Signed)
Preventative Services:  Health Risk Assessment and Personalized Prevention Plan: Done today Bone Mass Measurements: Up to date Breast Cancer Screening: Up to date CVD Screening: Done today Cervical Cancer Screening: N/A Colon Cancer Screening: Up to date Depression Screening: Done today Diabetes Screening: Done today Glaucoma Screening: See your eye doctor Hepatitis B vaccine: N/A Hepatitis C screening: Up to date HIV Screening: up to date Flu Vaccine: Given today Lung cancer Screening: N/A Obesity Screening: Done today Pneumonia Vaccines (2): Up to date STI Screening: N/A

## 2023-05-10 LAB — COMPREHENSIVE METABOLIC PANEL
ALT: 21 IU/L (ref 0–32)
AST: 25 IU/L (ref 0–40)
Albumin: 4.4 g/dL (ref 3.8–4.8)
Alkaline Phosphatase: 95 IU/L (ref 44–121)
BUN/Creatinine Ratio: 11 — ABNORMAL LOW (ref 12–28)
BUN: 11 mg/dL (ref 8–27)
Bilirubin Total: 0.2 mg/dL (ref 0.0–1.2)
CO2: 23 mmol/L (ref 20–29)
Calcium: 9.5 mg/dL (ref 8.7–10.3)
Chloride: 102 mmol/L (ref 96–106)
Creatinine, Ser: 0.99 mg/dL (ref 0.57–1.00)
Globulin, Total: 2.2 g/dL (ref 1.5–4.5)
Glucose: 102 mg/dL — ABNORMAL HIGH (ref 70–99)
Potassium: 4.4 mmol/L (ref 3.5–5.2)
Sodium: 142 mmol/L (ref 134–144)
Total Protein: 6.6 g/dL (ref 6.0–8.5)
eGFR: 59 mL/min/{1.73_m2} — ABNORMAL LOW (ref >59)

## 2023-05-10 LAB — CBC WITH DIFFERENTIAL/PLATELET
Basophils Absolute: 0 10*3/uL (ref 0.0–0.2)
Basos: 1 %
EOS (ABSOLUTE): 0.3 10*3/uL (ref 0.0–0.4)
Eos: 5 %
Hematocrit: 39.9 % (ref 34.0–46.6)
Hemoglobin: 12.3 g/dL (ref 11.1–15.9)
Immature Grans (Abs): 0 10*3/uL (ref 0.0–0.1)
Immature Granulocytes: 0 %
Lymphocytes Absolute: 1.4 10*3/uL (ref 0.7–3.1)
Lymphs: 24 %
MCH: 28.6 pg (ref 26.6–33.0)
MCHC: 30.8 g/dL — ABNORMAL LOW (ref 31.5–35.7)
MCV: 93 fL (ref 79–97)
Monocytes Absolute: 0.4 10*3/uL (ref 0.1–0.9)
Monocytes: 7 %
Neutrophils Absolute: 3.6 10*3/uL (ref 1.4–7.0)
Neutrophils: 63 %
Platelets: 215 10*3/uL (ref 150–450)
RBC: 4.3 x10E6/uL (ref 3.77–5.28)
RDW: 13.8 % (ref 11.7–15.4)
WBC: 5.6 10*3/uL (ref 3.4–10.8)

## 2023-05-10 LAB — LIPID PANEL W/O CHOL/HDL RATIO
Cholesterol, Total: 137 mg/dL (ref 100–199)
HDL: 49 mg/dL (ref >39)
LDL Chol Calc (NIH): 62 mg/dL (ref 0–99)
Triglycerides: 152 mg/dL — ABNORMAL HIGH (ref 0–149)
VLDL Cholesterol Cal: 26 mg/dL (ref 5–40)

## 2023-05-10 LAB — URIC ACID: Uric Acid: 6.4 mg/dL (ref 3.1–7.9)

## 2023-05-10 LAB — TSH: TSH: 2.96 u[IU]/mL (ref 0.450–4.500)

## 2023-05-22 ENCOUNTER — Ambulatory Visit (INDEPENDENT_AMBULATORY_CARE_PROVIDER_SITE_OTHER): Payer: Medicare Other

## 2023-05-22 DIAGNOSIS — Z23 Encounter for immunization: Secondary | ICD-10-CM

## 2023-05-31 DIAGNOSIS — L249 Irritant contact dermatitis, unspecified cause: Secondary | ICD-10-CM | POA: Diagnosis not present

## 2023-06-05 DIAGNOSIS — M65331 Trigger finger, right middle finger: Secondary | ICD-10-CM | POA: Diagnosis not present

## 2023-06-05 DIAGNOSIS — M67441 Ganglion, right hand: Secondary | ICD-10-CM | POA: Diagnosis not present

## 2023-06-19 DIAGNOSIS — L249 Irritant contact dermatitis, unspecified cause: Secondary | ICD-10-CM | POA: Diagnosis not present

## 2023-06-19 DIAGNOSIS — Z859 Personal history of malignant neoplasm, unspecified: Secondary | ICD-10-CM | POA: Diagnosis not present

## 2023-06-19 DIAGNOSIS — L820 Inflamed seborrheic keratosis: Secondary | ICD-10-CM | POA: Diagnosis not present

## 2023-06-19 DIAGNOSIS — D485 Neoplasm of uncertain behavior of skin: Secondary | ICD-10-CM | POA: Diagnosis not present

## 2023-06-19 DIAGNOSIS — Z872 Personal history of diseases of the skin and subcutaneous tissue: Secondary | ICD-10-CM | POA: Diagnosis not present

## 2023-06-19 DIAGNOSIS — L578 Other skin changes due to chronic exposure to nonionizing radiation: Secondary | ICD-10-CM | POA: Diagnosis not present

## 2023-08-14 ENCOUNTER — Encounter: Payer: Self-pay | Admitting: Family Medicine

## 2023-08-14 ENCOUNTER — Ambulatory Visit (INDEPENDENT_AMBULATORY_CARE_PROVIDER_SITE_OTHER): Payer: Medicare Other | Admitting: Family Medicine

## 2023-08-14 VITALS — BP 134/80 | HR 66 | Wt 138.8 lb

## 2023-08-14 DIAGNOSIS — R0981 Nasal congestion: Secondary | ICD-10-CM | POA: Diagnosis not present

## 2023-08-14 DIAGNOSIS — R194 Change in bowel habit: Secondary | ICD-10-CM | POA: Diagnosis not present

## 2023-08-14 NOTE — Progress Notes (Signed)
BP 134/80   Pulse 66   Wt 138 lb 12.8 oz (63 kg)   SpO2 98%   BMI 28.52 kg/m    Subjective:    Patient ID: Marie Parker, female    DOB: [birth year], 77 y.o.   MRN: [id number]  HPI: Marie Parker is a 77 y.o. female  Chief Complaint  Patient presents with   GI Problem    Patient says she has always had ongoing stomach issues. Patient says she has noticed changes in her stool patterns. Patient says she typically 2-3 times a day and now she is having a harder time going, but the stool is very soft. Patient says she does not have the best diet at the moment.    Used to go 2-3x a day with blow outs. Now going 1x a day with soft stools.   ABDOMINAL ISSUES Duration: 6 weeks, started after "eating junk" because she's been under a lot of stress Nature: No pain, no bloating, no other symptoms Severity:  0/0   Treatments attempted: miralax Constipation: no Diarrhea: no Mucous in the stool: no Heartburn: no Bloating:no Flatulence: yes Nausea: no Vomiting: no Melena or hematochezia: no Rash: no Jaundice: no Fever: no Weight loss: no  Relevant past medical, surgical, family and social history reviewed and updated as indicated. Interim medical history since our last visit reviewed. Allergies and medications reviewed and updated.  Review of Systems  Constitutional: Negative.   Respiratory: Negative.    Cardiovascular: Negative.   Gastrointestinal: Negative.  Negative for abdominal distention, abdominal pain, anal bleeding, blood in stool, constipation, diarrhea, nausea, rectal pain and vomiting.  Musculoskeletal: Negative.   Psychiatric/Behavioral: Negative.      Per HPI unless specifically indicated above     Objective:    BP 134/80   Pulse 66   Wt 138 lb 12.8 oz (63 kg)   SpO2 98%   BMI 28.52 kg/m   Wt Readings from Last 3 Encounters:  08/14/23 138 lb 12.8 oz (63 kg)  05/09/23 136 lb 9.6 oz (62 kg)  11/03/22 142 lb 8 oz (64.6 kg)    Physical Exam Vitals  and nursing note reviewed.  Constitutional:      General: She is not in acute distress.    Appearance: Normal appearance. She is not ill-appearing, toxic-appearing or diaphoretic.  HENT:     Head: Normocephalic and atraumatic.     Right Ear: External ear normal.     Left Ear: External ear normal.     Nose: Nose normal.     Mouth/Throat:     Mouth: Mucous membranes are moist.     Pharynx: Oropharynx is clear.  Eyes:     General: No scleral icterus.       Right eye: No discharge.        Left eye: No discharge.     Extraocular Movements: Extraocular movements intact.     Conjunctiva/sclera: Conjunctivae normal.     Pupils: Pupils are equal, round, and reactive to light.  Cardiovascular:     Rate and Rhythm: Normal rate and regular rhythm.     Pulses: Normal pulses.     Heart sounds: Normal heart sounds. No murmur heard.    No friction rub. No gallop.  Pulmonary:     Effort: Pulmonary effort is normal. No respiratory distress.     Breath sounds: Normal breath sounds. No stridor. No wheezing, rhonchi or rales.  Chest:     Chest wall: No tenderness.  Abdominal:  General: Abdomen is flat. Bowel sounds are normal. There is no distension.     Palpations: Abdomen is soft. There is no mass.     Tenderness: There is no abdominal tenderness. There is no right CVA tenderness, left CVA tenderness, guarding or rebound.     Hernia: No hernia is present.  Musculoskeletal:        General: Normal range of motion.     Cervical back: Normal range of motion and neck supple.  Skin:    General: Skin is warm and dry.     Capillary Refill: Capillary refill takes less than 2 seconds.     Coloration: Skin is not jaundiced or pale.     Findings: No bruising, erythema, lesion or rash.  Neurological:     General: No focal deficit present.     Mental Status: She is alert and oriented to person, place, and time. Mental status is at baseline.  Psychiatric:        Mood and Affect: Mood normal.         Behavior: Behavior normal.        Thought Content: Thought content normal.        Judgment: Judgment normal.     Results for orders placed or performed in visit on 05/09/23  Microalbumin, Urine Waived   Collection Time: 05/09/23  8:30 AM  Result Value Ref Range   Microalb, Ur Waived 30 (H) 0 - 19 mg/L   Creatinine, Urine Waived 200 10 - 300 mg/dL   Microalb/Creat Ratio <30 <30 mg/g  CBC with Differential/Platelet   Collection Time: 05/09/23  8:31 AM  Result Value Ref Range   WBC 5.6 3.4 - 10.8 x10E3/uL   RBC 4.30 3.77 - 5.28 x10E6/uL   Hemoglobin 12.3 11.1 - 15.9 g/dL   Hematocrit 32.4 40.1 - 46.6 %   MCV 93 79 - 97 fL   MCH 28.6 26.6 - 33.0 pg   MCHC 30.8 (L) 31.5 - 35.7 g/dL   RDW 02.7 25.3 - 66.4 %   Platelets 215 150 - 450 x10E3/uL   Neutrophils 63 Not Estab. %   Lymphs 24 Not Estab. %   Monocytes 7 Not Estab. %   Eos 5 Not Estab. %   Basos 1 Not Estab. %   Neutrophils Absolute 3.6 1.4 - 7.0 x10E3/uL   Lymphocytes Absolute 1.4 0.7 - 3.1 x10E3/uL   Monocytes Absolute 0.4 0.1 - 0.9 x10E3/uL   EOS (ABSOLUTE) 0.3 0.0 - 0.4 x10E3/uL   Basophils Absolute 0.0 0.0 - 0.2 x10E3/uL   Immature Granulocytes 0 Not Estab. %   Immature Grans (Abs) 0.0 0.0 - 0.1 x10E3/uL  Comprehensive metabolic panel   Collection Time: 05/09/23  8:31 AM  Result Value Ref Range   Glucose 102 (H) 70 - 99 mg/dL   BUN 11 8 - 27 mg/dL   Creatinine, Ser 4.03 0.57 - 1.00 mg/dL   eGFR 59 (L) >47 QQ/VZD/6.38   BUN/Creatinine Ratio 11 (L) 12 - 28   Sodium 142 134 - 144 mmol/L   Potassium 4.4 3.5 - 5.2 mmol/L   Chloride 102 96 - 106 mmol/L   CO2 23 20 - 29 mmol/L   Calcium 9.5 8.7 - 10.3 mg/dL   Total Protein 6.6 6.0 - 8.5 g/dL   Albumin 4.4 3.8 - 4.8 g/dL   Globulin, Total 2.2 1.5 - 4.5 g/dL   Bilirubin Total 0.2 0.0 - 1.2 mg/dL   Alkaline Phosphatase 95 44 - 121 IU/L   AST  25 0 - 40 IU/L   ALT 21 0 - 32 IU/L  Lipid Panel w/o Chol/HDL Ratio   Collection Time: 05/09/23  8:31 AM  Result Value Ref  Range   Cholesterol, Total 137 100 - 199 mg/dL   Triglycerides 638 (H) 0 - 149 mg/dL   HDL 49 >75 mg/dL   VLDL Cholesterol Cal 26 5 - 40 mg/dL   LDL Chol Calc (NIH) 62 0 - 99 mg/dL  TSH   Collection Time: 05/09/23  8:31 AM  Result Value Ref Range   TSH 2.960 0.450 - 4.500 uIU/mL  Uric acid   Collection Time: 05/09/23  8:31 AM  Result Value Ref Range   Uric Acid 6.4 3.1 - 7.9 mg/dL      Assessment & Plan:   Problem List Items Addressed This Visit   None Visit Diagnoses       Change in bowel habits    -  Primary   No pain. No weight loss. Has had change in diet and stress. Will start fiber supplements and recheck at follow up. Call with any concerns.     Nasal congestion       Encouraged her to use her flonase. Call with any concerns. Continue to monitor.        Follow up plan: Return for As scheduled.

## 2023-09-14 ENCOUNTER — Ambulatory Visit (INDEPENDENT_AMBULATORY_CARE_PROVIDER_SITE_OTHER): Payer: Medicare Other | Admitting: Nurse Practitioner

## 2023-09-14 ENCOUNTER — Encounter: Payer: Self-pay | Admitting: Nurse Practitioner

## 2023-09-14 VITALS — BP 129/83 | HR 78 | Temp 99.2°F | Ht 58.5 in | Wt 138.4 lb

## 2023-09-14 DIAGNOSIS — K5901 Slow transit constipation: Secondary | ICD-10-CM

## 2023-09-14 DIAGNOSIS — J3089 Other allergic rhinitis: Secondary | ICD-10-CM | POA: Diagnosis not present

## 2023-09-14 DIAGNOSIS — J309 Allergic rhinitis, unspecified: Secondary | ICD-10-CM | POA: Insufficient documentation

## 2023-09-14 DIAGNOSIS — K5900 Constipation, unspecified: Secondary | ICD-10-CM | POA: Insufficient documentation

## 2023-09-14 MED ORDER — RYALTRIS 665-25 MCG/ACT NA SUSP: 1.0000 | Freq: Two times a day (BID) | NASAL | 1 refills | Status: AC

## 2023-09-14 NOTE — Assessment & Plan Note (Signed)
Ongoing issue.  Discussed with her at length changes with aging, including slowing of bowels and reduction of wanting fluids leading to poor hydration.  Have recommend she increase water intake throughout day to help reduce constipation and improve hydration.  Plus discussed with her that when taking Metamucil you need to hydrate well.  She is to have a least one BM a day without straining.  Recommend continue Metamucil, but add on Senna S.  If diarrhea presents then cut back on how often this is taken.

## 2023-09-14 NOTE — Assessment & Plan Note (Signed)
Chronic issue with some worsening at present over past month.  Will stop her Flonase and see if we can obtain Ryaltris, script sent.  May benefit fro medications.  Recommend she reduce sinus nasal spray, but not cold [place] it as symptoms could rebound.  Would benefit from discontinuing Severe sinus nasal spray in future.  Trial Xyzal daily.  If worsening or ongoing consider referral for testing and possibly injections.

## 2023-09-14 NOTE — Progress Notes (Signed)
BP 129/83   Pulse 78   Temp 99.2 F (37.3 C) (Oral)   Ht 4' 10.5" (1.486 m)   Wt 138 lb 6.4 oz (62.8 kg)   SpO2 98%   BMI 28.43 kg/m    Subjective:    Patient ID: Marie Parker, female    DOB: [birth year], 78 y.o.   MRN: [id number]  HPI: Marie Parker is a 78 y.o. female  Chief Complaint  Patient presents with   Nasal Congestion    Patient states she has been having issues from congestion for the last month. States she uses nasal spray that helps some.   Bowel Issues    Patient states she has been having issues with her bowels lately. States she is not constipated and doesn't have diarrhea, but feels like she is not having regular bowel movements. States she has been having a lot more gas lately as well.    NASAL CONGESTION Has been present for one month or more, sometimes throat gets a little off.  Using sinus severe nasal spray, every 10 - 12 hours.  Has tried Claritin with no benefit.  Has always had a lot of allergies, but Flonase is not touching this.  Does saline spray without much benefit.  Has never had allergy testing or taken injections. Fever: no Cough: once and awhile Shortness of breath: no Wheezing: no Chest pain: no Chest tightness: no Chest congestion: no Nasal congestion: yes Runny nose: yes Post nasal drip: yes Sneezing: yes Sore throat: no Swollen glands: no Sinus pressure: yes Headache: no Face pain: no Toothache: no Ear pain: none Ear pressure: yes bilateral Eyes red/itching:no Eye drainage/crusting: no  Vomiting: no Rash: no Fatigue: a little Sick contacts: no Strep contacts: no  Context: fluctuating Recurrent sinusitis: no Relief with OTC cold/cough medications: no  Treatments attempted: as above    BOWEL ISSUES Has regular BM, but is only a little bit.  Will feel like has to go and then will notice gas. She does strain with bowel movements.  Is using Metamucil.  Does not drink a lot of water and does not get much fiber in  diet. Status: fluctuating Treatments attempted: Miralax and Metamucil Nausea: no Vomiting: no Weight loss: no Decreased appetite: no Diarrhea: no Constipation: yes Blood in stool: no Heartburn: no  Relevant past medical, surgical, family and social history reviewed and updated as indicated. Interim medical history since our last visit reviewed. Allergies and medications reviewed and updated.  Review of Systems  Constitutional:  Positive for fatigue (a little). Negative for activity change, appetite change, fever and unexpected weight change.  HENT:  Positive for congestion, postnasal drip, rhinorrhea, sinus pressure and sneezing. Negative for ear pain, sinus pain, sore throat and voice change.   Respiratory:  Positive for cough (occasionally). Negative for chest tightness, shortness of breath and wheezing.   Cardiovascular:  Negative for chest pain, palpitations and leg swelling.  Gastrointestinal:  Positive for constipation. Negative for abdominal distention, abdominal pain, blood in stool, diarrhea, nausea and vomiting.  Neurological: Negative.   Psychiatric/Behavioral: Negative.     Per HPI unless specifically indicated above     Objective:    BP 129/83   Pulse 78   Temp 99.2 F (37.3 C) (Oral)   Ht 4' 10.5" (1.486 m)   Wt 138 lb 6.4 oz (62.8 kg)   SpO2 98%   BMI 28.43 kg/m   Wt Readings from Last 3 Encounters:  09/14/23 138 lb 6.4 oz (62.8 kg)  08/14/23  138 lb 12.8 oz (63 kg)  05/09/23 136 lb 9.6 oz (62 kg)    Physical Exam Vitals and nursing note reviewed.  Constitutional:      General: She is awake. She is not in acute distress.    Appearance: She is well-developed and well-groomed. She is not ill-appearing or toxic-appearing.  HENT:     Head: Normocephalic.     Right Ear: Hearing, ear canal and external ear normal. A middle ear effusion is present. There is no impacted cerumen. Tympanic membrane is not injected.     Left Ear: Hearing, ear canal and external  ear normal. A middle ear effusion is present. There is no impacted cerumen. Tympanic membrane is not injected.     Nose: Congestion and rhinorrhea present. Rhinorrhea is clear.     Right Sinus: No maxillary sinus tenderness or frontal sinus tenderness.     Left Sinus: No maxillary sinus tenderness or frontal sinus tenderness.     Mouth/Throat:     Mouth: Mucous membranes are moist.     Pharynx: Posterior oropharyngeal erythema (mild cobblestone pattern) present. No pharyngeal swelling or oropharyngeal exudate.  Eyes:     General: Lids are normal.        Right eye: No discharge.        Left eye: No discharge.     Conjunctiva/sclera: Conjunctivae normal.     Pupils: Pupils are equal, round, and reactive to light.  Neck:     Thyroid: No thyromegaly.     Vascular: No carotid bruit.  Cardiovascular:     Rate and Rhythm: Normal rate and regular rhythm.     Heart sounds: Normal heart sounds. No murmur heard.    No gallop.  Pulmonary:     Effort: Pulmonary effort is normal. No accessory muscle usage or respiratory distress.     Breath sounds: Normal breath sounds.  Abdominal:     General: Bowel sounds are normal. There is no distension.     Palpations: Abdomen is soft. There is no mass.     Tenderness: There is no abdominal tenderness.  Musculoskeletal:     Cervical back: Normal range of motion and neck supple.     Right lower leg: No edema.     Left lower leg: No edema.  Lymphadenopathy:     Head:     Right side of head: No submental, submandibular, tonsillar, preauricular or posterior auricular adenopathy.     Left side of head: No submental, submandibular, tonsillar, preauricular or posterior auricular adenopathy.     Cervical: No cervical adenopathy.  Skin:    General: Skin is warm and dry.  Neurological:     Mental Status: She is alert and oriented to person, place, and time.     Deep Tendon Reflexes: Reflexes are normal and symmetric.     Reflex Scores:      Brachioradialis  reflexes are 2+ on the right side and 2+ on the left side.      Patellar reflexes are 2+ on the right side and 2+ on the left side. Psychiatric:        Attention and Perception: Attention normal.        Mood and Affect: Mood normal.        Speech: Speech normal.        Behavior: Behavior normal. Behavior is cooperative.        Thought Content: Thought content normal.    Results for orders placed or performed in visit on 05/09/23  Microalbumin, Urine Waived   Collection Time: 05/09/23  8:30 AM  Result Value Ref Range   Microalb, Ur Waived 30 (H) 0 - 19 mg/L   Creatinine, Urine Waived 200 10 - 300 mg/dL   Microalb/Creat Ratio <30 <30 mg/g  CBC with Differential/Platelet   Collection Time: 05/09/23  8:31 AM  Result Value Ref Range   WBC 5.6 3.4 - 10.8 x10E3/uL   RBC 4.30 3.77 - 5.28 x10E6/uL   Hemoglobin 12.3 11.1 - 15.9 g/dL   Hematocrit 70.6 23.7 - 46.6 %   MCV 93 79 - 97 fL   MCH 28.6 26.6 - 33.0 pg   MCHC 30.8 (L) 31.5 - 35.7 g/dL   RDW 62.8 31.5 - 17.6 %   Platelets 215 150 - 450 x10E3/uL   Neutrophils 63 Not Estab. %   Lymphs 24 Not Estab. %   Monocytes 7 Not Estab. %   Eos 5 Not Estab. %   Basos 1 Not Estab. %   Neutrophils Absolute 3.6 1.4 - 7.0 x10E3/uL   Lymphocytes Absolute 1.4 0.7 - 3.1 x10E3/uL   Monocytes Absolute 0.4 0.1 - 0.9 x10E3/uL   EOS (ABSOLUTE) 0.3 0.0 - 0.4 x10E3/uL   Basophils Absolute 0.0 0.0 - 0.2 x10E3/uL   Immature Granulocytes 0 Not Estab. %   Immature Grans (Abs) 0.0 0.0 - 0.1 x10E3/uL  Comprehensive metabolic panel   Collection Time: 05/09/23  8:31 AM  Result Value Ref Range   Glucose 102 (H) 70 - 99 mg/dL   BUN 11 8 - 27 mg/dL   Creatinine, Ser 1.60 0.57 - 1.00 mg/dL   eGFR 59 (L) >73 XT/GGY/6.94   BUN/Creatinine Ratio 11 (L) 12 - 28   Sodium 142 134 - 144 mmol/L   Potassium 4.4 3.5 - 5.2 mmol/L   Chloride 102 96 - 106 mmol/L   CO2 23 20 - 29 mmol/L   Calcium 9.5 8.7 - 10.3 mg/dL   Total Protein 6.6 6.0 - 8.5 g/dL   Albumin 4.4 3.8  - 4.8 g/dL   Globulin, Total 2.2 1.5 - 4.5 g/dL   Bilirubin Total 0.2 0.0 - 1.2 mg/dL   Alkaline Phosphatase 95 44 - 121 IU/L   AST 25 0 - 40 IU/L   ALT 21 0 - 32 IU/L  Lipid Panel w/o Chol/HDL Ratio   Collection Time: 05/09/23  8:31 AM  Result Value Ref Range   Cholesterol, Total 137 100 - 199 mg/dL   Triglycerides 854 (H) 0 - 149 mg/dL   HDL 49 >62 mg/dL   VLDL Cholesterol Cal 26 5 - 40 mg/dL   LDL Chol Calc (NIH) 62 0 - 99 mg/dL  TSH   Collection Time: 05/09/23  8:31 AM  Result Value Ref Range   TSH 2.960 0.450 - 4.500 uIU/mL  Uric acid   Collection Time: 05/09/23  8:31 AM  Result Value Ref Range   Uric Acid 6.4 3.1 - 7.9 mg/dL      Assessment & Plan:   Problem List Items Addressed This Visit       Respiratory   Allergic rhinitis - Primary   Chronic issue with some worsening at present over past month.  Will stop her Flonase and see if we can obtain Ryaltris, script sent.  May benefit fro medications.  Recommend she reduce sinus nasal spray, but not cold [place] it as symptoms could rebound.  Would benefit from discontinuing Severe sinus nasal spray in future.  Trial Xyzal daily.  If worsening or ongoing  consider referral for testing and possibly injections.        Other   Constipation   Ongoing issue.  Discussed with her at length changes with aging, including slowing of bowels and reduction of wanting fluids leading to poor hydration.  Have recommend she increase water intake throughout day to help reduce constipation and improve hydration.  Plus discussed with her that when taking Metamucil you need to hydrate well.  She is to have a least one BM a day without straining.  Recommend continue Metamucil, but add on Senna S.  If diarrhea presents then cut back on how often this is taken.        Follow up plan: Return if symptoms worsen or fail to improve.

## 2023-09-14 NOTE — Patient Instructions (Addendum)
SENNA S start this daily -- and if diarrhea happens then back off and take as needed only.  Drink more water daily and get more fiber in diet.  Start Xyzal daily -- allergy medicine.  Chronic Constipation Chronic constipation is when a person poops 3 times a week or less for 3 months or longer. It is most common in older adults. What are the causes? This condition may be caused by: Not drinking enough fluid, eating enough fiber, or getting enough exercise. Pregnancy. An anal fissure. This is a tear in the opening of the butt (anus). Bowel obstruction. This is when there is a blockage in your intestine (bowel). Bowel stricture. This is when your bowel narrows. A long-term (chronic) condition, such as: Diabetes or hypothyroidism. A stroke or injury to the spinal cord. Multiple sclerosis or Parkinson's disease. Colon cancer. Dementia. Inflammatory bowel disease (IBD), rectal prolapse, or hemorrhoids. Taking certain medicines. These include: Some pain medicines. Antacids or iron supplements. Water pills (diuretics). Some blood pressure medicines. Medicines to keep you from having seizures (anti-seizure medicines). Antidepressants. Medicines for Parkinson's disease. Other causes may include: Stress. Problems with the nerves and muscles that help you poop. Weak muscles in the area between your hip bones (pelvis). What increases the risk? You may be more at risk for this condition if: You are older than 77 years of age. You are female. You live in a long-term care facility. You have a chronic condition. You have a mental health disorder or eating disorder. What are the signs or symptoms? The main sign of this condition is pooping 3 times a week or less. Other signs and symptoms may include: Pushing hard (straining) to poop. Hard or lumpy poop (stool). Pain when you poop. Discomfort in your lower abdomen. This may include cramps and bloating. Not being able to poop when you need  to. Feeling like you still need to poop after you are done. Feeling like there is something in your rectum that is blocking or stopping you from pooping. Blood in your poop or seeing blood on the toilet paper after you poop. Confusion. This is most common in older adults. How is this diagnosed? This condition may be diagnosed based on your symptoms and medical history. You may also need: An exam of your abdomen. A digital rectal exam. For this exam, your health care provider will put a gloved finger into your rectum. Testing. Tests may include: Imaging studies, such as an X-ray, ultrasound, or CT scan. Blood tests. A colonoscopy. This is a test that looks at your colon. A test of how well your anal sphincter works. This is a muscle shaped like a ring. It helps your anus open and close. A test of how well food moves through your colon. An electromyography. This is a test of the nerves in your pelvis. How is this treated? Treatment depends on the cause. You may need to: Be more active. Drink more fluids. Add fiber to your diet. Sources of fiber include fruits, vegetables, and whole grains. You may also need to take a fiber supplement. Stop or change medicines that can cause constipation. Take medicines, such as: Pro-motility agents. These help your muscles tighten (contract). This can help you push out poop. Stool softeners. These soften your poop. An osmotic laxative. A laxative is a medicine that helps you poop. This type works by [employer] into your colon. Train the muscles in your pelvis with biofeedback. Have surgery. This may be done if something is blocking your  bowels. You may also need to see an expert in problems with the digestive system (gastroenterologist). Follow these instructions at home: Medicines Take over-the-counter and prescription medicines only as told by your provider. If you are taking a laxative, take it only as told by your provider. Eating and  drinking  Drink enough fluid to keep your pee (urine) pale yellow. Eat foods that are high in fiber, such as beans, whole grains, and fresh fruits and vegetables. Limit foods that are high in fat and processed sugars, such as fried or sweet foods. Stay away from alcohol, caffeine, and soda. These can make constipation worse. General instructions Get physical activity every day. Ask your provider what activities are safe for you. Get colon cancer screenings as told by your provider. Contact a health care provider if: You poop 3 times a week or less. Your poop is hard or lumpy. There is blood on the toilet paper or in your poop. You lose weight for no clear reason. You have pain in your rectum. Poop is leaking out of your rectum. You have nausea or vomiting. This information is not intended to replace advice given to you by your health care provider. Make sure you discuss any questions you have with your health care provider. Document Revised: 05/29/2022 Document Reviewed: 05/29/2022 Elsevier Patient Education  2024 [employer].

## 2023-09-20 ENCOUNTER — Encounter: Payer: Self-pay | Admitting: Pediatrics

## 2023-09-20 ENCOUNTER — Ambulatory Visit (INDEPENDENT_AMBULATORY_CARE_PROVIDER_SITE_OTHER): Payer: Medicare Other | Admitting: Pediatrics

## 2023-09-20 VITALS — BP 128/74 | HR 69 | Temp 98.4°F | Resp 14 | Wt 137.6 lb

## 2023-09-20 DIAGNOSIS — J309 Allergic rhinitis, unspecified: Secondary | ICD-10-CM

## 2023-09-20 DIAGNOSIS — Z1330 Encounter for screening examination for mental health and behavioral disorders, unspecified: Secondary | ICD-10-CM

## 2023-09-20 LAB — VERITOR FLU A/B WAIVED
Influenza A: NEGATIVE
Influenza B: NEGATIVE

## 2023-09-20 MED ORDER — METHYLPREDNISOLONE 4 MG PO TBPK
ORAL_TABLET | ORAL | 0 refills | Status: DC
Start: 2023-09-20 — End: 2023-11-06

## 2023-09-20 NOTE — Progress Notes (Signed)
Office Visit  BP 128/74 (BP Location: Left Arm, Patient Position: Sitting, Cuff Size: Normal)   Pulse 69   Temp 98.4 F (36.9 C) (Oral)   Resp 14   Wt 137 lb 9.6 oz (62.4 kg)   SpO2 98%   BMI 28.27 kg/m    Subjective:    Patient ID: Marie Parker, female    DOB: 1946-05-10, 78 y.o.   MRN: [id number]  HPI: Marie Parker is a 78 y.o. female  Chief Complaint  Patient presents with   URI    Ongoing 2 months and not getting anymore. Already seen 2 times last month. Congestion, sore throat possible from draining, not sleeping last night. Nasal spray not helping much.     Discussed the use of AI scribe software for clinical note transcription with the patient, who gave verbal consent to proceed.  History of Present Illness   The patient presents with a two-month history of worsening nasal congestion, which has become particularly bothersome at night. She reports difficulty breathing in and out due to the congestion, which is present in both sinuses. The patient also notes a sensation of drainage, but denies any pain. Despite not feeling generally unwell, the patient reports fatigue due to the disruption of her sleep pattern caused by the inability to breathe. She estimates sleeping only two hours per night due to this issue.  The patient has attempted to manage her symptoms with over-the-counter Mucinex and a sinus severe nasal spray. She reports some temporary relief from the nasal spray, but it does not last long enough to provide sustained comfort. The patient also mentions a recent prescription for Rayaltris and Sysyl, but she did not obtain these medications due to the high cost.  In addition to the nasal congestion, the patient has been experiencing issues with her TMJ, which she describes as sore. She denies any tenderness in the area upon examination. The patient also reports a history of sinus issues, which she perceives to have worsened with age. However, she describes the  current episode as the worst she has ever experienced.  The patient denies any other symptoms such as cough, fever, nausea, or vomiting. She also denies any changes in the color or consistency of her nasal discharge, which remains clear. The patient has not been prescribed any antibiotics for her current condition. She has not been referred to a specialist, but expresses a willingness to see one if necessary.     Relevant past medical, surgical, family and social history reviewed and updated as indicated. Interim medical history since our last visit reviewed. Allergies and medications reviewed and updated.  ROS per HPI unless specifically indicated above     Objective:    BP 128/74 (BP Location: Left Arm, Patient Position: Sitting, Cuff Size: Normal)   Pulse 69   Temp 98.4 F (36.9 C) (Oral)   Resp 14   Wt 137 lb 9.6 oz (62.4 kg)   SpO2 98%   BMI 28.27 kg/m   Wt Readings from Last 3 Encounters:  09/20/23 137 lb 9.6 oz (62.4 kg)  09/14/23 138 lb 6.4 oz (62.8 kg)  08/14/23 138 lb 12.8 oz (63 kg)     Physical Exam Constitutional:      General: She is not in acute distress.    Appearance: She is not ill-appearing or toxic-appearing.  HENT:     Nose: Congestion present.     Right Nostril: Occlusion present.     Left Nostril: Occlusion present.  Right Turbinates: Enlarged and swollen.     Left Turbinates: Enlarged and swollen.     Right Sinus: No maxillary sinus tenderness or frontal sinus tenderness.     Left Sinus: No maxillary sinus tenderness or frontal sinus tenderness.  Eyes:     Pupils: Pupils are equal, round, and reactive to light.  Cardiovascular:     Rate and Rhythm: Normal rate and regular rhythm.     Pulses: Normal pulses.     Heart sounds: Normal heart sounds.  Pulmonary:     Effort: Pulmonary effort is normal.     Breath sounds: Normal breath sounds.         09/20/2023   10:05 AM 08/14/2023    1:10 PM 05/09/2023    8:25 AM 11/03/2022    8:49 AM 05/05/2022     9:25 AM  Depression screen PHQ 2/9  Decreased Interest 0 1 1 1  0  Down, Depressed, Hopeless 0 0 1 0 0  PHQ - 2 Score 0 1 2 1  0  Altered sleeping 1 1 0 1 1  Tired, decreased energy 1 0 1 1 1   Change in appetite 0 0 0 0 0  Feeling bad or failure about yourself  0 0 0 0 0  Trouble concentrating 0 0 0 0 0  Moving slowly or fidgety/restless 0 0 0 1 0  Suicidal thoughts 0 0 0 0 0  PHQ-9 Score 2 2 3 4 2   Difficult doing work/chores Not difficult at all Not difficult at all Somewhat difficult  Not difficult at all       09/20/2023   10:06 AM 08/14/2023    1:10 PM 05/09/2023    8:26 AM 11/03/2022    8:50 AM  GAD 7 : Generalized Anxiety Score  Nervous, Anxious, on Edge 0 1 0 0  Control/stop worrying 0 1 0 0  Worry too much - different things 0 0 0 0  Trouble relaxing 0 0 0 1  Restless 0 0 0 0  Easily annoyed or irritable 1 1 0 1  Afraid - awful might happen 0 1 0 0  Total GAD 7 Score 1 4 0 2  Anxiety Difficulty Not difficult at all   Somewhat difficult       Assessment & Plan:  Assessment & Plan   Allergic rhinitis, unspecified seasonality, unspecified trigger Severe nasal congestion and postnasal drainage for 2 months, worsening over time. Suspect recurrent sinusitis. No relief with over-the-counter Mucinex and severe sinus nasal spray. Turbinates significantly swollen on examination. Plan on systemic therapy given refractory symptoms. Will have ENT eval as well. -Refer to ENT specialist for further evaluation and management. -Perform swab for flu and COVID-19 to rule out concurrent viral infection exacerbating symptoms. -     Ambulatory referral to ENT -     Veritor Flu A/B Waived -     Novel Coronavirus, NAA ([facility]) -     methylPREDNISolone; Take as directed on packaging  Dispense: 21 each; Refill: 0  Encounter for behavioral health screening As part of their intake evaluation, the patient was screened for depression, anxiety.  PHQ9 SCORE 2, GAD7 SCORE 1. Screening results  negative for tested conditions. Ctm.  Follow up plan: Return in about 2 weeks (around 10/04/2023).  Alissa Pharr Howell Pringle, MD

## 2023-09-20 NOTE — Patient Instructions (Addendum)
Begin steroid pack. Consider switching to zyrtec daily.  Will call you about the spray that [facility] sent The specialist will call ASAP

## 2023-09-22 LAB — NOVEL CORONAVIRUS, NAA: SARS-CoV-2, NAA: NOT DETECTED

## 2023-09-24 DIAGNOSIS — H6123 Impacted cerumen, bilateral: Secondary | ICD-10-CM | POA: Diagnosis not present

## 2023-09-24 DIAGNOSIS — J3489 Other specified disorders of nose and nasal sinuses: Secondary | ICD-10-CM | POA: Diagnosis not present

## 2023-09-24 DIAGNOSIS — J302 Other seasonal allergic rhinitis: Secondary | ICD-10-CM | POA: Diagnosis not present

## 2023-09-25 ENCOUNTER — Encounter: Payer: Self-pay | Admitting: Pediatrics

## 2023-10-04 ENCOUNTER — Other Ambulatory Visit: Payer: Self-pay | Admitting: Family Medicine

## 2023-10-04 DIAGNOSIS — F411 Generalized anxiety disorder: Secondary | ICD-10-CM

## 2023-10-04 NOTE — Progress Notes (Signed)
 Cardiology Clinic Note   Date: 10/05/2023 ID: Marie Parker, DOB 1945/09/26, MRN [id number]  Primary Cardiologist:  Deatrice Cage, MD  Chief Complaint   Marie Parker is a 78 y.o. female who presents to the clinic today for routine annual follow up.   Patient Profile   Marie Parker is followed by Dr. Cage for the history outlined below.      Past medical history significant for: Nonobstructive CAD. LHC 09/17/2007: Mid LAD 40%.  Proximal RCA 30%.  EF 57%. Nuclear stress test 01/05/2017: Low risk study.  No significant ischemia.  Septal wall motion abnormality consistent with underlying conduction disorder.  Resting EKG with LBBB. LBBB. PAD. Right iliac artery stenting 1993, 1997. Hypertension. Hyperlipidemia. Lipid panel 05/09/2023: LDL 62, HDL 49, TG 152, total 137. GERD. Breast cancer. S/p mastectomy. Former tobacco abuse.  In summary, LHC January 2009 showed nonobstructive CAD as outlined above.  Patient was evaluated by Dr. Gollan in May 2014 for chest pain prompting 2 ED visits over the 2 months prior.  Patient's first ED visit for stabbing pain in her back radiating to her xiphoid region with an unremarkable workup.  Second ED visit several days prior to office visit for burning from 1 shoulder to the other also with unremarkable workup.  EKG showed NSR, 64 bpm, LBBB.  Patient was first evaluated by Dr. Cage in May 2018 for chest pain at the request of Dr. Vicci.  Patient had been seen the month prior in the ED for chest pain with a negative workup.  Chest pain was described as tightness lasting 5 to 10 minutes.  She also reported symptoms of heartburn and mild exertional dyspnea.  Nuclear stress testing was a low risk study as detailed above.  Patient was last seen in the office by Dr. Cage on 08/10/2022 for routine follow-up.  She was doing well at that time and no changes were made.     History of Present Illness    Today, patient is here alone. Patient  denies shortness of breath, dyspnea on exertion, lower extremity edema, orthopnea or PND. No chest pain, pressure, or tightness. No palpitations. She is primary caregiver for her husband who has pulmonary hypertension. She does not do regular exercise but stays busy with household activities. She has no complaints today.     ROS: All other systems reviewed and are otherwise negative except as noted in History of Present Illness.  EKGs/Labs Reviewed    EKG Interpretation Date/Time:  Friday October 05 2023 08:57:26 EST Ventricular Rate:  73 PR Interval:  146 QRS Duration:  140 QT Interval:  446 QTC Calculation: 491 R Axis:   -22  Text Interpretation: Normal sinus rhythm Left bundle branch block When compared with ECG of 15-Apr-2018 14:36, Nonspecific T wave abnormality, improved in Inferior leads T wave amplitude has increased in Anterior leads Inverted T waves have replaced nonspecific T wave abnormality in Lateral leads Confirmed by Loistine Sober [phone]) on 10/05/2023 9:02:07 AM   05/09/2023: ALT 21; AST 25; BUN 11; Creatinine, Ser 0.99; Potassium 4.4; Sodium 142   05/09/2023: Hemoglobin 12.3; WBC 5.6   05/09/2023: TSH 2.960    Physical Exam    VS:  BP 130/70 (BP Location: Left Arm, Patient Position: Sitting, Cuff Size: Normal)   Pulse 73   Ht 4' 11 (1.499 m)   Wt 137 lb 2 oz (62.2 kg)   SpO2 96%   BMI 27.70 kg/m  , BMI Body mass index is 27.7 kg/m.  GEN: Well  nourished, well developed, in no acute distress. Neck: No JVD or carotid bruits. Cardiac:  RRR. No murmurs. No rubs or gallops.   Respiratory:  Respirations regular and unlabored. Clear to auscultation without rales, wheezing or rhonchi. GI: Soft, nontender, nondistended. Extremities: Radials/DP/PT 2+ and equal bilaterally. No clubbing or cyanosis. No edema.  Skin: Warm and dry, no rash. Neuro: Strength intact.  Assessment & Plan   Nonobstructive CAD LHC January 20,009 showed mid LAD 40%, proximal RCA 30%.   Nuclear stress test May 2018 was a low risk study with no significant ischemia.  Patient denies chest pain, pressure or tightness. She does not exercise regularly but stays active with household chores and caring for her husband.  -Continue amlodipine , atorvastatin , atenolol .  PAD S/p right iliac artery stenting 1993, 1997.  Patient denies claudication. Palpable pulses bilateral lower extremities.  -Continue atorvastatin .  Hypertension BP today 130/70. No headaches or dizziness reported.  -Continue amlodipine , atenolol , benazepril , hydralazine .  Hyperlipidemia LDL September 2024 62, at goal. -Continue atorvastatin .  Disposition: Return in 1 year or sooner as needed.          Signed, Barnie HERO. Mckenzey Parcell, DNP, NP-C

## 2023-10-05 ENCOUNTER — Encounter: Payer: Self-pay | Admitting: Student

## 2023-10-05 ENCOUNTER — Ambulatory Visit: Payer: Medicare Other | Attending: Student | Admitting: Student

## 2023-10-05 VITALS — BP 130/70 | HR 73 | Ht 59.0 in | Wt 137.1 lb

## 2023-10-05 DIAGNOSIS — I2510 Atherosclerotic heart disease of native coronary artery without angina pectoris: Secondary | ICD-10-CM | POA: Diagnosis not present

## 2023-10-05 DIAGNOSIS — I739 Peripheral vascular disease, unspecified: Secondary | ICD-10-CM | POA: Diagnosis not present

## 2023-10-05 DIAGNOSIS — I10 Essential (primary) hypertension: Secondary | ICD-10-CM | POA: Diagnosis not present

## 2023-10-05 DIAGNOSIS — I447 Left bundle-branch block, unspecified: Secondary | ICD-10-CM | POA: Diagnosis not present

## 2023-10-05 NOTE — Patient Instructions (Signed)
 Medication Instructions:  Your Physician recommend you continue on your current medication as directed.    *If you need a refill on your cardiac medications before your next appointment, please call your pharmacy*   Lab Work: None ordered at this time  If you have labs (blood work) drawn today and your tests are completely normal, you will receive your results only by: MyChart Message (if you have MyChart) OR A paper copy in the mail If you have any lab test that is abnormal or we need to change your treatment, we will call you to review the results.   Testing/Procedures: None ordered at this time    Follow-Up: At [facility], you and your health needs are our priority.  As part of our continuing mission to provide you with exceptional heart care, we have created designated Provider Care Teams.  These Care Teams include your primary Cardiologist (physician) and Advanced Practice Providers (APPs -  Physician Assistants and Nurse Practitioners) who all work together to provide you with the care you need, when you need it.   Your next appointment:   12 month(s)  Provider:   You may see Deatrice Cage, MD or one of the following Advanced Practice Providers on your designated Care Team:   Lonni Meager, NP Bernardino Bring, PA-C Cadence Franchester, PA-C Tylene Lunch, NP Barnie Hila, NP

## 2023-10-05 NOTE — Telephone Encounter (Signed)
 Requested Prescriptions  Pending Prescriptions Disp Refills   amitriptyline  (ELAVIL ) 25 MG tablet [Pharmacy Med Name: Amitriptyline  HCl 25 MG Oral Tablet] 90 tablet 0    Sig: TAKE 1 TABLET BY MOUTH AT  BEDTIME     Psychiatry:  Antidepressants - Heterocyclics (TCAs) Passed - 10/05/2023  9:03 AM      Passed - Valid encounter within last 6 months    Recent Outpatient Visits           2 weeks ago Allergic rhinitis, unspecified seasonality, unspecified trigger   [place] [facility] Herold Hadassah SQUIBB, MD   3 weeks ago Non-seasonal allergic rhinitis, unspecified trigger   [facility] [place], Melanie T, NP   1 month ago Change in bowel habits   [place] [facility] [place], Megan P, DO   4 months ago Encounter for [employer] annual wellness exam   [place] [facility] [employer], [place], DO   11 months ago Aortic atherosclerosis [facility])   [place] [facility] [place], Duwaine SQUIBB, DO       Future Appointments             In 1 month Johnson, Duwaine SQUIBB, DO [place] [facility]

## 2023-10-08 DIAGNOSIS — K219 Gastro-esophageal reflux disease without esophagitis: Secondary | ICD-10-CM | POA: Diagnosis not present

## 2023-10-08 DIAGNOSIS — J0100 Acute maxillary sinusitis, unspecified: Secondary | ICD-10-CM | POA: Diagnosis not present

## 2023-10-08 DIAGNOSIS — J302 Other seasonal allergic rhinitis: Secondary | ICD-10-CM | POA: Diagnosis not present

## 2023-10-08 DIAGNOSIS — J3489 Other specified disorders of nose and nasal sinuses: Secondary | ICD-10-CM | POA: Diagnosis not present

## 2023-10-25 ENCOUNTER — Other Ambulatory Visit: Payer: Self-pay | Admitting: Family Medicine

## 2023-10-25 DIAGNOSIS — F411 Generalized anxiety disorder: Secondary | ICD-10-CM

## 2023-10-25 NOTE — Telephone Encounter (Signed)
 Requested Prescriptions  Refused Prescriptions Disp Refills   amitriptyline (ELAVIL) 25 MG tablet [Pharmacy Med Name: Amitriptyline HCl 25 MG Oral Tablet] 30 tablet 11    Sig: TAKE 1 TABLET BY MOUTH AT  BEDTIME     Psychiatry:  Antidepressants - Heterocyclics (TCAs) Passed - 10/25/2023  5:03 PM      Passed - Valid encounter within last 6 months    Recent Outpatient Visits           1 month ago Allergic rhinitis, unspecified seasonality, unspecified trigger   [place] [facility] Jackolyn Confer, MD   1 month ago Non-seasonal allergic rhinitis, unspecified trigger   [place] [facility] O[place], Corrie Dandy T, NP   2 months ago Change in bowel habits   [place] [facility] [place], Megan P, DO   5 months ago Encounter for [employer] annual wellness exam   [place] [facility] [place], [place], DO   11 months ago Aortic atherosclerosis [facility])   [place] [facility] [place], Oralia Rud, DO       Future Appointments             In 1 week [place], Oralia Rud, DO [place] [facility], [facility]

## 2023-11-01 ENCOUNTER — Ambulatory Visit: Payer: Medicare Other | Admitting: Pediatrics

## 2023-11-06 ENCOUNTER — Ambulatory Visit (INDEPENDENT_AMBULATORY_CARE_PROVIDER_SITE_OTHER): Payer: Medicare Other | Admitting: Family Medicine

## 2023-11-06 VITALS — BP 135/78 | Temp 98.3°F | Resp 16 | Ht 59.02 in | Wt 137.0 lb

## 2023-11-06 DIAGNOSIS — J3089 Other allergic rhinitis: Secondary | ICD-10-CM

## 2023-11-06 DIAGNOSIS — I129 Hypertensive chronic kidney disease with stage 1 through stage 4 chronic kidney disease, or unspecified chronic kidney disease: Secondary | ICD-10-CM

## 2023-11-06 DIAGNOSIS — M1A9XX0 Chronic gout, unspecified, without tophus (tophi): Secondary | ICD-10-CM | POA: Diagnosis not present

## 2023-11-06 DIAGNOSIS — I700 Atherosclerosis of aorta: Secondary | ICD-10-CM | POA: Diagnosis not present

## 2023-11-06 DIAGNOSIS — K219 Gastro-esophageal reflux disease without esophagitis: Secondary | ICD-10-CM | POA: Diagnosis not present

## 2023-11-06 DIAGNOSIS — E782 Mixed hyperlipidemia: Secondary | ICD-10-CM

## 2023-11-06 DIAGNOSIS — F411 Generalized anxiety disorder: Secondary | ICD-10-CM

## 2023-11-06 DIAGNOSIS — C50812 Malignant neoplasm of overlapping sites of left female breast: Secondary | ICD-10-CM

## 2023-11-06 MED ORDER — OMEPRAZOLE 40 MG PO CPDR
DELAYED_RELEASE_CAPSULE | ORAL | 3 refills | Status: DC
Start: 2023-11-06 — End: 2024-05-13

## 2023-11-06 MED ORDER — ATORVASTATIN CALCIUM 40 MG PO TABS: 40.0000 mg | ORAL_TABLET | Freq: Every day | ORAL | 1 refills | Status: DC

## 2023-11-06 MED ORDER — AMLODIPINE BESYLATE 10 MG PO TABS: 10.0000 mg | ORAL_TABLET | Freq: Every day | ORAL | 1 refills | Status: DC

## 2023-11-06 MED ORDER — ATENOLOL 50 MG PO TABS: 50.0000 mg | ORAL_TABLET | Freq: Every day | ORAL | 1 refills | Status: DC

## 2023-11-06 MED ORDER — MONTELUKAST SODIUM 10 MG PO TABS: 10.0000 mg | ORAL_TABLET | Freq: Every day | ORAL | 3 refills | Status: DC

## 2023-11-06 MED ORDER — SERTRALINE HCL 100 MG PO TABS: 150.0000 mg | ORAL_TABLET | Freq: Every day | ORAL | 1 refills | Status: DC

## 2023-11-06 MED ORDER — BENAZEPRIL HCL 40 MG PO TABS: 40.0000 mg | ORAL_TABLET | Freq: Every day | ORAL | 1 refills | Status: DC

## 2023-11-06 MED ORDER — AMITRIPTYLINE HCL 25 MG PO TABS
25.0000 mg | ORAL_TABLET | Freq: Every day | ORAL | 1 refills | Status: DC
Start: 2023-11-06 — End: 2024-02-14

## 2023-11-06 MED ORDER — HYDRALAZINE HCL 25 MG PO TABS: 25.0000 mg | ORAL_TABLET | Freq: Two times a day (BID) | ORAL | 1 refills | Status: DC

## 2023-11-06 NOTE — Assessment & Plan Note (Signed)
 Under good control on current regimen. Continue current regimen. Continue to monitor. Call with any concerns. Refills given. Labs drawn today.

## 2023-11-06 NOTE — Assessment & Plan Note (Signed)
 Continue to follow with oncology. Call with any concerns.

## 2023-11-06 NOTE — Assessment & Plan Note (Signed)
 Will start singulair. Encouraged follow up with ENT. Call with any concerns.

## 2023-11-06 NOTE — Progress Notes (Signed)
 BP 135/78 (BP Location: Left Arm, Patient Position: Sitting, Cuff Size: Normal)   Temp 98.3 F (36.8 C) (Oral)   Resp 16   Ht 4' 11.02" (1.499 m)   Wt 137 lb (62.1 kg)   SpO2 95%   BMI 27.66 kg/m    Subjective:    Patient ID: Marie Parker, female    DOB: 26-Jul-1946, 78 y.o.   MRN: [id number]  HPI: Marie Parker is a 78 y.o. female  Chief Complaint  Patient presents with   Chronic Kidney Disease   Hyperlipidemia   Gout   Allergies    Ongoing and still not resolving   Continues to follow with ENT- she has been having issues with sneezing and stuffy, runny nose. She has been taking Zyrtec and flonase. No fevers or chills.   HYPERTENSION / HYPERLIPIDEMIA Satisfied with current treatment? yes Duration of hypertension: chronic BP monitoring frequency: not checking BP medication side effects: no Past BP meds: amlodipine, atenolol, hydralazine, benazepril Duration of hyperlipidemia: chronic Cholesterol medication side effects: none Cholesterol supplements: none Past cholesterol medications: atrovastatin Medication compliance: excellent compliance Aspirin: no Recent stressors: yes Recurrent headaches: no Visual changes: no Palpitations: no Dyspnea: no Chest pain: no Lower extremity edema: no Dizzy/lightheaded: no  No gout flares. Tolerating her medicine well/  DEPRESSION Mood status: controlled Satisfied with current treatment?: yes Symptom severity: mild  Duration of current treatment : chronic Side effects: no Medication compliance: excellent compliance Psychotherapy/counseling: no  Previous psychiatric medications: sertraline Depressed mood: no Anxious mood: no Anhedonia: no Significant weight loss or gain: no Insomnia: no  Fatigue: yes Feelings of worthlessness or guilt: no Impaired concentration/indecisiveness: no Suicidal ideations: no Hopelessness: no Crying spells: no    11/06/2023    8:49 AM 09/20/2023   10:05 AM 08/14/2023    1:10 PM  05/09/2023    8:25 AM 11/03/2022    8:49 AM  Depression screen PHQ 2/9  Decreased Interest 0 0 1 1 1   Down, Depressed, Hopeless 0 0 0 1 0  PHQ - 2 Score 0 0 1 2 1   Altered sleeping 1 1 1  0 1  Tired, decreased energy 1 1 0 1 1  Change in appetite 0 0 0 0 0  Feeling bad or failure about yourself  0 0 0 0 0  Trouble concentrating 0 0 0 0 0  Moving slowly or fidgety/restless 0 0 0 0 1  Suicidal thoughts 0 0 0 0 0  PHQ-9 Score 2 2 2 3 4   Difficult doing work/chores  Not difficult at all Not difficult at all Somewhat difficult     Relevant past medical, surgical, family and social history reviewed and updated as indicated. Interim medical history since our last visit reviewed. Allergies and medications reviewed and updated.  Review of Systems  Constitutional: Negative.   HENT:  Positive for congestion, ear pain, postnasal drip and sneezing. Negative for dental problem, drooling, ear discharge, facial swelling, hearing loss, mouth sores, nosebleeds, rhinorrhea, sinus pressure, sinus pain, sore throat, tinnitus, trouble swallowing and voice change.   Respiratory: Negative.    Cardiovascular: Negative.   Gastrointestinal: Negative.   Musculoskeletal: Negative.   Neurological: Negative.   Psychiatric/Behavioral: Negative.      Per HPI unless specifically indicated above     Objective:    BP 135/78 (BP Location: Left Arm, Patient Position: Sitting, Cuff Size: Normal)   Temp 98.3 F (36.8 C) (Oral)   Resp 16   Ht 4' 11.02" (1.499 m)   Wt  137 lb (62.1 kg)   SpO2 95%   BMI 27.66 kg/m   Wt Readings from Last 3 Encounters:  11/06/23 137 lb (62.1 kg)  10/05/23 137 lb 2 oz (62.2 kg)  09/20/23 137 lb 9.6 oz (62.4 kg)    Physical Exam Vitals and nursing note reviewed.  Constitutional:      General: She is not in acute distress.    Appearance: Normal appearance. She is not ill-appearing, toxic-appearing or diaphoretic.  HENT:     Head: Normocephalic and atraumatic.     Right Ear:  Tympanic membrane, ear canal and external ear normal. There is no impacted cerumen.     Left Ear: Tympanic membrane, ear canal and external ear normal. There is no impacted cerumen.     Nose: Congestion and rhinorrhea present.     Mouth/Throat:     Mouth: Mucous membranes are moist.     Pharynx: Oropharynx is clear. No oropharyngeal exudate or posterior oropharyngeal erythema.  Eyes:     General: No scleral icterus.       Right eye: No discharge.        Left eye: No discharge.     Extraocular Movements: Extraocular movements intact.     Conjunctiva/sclera: Conjunctivae normal.     Pupils: Pupils are equal, round, and reactive to light.  Cardiovascular:     Rate and Rhythm: Normal rate and regular rhythm.     Pulses: Normal pulses.     Heart sounds: Normal heart sounds. No murmur heard.    No friction rub. No gallop.  Pulmonary:     Effort: Pulmonary effort is normal. No respiratory distress.     Breath sounds: Normal breath sounds. No stridor. No wheezing, rhonchi or rales.  Chest:     Chest wall: No tenderness.  Musculoskeletal:        General: Normal range of motion.     Cervical back: Normal range of motion and neck supple.  Skin:    General: Skin is warm and dry.     Capillary Refill: Capillary refill takes less than 2 seconds.     Coloration: Skin is not jaundiced or pale.     Findings: No bruising, erythema, lesion or rash.  Neurological:     General: No focal deficit present.     Mental Status: She is alert and oriented to person, place, and time. Mental status is at baseline.  Psychiatric:        Mood and Affect: Mood normal.        Behavior: Behavior normal.        Thought Content: Thought content normal.        Judgment: Judgment normal.     Results for orders placed or performed in visit on 09/20/23  Veritor Flu A/B Waived   Collection Time: 09/20/23 10:41 AM  Result Value Ref Range   Influenza A Negative Negative   Influenza B Negative Negative  Novel  Coronavirus, NAA ([facility])   Collection Time: 09/20/23  2:14 PM   Specimen: Nasopharyngeal(NP) swabs in vial transport medium  Result Value Ref Range   SARS-CoV-2, NAA Not Detected Not Detected      Assessment & Plan:   Problem List Items Addressed This Visit       Cardiovascular and Mediastinum   Aortic atherosclerosis (HCC)   Will keep her BP and cholesterol under good control. Continue to monitor. Call with any concerns.       Relevant Medications   amLODipine (NORVASC) 10 MG  tablet   atenolol (TENORMIN) 50 MG tablet   atorvastatin (LIPITOR) 40 MG tablet   hydrALAZINE (APRESOLINE) 25 MG tablet   benazepril (LOTENSIN) 40 MG tablet   Other Relevant Orders   CBC with Differential/Platelet   Comprehensive metabolic panel     Respiratory   Allergic rhinitis   Will start singulair. Encouraged follow up with ENT. Call with any concerns.         Digestive   GERD (gastroesophageal reflux disease)   Under good control on current regimen. Continue current regimen. Continue to monitor. Call with any concerns. Refills given. Labs drawn today.       Relevant Medications   omeprazole (PRILOSEC) 40 MG capsule     Genitourinary   Benign hypertensive renal disease - Primary   Under good control on current regimen. Continue current regimen. Continue to monitor. Call with any concerns. Refills given. Labs drawn today.        Relevant Orders   CBC with Differential/Platelet   Comprehensive metabolic panel     Other   Hyperlipidemia   Under good control on current regimen. Continue current regimen. Continue to monitor. Call with any concerns. Refills given. Labs drawn today.       Relevant Medications   amLODipine (NORVASC) 10 MG tablet   atenolol (TENORMIN) 50 MG tablet   atorvastatin (LIPITOR) 40 MG tablet   hydrALAZINE (APRESOLINE) 25 MG tablet   benazepril (LOTENSIN) 40 MG tablet   Other Relevant Orders   Lipid Panel w/o Chol/HDL Ratio   CBC with Differential/Platelet    Comprehensive metabolic panel   Gout   Under good control on current regimen. Continue current regimen. Continue to monitor. Call with any concerns. Refills given. Labs drawn today.        Relevant Orders   CBC with Differential/Platelet   Comprehensive metabolic panel   Uric acid   GAD (generalized anxiety disorder)   Under good control on current regimen. Continue current regimen. Continue to monitor. Call with any concerns. Refills given. Labs drawn today.       Relevant Medications   amitriptyline (ELAVIL) 25 MG tablet   sertraline (ZOLOFT) 100 MG tablet   Malignant neoplasm of overlapping sites of left female breast [facility])   Continue to follow with oncology. Call with any concerns.         Follow up plan: Return in about 6 months (around 05/08/2024) for physical.

## 2023-11-06 NOTE — Assessment & Plan Note (Signed)
Will keep her BP and cholesterol under good control. Continue to monitor. Call with any concerns.  

## 2023-11-07 ENCOUNTER — Encounter: Payer: Self-pay | Admitting: Family Medicine

## 2023-11-07 LAB — CBC WITH DIFFERENTIAL/PLATELET
Basophils Absolute: 0 10*3/uL (ref 0.0–0.2)
Basos: 0 %
EOS (ABSOLUTE): 0.8 10*3/uL — ABNORMAL HIGH (ref 0.0–0.4)
Eos: 12 %
Hematocrit: 38.9 % (ref 34.0–46.6)
Hemoglobin: 12.3 g/dL (ref 11.1–15.9)
Immature Grans (Abs): 0 10*3/uL (ref 0.0–0.1)
Immature Granulocytes: 0 %
Lymphocytes Absolute: 1.7 10*3/uL (ref 0.7–3.1)
Lymphs: 25 %
MCH: 29.8 pg (ref 26.6–33.0)
MCHC: 31.6 g/dL (ref 31.5–35.7)
MCV: 94 fL (ref 79–97)
Monocytes Absolute: 0.4 10*3/uL (ref 0.1–0.9)
Monocytes: 6 %
Neutrophils Absolute: 3.9 10*3/uL (ref 1.4–7.0)
Neutrophils: 57 %
Platelets: 230 10*3/uL (ref 150–450)
RBC: 4.13 x10E6/uL (ref 3.77–5.28)
RDW: 14.1 % (ref 11.7–15.4)
WBC: 6.8 10*3/uL (ref 3.4–10.8)

## 2023-11-07 LAB — COMPREHENSIVE METABOLIC PANEL
ALT: 14 IU/L (ref 0–32)
AST: 19 IU/L (ref 0–40)
Albumin: 4.4 g/dL (ref 3.8–4.8)
Alkaline Phosphatase: 79 IU/L (ref 44–121)
BUN/Creatinine Ratio: 15 (ref 12–28)
BUN: 14 mg/dL (ref 8–27)
Bilirubin Total: 0.3 mg/dL (ref 0.0–1.2)
CO2: 25 mmol/L (ref 20–29)
Calcium: 8.9 mg/dL (ref 8.7–10.3)
Chloride: 101 mmol/L (ref 96–106)
Creatinine, Ser: 0.92 mg/dL (ref 0.57–1.00)
Globulin, Total: 2.3 g/dL (ref 1.5–4.5)
Glucose: 102 mg/dL — ABNORMAL HIGH (ref 70–99)
Potassium: 4.4 mmol/L (ref 3.5–5.2)
Sodium: 141 mmol/L (ref 134–144)
Total Protein: 6.7 g/dL (ref 6.0–8.5)
eGFR: 65 mL/min/{1.73_m2} (ref >59)

## 2023-11-07 LAB — LIPID PANEL W/O CHOL/HDL RATIO
Cholesterol, Total: 157 mg/dL (ref 100–199)
HDL: 53 mg/dL (ref >39)
LDL Chol Calc (NIH): 75 mg/dL (ref 0–99)
Triglycerides: 173 mg/dL — ABNORMAL HIGH (ref 0–149)
VLDL Cholesterol Cal: 29 mg/dL (ref 5–40)

## 2023-11-07 LAB — URIC ACID: Uric Acid: 6.3 mg/dL (ref 3.1–7.9)

## 2023-11-27 DIAGNOSIS — J3489 Other specified disorders of nose and nasal sinuses: Secondary | ICD-10-CM | POA: Diagnosis not present

## 2023-11-27 DIAGNOSIS — J310 Chronic rhinitis: Secondary | ICD-10-CM | POA: Diagnosis not present

## 2023-11-27 DIAGNOSIS — J302 Other seasonal allergic rhinitis: Secondary | ICD-10-CM | POA: Diagnosis not present

## 2023-12-11 DIAGNOSIS — H43813 Vitreous degeneration, bilateral: Secondary | ICD-10-CM | POA: Diagnosis not present

## 2023-12-11 DIAGNOSIS — Z961 Presence of intraocular lens: Secondary | ICD-10-CM | POA: Diagnosis not present

## 2024-02-12 ENCOUNTER — Other Ambulatory Visit: Payer: Self-pay | Admitting: Family Medicine

## 2024-02-12 DIAGNOSIS — F411 Generalized anxiety disorder: Secondary | ICD-10-CM

## 2024-02-14 NOTE — Telephone Encounter (Signed)
 Requested Prescriptions  Pending Prescriptions Disp Refills   amitriptyline  (ELAVIL ) 25 MG tablet [Pharmacy Med Name: Amitriptyline  HCl 25 MG Oral Tablet] 90 tablet 0    Sig: TAKE 1 TABLET BY MOUTH AT  BEDTIME     Psychiatry:  Antidepressants - Heterocyclics (TCAs) Passed - 02/14/2024  4:51 PM      Passed - Valid encounter within last 6 months    Recent Outpatient Visits           3 months ago Benign hypertensive renal disease   [place] [facility] [place], [place], DO

## 2024-02-21 ENCOUNTER — Other Ambulatory Visit: Payer: Self-pay | Admitting: Family Medicine

## 2024-02-22 NOTE — Telephone Encounter (Signed)
 Requested Prescriptions  Pending Prescriptions Disp Refills   montelukast  (SINGULAIR ) 10 MG tablet [Pharmacy Med Name: MONTELUKAST  10MG  TABLETS] 90 tablet 1    Sig: TAKE 1 TABLET(10 MG) BY MOUTH AT BEDTIME     Pulmonology:  Leukotriene Inhibitors Passed - 02/22/2024 12:59 PM      Passed - Valid encounter within last 12 months    Recent Outpatient Visits           3 months ago Benign hypertensive renal disease    [facility] [place], Megan P, DO

## 2024-02-25 ENCOUNTER — Other Ambulatory Visit: Payer: Self-pay | Admitting: Surgery

## 2024-02-25 DIAGNOSIS — Z1231 Encounter for screening mammogram for malignant neoplasm of breast: Secondary | ICD-10-CM

## 2024-03-06 ENCOUNTER — Other Ambulatory Visit: Payer: Self-pay | Admitting: Family Medicine

## 2024-03-06 DIAGNOSIS — F411 Generalized anxiety disorder: Secondary | ICD-10-CM

## 2024-03-08 NOTE — Telephone Encounter (Signed)
 Last refill 02/14/24 #90   Requested Prescriptions  Refused Prescriptions Disp Refills   amitriptyline  (ELAVIL ) 25 MG tablet [Pharmacy Med Name: Amitriptyline  HCl 25 MG Oral Tablet] 30 tablet 11    Sig: TAKE 1 TABLET BY MOUTH AT  BEDTIME     Psychiatry:  Antidepressants - Heterocyclics (TCAs) Passed - 03/08/2024 12:05 PM      Passed - Valid encounter within last 6 months    Recent Outpatient Visits           4 months ago Benign hypertensive renal disease   [facility] [place], [place], DO

## 2024-03-17 DIAGNOSIS — S300XXA Contusion of lower back and pelvis, initial encounter: Secondary | ICD-10-CM | POA: Diagnosis not present

## 2024-04-04 ENCOUNTER — Other Ambulatory Visit: Payer: Self-pay | Admitting: Family Medicine

## 2024-04-04 DIAGNOSIS — F411 Generalized anxiety disorder: Secondary | ICD-10-CM

## 2024-04-08 NOTE — Telephone Encounter (Signed)
 Rx 02/14/24 #90- too soon Requested Prescriptions  Pending Prescriptions Disp Refills   amitriptyline  (ELAVIL ) 25 MG tablet [Pharmacy Med Name: Amitriptyline  HCl 25 MG Oral Tablet] 30 tablet 11    Sig: TAKE 1 TABLET BY MOUTH AT  BEDTIME     Psychiatry:  Antidepressants - Heterocyclics (TCAs) Passed - 04/08/2024  3:21 PM      Passed - Valid encounter within last 6 months    Recent Outpatient Visits           5 months ago Benign hypertensive renal disease   [facility] [place], [place], DO

## 2024-04-22 DIAGNOSIS — M9905 Segmental and somatic dysfunction of pelvic region: Secondary | ICD-10-CM | POA: Diagnosis not present

## 2024-04-22 DIAGNOSIS — M9903 Segmental and somatic dysfunction of lumbar region: Secondary | ICD-10-CM | POA: Diagnosis not present

## 2024-04-22 DIAGNOSIS — M5441 Lumbago with sciatica, right side: Secondary | ICD-10-CM | POA: Diagnosis not present

## 2024-04-22 DIAGNOSIS — M9902 Segmental and somatic dysfunction of thoracic region: Secondary | ICD-10-CM | POA: Diagnosis not present

## 2024-04-22 DIAGNOSIS — M955 Acquired deformity of pelvis: Secondary | ICD-10-CM | POA: Diagnosis not present

## 2024-04-22 DIAGNOSIS — M546 Pain in thoracic spine: Secondary | ICD-10-CM | POA: Diagnosis not present

## 2024-04-23 DIAGNOSIS — M5441 Lumbago with sciatica, right side: Secondary | ICD-10-CM | POA: Diagnosis not present

## 2024-04-23 DIAGNOSIS — M955 Acquired deformity of pelvis: Secondary | ICD-10-CM | POA: Diagnosis not present

## 2024-04-23 DIAGNOSIS — M9905 Segmental and somatic dysfunction of pelvic region: Secondary | ICD-10-CM | POA: Diagnosis not present

## 2024-04-23 DIAGNOSIS — M9903 Segmental and somatic dysfunction of lumbar region: Secondary | ICD-10-CM | POA: Diagnosis not present

## 2024-04-23 DIAGNOSIS — M546 Pain in thoracic spine: Secondary | ICD-10-CM | POA: Diagnosis not present

## 2024-04-23 DIAGNOSIS — M9902 Segmental and somatic dysfunction of thoracic region: Secondary | ICD-10-CM | POA: Diagnosis not present

## 2024-04-25 ENCOUNTER — Encounter

## 2024-04-29 ENCOUNTER — Ambulatory Visit
Admission: RE | Admit: 2024-04-29 | Discharge: 2024-04-29 | Disposition: A | Discharge status: Home / Self Care | Source: Ambulatory Visit | Attending: Surgery | Admitting: Surgery

## 2024-04-29 DIAGNOSIS — Z1231 Encounter for screening mammogram for malignant neoplasm of breast: Secondary | ICD-10-CM | POA: Insufficient documentation

## 2024-04-30 ENCOUNTER — Other Ambulatory Visit: Payer: Self-pay | Admitting: Family Medicine

## 2024-04-30 DIAGNOSIS — F411 Generalized anxiety disorder: Secondary | ICD-10-CM

## 2024-04-30 DIAGNOSIS — M5441 Lumbago with sciatica, right side: Secondary | ICD-10-CM | POA: Diagnosis not present

## 2024-04-30 DIAGNOSIS — M546 Pain in thoracic spine: Secondary | ICD-10-CM | POA: Diagnosis not present

## 2024-04-30 DIAGNOSIS — M9903 Segmental and somatic dysfunction of lumbar region: Secondary | ICD-10-CM | POA: Diagnosis not present

## 2024-04-30 DIAGNOSIS — M9905 Segmental and somatic dysfunction of pelvic region: Secondary | ICD-10-CM | POA: Diagnosis not present

## 2024-04-30 DIAGNOSIS — M9902 Segmental and somatic dysfunction of thoracic region: Secondary | ICD-10-CM | POA: Diagnosis not present

## 2024-04-30 DIAGNOSIS — M955 Acquired deformity of pelvis: Secondary | ICD-10-CM | POA: Diagnosis not present

## 2024-05-01 NOTE — Telephone Encounter (Signed)
 Requested Prescriptions  Pending Prescriptions Disp Refills   amitriptyline  (ELAVIL ) 25 MG tablet [Pharmacy Med Name: Amitriptyline  HCl 25 MG Oral Tablet] 30 tablet 5    Sig: TAKE 1 TABLET BY MOUTH AT  BEDTIME     Psychiatry:  Antidepressants - Heterocyclics (TCAs) Passed - 05/01/2024  1:22 PM      Passed - Valid encounter within last 6 months    Recent Outpatient Visits           5 months ago Benign hypertensive renal disease   [facility] [place], [place], DO

## 2024-05-06 DIAGNOSIS — C50412 Malignant neoplasm of upper-outer quadrant of left female breast: Secondary | ICD-10-CM | POA: Diagnosis not present

## 2024-05-06 DIAGNOSIS — Z170 Estrogen receptor positive status [ER+]: Secondary | ICD-10-CM | POA: Diagnosis not present

## 2024-05-07 DIAGNOSIS — M5441 Lumbago with sciatica, right side: Secondary | ICD-10-CM | POA: Diagnosis not present

## 2024-05-07 DIAGNOSIS — M955 Acquired deformity of pelvis: Secondary | ICD-10-CM | POA: Diagnosis not present

## 2024-05-07 DIAGNOSIS — M546 Pain in thoracic spine: Secondary | ICD-10-CM | POA: Diagnosis not present

## 2024-05-07 DIAGNOSIS — M9903 Segmental and somatic dysfunction of lumbar region: Secondary | ICD-10-CM | POA: Diagnosis not present

## 2024-05-07 DIAGNOSIS — M9905 Segmental and somatic dysfunction of pelvic region: Secondary | ICD-10-CM | POA: Diagnosis not present

## 2024-05-07 DIAGNOSIS — M9902 Segmental and somatic dysfunction of thoracic region: Secondary | ICD-10-CM | POA: Diagnosis not present

## 2024-05-08 DIAGNOSIS — M7542 Impingement syndrome of left shoulder: Secondary | ICD-10-CM | POA: Diagnosis not present

## 2024-05-13 ENCOUNTER — Encounter: Payer: Self-pay | Admitting: Family Medicine

## 2024-05-13 ENCOUNTER — Ambulatory Visit: Admitting: Family Medicine

## 2024-05-13 VITALS — BP 128/70 | HR 68 | Ht 58.5 in | Wt 136.8 lb

## 2024-05-13 DIAGNOSIS — E782 Mixed hyperlipidemia: Secondary | ICD-10-CM | POA: Diagnosis not present

## 2024-05-13 DIAGNOSIS — F411 Generalized anxiety disorder: Secondary | ICD-10-CM

## 2024-05-13 DIAGNOSIS — I739 Peripheral vascular disease, unspecified: Secondary | ICD-10-CM

## 2024-05-13 DIAGNOSIS — Z0000 Encounter for general adult medical examination without abnormal findings: Secondary | ICD-10-CM

## 2024-05-13 DIAGNOSIS — Z23 Encounter for immunization: Secondary | ICD-10-CM | POA: Diagnosis not present

## 2024-05-13 DIAGNOSIS — I700 Atherosclerosis of aorta: Secondary | ICD-10-CM | POA: Diagnosis not present

## 2024-05-13 DIAGNOSIS — I2510 Atherosclerotic heart disease of native coronary artery without angina pectoris: Secondary | ICD-10-CM

## 2024-05-13 DIAGNOSIS — M1A9XX0 Chronic gout, unspecified, without tophus (tophi): Secondary | ICD-10-CM

## 2024-05-13 DIAGNOSIS — D692 Other nonthrombocytopenic purpura: Secondary | ICD-10-CM | POA: Diagnosis not present

## 2024-05-13 DIAGNOSIS — I129 Hypertensive chronic kidney disease with stage 1 through stage 4 chronic kidney disease, or unspecified chronic kidney disease: Secondary | ICD-10-CM

## 2024-05-13 DIAGNOSIS — K219 Gastro-esophageal reflux disease without esophagitis: Secondary | ICD-10-CM

## 2024-05-13 DIAGNOSIS — C50812 Malignant neoplasm of overlapping sites of left female breast: Secondary | ICD-10-CM

## 2024-05-13 LAB — MICROALBUMIN, URINE WAIVED
Creatinine, Urine Waived: 50 mg/dL (ref 10–300)
Microalb, Ur Waived: 10 mg/L (ref 0–19)
Microalb/Creat Ratio: <30 mg/g (ref <30)

## 2024-05-13 MED ORDER — MONTELUKAST SODIUM 10 MG PO TABS: 10.0000 mg | ORAL_TABLET | Freq: Every day | ORAL | 1 refills | Status: AC

## 2024-05-13 MED ORDER — ATENOLOL 50 MG PO TABS: 50.0000 mg | ORAL_TABLET | Freq: Every day | ORAL | 1 refills | Status: AC

## 2024-05-13 MED ORDER — OMEPRAZOLE 40 MG PO CPDR: DELAYED_RELEASE_CAPSULE | ORAL | 3 refills | Status: AC

## 2024-05-13 MED ORDER — HYDRALAZINE HCL 25 MG PO TABS: 25.0000 mg | ORAL_TABLET | Freq: Two times a day (BID) | ORAL | 1 refills | Status: AC

## 2024-05-13 MED ORDER — BENAZEPRIL HCL 40 MG PO TABS: 40.0000 mg | ORAL_TABLET | Freq: Every day | ORAL | 1 refills | Status: AC

## 2024-05-13 MED ORDER — AMITRIPTYLINE HCL 25 MG PO TABS
25.0000 mg | ORAL_TABLET | Freq: Every day | ORAL | 5 refills | Status: AC
Start: 2024-05-13 — End: ?

## 2024-05-13 MED ORDER — CLONAZEPAM 0.5 MG PO TABS: 0.2500 mg | ORAL_TABLET | Freq: Two times a day (BID) | ORAL | 0 refills | Status: AC | PRN

## 2024-05-13 MED ORDER — SERTRALINE HCL 100 MG PO TABS: 150.0000 mg | ORAL_TABLET | Freq: Every day | ORAL | 1 refills | Status: AC

## 2024-05-13 MED ORDER — AMLODIPINE BESYLATE 10 MG PO TABS: 10.0000 mg | ORAL_TABLET | Freq: Every day | ORAL | 1 refills | Status: AC

## 2024-05-13 MED ORDER — ATORVASTATIN CALCIUM 40 MG PO TABS: 40.0000 mg | ORAL_TABLET | Freq: Every day | ORAL | 1 refills | Status: AC

## 2024-05-13 NOTE — Progress Notes (Signed)
 Subjective:   Marie Parker is a 78 y.o. female who presents for Medicare Annual (Subsequent) preventive examination.  Visit Complete: In person  Patient Medicare AWV questionnaire was completed by the patient on 05/13/24; I have confirmed that all information answered by patient is correct and no changes since this date.        Objective:    Today's Vitals   05/13/24 0843 05/13/24 0921  BP: (!) 147/85 128/70  Pulse: 68   SpO2: 99%   Weight: 136 lb 12.8 oz (62.1 kg)   Height: 4' 10.5 (1.486 m)    Body mass index is 28.1 kg/m.     05/13/2024    9:16 AM 04/10/2022   11:34 AM 03/24/2022    9:27 AM 04/08/2021   11:18 AM 06/11/2020    8:47 AM 06/08/2020   11:43 AM 04/05/2020   11:19 AM  Advanced Directives  Does Patient Have a Medical Advance Directive? Yes Yes Yes Yes Yes Yes Yes  Type of Advance Directive  Living will Living will Living will Living will Living will Living will  Does patient want to make changes to medical advance directive? No - Patient declined    No - Patient declined No - Patient declined     Current Medications (verified) Outpatient Encounter Medications as of 05/13/2024  Medication Sig   acetaminophen  (TYLENOL ) 650 MG CR tablet Take 1,300 mg by mouth daily as needed for pain.    amitriptyline  (ELAVIL ) 25 MG tablet TAKE 1 TABLET BY MOUTH AT  BEDTIME   amLODipine  (NORVASC ) 10 MG tablet Take 1 tablet (10 mg total) by mouth daily.   atenolol  (TENORMIN ) 50 MG tablet Take 1 tablet (50 mg total) by mouth daily.   atorvastatin  (LIPITOR) 40 MG tablet Take 1 tablet (40 mg total) by mouth at bedtime.   benazepril  (LOTENSIN ) 40 MG tablet Take 1 tablet (40 mg total) by mouth daily.   CALCIUM -VITAMIN D  PO Take 2 tablets by mouth daily.   clobetasol  ointment (TEMOVATE ) 0.05 % Daily at night for 6-12 weeks, if improvement, decrease to 2-3x per week   clonazePAM  (KLONOPIN ) 0.5 MG tablet Take 0.5-1 tablets (0.25-0.5 mg total) by mouth 2 (two) times daily as needed for  anxiety.   hydrALAZINE  (APRESOLINE ) 25 MG tablet Take 1 tablet (25 mg total) by mouth 2 (two) times daily.   methocarbamol (ROBAXIN) 500 MG tablet Take by mouth.   montelukast  (SINGULAIR ) 10 MG tablet TAKE 1 TABLET(10 MG) BY MOUTH AT BEDTIME   Multiple Vitamin (MULTIVITAMIN WITH MINERALS) TABS tablet Take 1 tablet by mouth daily.   mupirocin  ointment (BACTROBAN ) 2 % Apply 1 Application topically 2 (two) times daily.   niacin 500 MG tablet Take 1,000 mg by mouth at bedtime.   Olopatadine-Mometasone (RYALTRIS ) 665-25 MCG/ACT SUSP Place 1 spray into the nose 2 (two) times daily.   omeprazole  (PRILOSEC) 40 MG capsule TAKE 1 CAPSULE BY MOUTH  TWICE DAILY BEFORE MEALS   Probiotic Product (PROBIOTIC-10 PO) Take by mouth daily.    sertraline  (ZOLOFT ) 100 MG tablet Take 1.5 tablets (150 mg total) by mouth daily.   UNABLE TO FIND Med Name: QC-10   No facility-administered encounter medications on file as of 05/13/2024.    Allergies (verified) Alendronate, Amoxicillin -pot clavulanate, Crestor [rosuvastatin], Cyclobenzaprine, Erythromycin, and Fosamax [alendronate sodium]   History: Past Medical History:  Diagnosis Date   Anxiety    Arthritis    Breast cancer (HCC)    Breast cancer of upper-outer quadrant of left female breast (  HCC) 04/22/2018   16 mm, T1c, N0.  ER/PR positive, HER-2/neu not overexpressed.  Low MammaPrint score.     CAD (coronary artery disease)    Cancer (HCC)    squamous cell   Cataract    Cholecystitis 04/10/2018   Chronic kidney disease    Colon cancer screening 2017?   negative cologuard   Depression    Family history of adverse reaction to anesthesia    mother had N/V   Generalized osteoarthritis    GERD (gastroesophageal reflux disease)    Gout    History of endoscopy 11/2012   Gastritis   Hyperlipidemia    Hypertension    IFG (impaired fasting glucose)    Insomnia    Menopause    Morton's neuroma of right foot 11/28/2019   Osteoporosis    Parathyroid  adenoma     Personal history of radiation therapy 2020   Left breast   Personal history of tobacco use, presenting hazards to health 02/02/2016   PVD (peripheral vascular disease) (HCC)    PVD (peripheral vascular disease) (HCC)    Rotator cuff syndrome of shoulder and allied disorders    Tobacco abuse    Past Surgical History:  Procedure Laterality Date   AIKEN OSTEOTOMY Right 06/11/2020   Procedure: DOUBLE OSTEOTOMY RIGHT;  Surgeon: Ashley Soulier, DPM;  Location: [facility] ORS;  Service: Podiatry;  Laterality: Right;   BREAST BIOPSY Left 2014   COMPLEX FIBROADENOMA   BREAST BIOPSY Left 04/05/2018   [facility]   BREAST EXCISIONAL BIOPSY Left 1970's   neg   BREAST FIBROADENOMA SURGERY     BREAST LUMPECTOMY     BREAST MAMMOSITE Left 04/2018   CARDIAC CATHETERIZATION  09/17/2007   Insignificant CAD with 40-50% stenosis of mid LAD.    CATARACT EXTRACTION, BILATERAL  12/2020   COLONOSCOPY WITH PROPOFOL  N/A 02/09/2020   Procedure: COLONOSCOPY WITH PROPOFOL ;  Surgeon: Unk Corinn Skiff, MD;  Location: [facility] ENDOSCOPY;  Service: Gastroenterology;  Laterality: N/A;   ESOPHAGOGASTRODUODENOSCOPY (EGD) WITH PROPOFOL  N/A 02/09/2020   Procedure: ESOPHAGOGASTRODUODENOSCOPY (EGD) WITH PROPOFOL ;  Surgeon: Unk Corinn Skiff, MD;  Location: [facility] ENDOSCOPY;  Service: Gastroenterology;  Laterality: N/A;   EXCISION MORTON'S NEUROMA Right 06/11/2020   Procedure: EXCISION MORTON'S NEUROMA;  Surgeon: Ashley Soulier, DPM;  Location: [facility] ORS;  Service: Podiatry;  Laterality: Right;   HAMMER TOE SURGERY Right 06/11/2020   Procedure: HAMMER TOE CORRECTION T6;  Surgeon: Ashley Soulier, DPM;  Location: [facility] ORS;  Service: Podiatry;  Laterality: Right;   ILIAC ARTERY STENT     right iliac artery stent    MASTECTOMY, PARTIAL Left 04/22/2018   Procedure: MASTECTOMY PARTIAL;  Surgeon: Dessa Reyes ORN, MD;  Location: [facility] ORS;  Service: General;  Laterality: Left;   ORIF PATELLA Left 03/22/2016   Dr. Ernestine   PARATHYROIDECTOMY   2012   [facility]   SENTINEL NODE BIOPSY Left 04/22/2018   Procedure: SENTINEL NODE BIOPSY;  Surgeon: Dessa Reyes ORN, MD;  Location: [facility] ORS;  Service: General;  Laterality: Left;   SKIN BIOPSY     precancerous   Family History  Problem Relation Age of Onset   Stroke Mother    Hypertension Mother    Diabetes Mother    Heart attack Father 104   Alcohol abuse Father    Heart disease Father    Liver disease Father    Hypertension Father    Heart disease Sister 40       CAD; s/p stent placement   Stroke Sister  AAA (abdominal aortic aneurysm) Sister    Dementia Sister    Breast cancer Maternal Aunt 97   Bone cancer Paternal Uncle    Stroke Maternal Grandmother    Heart attack Maternal Grandfather    Stroke Paternal Grandmother    Breast cancer Cousin        mat cousin   Social History   Socioeconomic History   Marital status: Married    Spouse name: Not on file   Number of children: Not on file   Years of education: Not on file   Highest education level: 12th grade  Occupational History   Occupation: part time [employer]   Tobacco Use   Smoking status: Former    Current packs/day: 0.00    Average packs/day: 1 pack/day for 42.0 years (42.0 ttl pk-yrs)    Types: Cigarettes    Start date: 08/28/1958    Quit date: 08/28/2000    Years since quitting: 23.7   Smokeless tobacco: Never  Vaping Use   Vaping status: Never Used  Substance and Sexual Activity   Alcohol use: No   Drug use: No   Sexual activity: Not Currently  Other Topics Concern   Not on file  Social History Narrative   Working part time   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    Social Drivers of Health   Financial Resource Strain: Low Risk  (11/06/2023)   Overall Financial Resource Strain (CARDIA)    Difficulty of Paying Living Expenses: Not hard at all  Food Insecurity: No Food Insecurity (05/13/2024)   Hunger Vital Sign    Worried About Running Out of Food in the Last Year: Never true    Ran  Out of Food in the Last Year: Never true  Transportation Needs: No Transportation Needs (05/13/2024)   PRAPARE - Administrator, Civil Service (Medical): No    Lack of Transportation (Non-Medical): No  Physical Activity: Unknown (11/06/2023)   Exercise Vital Sign    Days of Exercise per Week: 0 days    Minutes of Exercise per Session: Not on file  Stress: Stress Concern Present (11/06/2023)   [employer] of Occupational Health - Occupational Stress Questionnaire    Feeling of Stress : To some extent  Social Connections: Moderately Integrated (11/06/2023)   Social Connection and Isolation Panel    Frequency of Communication with Friends and Family: Once a week    Frequency of Social Gatherings with Friends and Family: Once a week    Attends Religious Services: More than 4 times per year    Active Member of [employer] or Organizations: Yes    Attends Banker Meetings: 1 to 4 times per year    Marital Status: Married    Tobacco Counseling Counseling given: Not Answered   Clinical Intake:                        Activities of Daily Living    05/13/2024    9:09 AM  In your present state of health, do you have any difficulty performing the following activities:  Hearing? 0  Vision? 0  Difficulty concentrating or making decisions? 1  Walking or climbing stairs? 1  Dressing or bathing? 0  Doing errands, shopping? 0    Patient Care Team: Vicci Duwaine SQUIBB, DO as PCP - General (Family Medicine) Darron Deatrice LABOR, MD as PCP - Cardiology (Cardiology) Cathlyn Seal, MD (Dermatology) Marcelino Havens, MD as Referring Physician (Internal Medicine)  Dessa Reyes ORN, MD (General Surgery) Lenn Aran, MD as Referring Physician (Radiation Oncology)  Indicate any recent Medical Services you may have received from other than [facility] providers in the past year (date may be approximate).     Assessment:   This is a routine wellness examination for  Siyona.  Hearing/Vision screen No results found.   Goals Addressed   None    Depression Screen    05/13/2024    9:08 AM 05/13/2024    8:50 AM 11/06/2023    8:49 AM 09/20/2023   10:05 AM 08/14/2023    1:10 PM 05/09/2023    8:25 AM 11/03/2022    8:49 AM  PHQ 2/9 Scores  PHQ - 2 Score 4 2 0 0 1 2 1   PHQ- 9 Score 6 2 2 2 2 3 4     Fall Risk    05/13/2024    9:07 AM 11/06/2023    8:48 AM 09/20/2023   10:05 AM 08/14/2023    1:10 PM 05/09/2023    8:25 AM  Fall Risk   Falls in the past year? 1 1 0 1 1  Number falls in past yr: 1 0 0 1 1  Injury with Fall? 1 0 1 1 0  Risk for fall due to : History of fall(s) No Fall Risks History of fall(s) History of fall(s) History of fall(s)  Follow up Falls prevention discussed Falls evaluation completed Falls evaluation completed;Education provided;Falls prevention discussed Falls evaluation completed Falls evaluation completed    MEDICARE RISK AT HOME:    TIMED UP AND GO:  Was the test performed?  Yes  Length of time to ambulate 10 feet: 15 sec Gait steady and fast without use of assistive device    Cognitive Function:        05/13/2024    9:14 AM 05/09/2023    8:53 AM 04/10/2022   11:36 AM 04/08/2021   11:21 AM 04/05/2020   11:23 AM  6CIT Screen  What Year? 0 points 0 points 0 points 0 points 0 points  What month? 0 points 0 points 0 points 0 points 0 points  What time? 0 points 0 points 0 points 0 points 0 points  Count back from 20 0 points 4 points 0 points 0 points 0 points  Months in reverse 0 points 0 points 0 points 0 points 0 points  Repeat phrase 2 points 2 points 0 points 0 points 4 points  Total Score 2 points 6 points 0 points 0 points 4 points    Immunizations Immunization History  Administered Date(s) Administered   Fluad Quad(high Dose 65+) 04/24/2019   Fluad Trivalent(High Dose 65+) 05/09/2023   INFLUENZA, HIGH DOSE SEASONAL PF 06/21/2016, 05/24/2018, 06/07/2020, 05/13/2024   Influenza,inj,Quad PF,6+ Mos  06/22/2015   Influenza-Unspecified 06/02/2014, 05/07/2017, 06/23/2021, 06/22/2022   Moderna Sars-Covid-2 Vaccination 08/08/2021   PFIZER(Purple Top)SARS-COV-2 Vaccination 10/24/2019, 11/14/2019, 05/31/2020   Pfizer(Comirnaty)Fall Seasonal Vaccine 12 years and older 05/22/2023, 05/13/2024   Pneumococcal Conjugate-13 09/01/2014   Pneumococcal Polysaccharide-23 12/11/2014   Td 04/10/2005, 12/21/2015   Tdap 04/30/2017   Unspecified SARS-COV-2 Vaccination 06/22/2022   Zoster, Live 08/11/2008    TDAP status: Up to date  Flu Vaccine status: Completed at today's visit  Pneumococcal vaccine status: Up to date  Covid-19 vaccine status: Completed vaccines  Qualifies for Shingles Vaccine? Yes   Zostavax completed No   Shingrix Completed?: No.    Education has been provided regarding the importance of this vaccine. Patient has been advised to  call insurance company to determine out of pocket expense if they have not yet received this vaccine. Advised may also receive vaccine at local pharmacy or Health Dept. Verbalized acceptance and understanding.  Screening Tests Health Maintenance  Topic Date Due   Zoster Vaccines- Shingrix (1 of 2) 06/27/1966   COVID-19 Vaccine (8 - 2025-26 season) 07/08/2024   Colonoscopy  02/08/2025   Medicare Annual Wellness (AWV)  05/13/2025   DEXA SCAN  09/27/2025   DTaP/Tdap/Td (4 - Td or Tdap) 05/01/2027   Pneumococcal Vaccine: 50+ Years  Completed   Influenza Vaccine  Completed   Hepatitis C Screening  Addressed   HPV VACCINES  Aged Out   Meningococcal B Vaccine  Aged Out   Mammogram  Discontinued   Fecal DNA (Cologuard)  Discontinued    Health Maintenance  Health Maintenance Due  Topic Date Due   Zoster Vaccines- Shingrix (1 of 2) 06/27/1966    Colorectal cancer screening: No longer applicable  Mammogram status: Completed 04/29/24. Repeat every year  Bone Density status: Completed 09/27/22. Results reflect: Bone density results: OSTEOPENIA. Repeat  every 3 years.  Lung Cancer Screening: (Low Dose CT Chest recommended if Age 74-80 years, 20 pack-year currently smoking OR have quit w/in 15years.) does not qualify.   Additional Screening:  Hepatitis C Screening: does qualify; Completed 12/11/14  Vision Screening: Recommended annual ophthalmology exams for early detection of glaucoma and other disorders of the eye.  Dental Screening: Recommended annual dental exams for proper oral hygiene  Community Resource Referral / Chronic Care Management: CRR required this visit?  No   CCM required this visit?  No     Plan:     I have personally reviewed and noted the following in the patient's chart:   Medical and social history Use of alcohol, tobacco or illicit drugs  Current medications and supplements including opioid prescriptions. Patient is not currently taking opioid prescriptions. Functional ability and status Nutritional status Physical activity Advanced directives List of other physicians Hospitalizations, surgeries, and ER visits in previous 12 months Vitals Screenings to include cognitive, depression, and falls Referrals and appointments  In addition, I have reviewed and discussed with patient certain preventive protocols, quality metrics, and best practice recommendations. A written personalized care plan for preventive services as well as general preventive health recommendations were provided to patient.     Duwaine Louder, DO   05/13/2024   After Visit Summary: (In Person-Printed) AVS printed and given to the patient

## 2024-05-13 NOTE — Assessment & Plan Note (Signed)
 Reassured patient. Continue to monitor.

## 2024-05-13 NOTE — Assessment & Plan Note (Signed)
 Acting up with her husband's death. Will refill her klonopin  and continue to monitor. Call with any concerns.

## 2024-05-13 NOTE — Assessment & Plan Note (Signed)
 Under good control on current regimen. Continue current regimen. Continue to monitor. Call with any concerns. Refills given. Labs drawn today.

## 2024-05-13 NOTE — Patient Instructions (Signed)
 Preventative Services:  Health Risk Assessment and Personalized Prevention Plan: Done today Bone Mass Measurements: up to date Breast Cancer Screening: up to date CVD Screening: done today Cervical Cancer Screening: N/A Colon Cancer Screening: N/A Depression Screening: done today Diabetes Screening: up to date Glaucoma Screening: see your eye doctor Hepatitis B vaccine: N/A Hepatitis C screening: up to date HIV Screening: up to date Flu Vaccine: given today Lung cancer Screening: up to date Obesity Screening: done today Pneumonia Vaccines (2): up to date STI Screening: N/A

## 2024-05-13 NOTE — Assessment & Plan Note (Signed)
 Will keep her BP and cholesterol under good control. Call with any concerns. Continue to monitor.

## 2024-05-13 NOTE — Assessment & Plan Note (Signed)
 Continue to follow with oncology. Call with any concerns.

## 2024-05-13 NOTE — Progress Notes (Signed)
 BP 128/70   Pulse 68   Ht 4' 10.5 (1.486 m)   Wt 136 lb 12.8 oz (62.1 kg)   SpO2 99%   BMI 28.10 kg/m    Subjective:    Patient ID: Marie Parker, female    DOB: 1946/01/30, 78 y.o.   MRN: [id number]  HPI: Marie Parker is a 78 y.o. female presenting on 05/13/2024 for comprehensive medical examination. Current medical complaints include:  HYPERTENSION / HYPERLIPIDEMIA Satisfied with current treatment? yes Duration of hypertension: chronic BP monitoring frequency: not checking BP medication side effects: no Past BP meds: amlodipine , atenolol , benazepril , hydralazine  Duration of hyperlipidemia: chronic Cholesterol medication side effects: no Cholesterol supplements: none Past cholesterol medications: atorvastatin  Medication compliance: excellent compliance Aspirin : no Recent stressors: no Recurrent headaches: no Visual changes: no Palpitations: no Dyspnea: no Chest pain: no Lower extremity edema: no Dizzy/lightheaded: no  ANXIETY/DEPRESSION- lost her husband this year. Doing OK but having more trouble Duration: chronic Status:exacerbated Anxious mood: yes  Excessive worrying: no Irritability: no  Sweating: no Nausea: no Palpitations:no Hyperventilation: no Panic attacks: no Agoraphobia: no  Obscessions/compulsions: no Depressed mood: yes    05/13/2024    9:08 AM 05/13/2024    8:50 AM 11/06/2023    8:49 AM 09/20/2023   10:05 AM 08/14/2023    1:10 PM  Depression screen PHQ 2/9  Decreased Interest 2 1 0 0 1  Down, Depressed, Hopeless 2 1 0 0 0  PHQ - 2 Score 4 2 0 0 1  Altered sleeping 1 0 1 1 1   Tired, decreased energy 1 0 1 1 0  Change in appetite 0 0 0 0 0  Feeling bad or failure about yourself  0 0 0 0 0  Trouble concentrating 0 0 0 0 0  Moving slowly or fidgety/restless 0 0 0 0 0  Suicidal thoughts 0 0 0 0 0  PHQ-9 Score 6 2 2 2 2   Difficult doing work/chores Somewhat difficult   Not difficult at all Not difficult at all   Anhedonia:  no Weight changes: no Insomnia: no   Hypersomnia: no Fatigue/loss of energy: yes Feelings of worthlessness: no Feelings of guilt: no Impaired concentration/indecisiveness: no Suicidal ideations: no  Crying spells: yes Recent Stressors/Life Changes: yes   Relationship problems: no   Family stress: yes     Financial stress: yes    Job stress: no    Recent death/loss: yes  She currently lives with: alone Menopausal Symptoms: no  Functional Status Survey: Is the patient deaf or have difficulty hearing?: No Does the patient have difficulty seeing, even when wearing glasses/contacts?: No Does the patient have difficulty concentrating, remembering, or making decisions?: Yes Does the patient have difficulty walking or climbing stairs?: Yes Does the patient have difficulty dressing or bathing?: No Does the patient have difficulty doing errands alone such as visiting a doctor's office or shopping?: No     05/13/2024    9:07 AM 11/06/2023    8:48 AM 09/20/2023   10:05 AM 08/14/2023    1:10 PM 05/09/2023    8:25 AM  Fall Risk   Falls in the past year? 1 1 0 1 1  Number falls in past yr: 1 0 0 1 1  Injury with Fall? 1 0 1 1 0  Risk for fall due to : History of fall(s) No Fall Risks History of fall(s) History of fall(s) History of fall(s)  Follow up Falls prevention discussed Falls evaluation completed Falls evaluation completed;Education provided;Falls prevention discussed  Falls evaluation completed Falls evaluation completed    Depression Screen    05/13/2024    9:08 AM 05/13/2024    8:50 AM 11/06/2023    8:49 AM 09/20/2023   10:05 AM 08/14/2023    1:10 PM  Depression screen PHQ 2/9  Decreased Interest 2 1 0 0 1  Down, Depressed, Hopeless 2 1 0 0 0  PHQ - 2 Score 4 2 0 0 1  Altered sleeping 1 0 1 1 1   Tired, decreased energy 1 0 1 1 0  Change in appetite 0 0 0 0 0  Feeling bad or failure about yourself  0 0 0 0 0  Trouble concentrating 0 0 0 0 0  Moving slowly or  fidgety/restless 0 0 0 0 0  Suicidal thoughts 0 0 0 0 0  PHQ-9 Score 6 2 2 2 2   Difficult doing work/chores Somewhat difficult   Not difficult at all Not difficult at all     Advanced Directives Does patient have a HCPOA?    yes If yes, name and contact information:  Does patient have a living will or MOST form?  yes  Past Medical History:  Past Medical History:  Diagnosis Date   Anxiety    Arthritis    Breast cancer (HCC)    Breast cancer of upper-outer quadrant of left female breast (HCC) 04/22/2018   16 mm, T1c, N0.  ER/PR positive, HER-2/neu not overexpressed.  Low MammaPrint score.     CAD (coronary artery disease)    Cancer (HCC)    squamous cell   Cataract    Cholecystitis 04/10/2018   Chronic kidney disease    Colon cancer screening 2017?   negative cologuard   Depression    Family history of adverse reaction to anesthesia    mother had N/V   Generalized osteoarthritis    GERD (gastroesophageal reflux disease)    Gout    History of endoscopy 11/2012   Gastritis   Hyperlipidemia    Hypertension    IFG (impaired fasting glucose)    Insomnia    Menopause    Morton's neuroma of right foot 11/28/2019   Osteoporosis    Parathyroid  adenoma    Personal history of radiation therapy 2020   Left breast   Personal history of tobacco use, presenting hazards to health 02/02/2016   PVD (peripheral vascular disease) (HCC)    PVD (peripheral vascular disease) (HCC)    Rotator cuff syndrome of shoulder and allied disorders    Tobacco abuse     Surgical History:  Past Surgical History:  Procedure Laterality Date   AIKEN OSTEOTOMY Right 06/11/2020   Procedure: DOUBLE OSTEOTOMY RIGHT;  Surgeon: Ashley Soulier, DPM;  Location: [facility] ORS;  Service: Podiatry;  Laterality: Right;   BREAST BIOPSY Left 2014   COMPLEX FIBROADENOMA   BREAST BIOPSY Left 04/05/2018   [facility]   BREAST EXCISIONAL BIOPSY Left 1970's   neg   BREAST FIBROADENOMA SURGERY     BREAST LUMPECTOMY     BREAST  MAMMOSITE Left 04/2018   CARDIAC CATHETERIZATION  09/17/2007   Insignificant CAD with 40-50% stenosis of mid LAD.    CATARACT EXTRACTION, BILATERAL  12/2020   COLONOSCOPY WITH PROPOFOL  N/A 02/09/2020   Procedure: COLONOSCOPY WITH PROPOFOL ;  Surgeon: Unk Corinn Skiff, MD;  Location: [facility] ENDOSCOPY;  Service: Gastroenterology;  Laterality: N/A;   ESOPHAGOGASTRODUODENOSCOPY (EGD) WITH PROPOFOL  N/A 02/09/2020   Procedure: ESOPHAGOGASTRODUODENOSCOPY (EGD) WITH PROPOFOL ;  Surgeon: Unk Corinn Skiff, MD;  Location: [facility]  ENDOSCOPY;  Service: Gastroenterology;  Laterality: N/A;   EXCISION MORTON'S NEUROMA Right 06/11/2020   Procedure: EXCISION MORTON'S NEUROMA;  Surgeon: Ashley Soulier, DPM;  Location: [facility] ORS;  Service: Podiatry;  Laterality: Right;   HAMMER TOE SURGERY Right 06/11/2020   Procedure: HAMMER TOE CORRECTION T6;  Surgeon: Ashley Soulier, DPM;  Location: [facility] ORS;  Service: Podiatry;  Laterality: Right;   ILIAC ARTERY STENT     right iliac artery stent    MASTECTOMY, PARTIAL Left 04/22/2018   Procedure: MASTECTOMY PARTIAL;  Surgeon: Dessa Reyes ORN, MD;  Location: [facility] ORS;  Service: General;  Laterality: Left;   ORIF PATELLA Left 03/22/2016   Dr. Ernestine   PARATHYROIDECTOMY  2012   [facility]   SENTINEL NODE BIOPSY Left 04/22/2018   Procedure: SENTINEL NODE BIOPSY;  Surgeon: Dessa Reyes ORN, MD;  Location: [facility] ORS;  Service: General;  Laterality: Left;   SKIN BIOPSY     precancerous    Medications:  Current Outpatient Medications on File Prior to Visit  Medication Sig   acetaminophen  (TYLENOL ) 650 MG CR tablet Take 1,300 mg by mouth daily as needed for pain.    CALCIUM -VITAMIN D  PO Take 2 tablets by mouth daily.   clobetasol  ointment (TEMOVATE ) 0.05 % Daily at night for 6-12 weeks, if improvement, decrease to 2-3x per week   methocarbamol (ROBAXIN) 500 MG tablet Take by mouth.   Multiple Vitamin (MULTIVITAMIN WITH MINERALS) TABS tablet Take 1 tablet by mouth daily.    mupirocin  ointment (BACTROBAN ) 2 % Apply 1 Application topically 2 (two) times daily.   niacin 500 MG tablet Take 1,000 mg by mouth at bedtime.   Olopatadine-Mometasone (RYALTRIS ) 665-25 MCG/ACT SUSP Place 1 spray into the nose 2 (two) times daily.   Probiotic Product (PROBIOTIC-10 PO) Take by mouth daily.    UNABLE TO FIND Med Name: [id number]   No current facility-administered medications on file prior to visit.    Allergies:  Allergies  Allergen Reactions   Alendronate Nausea Only and Other (See Comments)    Other reaction(s): NAUSEA GI upset   Amoxicillin -Pot Clavulanate Diarrhea   Crestor [Rosuvastatin] Nausea Only   Cyclobenzaprine Other (See Comments)    Vertigo, couldn't focus eyes   Erythromycin Other (See Comments)    GI upset    Fosamax [Alendronate Sodium] Nausea And Vomiting    Social History:  Social History   Socioeconomic History   Marital status: Married    Spouse name: Not on file   Number of children: Not on file   Years of education: Not on file   Highest education level: 12th grade  Occupational History   Occupation: part time [employer]   Tobacco Use   Smoking status: Former    Current packs/day: 0.00    Average packs/day: 1 pack/day for 42.0 years (42.0 ttl pk-yrs)    Types: Cigarettes    Start date: 08/28/1958    Quit date: 08/28/2000    Years since quitting: 23.7   Smokeless tobacco: Never  Vaping Use   Vaping status: Never Used  Substance and Sexual Activity   Alcohol use: No   Drug use: No   Sexual activity: Not Currently  Other Topics Concern   Not on file  Social History Narrative   Working part time   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    Social Drivers of Health   Financial Resource Strain: Low Risk  (11/06/2023)   Overall Financial Resource Strain (CARDIA)    Difficulty of Paying Living Expenses:  Not hard at all  Food Insecurity: No Food Insecurity (05/13/2024)   Hunger Vital Sign    Worried About Running Out of Food in the  Last Year: Never true    Ran Out of Food in the Last Year: Never true  Transportation Needs: No Transportation Needs (05/13/2024)   PRAPARE - Administrator, Civil Service (Medical): No    Lack of Transportation (Non-Medical): No  Physical Activity: Unknown (11/06/2023)   Exercise Vital Sign    Days of Exercise per Week: 0 days    Minutes of Exercise per Session: Not on file  Stress: Stress Concern Present (11/06/2023)   [employer] of Occupational Health - Occupational Stress Questionnaire    Feeling of Stress : To some extent  Social Connections: Moderately Integrated (11/06/2023)   Social Connection and Isolation Panel    Frequency of Communication with Friends and Family: Once a week    Frequency of Social Gatherings with Friends and Family: Once a week    Attends Religious Services: More than 4 times per year    Active Member of [employer] or Organizations: Yes    Attends Banker Meetings: 1 to 4 times per year    Marital Status: Married  Catering manager Violence: Not At Risk (04/10/2022)   Humiliation, Afraid, Rape, and Kick questionnaire    Fear of Current or Ex-Partner: No    Emotionally Abused: No    Physically Abused: No    Sexually Abused: No   Social History   Tobacco Use  Smoking Status Former   Current packs/day: 0.00   Average packs/day: 1 pack/day for 42.0 years (42.0 ttl pk-yrs)   Types: Cigarettes   Start date: 08/28/1958   Quit date: 08/28/2000   Years since quitting: 23.7  Smokeless Tobacco Never   Social History   Substance and Sexual Activity  Alcohol Use No    Family History:  Family History  Problem Relation Age of Onset   Stroke Mother    Hypertension Mother    Diabetes Mother    Heart attack Father 39   Alcohol abuse Father    Heart disease Father    Liver disease Father    Hypertension Father    Heart disease Sister 74       CAD; s/p stent placement   Stroke Sister    AAA (abdominal aortic aneurysm) Sister     Dementia Sister    Breast cancer Maternal Aunt 46   Bone cancer Paternal Uncle    Stroke Maternal Grandmother    Heart attack Maternal Grandfather    Stroke Paternal Grandmother    Breast cancer Cousin        mat cousin    Past medical history, surgical history, medications, allergies, family history and social history reviewed with patient today and changes made to appropriate areas of the chart.   Review of Systems  Constitutional: Negative.   HENT:  Positive for ear pain. Negative for congestion, ear discharge, hearing loss, nosebleeds, sinus pain, sore throat and tinnitus.        + dry mouth  Eyes: Negative.   Respiratory:  Positive for shortness of breath. Negative for cough, hemoptysis, sputum production, wheezing and stridor.   Cardiovascular: Negative.   Gastrointestinal:  Positive for abdominal pain and heartburn. Negative for blood in stool, constipation, diarrhea, melena, nausea and vomiting.  Genitourinary: Negative.   Musculoskeletal: Negative.   Skin: Negative.   Neurological:  Positive for tingling. Negative for dizziness, tremors, sensory  change, speech change, focal weakness, seizures, loss of consciousness, weakness and headaches.  Endo/Heme/Allergies:  Negative for environmental allergies and polydipsia. Bruises/bleeds easily.  Psychiatric/Behavioral:  Positive for depression. Negative for hallucinations, memory loss, substance abuse and suicidal ideas. The patient is not nervous/anxious and does not have insomnia.     All other ROS negative except what is listed above and in the HPI.      Objective:    BP 128/70   Pulse 68   Ht 4' 10.5 (1.486 m)   Wt 136 lb 12.8 oz (62.1 kg)   SpO2 99%   BMI 28.10 kg/m   Wt Readings from Last 3 Encounters:  05/13/24 136 lb 12.8 oz (62.1 kg)  11/06/23 137 lb (62.1 kg)  10/05/23 137 lb 2 oz (62.2 kg)     Physical Exam Vitals and nursing note reviewed.  Constitutional:      General: She is not in acute distress.     Appearance: Normal appearance. She is normal weight. She is not ill-appearing, toxic-appearing or diaphoretic.  HENT:     Head: Normocephalic and atraumatic.     Right Ear: Tympanic membrane, ear canal and external ear normal. There is no impacted cerumen.     Left Ear: Tympanic membrane, ear canal and external ear normal. There is no impacted cerumen.     Nose: Nose normal. No congestion or rhinorrhea.     Mouth/Throat:     Mouth: Mucous membranes are moist.     Pharynx: Oropharynx is clear. No oropharyngeal exudate or posterior oropharyngeal erythema.  Eyes:     General: No scleral icterus.       Right eye: No discharge.        Left eye: No discharge.     Extraocular Movements: Extraocular movements intact.     Conjunctiva/sclera: Conjunctivae normal.     Pupils: Pupils are equal, round, and reactive to light.  Neck:     Vascular: No carotid bruit.  Cardiovascular:     Rate and Rhythm: Normal rate and regular rhythm.     Pulses: Normal pulses.     Heart sounds: No murmur heard.    No friction rub. No gallop.  Pulmonary:     Effort: Pulmonary effort is normal. No respiratory distress.     Breath sounds: Normal breath sounds. No stridor. No wheezing, rhonchi or rales.  Chest:     Chest wall: No tenderness.  Abdominal:     General: Abdomen is flat. Bowel sounds are normal. There is no distension.     Palpations: Abdomen is soft. There is no mass.     Tenderness: There is no abdominal tenderness. There is no right CVA tenderness, left CVA tenderness, guarding or rebound.     Hernia: No hernia is present.  Genitourinary:    Comments: Breast and pelvic exams deferred with shared decision making Musculoskeletal:        General: No swelling, tenderness, deformity or signs of injury.     Cervical back: Normal range of motion and neck supple. No rigidity. No muscular tenderness.     Right lower leg: No edema.     Left lower leg: No edema.  Lymphadenopathy:     Cervical: No cervical  adenopathy.  Skin:    General: Skin is warm and dry.     Capillary Refill: Capillary refill takes less than 2 seconds.     Coloration: Skin is not jaundiced or pale.     Findings: No bruising, erythema, lesion or  rash.  Neurological:     General: No focal deficit present.     Mental Status: She is alert and oriented to person, place, and time. Mental status is at baseline.     Cranial Nerves: No cranial nerve deficit.     Sensory: No sensory deficit.     Motor: No weakness.     Coordination: Coordination normal.     Gait: Gait normal.     Deep Tendon Reflexes: Reflexes normal.  Psychiatric:        Mood and Affect: Mood normal.        Behavior: Behavior normal.        Thought Content: Thought content normal.        Judgment: Judgment normal.        05/13/2024    9:14 AM 05/09/2023    8:53 AM 04/10/2022   11:36 AM 04/08/2021   11:21 AM 04/05/2020   11:23 AM  6CIT Screen  What Year? 0 points 0 points 0 points 0 points 0 points  What month? 0 points 0 points 0 points 0 points 0 points  What time? 0 points 0 points 0 points 0 points 0 points  Count back from 20 0 points 4 points 0 points 0 points 0 points  Months in reverse 0 points 0 points 0 points 0 points 0 points  Repeat phrase 2 points 2 points 0 points 0 points 4 points  Total Score 2 points 6 points 0 points 0 points 4 points    Results for orders placed or performed in visit on 11/06/23  Lipid Panel w/o Chol/HDL Ratio   Collection Time: 11/06/23  9:16 AM  Result Value Ref Range   Cholesterol, Total 157 100 - 199 mg/dL   Triglycerides 826 (H) 0 - 149 mg/dL   HDL 53 >60 mg/dL   VLDL Cholesterol Cal 29 5 - 40 mg/dL   LDL Chol Calc (NIH) 75 0 - 99 mg/dL  CBC with Differential/Platelet   Collection Time: 11/06/23  9:16 AM  Result Value Ref Range   WBC 6.8 3.4 - 10.8 x10E3/uL   RBC 4.13 3.77 - 5.28 x10E6/uL   Hemoglobin 12.3 11.1 - 15.9 g/dL   Hematocrit 61.0 65.9 - 46.6 %   MCV 94 79 - 97 fL   MCH 29.8 26.6 - 33.0 pg    MCHC 31.6 31.5 - 35.7 g/dL   RDW 85.8 88.2 - 84.5 %   Platelets 230 150 - 450 x10E3/uL   Neutrophils 57 Not Estab. %   Lymphs 25 Not Estab. %   Monocytes 6 Not Estab. %   Eos 12 Not Estab. %   Basos 0 Not Estab. %   Neutrophils Absolute 3.9 1.4 - 7.0 x10E3/uL   Lymphocytes Absolute 1.7 0.7 - 3.1 x10E3/uL   Monocytes Absolute 0.4 0.1 - 0.9 x10E3/uL   EOS (ABSOLUTE) 0.8 (H) 0.0 - 0.4 x10E3/uL   Basophils Absolute 0.0 0.0 - 0.2 x10E3/uL   Immature Granulocytes 0 Not Estab. %   Immature Grans (Abs) 0.0 0.0 - 0.1 x10E3/uL  Comprehensive metabolic panel   Collection Time: 11/06/23  9:16 AM  Result Value Ref Range   Glucose 102 (H) 70 - 99 mg/dL   BUN 14 8 - 27 mg/dL   Creatinine, Ser 9.07 0.57 - 1.00 mg/dL   eGFR 65 >40 fO/fpw/8.26   BUN/Creatinine Ratio 15 12 - 28   Sodium 141 134 - 144 mmol/L   Potassium 4.4 3.5 - 5.2 mmol/L   Chloride  101 96 - 106 mmol/L   CO2 25 20 - 29 mmol/L   Calcium  8.9 8.7 - 10.3 mg/dL   Total Protein 6.7 6.0 - 8.5 g/dL   Albumin 4.4 3.8 - 4.8 g/dL   Globulin, Total 2.3 1.5 - 4.5 g/dL   Bilirubin Total 0.3 0.0 - 1.2 mg/dL   Alkaline Phosphatase 79 44 - 121 IU/L   AST 19 0 - 40 IU/L   ALT 14 0 - 32 IU/L  Uric acid   Collection Time: 11/06/23  9:16 AM  Result Value Ref Range   Uric Acid 6.3 3.1 - 7.9 mg/dL      Assessment & Plan:   Problem List Items Addressed This Visit       Cardiovascular and Mediastinum   Peripheral vascular disease (HCC)   Will keep her BP and cholesterol under good control. Call with any concerns. Continue to monitor.       Relevant Medications   amLODipine  (NORVASC ) 10 MG tablet   atenolol  (TENORMIN ) 50 MG tablet   atorvastatin  (LIPITOR) 40 MG tablet   benazepril  (LOTENSIN ) 40 MG tablet   hydrALAZINE  (APRESOLINE ) 25 MG tablet   Other Relevant Orders   CBC with Differential/Platelet   Comprehensive metabolic panel with GFR   CAD (coronary artery disease)   Will keep her BP and cholesterol under good control. Call  with any concerns. Continue to monitor.       Relevant Medications   amLODipine  (NORVASC ) 10 MG tablet   atenolol  (TENORMIN ) 50 MG tablet   atorvastatin  (LIPITOR) 40 MG tablet   benazepril  (LOTENSIN ) 40 MG tablet   hydrALAZINE  (APRESOLINE ) 25 MG tablet   Other Relevant Orders   CBC with Differential/Platelet   Comprehensive metabolic panel with GFR   Senile purpura (HCC)   Reassured patient. Continue to monitor.       Relevant Medications   amLODipine  (NORVASC ) 10 MG tablet   atenolol  (TENORMIN ) 50 MG tablet   atorvastatin  (LIPITOR) 40 MG tablet   benazepril  (LOTENSIN ) 40 MG tablet   hydrALAZINE  (APRESOLINE ) 25 MG tablet   Other Relevant Orders   CBC with Differential/Platelet   Comprehensive metabolic panel with GFR   Aortic atherosclerosis (HCC)   Will keep her BP and cholesterol under good control. Call with any concerns.       Relevant Medications   amLODipine  (NORVASC ) 10 MG tablet   atenolol  (TENORMIN ) 50 MG tablet   atorvastatin  (LIPITOR) 40 MG tablet   benazepril  (LOTENSIN ) 40 MG tablet   hydrALAZINE  (APRESOLINE ) 25 MG tablet   Other Relevant Orders   CBC with Differential/Platelet   Comprehensive metabolic panel with GFR     Digestive   GERD (gastroesophageal reflux disease)   Under good control on current regimen. Continue current regimen. Continue to monitor. Call with any concerns. Refills given. Labs drawn today.        Relevant Medications   omeprazole  (PRILOSEC) 40 MG capsule   Other Relevant Orders   CBC with Differential/Platelet   Comprehensive metabolic panel with GFR     Genitourinary   Benign hypertensive renal disease   Under good control on current regimen. Continue current regimen. Continue to monitor. Call with any concerns. Refills given. Labs drawn today.        Relevant Orders   Microalbumin, Urine Waived   CBC with Differential/Platelet   Comprehensive metabolic panel with GFR   TSH     Other   Hyperlipidemia   Under good  control on current regimen. Continue  current regimen. Continue to monitor. Call with any concerns. Refills given. Labs drawn today.       Relevant Medications   amLODipine  (NORVASC ) 10 MG tablet   atenolol  (TENORMIN ) 50 MG tablet   atorvastatin  (LIPITOR) 40 MG tablet   benazepril  (LOTENSIN ) 40 MG tablet   hydrALAZINE  (APRESOLINE ) 25 MG tablet   Other Relevant Orders   CBC with Differential/Platelet   Comprehensive metabolic panel with GFR   Lipid Panel w/o Chol/HDL Ratio   Gout   Under good control on current regimen. Continue current regimen. Continue to monitor. Call with any concerns. Refills given. Labs drawn today.       Relevant Orders   CBC with Differential/Platelet   Comprehensive metabolic panel with GFR   Uric acid   GAD (generalized anxiety disorder)   Acting up with her husband's death. Will refill her klonopin  and continue to monitor. Call with any concerns.       Relevant Medications   amitriptyline  (ELAVIL ) 25 MG tablet   sertraline  (ZOLOFT ) 100 MG tablet   Other Relevant Orders   CBC with Differential/Platelet   Comprehensive metabolic panel with GFR   Malignant neoplasm of overlapping sites of left female breast [facility])   Continue to follow with oncology. Call with any concerns.       Relevant Orders   CBC with Differential/Platelet   Comprehensive metabolic panel with GFR   Other Visit Diagnoses       Encounter for Medicare annual wellness exam    -  Primary   Preventative care discussed today as below.     Routine general medical examination at a health care facility       Vaccines updated. Screening labs checked today. Mammo and DEXA up to date. Continue diet and exercise. Call with any concerns.     Need for COVID-19 vaccine       Covid shot given.   Relevant Orders   Pfizer Comirnaty Covid -19 Vaccine 54yrs and older (Completed)     Needs flu shot       Flu shot given.   Relevant Orders   Flu vaccine HIGH DOSE PF(Fluzone Trivalent) (Completed)         Preventative Services:  Health Risk Assessment and Personalized Prevention Plan: Done today Bone Mass Measurements: up to date Breast Cancer Screening: up to date CVD Screening: done today Cervical Cancer Screening: N/A Colon Cancer Screening: N/A Depression Screening: done today Diabetes Screening: up to date Glaucoma Screening: see your eye doctor Hepatitis B vaccine: N/A Hepatitis C screening: up to date HIV Screening: up to date Flu Vaccine: given today Lung cancer Screening: up to date Obesity Screening: done today Pneumonia Vaccines (2): up to date STI Screening: N/A  Follow up plan: Return in about 6 months (around 11/10/2024).   LABORATORY TESTING:  - Pap smear: not applicable  IMMUNIZATIONS:   - Tdap: Tetanus vaccination status reviewed: last tetanus booster within 10 years. - Influenza: Administered today - Pneumovax: Up to date - Prevnar: Up to date - Zostavax vaccine: Refused  SCREENING: -Mammogram: Up to date  - Colonoscopy: Up to date  - Bone Density: Up to date   PATIENT COUNSELING:   Advised to take 1 mg of folate supplement per day if capable of pregnancy.   Sexuality: Discussed sexually transmitted diseases, partner selection, use of condoms, avoidance of unintended pregnancy  and contraceptive alternatives.   Advised to avoid cigarette smoking.  I discussed with the patient that most people either abstain from  alcohol or drink within safe limits (<=14/week and <=4 drinks/occasion for males, <=7/weeks and <= 3 drinks/occasion for females) and that the risk for alcohol disorders and other health effects rises proportionally with the number of drinks per week and how often a drinker exceeds daily limits.  Discussed cessation/primary prevention of drug use and availability of treatment for abuse.   Diet: Encouraged to adjust caloric intake to maintain  or achieve ideal body weight, to reduce intake of dietary saturated fat and total fat, to  limit sodium intake by avoiding high sodium foods and not adding table salt, and to maintain adequate dietary potassium and calcium  preferably from fresh fruits, vegetables, and low-fat dairy products.    stressed the importance of regular exercise  Injury prevention: Discussed safety belts, safety helmets, smoke detector, smoking near bedding or upholstery.   Dental health: Discussed importance of regular tooth brushing, flossing, and dental visits.    NEXT PREVENTATIVE PHYSICAL DUE IN 1 YEAR. Return in about 6 months (around 11/10/2024).

## 2024-05-13 NOTE — Assessment & Plan Note (Signed)
Will keep her BP and cholesterol under good control. Call with any concerns.

## 2024-05-14 ENCOUNTER — Ambulatory Visit: Payer: Self-pay | Admitting: Family Medicine

## 2024-05-14 LAB — LIPID PANEL W/O CHOL/HDL RATIO
Cholesterol, Total: 130 mg/dL (ref 100–199)
HDL: 57 mg/dL (ref >39)
LDL Chol Calc (NIH): 56 mg/dL (ref 0–99)
Triglycerides: 87 mg/dL (ref 0–149)
VLDL Cholesterol Cal: 17 mg/dL (ref 5–40)

## 2024-05-14 LAB — CBC WITH DIFFERENTIAL/PLATELET
Basophils Absolute: 0 x10E3/uL (ref 0.0–0.2)
Basos: 0 %
EOS (ABSOLUTE): 0.2 x10E3/uL (ref 0.0–0.4)
Eos: 3 %
Hematocrit: 38.1 % (ref 34.0–46.6)
Hemoglobin: 11.9 g/dL (ref 11.1–15.9)
Immature Grans (Abs): 0 x10E3/uL (ref 0.0–0.1)
Immature Granulocytes: 0 %
Lymphocytes Absolute: 1.8 x10E3/uL (ref 0.7–3.1)
Lymphs: 23 %
MCH: 28.6 pg (ref 26.6–33.0)
MCHC: 31.2 g/dL — ABNORMAL LOW (ref 31.5–35.7)
MCV: 92 fL (ref 79–97)
Monocytes Absolute: 0.6 x10E3/uL (ref 0.1–0.9)
Monocytes: 8 %
Neutrophils Absolute: 5.3 x10E3/uL (ref 1.4–7.0)
Neutrophils: 66 %
Platelets: 296 x10E3/uL (ref 150–450)
RBC: 4.16 x10E6/uL (ref 3.77–5.28)
RDW: 13.6 % (ref 11.7–15.4)
WBC: 8 x10E3/uL (ref 3.4–10.8)

## 2024-05-14 LAB — COMPREHENSIVE METABOLIC PANEL WITH GFR
ALT: 21 IU/L (ref 0–32)
AST: 27 IU/L (ref 0–40)
Albumin: 4.3 g/dL (ref 3.8–4.8)
Alkaline Phosphatase: 92 IU/L (ref 49–135)
BUN/Creatinine Ratio: 12 (ref 12–28)
BUN: 10 mg/dL (ref 8–27)
Bilirubin Total: 0.3 mg/dL (ref 0.0–1.2)
CO2: 21 mmol/L (ref 20–29)
Calcium: 8.7 mg/dL (ref 8.7–10.3)
Chloride: 101 mmol/L (ref 96–106)
Creatinine, Ser: 0.84 mg/dL (ref 0.57–1.00)
Globulin, Total: 2.3 g/dL (ref 1.5–4.5)
Glucose: 94 mg/dL (ref 70–99)
Potassium: 4.3 mmol/L (ref 3.5–5.2)
Sodium: 140 mmol/L (ref 134–144)
Total Protein: 6.6 g/dL (ref 6.0–8.5)
eGFR: 72 mL/min/1.73 (ref >59)

## 2024-05-14 LAB — TSH: TSH: 1.61 u[IU]/mL (ref 0.450–4.500)

## 2024-05-14 LAB — URIC ACID: Uric Acid: 5.6 mg/dL (ref 3.1–7.9)

## 2024-05-20 DIAGNOSIS — M955 Acquired deformity of pelvis: Secondary | ICD-10-CM | POA: Diagnosis not present

## 2024-05-20 DIAGNOSIS — M9902 Segmental and somatic dysfunction of thoracic region: Secondary | ICD-10-CM | POA: Diagnosis not present

## 2024-05-20 DIAGNOSIS — M9905 Segmental and somatic dysfunction of pelvic region: Secondary | ICD-10-CM | POA: Diagnosis not present

## 2024-05-20 DIAGNOSIS — M9903 Segmental and somatic dysfunction of lumbar region: Secondary | ICD-10-CM | POA: Diagnosis not present

## 2024-05-20 DIAGNOSIS — M546 Pain in thoracic spine: Secondary | ICD-10-CM | POA: Diagnosis not present

## 2024-05-20 DIAGNOSIS — M5441 Lumbago with sciatica, right side: Secondary | ICD-10-CM | POA: Diagnosis not present

## 2024-06-10 DIAGNOSIS — M546 Pain in thoracic spine: Secondary | ICD-10-CM | POA: Diagnosis not present

## 2024-06-10 DIAGNOSIS — M5441 Lumbago with sciatica, right side: Secondary | ICD-10-CM | POA: Diagnosis not present

## 2024-06-10 DIAGNOSIS — M9903 Segmental and somatic dysfunction of lumbar region: Secondary | ICD-10-CM | POA: Diagnosis not present

## 2024-06-10 DIAGNOSIS — M9902 Segmental and somatic dysfunction of thoracic region: Secondary | ICD-10-CM | POA: Diagnosis not present

## 2024-06-10 DIAGNOSIS — M9905 Segmental and somatic dysfunction of pelvic region: Secondary | ICD-10-CM | POA: Diagnosis not present

## 2024-06-10 DIAGNOSIS — M955 Acquired deformity of pelvis: Secondary | ICD-10-CM | POA: Diagnosis not present

## 2024-06-23 DIAGNOSIS — L578 Other skin changes due to chronic exposure to nonionizing radiation: Secondary | ICD-10-CM | POA: Diagnosis not present

## 2024-06-23 DIAGNOSIS — Z872 Personal history of diseases of the skin and subcutaneous tissue: Secondary | ICD-10-CM | POA: Diagnosis not present

## 2024-06-23 DIAGNOSIS — L905 Scar conditions and fibrosis of skin: Secondary | ICD-10-CM | POA: Diagnosis not present

## 2024-06-23 DIAGNOSIS — Z859 Personal history of malignant neoplasm, unspecified: Secondary | ICD-10-CM | POA: Diagnosis not present

## 2024-09-09 ENCOUNTER — Other Ambulatory Visit: Payer: Self-pay | Admitting: Family Medicine

## 2024-09-09 DIAGNOSIS — F411 Generalized anxiety disorder: Secondary | ICD-10-CM

## 2024-09-09 NOTE — Telephone Encounter (Signed)
6 month supply called in in September- should not be due

## 2024-11-11 ENCOUNTER — Ambulatory Visit: Admitting: Family Medicine

## 2024-12-09 ENCOUNTER — Ambulatory Visit: Admitting: Cardiovascular Disease
